# Patient Record
Sex: Male | Born: 1945 | Race: White | Hispanic: No | Smoking: Former smoker
Health system: Southern US, Community
[De-identification: ages and names within clinical notes are randomized; demographics above are authoritative.]

## PROBLEM LIST (undated history)

## (undated) DIAGNOSIS — J449 Chronic obstructive pulmonary disease, unspecified: Secondary | ICD-10-CM

## (undated) DIAGNOSIS — K449 Diaphragmatic hernia without obstruction or gangrene: Secondary | ICD-10-CM

## (undated) DIAGNOSIS — M109 Gout, unspecified: Secondary | ICD-10-CM

## (undated) DIAGNOSIS — I509 Heart failure, unspecified: Secondary | ICD-10-CM

## (undated) DIAGNOSIS — I5189 Other ill-defined heart diseases: Secondary | ICD-10-CM

## (undated) DIAGNOSIS — H9190 Unspecified hearing loss, unspecified ear: Secondary | ICD-10-CM

## (undated) DIAGNOSIS — R32 Unspecified urinary incontinence: Secondary | ICD-10-CM

## (undated) DIAGNOSIS — C8590 Non-Hodgkin lymphoma, unspecified, unspecified site: Secondary | ICD-10-CM

## (undated) DIAGNOSIS — I10 Essential (primary) hypertension: Secondary | ICD-10-CM

## (undated) HISTORY — DX: Unspecified hearing loss, unspecified ear: H91.90

## (undated) HISTORY — DX: Unspecified urinary incontinence: R32

## (undated) HISTORY — DX: Diaphragmatic hernia without obstruction or gangrene: K44.9

## (undated) HISTORY — PX: OTHER SURGICAL HISTORY: SHX169

## (undated) HISTORY — PX: TONSILLECTOMY: SUR1361

## (undated) HISTORY — DX: Heart failure, unspecified: I50.9

---

## 2013-09-18 ENCOUNTER — Emergency Department: Payer: Self-pay | Admitting: Emergency Medicine

## 2013-09-18 LAB — URINALYSIS, COMPLETE
Bacteria: NONE SEEN
Bilirubin,UR: NEGATIVE
GLUCOSE, UR: NEGATIVE mg/dL (ref 0–75)
Ketone: NEGATIVE
LEUKOCYTE ESTERASE: NEGATIVE
Nitrite: NEGATIVE
PH: 5 (ref 4.5–8.0)
Protein: NEGATIVE
RBC,UR: 4 /HPF (ref 0–5)
SQUAMOUS EPITHELIAL: NONE SEEN
Specific Gravity: 1.01 (ref 1.003–1.030)

## 2013-09-18 LAB — CBC
HCT: 39.3 % — ABNORMAL LOW (ref 40.0–52.0)
HGB: 13.8 g/dL (ref 13.0–18.0)
MCH: 31 pg (ref 26.0–34.0)
MCHC: 35.1 g/dL (ref 32.0–36.0)
MCV: 89 fL (ref 80–100)
Platelet: 135 10*3/uL — ABNORMAL LOW (ref 150–440)
RBC: 4.44 10*6/uL (ref 4.40–5.90)
RDW: 13.4 % (ref 11.5–14.5)
WBC: 6.6 10*3/uL (ref 3.8–10.6)

## 2013-09-18 LAB — RAPID INFLUENZA A&B ANTIGENS (ARMC ONLY)

## 2013-09-18 LAB — BASIC METABOLIC PANEL
ANION GAP: 6 — AB (ref 7–16)
BUN: 15 mg/dL (ref 7–18)
Calcium, Total: 9.1 mg/dL (ref 8.5–10.1)
Chloride: 98 mmol/L (ref 98–107)
Co2: 29 mmol/L (ref 21–32)
Creatinine: 1.25 mg/dL (ref 0.60–1.30)
EGFR (Non-African Amer.): 59 — ABNORMAL LOW
Glucose: 97 mg/dL (ref 65–99)
Osmolality: 267 (ref 275–301)
POTASSIUM: 3.1 mmol/L — AB (ref 3.5–5.1)
SODIUM: 133 mmol/L — AB (ref 136–145)

## 2013-09-18 LAB — TROPONIN I

## 2013-09-28 ENCOUNTER — Ambulatory Visit: Payer: Self-pay | Admitting: Internal Medicine

## 2014-08-19 DIAGNOSIS — I10 Essential (primary) hypertension: Secondary | ICD-10-CM | POA: Diagnosis not present

## 2014-08-19 DIAGNOSIS — M1990 Unspecified osteoarthritis, unspecified site: Secondary | ICD-10-CM | POA: Diagnosis not present

## 2014-08-19 DIAGNOSIS — E780 Pure hypercholesterolemia: Secondary | ICD-10-CM | POA: Diagnosis not present

## 2014-08-26 DIAGNOSIS — E780 Pure hypercholesterolemia: Secondary | ICD-10-CM | POA: Diagnosis not present

## 2014-08-26 DIAGNOSIS — I10 Essential (primary) hypertension: Secondary | ICD-10-CM | POA: Diagnosis not present

## 2014-08-26 DIAGNOSIS — M549 Dorsalgia, unspecified: Secondary | ICD-10-CM | POA: Diagnosis not present

## 2014-08-26 DIAGNOSIS — G4700 Insomnia, unspecified: Secondary | ICD-10-CM | POA: Diagnosis not present

## 2014-08-26 DIAGNOSIS — M1990 Unspecified osteoarthritis, unspecified site: Secondary | ICD-10-CM | POA: Diagnosis not present

## 2014-08-26 DIAGNOSIS — E785 Hyperlipidemia, unspecified: Secondary | ICD-10-CM | POA: Diagnosis not present

## 2014-09-19 DIAGNOSIS — M1990 Unspecified osteoarthritis, unspecified site: Secondary | ICD-10-CM | POA: Diagnosis not present

## 2014-09-19 DIAGNOSIS — M11261 Other chondrocalcinosis, right knee: Secondary | ICD-10-CM | POA: Diagnosis not present

## 2014-09-19 DIAGNOSIS — M25461 Effusion, right knee: Secondary | ICD-10-CM | POA: Diagnosis not present

## 2014-09-19 DIAGNOSIS — E780 Pure hypercholesterolemia: Secondary | ICD-10-CM | POA: Diagnosis not present

## 2014-09-19 DIAGNOSIS — I10 Essential (primary) hypertension: Secondary | ICD-10-CM | POA: Diagnosis not present

## 2014-12-23 ENCOUNTER — Other Ambulatory Visit: Payer: Self-pay

## 2014-12-23 DIAGNOSIS — C9140 Hairy cell leukemia not having achieved remission: Secondary | ICD-10-CM

## 2014-12-24 ENCOUNTER — Encounter (INDEPENDENT_AMBULATORY_CARE_PROVIDER_SITE_OTHER): Payer: Self-pay

## 2014-12-24 ENCOUNTER — Inpatient Hospital Stay: Payer: 59

## 2014-12-24 ENCOUNTER — Inpatient Hospital Stay: Payer: 59 | Admitting: Hematology and Oncology

## 2015-03-04 DIAGNOSIS — M159 Polyosteoarthritis, unspecified: Secondary | ICD-10-CM | POA: Diagnosis not present

## 2015-03-04 DIAGNOSIS — F5104 Psychophysiologic insomnia: Secondary | ICD-10-CM | POA: Diagnosis not present

## 2015-03-04 DIAGNOSIS — I10 Essential (primary) hypertension: Secondary | ICD-10-CM | POA: Diagnosis not present

## 2015-03-04 DIAGNOSIS — R21 Rash and other nonspecific skin eruption: Secondary | ICD-10-CM | POA: Diagnosis not present

## 2015-05-29 DIAGNOSIS — K029 Dental caries, unspecified: Secondary | ICD-10-CM | POA: Diagnosis not present

## 2015-09-09 DIAGNOSIS — Z23 Encounter for immunization: Secondary | ICD-10-CM | POA: Diagnosis not present

## 2015-09-09 DIAGNOSIS — F5104 Psychophysiologic insomnia: Secondary | ICD-10-CM | POA: Diagnosis not present

## 2015-09-09 DIAGNOSIS — I10 Essential (primary) hypertension: Secondary | ICD-10-CM | POA: Diagnosis not present

## 2015-09-09 DIAGNOSIS — Z1211 Encounter for screening for malignant neoplasm of colon: Secondary | ICD-10-CM | POA: Diagnosis not present

## 2015-09-09 DIAGNOSIS — M1990 Unspecified osteoarthritis, unspecified site: Secondary | ICD-10-CM | POA: Diagnosis not present

## 2015-09-09 DIAGNOSIS — E7800 Pure hypercholesterolemia, unspecified: Secondary | ICD-10-CM | POA: Diagnosis not present

## 2015-09-09 DIAGNOSIS — Z299 Encounter for prophylactic measures, unspecified: Secondary | ICD-10-CM | POA: Diagnosis not present

## 2015-12-18 DIAGNOSIS — H6123 Impacted cerumen, bilateral: Secondary | ICD-10-CM | POA: Diagnosis not present

## 2015-12-18 DIAGNOSIS — H903 Sensorineural hearing loss, bilateral: Secondary | ICD-10-CM | POA: Diagnosis not present

## 2016-03-24 DIAGNOSIS — F5104 Psychophysiologic insomnia: Secondary | ICD-10-CM | POA: Diagnosis not present

## 2016-03-24 DIAGNOSIS — M1990 Unspecified osteoarthritis, unspecified site: Secondary | ICD-10-CM | POA: Diagnosis not present

## 2016-03-24 DIAGNOSIS — Z299 Encounter for prophylactic measures, unspecified: Secondary | ICD-10-CM | POA: Diagnosis not present

## 2016-03-24 DIAGNOSIS — Z125 Encounter for screening for malignant neoplasm of prostate: Secondary | ICD-10-CM | POA: Diagnosis not present

## 2016-03-24 DIAGNOSIS — E7800 Pure hypercholesterolemia, unspecified: Secondary | ICD-10-CM | POA: Diagnosis not present

## 2016-03-24 DIAGNOSIS — I10 Essential (primary) hypertension: Secondary | ICD-10-CM | POA: Diagnosis not present

## 2016-03-24 DIAGNOSIS — Z23 Encounter for immunization: Secondary | ICD-10-CM | POA: Diagnosis not present

## 2016-03-31 DIAGNOSIS — M1990 Unspecified osteoarthritis, unspecified site: Secondary | ICD-10-CM | POA: Diagnosis not present

## 2016-03-31 DIAGNOSIS — I10 Essential (primary) hypertension: Secondary | ICD-10-CM | POA: Diagnosis not present

## 2016-03-31 DIAGNOSIS — E7800 Pure hypercholesterolemia, unspecified: Secondary | ICD-10-CM | POA: Diagnosis not present

## 2016-03-31 DIAGNOSIS — Z0000 Encounter for general adult medical examination without abnormal findings: Secondary | ICD-10-CM | POA: Diagnosis not present

## 2016-03-31 DIAGNOSIS — F5104 Psychophysiologic insomnia: Secondary | ICD-10-CM | POA: Diagnosis not present

## 2016-06-18 ENCOUNTER — Telehealth: Payer: Self-pay | Admitting: *Deleted

## 2016-06-18 NOTE — Telephone Encounter (Signed)
Opened in error

## 2016-09-24 DIAGNOSIS — I10 Essential (primary) hypertension: Secondary | ICD-10-CM | POA: Diagnosis not present

## 2016-09-24 DIAGNOSIS — F5104 Psychophysiologic insomnia: Secondary | ICD-10-CM | POA: Diagnosis not present

## 2016-09-24 DIAGNOSIS — Z0000 Encounter for general adult medical examination without abnormal findings: Secondary | ICD-10-CM | POA: Diagnosis not present

## 2016-09-24 DIAGNOSIS — E7800 Pure hypercholesterolemia, unspecified: Secondary | ICD-10-CM | POA: Diagnosis not present

## 2016-09-24 DIAGNOSIS — M1990 Unspecified osteoarthritis, unspecified site: Secondary | ICD-10-CM | POA: Diagnosis not present

## 2016-10-04 DIAGNOSIS — I10 Essential (primary) hypertension: Secondary | ICD-10-CM | POA: Diagnosis not present

## 2016-10-04 DIAGNOSIS — E7800 Pure hypercholesterolemia, unspecified: Secondary | ICD-10-CM | POA: Diagnosis not present

## 2016-10-04 DIAGNOSIS — Z0000 Encounter for general adult medical examination without abnormal findings: Secondary | ICD-10-CM | POA: Diagnosis not present

## 2016-10-04 DIAGNOSIS — F5104 Psychophysiologic insomnia: Secondary | ICD-10-CM | POA: Diagnosis not present

## 2016-10-04 DIAGNOSIS — M1990 Unspecified osteoarthritis, unspecified site: Secondary | ICD-10-CM | POA: Diagnosis not present

## 2016-10-12 DIAGNOSIS — M79674 Pain in right toe(s): Secondary | ICD-10-CM | POA: Diagnosis not present

## 2016-10-12 DIAGNOSIS — M2042 Other hammer toe(s) (acquired), left foot: Secondary | ICD-10-CM | POA: Diagnosis not present

## 2016-10-12 DIAGNOSIS — M898X9 Other specified disorders of bone, unspecified site: Secondary | ICD-10-CM | POA: Diagnosis not present

## 2016-10-12 DIAGNOSIS — M79675 Pain in left toe(s): Secondary | ICD-10-CM | POA: Diagnosis not present

## 2016-10-12 DIAGNOSIS — M2041 Other hammer toe(s) (acquired), right foot: Secondary | ICD-10-CM | POA: Diagnosis not present

## 2016-10-12 DIAGNOSIS — B351 Tinea unguium: Secondary | ICD-10-CM | POA: Diagnosis not present

## 2017-02-23 ENCOUNTER — Other Ambulatory Visit: Payer: Self-pay | Admitting: Internal Medicine

## 2017-02-23 ENCOUNTER — Ambulatory Visit
Admission: RE | Admit: 2017-02-23 | Discharge: 2017-02-23 | Disposition: A | Discharge status: Home / Self Care | Payer: Medicare HMO | Source: Ambulatory Visit | Attending: Internal Medicine | Admitting: Internal Medicine

## 2017-02-23 DIAGNOSIS — R42 Dizziness and giddiness: Secondary | ICD-10-CM

## 2017-02-23 DIAGNOSIS — R2681 Unsteadiness on feet: Secondary | ICD-10-CM

## 2017-02-23 DIAGNOSIS — I6782 Cerebral ischemia: Secondary | ICD-10-CM | POA: Diagnosis not present

## 2017-02-23 DIAGNOSIS — R531 Weakness: Secondary | ICD-10-CM

## 2017-02-23 DIAGNOSIS — I10 Essential (primary) hypertension: Secondary | ICD-10-CM | POA: Diagnosis not present

## 2017-02-23 DIAGNOSIS — R739 Hyperglycemia, unspecified: Secondary | ICD-10-CM | POA: Diagnosis not present

## 2017-02-23 DIAGNOSIS — R634 Abnormal weight loss: Secondary | ICD-10-CM | POA: Diagnosis not present

## 2017-02-23 DIAGNOSIS — E7800 Pure hypercholesterolemia, unspecified: Secondary | ICD-10-CM | POA: Diagnosis not present

## 2017-02-23 DIAGNOSIS — R5383 Other fatigue: Secondary | ICD-10-CM | POA: Diagnosis not present

## 2017-02-28 DIAGNOSIS — E876 Hypokalemia: Secondary | ICD-10-CM | POA: Diagnosis not present

## 2017-02-28 DIAGNOSIS — L0291 Cutaneous abscess, unspecified: Secondary | ICD-10-CM | POA: Diagnosis not present

## 2017-03-08 DIAGNOSIS — E876 Hypokalemia: Secondary | ICD-10-CM | POA: Diagnosis not present

## 2017-03-15 DIAGNOSIS — D649 Anemia, unspecified: Secondary | ICD-10-CM | POA: Diagnosis not present

## 2017-03-15 DIAGNOSIS — E538 Deficiency of other specified B group vitamins: Secondary | ICD-10-CM | POA: Diagnosis not present

## 2017-03-21 ENCOUNTER — Ambulatory Visit: Payer: Self-pay | Admitting: Urology

## 2017-04-04 NOTE — Progress Notes (Signed)
04/05/2017 3:47 PM   Waldron Labs Gladman Dec 30, 1945 [id number]  Referring provider: Tracie Harrier, MD [address] [facility], [place] [address]  Chief Complaint  Patient presents with  . New Patient (Initial Visit)    Hematuria referred by Dr. Ginette Pitman    HPI: Patient is a 71 -year-old Caucasian male who presents today as a referral from their PCP, Dr. Ginette Pitman, for microscopic hematuria.    Patient was found to have microscopic hematuria on 02/23/2017 with 4-10 RBC's/hpf.  Urine culture was negative.  Patient doesn't have a prior history of microscopic hematuria.    He does not have a prior history of recurrent urinary tract infections, nephrolithiasis, trauma to the genitourinary tract, BPH or malignancies of the genitourinary tract.   He does not have a family medical history of nephrolithiasis, malignancies of the genitourinary tract or hematuria.   Today, he is having symptoms of nocturia.  His UA today is unremarkable.   He is not experiencing any suprapubic pain, abdominal pain or flank pain. He denies any recent fevers, chills, nausea or vomiting.   He has not had any recent imaging studies.   He is smoker.   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 He was also exposed to agent orange in [place].  He has HTN.     PMH: Past Medical History:  Diagnosis Date  . 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 right knne surgery     needle went through knee    Home Medications:  Allergies as of 04/05/2017   No Known Allergies     Medication List       Accurate as of 04/05/17  3:47 PM. Always use your most recent med list.          allopurinol 100 MG tablet Commonly known as:  ZYLOPRIM Take by mouth.   amLODipine 10 MG tablet Commonly known as:  NORVASC Take by mouth.     etodolac 500 MG tablet Commonly known as:  LODINE Take by mouth.   hydrALAZINE 25 MG tablet Commonly known as:  APRESOLINE Take by mouth.   losartan 100 MG tablet Commonly known as:  COZAAR Take by mouth.   potassium chloride SA 20 MEQ tablet Commonly known as:  K-DUR,KLOR-CON Take by mouth.   pravastatin 80 MG tablet Commonly known as:  PRAVACHOL Take by mouth.   zolpidem 10 MG tablet Commonly known as:  AMBIEN Take by mouth.            Discharge Care Instructions        Start     Ordered   04/05/17 0000  Urinalysis, Complete     04/05/17 1511   04/05/17 0000  CULTURE, URINE COMPREHENSIVE     04/05/17 1511   04/05/17 0000  BUN+Creat     04/05/17 1511   04/05/17 0000  CT HEMATURIA WORKUP    Question Answer Comment  Reason for Exam (SYMPTOM  OR DIAGNOSIS REQUIRED) microscopic hematuria   Preferred imaging location? [facility]      04/05/17 1547      Allergies: No Known Allergies  Family History: Family History  Problem Relation Age of Onset  . Pancreatic cancer Mother   . Aneurysm Mother   . Lung cancer Father   . Prostate cancer Neg  Hx   . Kidney cancer Neg Hx   . Bladder Cancer Neg Hx     Social History:  reports that he has been smoking.  He has never used smokeless tobacco. He reports that he drinks alcohol. He reports that he does not use drugs.  ROS: UROLOGY Frequent Urination?: No Hard to postpone urination?: No Burning/pain with urination?: No Get up at night to urinate?: Yes Leakage of urine?: No Urine stream starts and stops?: No Trouble starting stream?: No Do you have to strain to urinate?: No Blood in urine?: No Urinary tract infection?: No Sexually transmitted disease?: No Injury to kidneys or bladder?: No Painful intercourse?: No Weak stream?: No Erection problems?: No Penile pain?: No  Gastrointestinal Nausea?: No Vomiting?: No Indigestion/heartburn?: No Diarrhea?: No Constipation?:  No  Constitutional Fever: No Night sweats?: No Weight loss?: No Fatigue?: No  Skin Skin rash/lesions?: No Itching?: No  Eyes Blurred vision?: No Double vision?: No  Ears/Nose/Throat Sore throat?: No Sinus problems?: No  Hematologic/Lymphatic Swollen glands?: No Easy bruising?: No  Cardiovascular Leg swelling?: No Chest pain?: No  Respiratory Cough?: No Shortness of breath?: No  Endocrine Excessive thirst?: No  Musculoskeletal Back pain?: No Joint pain?: Yes  Neurological Headaches?: No Dizziness?: No  Psychologic Depression?: No Anxiety?: No  Physical Exam: BP (!) 161/88   Pulse 60   Ht 6\' 1"  (1.854 m)   Wt 225 lb 3.2 oz (102.2 kg)   BMI 29.71 kg/m   Constitutional: Well nourished. Alert and oriented, No acute distress. HEENT: [place] AT, moist mucus membranes. Trachea midline, no masses. Cardiovascular: No clubbing, cyanosis, or edema. Respiratory: Normal respiratory effort, no increased work of breathing. GI: Abdomen is soft, non tender, non distended, no abdominal masses. Liver and spleen not palpable.  No hernias appreciated.  Stool sample for occult testing is not indicated.  Umbilical hernia.   GU: No CVA tenderness.  No bladder fullness or masses.  Patient with circumcised phallus.  Urethral meatus is patent.  No penile discharge. No penile lesions or rashes. Scrotum without lesions, cysts, rashes and/or edema.  Testicles are located scrotally bilaterally. No masses are appreciated in the testicles. Left and right epididymis are normal. Rectal: Patient with  normal sphincter tone. Anus and perineum without scarring or rashes. No rectal masses are appreciated. Prostate is approximately 60 grams, no nodules are appreciated. Seminal vesicles are normal. Skin: No rashes, bruises or suspicious lesions. Lymph: No cervical or inguinal adenopathy. Neurologic: Grossly intact, no focal deficits, moving all 4 extremities. Psychiatric: Normal mood and  affect.  Laboratory Data: PSA  2.48 ng/mL on 03/25/2017    Urinalysis Unremarkable.  See EPIC.    Assessment & Plan:    1. Microscopic hematuria  - I explained to the patient that there are a number of causes that can be associated with blood in the urine, such as stones, BPH, UTI's, damage to the urinary tract and/or cancer.  - At this time, I felt that the patient warranted further urologic evaluation.   The AUA guidelines state that a CT urogram is the preferred imaging study to evaluate hematuria.  - I explained to the patient that a contrast material will be injected into a vein and that in rare instances, an allergic reaction can result and may even life threatening   The patient denies any allergies to contrast, iodine and/or seafood and is not taking metformin.  - Following the imaging study,  I've recommended a cystoscopy. I described how this is performed, typically  in an office setting with a flexible cystoscope. We described the risks, benefits, and possible side effects, the most common of which is a minor amount of blood in the urine and/or burning which usually resolves in 24 to 48 hours. - patient would like to think about  - The patient had the opportunity to ask questions which were answered. Based upon this discussion, the patient is willing to proceed. Therefore, I've ordered: a CT Urogram and cystoscopy.  - The patient will return following all of the above for discussion of the results.   - UA  - Urine culture  - BUN + creatinine     Return for CT Urogram report and cystoscopy.  These notes generated with voice recognition software. I apologize for typographical errors.  Zara Council, [facility] [address] [place], [place] [address] [phone]

## 2017-04-05 ENCOUNTER — Encounter: Payer: Self-pay | Admitting: Urology

## 2017-04-05 ENCOUNTER — Ambulatory Visit (INDEPENDENT_AMBULATORY_CARE_PROVIDER_SITE_OTHER): Payer: Medicare HMO | Admitting: Urology

## 2017-04-05 VITALS — BP 161/88 | HR 60 | Ht 73.0 in | Wt 225.2 lb

## 2017-04-05 DIAGNOSIS — R3129 Other microscopic hematuria: Secondary | ICD-10-CM | POA: Diagnosis not present

## 2017-04-05 LAB — URINALYSIS, COMPLETE
Bilirubin, UA: NEGATIVE
Glucose, UA: NEGATIVE
Ketones, UA: NEGATIVE
Leukocytes, UA: NEGATIVE
Nitrite, UA: NEGATIVE
PH UA: 5.5 (ref 5.0–7.5)
Protein, UA: NEGATIVE
RBC, UA: NEGATIVE
Specific Gravity, UA: 1.015 (ref 1.005–1.030)
UUROB: 0.2 mg/dL (ref 0.2–1.0)

## 2017-04-05 LAB — MICROSCOPIC EXAMINATION
Bacteria, UA: NONE SEEN
EPITHELIAL CELLS (NON RENAL): NONE SEEN /HPF (ref 0–10)

## 2017-04-06 LAB — BUN+CREAT
BUN / CREAT RATIO: 13 (ref 10–24)
BUN: 16 mg/dL (ref 8–27)
CREATININE: 1.22 mg/dL (ref 0.76–1.27)
GFR, EST AFRICAN AMERICAN: 69 mL/min/{1.73_m2} (ref >59)
GFR, EST NON AFRICAN AMERICAN: 59 mL/min/{1.73_m2} — AB (ref >59)

## 2017-04-08 LAB — CULTURE, URINE COMPREHENSIVE

## 2017-04-09 DIAGNOSIS — C8590 Non-Hodgkin lymphoma, unspecified, unspecified site: Secondary | ICD-10-CM

## 2017-04-09 HISTORY — DX: Non-Hodgkin lymphoma, unspecified, unspecified site: C85.90

## 2017-04-20 ENCOUNTER — Ambulatory Visit
Admission: RE | Admit: 2017-04-20 | Discharge: 2017-04-20 | Disposition: A | Discharge status: Home / Self Care | Payer: Medicare HMO | Source: Ambulatory Visit | Attending: Urology | Admitting: Urology

## 2017-04-20 DIAGNOSIS — N1330 Unspecified hydronephrosis: Secondary | ICD-10-CM | POA: Diagnosis not present

## 2017-04-20 DIAGNOSIS — N289 Disorder of kidney and ureter, unspecified: Secondary | ICD-10-CM | POA: Diagnosis not present

## 2017-04-20 DIAGNOSIS — I700 Atherosclerosis of aorta: Secondary | ICD-10-CM | POA: Insufficient documentation

## 2017-04-20 DIAGNOSIS — R3129 Other microscopic hematuria: Secondary | ICD-10-CM

## 2017-04-20 DIAGNOSIS — J90 Pleural effusion, not elsewhere classified: Secondary | ICD-10-CM | POA: Diagnosis not present

## 2017-04-20 DIAGNOSIS — I2510 Atherosclerotic heart disease of native coronary artery without angina pectoris: Secondary | ICD-10-CM | POA: Insufficient documentation

## 2017-04-20 DIAGNOSIS — K8020 Calculus of gallbladder without cholecystitis without obstruction: Secondary | ICD-10-CM | POA: Diagnosis not present

## 2017-04-20 DIAGNOSIS — D3502 Benign neoplasm of left adrenal gland: Secondary | ICD-10-CM | POA: Insufficient documentation

## 2017-04-20 HISTORY — DX: Essential (primary) hypertension: I10

## 2017-04-20 MED ORDER — IOPAMIDOL (ISOVUE-300) INJECTION 61%
125.0000 mL | Freq: Once | INTRAVENOUS | Status: AC | PRN
  Administered 2017-04-20: 125 mL via INTRAVENOUS

## 2017-04-25 ENCOUNTER — Other Ambulatory Visit: Payer: Self-pay

## 2017-05-08 ENCOUNTER — Telehealth: Payer: Self-pay | Admitting: Urology

## 2017-05-08 NOTE — Telephone Encounter (Signed)
I will be trying to contact Mr. John Gallegos first thing Monday am (05/11/2017).  If I am unsuccessful, we need to send him a certified letter.  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  If he contacts the office, he needs to be schedule with one of the physicians A.S.A.P to discuss his CT findings and the next steps.

## 2017-05-09 ENCOUNTER — Telehealth: Payer: Self-pay | Admitting: Urology

## 2017-05-09 NOTE — Telephone Encounter (Signed)
Would you call Dr. Linton Ham office and see if they any other phone numbers for this patient?  The [phone] does not have a voice mail set up.

## 2017-05-09 NOTE — Telephone Encounter (Signed)
We were able to make contact with John Gallegos and I informed him that on his CT urogram there were findings concerning for a left kidney cancer and it was important for him to return for his appointments. He'll be scheduled in our office tomorrow at 2:30 in the afternoon for CT report and cystoscopy.

## 2017-05-09 NOTE — Telephone Encounter (Signed)
Patient was contacted  

## 2017-05-10 ENCOUNTER — Ambulatory Visit (INDEPENDENT_AMBULATORY_CARE_PROVIDER_SITE_OTHER): Payer: Medicare HMO | Admitting: Urology

## 2017-05-10 ENCOUNTER — Other Ambulatory Visit: Payer: Self-pay | Admitting: Radiology

## 2017-05-10 VITALS — BP 151/81 | HR 66 | Ht 73.0 in | Wt 222.8 lb

## 2017-05-10 DIAGNOSIS — R319 Hematuria, unspecified: Secondary | ICD-10-CM | POA: Diagnosis not present

## 2017-05-10 DIAGNOSIS — I10 Essential (primary) hypertension: Secondary | ICD-10-CM | POA: Insufficient documentation

## 2017-05-10 DIAGNOSIS — E785 Hyperlipidemia, unspecified: Secondary | ICD-10-CM | POA: Insufficient documentation

## 2017-05-10 DIAGNOSIS — R3129 Other microscopic hematuria: Secondary | ICD-10-CM

## 2017-05-10 DIAGNOSIS — G4700 Insomnia, unspecified: Secondary | ICD-10-CM | POA: Insufficient documentation

## 2017-05-10 DIAGNOSIS — D4122 Neoplasm of uncertain behavior of left ureter: Secondary | ICD-10-CM

## 2017-05-10 DIAGNOSIS — M1990 Unspecified osteoarthritis, unspecified site: Secondary | ICD-10-CM | POA: Insufficient documentation

## 2017-05-10 LAB — URINALYSIS, COMPLETE
BILIRUBIN UA: NEGATIVE
GLUCOSE, UA: NEGATIVE
Ketones, UA: NEGATIVE
LEUKOCYTES UA: NEGATIVE
Nitrite, UA: NEGATIVE
PROTEIN UA: NEGATIVE
RBC, UA: NEGATIVE
Specific Gravity, UA: 1.01 (ref 1.005–1.030)
Urobilinogen, Ur: 0.2 mg/dL (ref 0.2–1.0)
pH, UA: 7 (ref 5.0–7.5)

## 2017-05-10 MED ORDER — CIPROFLOXACIN HCL 500 MG PO TABS
500.0000 mg | ORAL_TABLET | Freq: Once | ORAL | Status: AC
  Administered 2017-05-10: 500 mg via ORAL

## 2017-05-10 MED ORDER — LIDOCAINE HCL 2 % EX GEL
1.0000 "application " | Freq: Once | CUTANEOUS | Status: AC
  Administered 2017-05-10: 1 via URETHRAL

## 2017-05-10 NOTE — Progress Notes (Signed)
   05/10/17  CC:  Chief Complaint  Patient presents with  . Cysto    HPI:  Follow-up for microscopic hematuria. He underwent a CT scan 04/20/2017 which revealed moderate left hydronephrosis and delayed excretion down to a large 4.4 cm mass in or around the left proximal ureter. There was also hazy soft tissue in the fat surrounding the celiac axis and superior mesenteric artery. Thickening of the right diaphragmatic crus. High gastrohepatic ligament lymphadenopathy measures up to 11 mm. Peripancreatic and root of small bowel mesentery adenopathy measures up to 2.6 cm (series 7, images 26 and 36). The patient does endorse weight loss.   He does not have cancer or Hairy cell leukemia ([place]) -- this is his son. His urine is clear today.   He is smoker.   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 He was also exposed to agent orange in [place].  He has HTN.  Blood pressure (!) 151/81, pulse 66, height 6\' 1"  (1.854 m), weight 101.1 kg (222 lb 12.8 oz). NED. A&Ox3.   No respiratory distress   Abd soft, NT, ND Normal phallus with bilateral descended testicles  Cystoscopy Procedure Note  Patient identification was confirmed, informed consent was obtained, and patient was prepped using Betadine solution.  Lidocaine jelly was administered per urethral meatus.    Preoperative abx where received prior to procedure.     Pre-Procedure: - Inspection reveals a normal caliber ureteral meatus.  Procedure: The flexible cystoscope was introduced without difficulty - No urethral strictures/lesions are present. 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 His urine is clear today. I drew patient a picture the anatomy and discussed we are not sure if this mass is in the ureter or around it. It is causing some hydronephrosis and delayed excretion. We'll plan to do a cystoscopy with left retrograde pyelogram, left ureteroscopy biopsy and stent. We discussed he may need a staged procedure and he may need retroperitoneal biopsy. I discussed one of my colleagues would be doing the procedure and risk of bleeding, infection and ureteral injury among others.   Dr. Ginette Pitman has referred the patient to oncology. If urologic eval is equivocal we can discuss with oncology and IR to determine what might be best to biopsy.

## 2017-05-12 ENCOUNTER — Encounter
Admission: RE | Admit: 2017-05-12 | Discharge: 2017-05-12 | Disposition: A | Discharge status: Home / Self Care | Payer: Medicare HMO | Source: Ambulatory Visit | Attending: Urology | Admitting: Urology

## 2017-05-12 DIAGNOSIS — I10 Essential (primary) hypertension: Secondary | ICD-10-CM | POA: Diagnosis not present

## 2017-05-12 DIAGNOSIS — M1990 Unspecified osteoarthritis, unspecified site: Secondary | ICD-10-CM | POA: Diagnosis not present

## 2017-05-12 DIAGNOSIS — N1330 Unspecified hydronephrosis: Secondary | ICD-10-CM | POA: Diagnosis not present

## 2017-05-12 DIAGNOSIS — Z79899 Other long term (current) drug therapy: Secondary | ICD-10-CM | POA: Diagnosis not present

## 2017-05-12 HISTORY — DX: Gout, unspecified: M10.9

## 2017-05-12 MED ORDER — CEFAZOLIN SODIUM-DEXTROSE 2-4 GM/100ML-% IV SOLN
2.0000 g | INTRAVENOUS | Status: AC
  Administered 2017-05-13: 2 g via INTRAVENOUS

## 2017-05-12 NOTE — Patient Instructions (Signed)
Your procedure is scheduled on: Friday 05/13/17 Report to [facility]. 2ND FLOOR MEDICAL MALL ENTRANCE. To find out your arrival time please call [phone] between 1PM - 3PM on Today.  Remember: Instructions that are not followed completely may result in serious medical risk, up to and including death, or upon the discretion of your surgeon and anesthesiologist your surgery may need to be rescheduled.    __X__ 1. Do not eat anything after midnight the night before your    procedure.  No gum chewing or hard candies.  You may drink clear   liquids up to 2 hours before you are scheduled to arrive at the   hospital for your procedure. Do not drink clear liquids within 2   hours of scheduled arrival to the hospital as this may lead to your   procedure being delayed or rescheduled.       Clear liquids include:   Water or Apple juice without pulp   Clear carbohydrate beverage such as Clearfast or Gatorade   Black coffee or Clear Tea (no milk, no creamer, do not add anything   to the coffee or tea)    Diabetics should only drink water   __X__ 2. No Alcohol for 24 hours before or after surgery.   ____ 3. Bring all medications with you on the day of surgery if instructed.    __X__ 4. Notify your doctor if there is any change in your medical condition     (cold, fever, infections).             __X___5. No smoking within 24 hours of your surgery.     Do not wear jewelry, make-up, hairpins, clips or nail polish.  Do not wear lotions, powders, or perfumes.   Do not shave 48 hours prior to surgery. Men may shave face and neck.  Do not bring valuables to the hospital.    [facility] is not responsible for any belongings or valuables.               Contacts, dentures or bridgework may not be worn into surgery.  Leave your suitcase in the car. After surgery it may be brought to your room.  For patients admitted to the hospital, discharge time is determined by your                treatment  team.   Patients discharged the day of surgery will not be allowed to drive home.   Please read over the following fact sheets that you were given:   MRSA Information   __x__ Take these medicines the morning of surgery with A SIP OF WATER:    1. None, take normal evening medications  2.   3.   4.  5.  6.  ____ Fleet Enema (as directed)   ____ Use CHG Soap/SAGE wipes as directed  ____ Use inhalers on the day of surgery  ____ Stop metformin 2 days prior to surgery    ____ Take 1/2 of usual insulin dose the night before surgery and none on the morning of surgery.   __x__ Stop Coumadin/Plavix/aspirin already stopped   __X__ Stop Anti-inflammatories such as Advil, Aleve, Ibuprofen, Motrin, Naproxen, Naprosyn, Goodies,powder, or aspirin products.  OK to take Tylenol.   __X__ Stop supplements, Vitamin E, Fish Oil until after surgery.    ____ Bring C-Pap to the hospital.

## 2017-05-13 ENCOUNTER — Encounter
Admission: RE | Disposition: A | Discharge status: Home / Self Care | Payer: Self-pay | Source: Ambulatory Visit | Attending: Urology

## 2017-05-13 ENCOUNTER — Ambulatory Visit: Payer: Medicare HMO | Admitting: Registered Nurse

## 2017-05-13 ENCOUNTER — Telehealth: Payer: Self-pay | Admitting: Oncology

## 2017-05-13 ENCOUNTER — Ambulatory Visit
Admission: RE | Admit: 2017-05-13 | Discharge: 2017-05-13 | Disposition: A | Discharge status: Home / Self Care | Payer: Medicare HMO | Source: Ambulatory Visit | Attending: Urology | Admitting: Urology

## 2017-05-13 ENCOUNTER — Encounter: Payer: Self-pay | Admitting: *Deleted

## 2017-05-13 DIAGNOSIS — M1990 Unspecified osteoarthritis, unspecified site: Secondary | ICD-10-CM | POA: Diagnosis not present

## 2017-05-13 DIAGNOSIS — N1330 Unspecified hydronephrosis: Secondary | ICD-10-CM | POA: Insufficient documentation

## 2017-05-13 DIAGNOSIS — Z79899 Other long term (current) drug therapy: Secondary | ICD-10-CM | POA: Insufficient documentation

## 2017-05-13 DIAGNOSIS — I10 Essential (primary) hypertension: Secondary | ICD-10-CM | POA: Diagnosis not present

## 2017-05-13 DIAGNOSIS — D4122 Neoplasm of uncertain behavior of left ureter: Secondary | ICD-10-CM

## 2017-05-13 DIAGNOSIS — D4959 Neoplasm of unspecified behavior of other genitourinary organ: Secondary | ICD-10-CM | POA: Diagnosis not present

## 2017-05-13 HISTORY — PX: URETEROSCOPY: SHX842

## 2017-05-13 HISTORY — PX: CYSTOSCOPY WITH STENT PLACEMENT: SHX5790

## 2017-05-13 HISTORY — PX: CYSTOSCOPY WITH BIOPSY: SHX5122

## 2017-05-13 SURGERY — CYSTOSCOPY, WITH BIOPSY
Anesthesia: General | Laterality: Left | Wound class: Clean Contaminated

## 2017-05-13 MED ORDER — ACETAMINOPHEN 10 MG/ML IV SOLN
INTRAVENOUS | Status: DC | PRN
  Administered 2017-05-13: 1000 mg via INTRAVENOUS

## 2017-05-13 MED ORDER — OXYCODONE HCL 5 MG PO TABS: 5.0000 mg | ORAL_TABLET | Freq: Once | ORAL | Status: DC | PRN

## 2017-05-13 MED ORDER — LIDOCAINE HCL (PF) 2 % IJ SOLN
INTRAMUSCULAR | Status: AC
  Filled 2017-05-13: qty 10

## 2017-05-13 MED ORDER — MIDAZOLAM HCL 2 MG/2ML IJ SOLN
INTRAMUSCULAR | Status: DC | PRN
  Administered 2017-05-13: 2 mg via INTRAVENOUS

## 2017-05-13 MED ORDER — EPHEDRINE SULFATE 50 MG/ML IJ SOLN
INTRAMUSCULAR | Status: AC
  Filled 2017-05-13: qty 1

## 2017-05-13 MED ORDER — ONDANSETRON HCL 4 MG/2ML IJ SOLN
INTRAMUSCULAR | Status: DC | PRN
  Administered 2017-05-13: 4 mg via INTRAVENOUS

## 2017-05-13 MED ORDER — PROPOFOL 10 MG/ML IV BOLUS
INTRAVENOUS | Status: AC
  Filled 2017-05-13: qty 20

## 2017-05-13 MED ORDER — PROPOFOL 10 MG/ML IV BOLUS
INTRAVENOUS | Status: DC | PRN
  Administered 2017-05-13: 180 mg via INTRAVENOUS

## 2017-05-13 MED ORDER — HYDROCODONE-ACETAMINOPHEN 5-325 MG PO TABS: 1.0000 | ORAL_TABLET | ORAL | 0 refills | Status: DC | PRN

## 2017-05-13 MED ORDER — MEPERIDINE HCL 50 MG/ML IJ SOLN: 6.2500 mg | INTRAMUSCULAR | Status: DC | PRN

## 2017-05-13 MED ORDER — CEPHALEXIN 500 MG PO CAPS: 500.0000 mg | ORAL_CAPSULE | Freq: Three times a day (TID) | ORAL | 0 refills | Status: DC

## 2017-05-13 MED ORDER — OXYCODONE HCL 5 MG/5ML PO SOLN: 5.0000 mg | Freq: Once | ORAL | Status: DC | PRN

## 2017-05-13 MED ORDER — GLYCOPYRROLATE 0.2 MG/ML IJ SOLN
INTRAMUSCULAR | Status: DC | PRN
  Administered 2017-05-13: 0.2 mg via INTRAVENOUS

## 2017-05-13 MED ORDER — CEFAZOLIN SODIUM-DEXTROSE 2-4 GM/100ML-% IV SOLN
INTRAVENOUS | Status: AC
  Filled 2017-05-13: qty 100

## 2017-05-13 MED ORDER — MIDAZOLAM HCL 2 MG/2ML IJ SOLN
INTRAMUSCULAR | Status: AC
  Filled 2017-05-13: qty 2

## 2017-05-13 MED ORDER — ONDANSETRON HCL 4 MG/2ML IJ SOLN
INTRAMUSCULAR | Status: AC
  Filled 2017-05-13: qty 2

## 2017-05-13 MED ORDER — FENTANYL CITRATE (PF) 100 MCG/2ML IJ SOLN: 25.0000 ug | INTRAMUSCULAR | Status: DC | PRN

## 2017-05-13 MED ORDER — FENTANYL CITRATE (PF) 100 MCG/2ML IJ SOLN
INTRAMUSCULAR | Status: DC | PRN
  Administered 2017-05-13 (×2): 25 ug via INTRAVENOUS
  Administered 2017-05-13: 50 ug via INTRAVENOUS

## 2017-05-13 MED ORDER — LIDOCAINE HCL (CARDIAC) 20 MG/ML IV SOLN
INTRAVENOUS | Status: DC | PRN
  Administered 2017-05-13: 100 mg via INTRAVENOUS

## 2017-05-13 MED ORDER — LACTATED RINGERS IV SOLN
INTRAVENOUS | Status: DC
  Administered 2017-05-13: 11:00:00 via INTRAVENOUS

## 2017-05-13 MED ORDER — ACETAMINOPHEN 10 MG/ML IV SOLN
INTRAVENOUS | Status: AC
  Filled 2017-05-13: qty 100

## 2017-05-13 MED ORDER — DEXAMETHASONE SODIUM PHOSPHATE 10 MG/ML IJ SOLN
INTRAMUSCULAR | Status: DC | PRN
  Administered 2017-05-13: 10 mg via INTRAVENOUS

## 2017-05-13 MED ORDER — GLYCOPYRROLATE 0.2 MG/ML IJ SOLN
INTRAMUSCULAR | Status: AC
  Filled 2017-05-13: qty 1

## 2017-05-13 MED ORDER — FAMOTIDINE 20 MG PO TABS
ORAL_TABLET | ORAL | Status: AC
  Filled 2017-05-13: qty 1

## 2017-05-13 MED ORDER — EPHEDRINE SULFATE 50 MG/ML IJ SOLN
INTRAMUSCULAR | Status: DC | PRN
  Administered 2017-05-13: 10 mg via INTRAVENOUS

## 2017-05-13 MED ORDER — FENTANYL CITRATE (PF) 100 MCG/2ML IJ SOLN
INTRAMUSCULAR | Status: AC
  Filled 2017-05-13: qty 2

## 2017-05-13 MED ORDER — DEXAMETHASONE SODIUM PHOSPHATE 10 MG/ML IJ SOLN
INTRAMUSCULAR | Status: AC
  Filled 2017-05-13: qty 1

## 2017-05-13 MED ORDER — FAMOTIDINE 20 MG PO TABS
20.0000 mg | ORAL_TABLET | Freq: Once | ORAL | Status: AC
  Administered 2017-05-13: 20 mg via ORAL

## 2017-05-13 MED ORDER — PROMETHAZINE HCL 25 MG/ML IJ SOLN: 6.2500 mg | INTRAMUSCULAR | Status: DC | PRN

## 2017-05-13 SURGICAL SUPPLY — 32 items
BACTOSHIELD CHG 4% 4OZ (MISCELLANEOUS) ×1
BASKET ZERO TIP 1.9FR (BASKET) IMPLANT
CATH URETL 5X70 OPEN END (CATHETERS) ×2 IMPLANT
CNTNR SPEC 2.5X3XGRAD LEK (MISCELLANEOUS) ×1
CONT SPEC 4OZ STER OR WHT (MISCELLANEOUS) ×1
CONTAINER SPEC 2.5X3XGRAD LEK (MISCELLANEOUS) ×1 IMPLANT
FORCEPS BIOP PIRANHA Y (CUTTING FORCEPS) ×2 IMPLANT
GLOVE BIO SURGEON STRL SZ7 (GLOVE) ×4 IMPLANT
GLOVE BIO SURGEON STRL SZ7.5 (GLOVE) ×2 IMPLANT
GOWN L4 LG 24 PK N/S (GOWN DISPOSABLE) ×2 IMPLANT
GOWN STRL REUS W/ TWL LRG LVL4 (GOWN DISPOSABLE) ×1 IMPLANT
GOWN STRL REUS W/ TWL XL LVL3 (GOWN DISPOSABLE) ×1 IMPLANT
GOWN STRL REUS W/TWL LRG LVL4 (GOWN DISPOSABLE) ×1
GOWN STRL REUS W/TWL XL LVL3 (GOWN DISPOSABLE) ×1
GOWN STRL REUS W/TWL XL LVL4 (GOWN DISPOSABLE) ×2 IMPLANT
GUIDEWIRE SUPER STIFF (WIRE) IMPLANT
INTRODUCER DILATOR DOUBLE (INTRODUCER) ×2 IMPLANT
KIT RM TURNOVER CYSTO AR (KITS) ×2 IMPLANT
PACK CYSTO AR (MISCELLANEOUS) ×2 IMPLANT
SCRUB CHG 4% DYNA-HEX 4OZ (MISCELLANEOUS) ×1 IMPLANT
SENSORWIRE 0.038 NOT ANGLED (WIRE) ×2
SET CYSTO W/LG BORE CLAMP LF (SET/KITS/TRAYS/PACK) ×2 IMPLANT
SHEATH URETERAL 13/15X36 1L (SHEATH) IMPLANT
SOL .9 NS 3000ML IRR  AL (IV SOLUTION) ×1
SOL .9 NS 3000ML IRR UROMATIC (IV SOLUTION) ×1 IMPLANT
STENT URET 6FRX24 CONTOUR (STENTS) IMPLANT
STENT URET 6FRX26 CONTOUR (STENTS) IMPLANT
STENT URET 6FRX28 CONTOUR (STENTS) ×2 IMPLANT
SURGILUBE 2OZ TUBE FLIPTOP (MISCELLANEOUS) ×2 IMPLANT
SYRINGE IRR TOOMEY STRL 70CC (SYRINGE) ×2 IMPLANT
WATER STERILE IRR 1000ML POUR (IV SOLUTION) ×2 IMPLANT
WIRE SENSOR 0.038 NOT ANGLED (WIRE) ×1 IMPLANT

## 2017-05-13 NOTE — Anesthesia Procedure Notes (Signed)
Procedure Name: LMA Insertion Date/Time: 05/13/2017 12:13 PM Performed by: Doreen Salvage Pre-anesthesia Checklist: Patient identified, Patient being monitored, Timeout performed, Emergency Drugs available and Suction available Patient Re-evaluated:Patient Re-evaluated prior to induction Oxygen Delivery Method: Circle system utilized Preoxygenation: Pre-oxygenation with 100% oxygen Induction Type: IV induction Ventilation: Mask ventilation without difficulty LMA: LMA inserted LMA Size: 4.5 Tube type: Oral Number of attempts: 1 Placement Confirmation: positive ETCO2 and breath sounds checked- equal and bilateral Tube secured with: Tape Dental Injury: Teeth and Oropharynx as per pre-operative assessment

## 2017-05-13 NOTE — Anesthesia Postprocedure Evaluation (Signed)
Anesthesia Post Note  Patient: John Gallegos  Procedure(s) Performed: CYSTOSCOPY WITH URETERAL BIOPSY (Left ) CYSTOSCOPY WITH STENT PLACEMENT (Left ) URETEROSCOPY (Left )  Patient location during evaluation: PACU Anesthesia Type: General Level of consciousness: awake and alert and oriented Pain management: pain level controlled Vital Signs Assessment: post-procedure vital signs reviewed and stable Respiratory status: spontaneous breathing, nonlabored ventilation and respiratory function stable Cardiovascular status: blood pressure returned to baseline and stable Postop Assessment: no signs of nausea or vomiting Anesthetic complications: no     Last Vitals:  Vitals:   05/13/17 1341 05/13/17 1403  BP: 130/78 134/77  Pulse: (!) 52 (!) 51  Resp: 16 16  Temp: (!) 36.2 C 36.6 C  SpO2: 96% 96%    Last Pain:  Vitals:   05/13/17 1403  TempSrc: Temporal  PainSc:                  Chany Woolworth

## 2017-05-13 NOTE — Interval H&P Note (Signed)
History and Physical Interval Note:  05/13/2017 11:50 AM  John Gallegos  has presented today for surgery, with the diagnosis of Left ureteral neoplasm  The various methods of treatment have been discussed with the patient and family. After consideration of risks, benefits and other options for treatment, the patient has consented to  Procedure(s): CYSTOSCOPY WITH URETERAL BIOPSY (Left) CYSTOSCOPY WITH STENT PLACEMENT (Left) URETEROSCOPY (Left) as a surgical intervention .  The patient's history has been reviewed, patient examined, no change in status, stable for surgery.  I have reviewed the patient's chart and labs.  Questions were answered to the patient's satisfaction.    RRR Lungs clear  Nickie Retort

## 2017-05-13 NOTE — H&P (View-Only) (Signed)
   05/10/17  CC:  Chief Complaint  Patient presents with  . Cysto    HPI:  Follow-up for microscopic hematuria. He underwent a CT scan 04/20/2017 which revealed moderate left hydronephrosis and delayed excretion down to a large 4.4 cm mass in or around the left proximal ureter. There was also hazy soft tissue in the fat surrounding the celiac axis and superior mesenteric artery. Thickening of the right diaphragmatic crus. High gastrohepatic ligament lymphadenopathy measures up to 11 mm. Peripancreatic and root of small bowel mesentery adenopathy measures up to 2.6 cm (series 7, images 26 and 36). The patient does endorse weight loss.   He does not have cancer or Hairy cell leukemia ([place]) -- this is his son. His urine is clear today.   He is smoker.   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 He was also exposed to agent orange in [place].  He has HTN.  Blood pressure (!) 151/81, pulse 66, height 6\' 1"  (1.854 m), weight 101.1 kg (222 lb 12.8 oz). NED. A&Ox3.   No respiratory distress   Abd soft, NT, ND Normal phallus with bilateral descended testicles  Cystoscopy Procedure Note  Patient identification was confirmed, informed consent was obtained, and patient was prepped using Betadine solution.  Lidocaine jelly was administered per urethral meatus.    Preoperative abx where received prior to procedure.     Pre-Procedure: - Inspection reveals a normal caliber ureteral meatus.  Procedure: The flexible cystoscope was introduced without difficulty - No urethral strictures/lesions are present. 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 His urine is clear today. I drew patient a picture the anatomy and discussed we are not sure if this mass is in the ureter or around it. It is causing some hydronephrosis and delayed excretion. We'll plan to do a cystoscopy with left retrograde pyelogram, left ureteroscopy biopsy and stent. We discussed he may need a staged procedure and he may need retroperitoneal biopsy. I discussed one of my colleagues would be doing the procedure and risk of bleeding, infection and ureteral injury among others.   Dr. Ginette Pitman has referred the patient to oncology. If urologic eval is equivocal we can discuss with oncology and IR to determine what might be best to biopsy.

## 2017-05-13 NOTE — Anesthesia Preprocedure Evaluation (Addendum)
Anesthesia Evaluation  Patient identified by MRN, date of birth, ID band Patient awake    Reviewed: Allergy & Precautions, NPO status , Patient's Chart, lab work & pertinent test results  History of Anesthesia Complications Negative for: history of anesthetic complications  Airway Mallampati: II  TM Distance: >3 FB Neck ROM: Full    Dental  (+) Poor Dentition, Chipped   Pulmonary neg sleep apnea, neg COPD, Current Smoker,    breath sounds clear to auscultation- rhonchi (-) wheezing      Cardiovascular Exercise Tolerance: Good hypertension, Pt. on medications (-) CAD, (-) Past MI and (-) Cardiac Stents  Rhythm:Regular Rate:Normal - Systolic murmurs and - Diastolic murmurs    Neuro/Psych negative neurological ROS  negative psych ROS   GI/Hepatic negative GI ROS, Neg liver ROS,   Endo/Other  negative endocrine ROSneg diabetes  Renal/GU Renal InsufficiencyRenal disease (ureteral mass)     Musculoskeletal  (+) Arthritis ,   Abdominal (+) - obese,   Peds  Hematology negative hematology ROS (+)   Anesthesia Other Findings Past Medical History: No date: Gout No date: Hypertension   Reproductive/Obstetrics                             Anesthesia Physical Anesthesia Plan  ASA: II  Anesthesia Plan: General   Post-op Pain Management:    Induction: Intravenous  PONV Risk Score and Plan: 0 and Ondansetron  Airway Management Planned: LMA  Additional Equipment:   Intra-op Plan:   Post-operative Plan: Extubation in OR  Informed Consent: I have reviewed the patients History and Physical, chart, labs and discussed the procedure including the risks, benefits and alternatives for the proposed anesthesia with the patient or authorized representative who has indicated his/her understanding and acceptance.   Dental advisory given  Plan Discussed with: CRNA and Anesthesiologist  Anesthesia  Plan Comments:        Anesthesia Quick Evaluation

## 2017-05-13 NOTE — Op Note (Signed)
Date of procedure: 05/13/17  Preoperative diagnosis:  1. Left hydronephrosis 2. Intrinsic versus extrinsic compression of left ureter   Postoperative diagnosis:  1. Left hydronephrosis 2. Extrinsic compression of left ureter   Procedure: 1. Cystoscopy 2. Left retrograde pyelogram with interpretation 3. Left ureteroscopy 4. Left ureteral stent placement 6 French by 28 cm  Surgeon: Baruch Gouty, MD  Anesthesia: General  Complications: None  Intraoperative findings: The patient had narrowing of the proximal ureter on left retrograde pyelogram with proximal hydroureteronephrosis. Ureteroscopy revealed no mass within the left ureter ruling out intrinsic compression as a source. 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  EBL: None  Specimens: None  Drains: 6 French by 28 cm left double-J ureteral stent  Disposition: Stable to the postanesthesia care unit  Indication for procedure: The patient is a 71 y.o. male with history of microscopic hematuria who underwent evaluation for this which then showed left hydroureteronephrosis. There was a mass surrounding the proximal left ureter and extending around local arteries and veins. It is unclear if it originate from the ureter originated from the retroperitoneum. 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 He presents today for further evaluation..  After reviewing the management options for treatment, the patient elected to proceed with the above surgical procedure(s). We have discussed the potential benefits and risks of the procedure, side effects of the proposed treatment, the likelihood of the patient achieving the goals of the procedure, and any potential problems that might occur during the procedure or recuperation. Informed consent has been obtained.  Description of procedure: The patient was met in the preoperative area. All risks, benefits, and indications of the procedure were  described in great detail. The patient consented to the procedure. Preoperative antibiotics were given. The patient was taken to the operative theater. General anesthesia was induced per the anesthesia service. The patient was then placed in the dorsal lithotomy position and prepped and draped in the usual sterile fashion. A preoperative timeout was called.   A 21 French 30 cystoscope was inserted into the patient's bladder per urethra atraumatically. A left retrograde pyelogram was performed with findings consistent with the CT scan. There was narrowing of the left proximal ureter with hydroureteronephrosis proximal to that narrowing. A sensor wire was advanced level of the left renal pelvis under fluoroscopy. A semirigid ureteroscope was introduced into the left ureter. The semirigid ureteroscope was easily navigated to the left renal pelvis. There were no masses or stricture within the left ureter. There is no sign of any malignancy or other abnormality within the left ureter particularly within the area that was narrowed. At this time as determined that the compression was extrinsic and not ureteral in origin. The ureteroscope was withdrawn. A 6 French by 28 cm left double-J ureteral stent was then placed with the aid of the cystoscope. The sensor wire was removed. A curl seen in the patient's left renal pelvis under fluoroscopy and in the urinary bladder under direct visualization. This point the patient's bladder was drained and was woke from anesthesia and transferred in stable condition to the postanesthesia care unit.  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 We'll arrange for him to see both medical oncology as well as for him to undergo CT-guided biopsy by interventional radiology. He will need a stent exchange in 3 months or stent removal if there is resolution of the extrinsic compression with eventual appropriate treatment of the unknown cause  Baruch Gouty, M.D.

## 2017-05-13 NOTE — OR Nursing (Signed)
Pt c/o "gas pain, like I gotta have a BM", called Dr. Pilar Jarvis, advised could order B&O if pt w/o hx of glaucoma.  Discussed with pt, "it's getting better, I don't want it."

## 2017-05-13 NOTE — Discharge Instructions (Signed)

## 2017-05-13 NOTE — Anesthesia Post-op Follow-up Note (Signed)
Anesthesia QCDR form completed.        

## 2017-05-13 NOTE — Transfer of Care (Signed)
Immediate Anesthesia Transfer of Care Note  Patient: Calil Amor Ardizzone  Procedure(s) Performed: Procedure(s): CYSTOSCOPY WITH URETERAL BIOPSY (Left) CYSTOSCOPY WITH STENT PLACEMENT (Left) URETEROSCOPY (Left)  Patient Location: PACU  Anesthesia Type:General  Level of Consciousness: sedated  Airway & Oxygen Therapy: Patient Spontanous Breathing and Patient connected to face mask oxygen  Post-op Assessment: Report given to RN and Post -op Vital signs reviewed and stable  Post vital signs: Reviewed and stable  Last Vitals:  Vitals:   05/13/17 1024 05/13/17 1245  BP: (!) 145/85 (!) 104/57  Pulse: 61 (!) 51  Resp: 16 13  Temp: (!) 36.3 C (!) 36.3 C  SpO2: 95% 09%    Complications: No apparent anesthesia complications

## 2017-05-13 NOTE — Telephone Encounter (Signed)
New Pt Ref for Kidney Mass. No VM setup to leave message for pt. Will cal back on Monday morning.

## 2017-05-17 ENCOUNTER — Other Ambulatory Visit: Payer: Self-pay | Admitting: Urology

## 2017-05-18 ENCOUNTER — Encounter: Payer: Self-pay | Admitting: Oncology

## 2017-05-18 ENCOUNTER — Telehealth: Payer: Self-pay

## 2017-05-18 ENCOUNTER — Inpatient Hospital Stay: Payer: Medicare HMO | Attending: Oncology | Admitting: Oncology

## 2017-05-18 ENCOUNTER — Inpatient Hospital Stay: Payer: Medicare HMO

## 2017-05-18 VITALS — BP 144/68 | HR 60 | Temp 97.5°F | Resp 18 | Ht 72.0 in | Wt 223.5 lb

## 2017-05-18 DIAGNOSIS — H9190 Unspecified hearing loss, unspecified ear: Secondary | ICD-10-CM | POA: Diagnosis not present

## 2017-05-18 DIAGNOSIS — R948 Abnormal results of function studies of other organs and systems: Secondary | ICD-10-CM | POA: Insufficient documentation

## 2017-05-18 DIAGNOSIS — M109 Gout, unspecified: Secondary | ICD-10-CM | POA: Diagnosis not present

## 2017-05-18 DIAGNOSIS — N2889 Other specified disorders of kidney and ureter: Secondary | ICD-10-CM | POA: Diagnosis not present

## 2017-05-18 DIAGNOSIS — R32 Unspecified urinary incontinence: Secondary | ICD-10-CM | POA: Insufficient documentation

## 2017-05-18 DIAGNOSIS — R634 Abnormal weight loss: Secondary | ICD-10-CM | POA: Diagnosis not present

## 2017-05-18 DIAGNOSIS — N1330 Unspecified hydronephrosis: Secondary | ICD-10-CM | POA: Diagnosis not present

## 2017-05-18 DIAGNOSIS — R599 Enlarged lymph nodes, unspecified: Secondary | ICD-10-CM | POA: Diagnosis not present

## 2017-05-18 DIAGNOSIS — I10 Essential (primary) hypertension: Secondary | ICD-10-CM | POA: Diagnosis not present

## 2017-05-18 DIAGNOSIS — F17210 Nicotine dependence, cigarettes, uncomplicated: Secondary | ICD-10-CM | POA: Diagnosis not present

## 2017-05-18 DIAGNOSIS — Z79899 Other long term (current) drug therapy: Secondary | ICD-10-CM | POA: Diagnosis not present

## 2017-05-18 DIAGNOSIS — R3129 Other microscopic hematuria: Secondary | ICD-10-CM

## 2017-05-18 LAB — COMPREHENSIVE METABOLIC PANEL
ALBUMIN: 4 g/dL (ref 3.5–5.0)
ALT: 11 U/L — AB (ref 17–63)
AST: 15 U/L (ref 15–41)
Alkaline Phosphatase: 92 U/L (ref 38–126)
Anion gap: 9 (ref 5–15)
BUN: 16 mg/dL (ref 6–20)
CHLORIDE: 103 mmol/L (ref 101–111)
CO2: 28 mmol/L (ref 22–32)
CREATININE: 0.99 mg/dL (ref 0.61–1.24)
Calcium: 9.4 mg/dL (ref 8.9–10.3)
GFR calc non Af Amer: >60 mL/min (ref >60)
GLUCOSE: 106 mg/dL — AB (ref 65–99)
Potassium: 3.3 mmol/L — ABNORMAL LOW (ref 3.5–5.1)
SODIUM: 140 mmol/L (ref 135–145)
Total Bilirubin: 0.7 mg/dL (ref 0.3–1.2)
Total Protein: 7.6 g/dL (ref 6.5–8.1)

## 2017-05-18 LAB — CBC WITH DIFFERENTIAL/PLATELET
BASOS ABS: 0.1 10*3/uL (ref 0–0.1)
BASOS PCT: 1 %
EOS ABS: 0.2 10*3/uL (ref 0–0.7)
EOS PCT: 3 %
HCT: 40.4 % (ref 40.0–52.0)
Hemoglobin: 13.9 g/dL (ref 13.0–18.0)
Lymphocytes Relative: 8 %
Lymphs Abs: 0.6 10*3/uL — ABNORMAL LOW (ref 1.0–3.6)
MCH: 30.2 pg (ref 26.0–34.0)
MCHC: 34.5 g/dL (ref 32.0–36.0)
MCV: 87.5 fL (ref 80.0–100.0)
Monocytes Absolute: 0.6 10*3/uL (ref 0.2–1.0)
Monocytes Relative: 9 %
NEUTROS PCT: 79 %
Neutro Abs: 5.8 10*3/uL (ref 1.4–6.5)
PLATELETS: 211 10*3/uL (ref 150–440)
RBC: 4.62 MIL/uL (ref 4.40–5.90)
RDW: 14.2 % (ref 11.5–14.5)
WBC: 7.2 10*3/uL (ref 3.8–10.6)

## 2017-05-18 LAB — URIC ACID: Uric Acid, Serum: 6.6 mg/dL (ref 4.4–7.6)

## 2017-05-18 LAB — LACTATE DEHYDROGENASE: LDH: 116 U/L (ref 98–192)

## 2017-05-18 NOTE — Progress Notes (Signed)
Patient here today as a new patient with kidney mass

## 2017-05-18 NOTE — Progress Notes (Signed)
Hematology/Oncology Consult note [facility] [phone] Fax:([phone]   Patient Care Team: Tracie Harrier, MD as PCP - General (Internal Medicine)  REFERRING PROVIDER: Tracie Harrier, MD CHIEF COMPLAINTS/PURPOSE OF CONSULTATION:  Kidney mass  HISTORY OF PRESENTING ILLNESS:  John Gallegos is a  71 y.o.  male with PMH listed below who was referred by Dr.Hande to me for evaluation of kidney mass.  Patient was found to have microscopic hematuria on 02/23/2017 with 4-10 RBC's/hpf.    Urine culture was negative.having symptoms of nocturia He is smoker. 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 He was in the [employer] and reports to be exposed to [place] in [place].  CT scan 04/20/2017 which revealed moderate left hydronephrosis and delayed excretion down to a large 4.4 cm mass in or around the left proximal ureter. There was also hazy soft tissue in the fat surrounding the celiac axis and superior mesenteric artery. Thickening of the right diaphragmatic crus. High gastrohepatic ligament lymphadenopathy measures up to 11 mm. Peripancreatic and root of small bowel mesentery adenopathy measures up to 2.6 cm.  He was seen by urologist Dr. Pilar Jarvis and he underwent cystoscopy, left retrograde pyelogram, left ureteroscopy and placement of left ureteral stent placement on 05/13/2017. Finding during the procedure that he has extrinsic compression of the ureter, possibly from lymphoma.  Patient admits to unintentional weight loss.   His son has hairy cell leukemia.   Review of Systems  Constitutional: Negative for fever, night sweats, change in appetite. (+) unintentional weight loss, HENT: Negative for ear pain, hearing loss, nasal bleeding Eyes: Negative for eye pain, double vision   Respiratory: Negative for wheezing, shortness of breath, cough Cardiovascular: Negative for chest pain, palpitation.   Gastrointestinal: Negative abdominal  pain, diarrhea, nausea vomiting Endocrine: Negative  Genitourinary: Negative for dysuria, hematuria, frequency Musculoskeletal: chronic back pain.  Skin: Negative for rash, iching, bruising Neurological: Negative for headache, dizziness, seizure Hematological: Negative for easy bruising/bleeding, lymph node enlargement Psychiatric/Behavioral: Negative for depression, anxiety, suicidality MEDICAL HISTORY:  Past Medical History:  Diagnosis Date  . Gout   . Hard of hearing   . Hypertension   . Urinary incontinence     SURGICAL HISTORY: Past Surgical History:  Procedure Laterality Date  . CYSTOSCOPY WITH BIOPSY Left 05/13/2017   Procedure: CYSTOSCOPY WITH URETERAL BIOPSY;  Surgeon: Nickie Retort, MD;  Location: [facility] ORS;  Service: Urology;  Laterality: Left;  . CYSTOSCOPY WITH STENT PLACEMENT Left 05/13/2017   Procedure: CYSTOSCOPY WITH STENT PLACEMENT;  Surgeon: Nickie Retort, MD;  Location: [facility] ORS;  Service: Urology;  Laterality: Left;  . 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 right knne surgery     needle went through knee  . TONSILLECTOMY    . URETEROSCOPY Left 05/13/2017   Procedure: URETEROSCOPY;  Surgeon: Nickie Retort, MD;  Location: [facility] ORS;  Service: Urology;  Laterality: Left;    SOCIAL HISTORY: Social History   Social History  . Marital status: Married    Spouse name: N/A  . Number of children: N/A  . Years of education: N/A   Occupational History  . Not on file.   Social History Main Topics  . Smoking status: Current Every Day Smoker    Packs/day: 0.50    Years: 50.00    Types: Cigarettes  . Smokeless tobacco: Never Used  . Alcohol use 1.8 oz/week    3 Shots of liquor per week  . Drug use: No  .  Sexual activity: Not on file   Other Topics Concern  . Not on file   Social History Narrative  . No narrative on file    FAMILY HISTORY: Family History  Problem Relation Age of Onset  . Pancreatic cancer Mother   . Aneurysm Mother   .  Lung cancer Father   . Lymphoma Sister   . Prostate cancer Neg Hx   . Kidney cancer Neg Hx   . Bladder Cancer Neg Hx     ALLERGIES:  has No Known Allergies.  MEDICATIONS:  Current Outpatient Prescriptions  Medication Sig Dispense Refill  . amLODipine (NORVASC) 10 MG tablet Take 10 mg by mouth every evening.     . etodolac (LODINE) 500 MG tablet Take by mouth daily as needed.     . hydrALAZINE (APRESOLINE) 25 MG tablet Take 25 mg by mouth every evening.     Marland Kitchen losartan (COZAAR) 100 MG tablet Take 100 mg by mouth every evening.     . pravastatin (PRAVACHOL) 80 MG tablet Take 80 mg by mouth every evening.     . zolpidem (AMBIEN) 10 MG tablet Take 10 mg by mouth at bedtime as needed.     Marland Kitchen allopurinol (ZYLOPRIM) 100 MG tablet Take 100 mg by mouth daily as needed.      No current facility-administered medications for this visit.       Marland Kitchen  PHYSICAL EXAMINATION: ECOG PERFORMANCE STATUS: 1 - Symptomatic but completely ambulatory Vitals:   05/18/17 1431  BP: (!) 144/68  Pulse: 60  Resp: 18  Temp: (!) 97.5 F (36.4 C)   Filed Weights   05/18/17 1431  Weight: 223 lb 8 oz (101.4 kg)     LABORATORY DATA:  I have reviewed the data as listed Lab Results  Component Value Date   WBC 7.2 05/18/2017   HGB 13.9 05/18/2017   HCT 40.4 05/18/2017   MCV 87.5 05/18/2017   PLT 211 05/18/2017    Recent Labs  04/05/17 1514 05/18/17 1522  NA  --  140  K  --  3.3*  CL  --  103  CO2  --  28  GLUCOSE  --  106*  BUN 16 16  CREATININE 1.22 0.99  CALCIUM  --  9.4  GFRNONAA 59* >60  GFRAA 69 >60  PROT  --  7.6  ALBUMIN  --  4.0  AST  --  15  ALT  --  11*  ALKPHOS  --  92  BILITOT  --  0.7    RADIOGRAPHIC STUDIES: I have personally reviewed the radiological images as listed and agreed with the findings in the report.   ASSESSMENT & PLAN:  1. Kidney mass    lenghty discussion with patient regarding her image results. Will obtain PET scan to evaluate diease extent and  facilitate biopsy location.  Baseline work up inlcuding cbc, cmp, LDH, flowcytometry, monoclonal gammopathy evaluation hepatitis panel, uric acid.  Possibility of bone marrow biopsy was also discussed with patient.  All questions were answered. The patient knows to call the clinic with any problems questions or concerns.  Return of visit: after PET scan.   Thank you for this kind referral and the opportunity to participate in the care of this patient. A copy of today's note is routed to referring provider    Earlie Server, MD, PhD Hematology Oncology [facility] at [facility] Pager- [phone] 05/18/2017

## 2017-05-18 NOTE — Telephone Encounter (Signed)
Orders faxed to IR, will call patient with date when faxed back

## 2017-05-18 NOTE — Telephone Encounter (Signed)
-----   Message from [place], RN sent at 05/18/2017  8:48 AM EDT ----- Regarding: biopsy   ----- Message ----- From: Benard Halsted Sent: 05/17/2017   8:31 AM To: Ranell Patrick, RN  He has already been referred the the cancer center by Dr. Ginette Pitman and he has an app on 05-18-17 for the mass. We don't need to send another one.  Sharyn Lull ----- Message ----- From: Ranell Patrick, RN Sent: 05/16/2017   4:34 PM To: Benard Halsted  I will schedule the rest if you can refer this pt to Dr Mike Gip at family's request. Contact is pt's wife, Jigar Zielke - [phone]  Thanks!!! Amy  ----- Message ----- From: Nickie Retort, MD Sent: 05/13/2017  12:51 PM To: Amy Linton Flemings, RN  The patient needs a referral to interventional radiology for biopsy of retroperitoneal mass. 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 He does not have ureteral cancer. He will also need a stent exchange in 3 months. Thanks

## 2017-05-19 LAB — PROTEIN ELECTROPHORESIS, SERUM
A/G Ratio: 1.2 (ref 0.7–1.7)
ALPHA-2-GLOBULIN: 0.8 g/dL (ref 0.4–1.0)
Albumin ELP: 3.6 g/dL (ref 2.9–4.4)
Alpha-1-Globulin: 0.2 g/dL (ref 0.0–0.4)
BETA GLOBULIN: 0.9 g/dL (ref 0.7–1.3)
GAMMA GLOBULIN: 1.2 g/dL (ref 0.4–1.8)
GLOBULIN, TOTAL: 3.1 g/dL (ref 2.2–3.9)
Total Protein ELP: 6.7 g/dL (ref 6.0–8.5)

## 2017-05-19 LAB — KAPPA/LAMBDA LIGHT CHAINS
KAPPA FREE LGHT CHN: 14.7 mg/L (ref 3.3–19.4)
Kappa, lambda light chain ratio: 1.07 (ref 0.26–1.65)
LAMDA FREE LIGHT CHAINS: 13.8 mg/L (ref 5.7–26.3)

## 2017-05-19 LAB — HEPATITIS PANEL, ACUTE
HCV AB: 0.3 {s_co_ratio} (ref 0.0–0.9)
HEP B C IGM: NEGATIVE
Hep A IgM: NEGATIVE
Hepatitis B Surface Ag: NEGATIVE

## 2017-05-23 LAB — COMP PANEL: LEUKEMIA/LYMPHOMA

## 2017-05-25 ENCOUNTER — Inpatient Hospital Stay: Payer: Medicare HMO | Admitting: Oncology

## 2017-05-26 ENCOUNTER — Encounter: Payer: Self-pay | Admitting: Oncology

## 2017-05-26 ENCOUNTER — Inpatient Hospital Stay (HOSPITAL_BASED_OUTPATIENT_CLINIC_OR_DEPARTMENT_OTHER): Payer: Medicare HMO | Admitting: Oncology

## 2017-05-26 ENCOUNTER — Ambulatory Visit
Admission: RE | Admit: 2017-05-26 | Discharge: 2017-05-26 | Disposition: A | Discharge status: Home / Self Care | Payer: Medicare HMO | Source: Ambulatory Visit | Attending: Oncology | Admitting: Oncology

## 2017-05-26 VITALS — BP 127/71 | HR 62 | Temp 97.2°F | Wt 219.2 lb

## 2017-05-26 DIAGNOSIS — R948 Abnormal results of function studies of other organs and systems: Secondary | ICD-10-CM | POA: Diagnosis not present

## 2017-05-26 DIAGNOSIS — C801 Malignant (primary) neoplasm, unspecified: Secondary | ICD-10-CM | POA: Diagnosis not present

## 2017-05-26 DIAGNOSIS — N1330 Unspecified hydronephrosis: Secondary | ICD-10-CM | POA: Diagnosis not present

## 2017-05-26 DIAGNOSIS — M109 Gout, unspecified: Secondary | ICD-10-CM | POA: Diagnosis not present

## 2017-05-26 DIAGNOSIS — C786 Secondary malignant neoplasm of retroperitoneum and peritoneum: Secondary | ICD-10-CM | POA: Diagnosis not present

## 2017-05-26 DIAGNOSIS — R1909 Other intra-abdominal and pelvic swelling, mass and lump: Secondary | ICD-10-CM | POA: Diagnosis not present

## 2017-05-26 DIAGNOSIS — R599 Enlarged lymph nodes, unspecified: Secondary | ICD-10-CM | POA: Diagnosis not present

## 2017-05-26 DIAGNOSIS — F17210 Nicotine dependence, cigarettes, uncomplicated: Secondary | ICD-10-CM | POA: Diagnosis not present

## 2017-05-26 DIAGNOSIS — N2889 Other specified disorders of kidney and ureter: Secondary | ICD-10-CM

## 2017-05-26 DIAGNOSIS — Z79899 Other long term (current) drug therapy: Secondary | ICD-10-CM | POA: Diagnosis not present

## 2017-05-26 DIAGNOSIS — C799 Secondary malignant neoplasm of unspecified site: Secondary | ICD-10-CM | POA: Diagnosis not present

## 2017-05-26 DIAGNOSIS — R3129 Other microscopic hematuria: Secondary | ICD-10-CM

## 2017-05-26 DIAGNOSIS — R634 Abnormal weight loss: Secondary | ICD-10-CM

## 2017-05-26 DIAGNOSIS — C7989 Secondary malignant neoplasm of other specified sites: Secondary | ICD-10-CM | POA: Insufficient documentation

## 2017-05-26 DIAGNOSIS — I10 Essential (primary) hypertension: Secondary | ICD-10-CM | POA: Diagnosis not present

## 2017-05-26 DIAGNOSIS — C7951 Secondary malignant neoplasm of bone: Secondary | ICD-10-CM | POA: Insufficient documentation

## 2017-05-26 LAB — GLUCOSE, CAPILLARY: Glucose-Capillary: 100 mg/dL — ABNORMAL HIGH (ref 65–99)

## 2017-05-26 MED ORDER — FLUDEOXYGLUCOSE F - 18 (FDG) INJECTION
12.0000 | Freq: Once | INTRAVENOUS | Status: AC | PRN
  Administered 2017-05-26: 12.19 via INTRAVENOUS

## 2017-05-26 NOTE — Progress Notes (Signed)
Hematology/Oncology follow-up note [facility] [phone] [phone]   Patient Care Team: Tracie Harrier, MD as PCP - General (Internal Medicine)  REFERRING PROVIDER: Tracie Harrier, MD CHIEF COMPLAINTS/PURPOSE OF CONSULTATION:  Kidney mass  HISTORY OF PRESENTING ILLNESS:  John Gallegos is a  71 y.o.  male with PMH listed below presented for follow-up on evaluation of kidney mass.  Patient was found to have microscopic hematuria on 02/23/2017 with 4-10 RBC's/hpf.    Urine culture was negative.having symptoms of nocturia He is smoker. 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 He was in the [employer] and reports to be exposed to [place] in [place].  CT scan 04/20/2017 which revealed moderate left hydronephrosis and delayed excretion down to a large 4.4 cm mass in or around the left proximal ureter. There was also hazy soft tissue in the fat surrounding the celiac axis and superior mesenteric artery. Thickening of the right diaphragmatic crus. High gastrohepatic ligament lymphadenopathy measures up to 11 mm. Peripancreatic and root of small bowel mesentery adenopathy measures up to 2.6 cm.  He was seen by urologist Dr. Pilar Jarvis and he underwent cystoscopy, left retrograde pyelogram, left ureteroscopy and placement of left ureteral stent placement on 05/13/2017. Finding during the procedure that he has extrinsic compression of the ureter, possibly from lymphoma.  Patient reports unintentional weight loss. He and his wife are very anxious. Denies any joint pain or back pain. PET scan was done today. Patient is here to discuss results and the next steps of management.    Review of Systems  Constitutional: Negative for fever, night sweats,unintentional weight loss, change in appetite.(+) unintentional weight loss, HENT: Negative for ear pain, hearing loss, nasal bleeding Eyes: Negative for eye pain, double vision   Respiratory:  Negative for wheezing, shortness of breath, cough Cardiovascular: Negative for chest pain, palpitation.   Gastrointestinal: Negative abdominal pain, diarrhea, nausea vomiting Endocrine: Negative  Genitourinary: Negative for dysuria, hematuria, frequency Skin: Negative for rash, iching, bruising Neurological: Negative for headache, dizziness, seizure Hematological: Negative for easy bruising/bleeding, lymph node enlargement Psychiatric/Behavioral: Negative for depression, anxiety, suicidality MEDICAL HISTORY:  Past Medical History:  Diagnosis Date  . Gout   . Hard of hearing   . Hypertension   . Urinary incontinence     SURGICAL HISTORY: Past Surgical History:  Procedure Laterality Date  . CYSTOSCOPY WITH BIOPSY Left 05/13/2017   Procedure: CYSTOSCOPY WITH URETERAL BIOPSY;  Surgeon: Nickie Retort, MD;  Location: [facility] OR[facility];  Service: Urology;  Laterality: Left;  . CYSTOSCOPY WITH STENT PLACEMENT Left 05/13/2017   Procedure: CYSTOSCOPY WITH STENT PLACEMENT;  Surgeon: Nickie Retort, MD;  Location: [facility] OR[facility];  Service: Urology;  Laterality: Left;  . pyleostenosis     29 weeks old  . right knne surgery     needle went through knee  . TONSILLECTOMY    . URETEROSCOPY Left 05/13/2017   Procedure: URETEROSCOPY;  Surgeon: Nickie Retort, MD;  Location: [facility] ORS;  Service: Urology;  Laterality: Left;    SOCIAL HISTORY: Social History   Social History  . Marital status: Married    Spouse name: N/A  . Number of children: N/A  . Years of education: N/A   Occupational History  . Not on file.   Social History Main Topics  . Smoking status: Current Every Day Smoker    Packs/day: 0.50    Years: 50.00    Types: Cigarettes  . Smokeless tobacco: Never Used  . Alcohol use 1.8  oz/week    3 Shots of liquor per week  . Drug use: No  . Sexual activity: Not on file   Other Topics Concern  . Not on file   Social History Narrative  . No narrative on file    FAMILY  HISTORY: Family History  Problem Relation Age of Onset  . Pancreatic cancer Mother   . Aneurysm Mother   . Lung cancer Father   . Lymphoma Sister   . Prostate cancer Neg Hx   . Kidney cancer Neg Hx   . Bladder Cancer Neg Hx     ALLERGIES:  has No Known Allergies.  MEDICATIONS:  Current Outpatient Prescriptions  Medication Sig Dispense Refill  . allopurinol (ZYLOPRIM) 100 MG tablet Take 100 mg by mouth daily as needed.     Marland Kitchen amLODipine (NORVASC) 10 MG tablet Take 10 mg by mouth every evening.     . etodolac (LODINE) 500 MG tablet Take by mouth daily as needed.     . hydrALAZINE (APRESOLINE) 25 MG tablet Take 25 mg by mouth every evening.     Marland Kitchen losartan (COZAAR) 100 MG tablet Take 100 mg by mouth every evening.     . pravastatin (PRAVACHOL) 80 MG tablet Take 80 mg by mouth every evening.     . zolpidem (AMBIEN) 10 MG tablet Take 10 mg by mouth at bedtime as needed.      No current facility-administered medications for this visit.       Marland Kitchen  PHYSICAL EXAMINATION: ECOG PERFORMANCE STATUS: 1 - Symptomatic but completely ambulatory Vitals:   05/26/17 1556  BP: 127/71  Pulse: 62  Temp: (!) 97.2 F (36.2 C)   Filed Weights   05/26/17 1556  Weight: 219 lb 3 oz (99.4 kg)     LABORATORY DATA:  I have reviewed the data as listed Lab Results  Component Value Date   WBC 7.2 05/18/2017   HGB 13.9 05/18/2017   HCT 40.4 05/18/2017   MCV 87.5 05/18/2017   PLT 211 05/18/2017    Recent Labs  04/05/17 1514 05/18/17 1522  NA  --  140  K  --  3.3*  CL  --  103  CO2  --  28  GLUCOSE  --  106*  BUN 16 16  CREATININE 1.22 0.99  CALCIUM  --  9.4  GFRNONAA 59* >60  GFRAA 69 >60  PROT  --  7.6  ALBUMIN  --  4.0  AST  --  15  ALT  --  11*  ALKPHOS  --  92  BILITOT  --  0.7    RADIOGRAPHIC STUDIES:O I have personally reviewed the radiological images as listed and agreed with the findings in the report. 05/26/2017 PET scan IMPRESSION: 1. 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 Recommend percutaneous biopsy of the RIGHT paraspinal musculature at L2. 2. 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  ASSESSMENT & PLAN:  1. Weight loss   2. Abnormal positron emission tomography (PET) scan    I had a lengthy discussion with patient regarding her PET scan results. Baseline work up inlcuding cbc, cmp, LDH, flowcytometry, monoclonal gammopathy evaluation hepatitis panel, uric acid. Not conclusive. PET scan results concerning for lymphoma. I discussed with radiologist regarding patient PET scan. Given that the paraspinal muscle panel no longer exist consistent with infiltrative process, we will pursue biopsy of the paraspinal muscle. Hepatitis  negative. Will also obtain 2-D echo. All questions were answered. Patient and his wife requested me to call her daughter-in-law Leana Roe and discuss patient's results with her. I did call Tracie and discussed with her about the image results and a biopsy plan.  The patient knows to call the clinic with any problems questions or concerns. Patient's case will be presented on tumor board as well. Return of visit: After biopsy results come back.  Thank you for this kind referral and the opportunity to participate in the care of this patient. A copy of today's note is routed to referring provider    Earlie Server, MD, PhD Hematology Oncology [facility] at [facility] Pager- [phone] 05/26/2017

## 2017-05-26 NOTE — Progress Notes (Signed)
Patient here today for follow up.   

## 2017-05-30 NOTE — Discharge Instructions (Signed)
Needle Biopsy, Care After °Refer to this sheet in the next few weeks. These instructions provide you with information about caring for yourself after your procedure. Your health care provider may also give you more specific instructions. Your treatment has been planned according to current medical practices, but problems sometimes occur. Call your health care provider if you have any problems or questions after your procedure. °What can I expect after the procedure? °After your procedure, it is common to have soreness, bruising, or mild pain at the biopsy site. This should go away in a few days. °Follow these instructions at home: °· Rest as directed by your health care provider. °· Take medicines only as directed by your health care provider. °· There are many different ways to close and cover the biopsy site, including stitches (sutures), skin glue, and adhesive strips. Follow your health care provider's instructions about: °? Biopsy site care. °? Bandage (dressing) changes and removal. °? Biopsy site closure removal. °· Check your biopsy site every day for signs of infection. Watch for: °? Redness, swelling, or pain. °? Fluid, blood, or pus. °Contact a health care provider if: °· You have a fever. °· You have redness, swelling, or pain at the biopsy site that lasts longer than a few days. °· You have fluid, blood, or pus coming from the biopsy site. °· You feel nauseous. °· You vomit. °Get help right away if: °· You have shortness of breath. °· You have trouble breathing. °· You have chest pain. °· You feel dizzy or you faint. °· You have bleeding that does not stop with pressure or a bandage. °· You cough up blood. °· You have pain in your abdomen. °This information is not intended to replace advice given to you by your health care provider. Make sure you discuss any questions you have with your health care provider. °Document Released: 12/10/2014 Document Revised: 01/01/2016 Document Reviewed:  07/22/2014 °Elsevier Interactive Patient Education © 2018 [employer] ° °

## 2017-05-31 ENCOUNTER — Other Ambulatory Visit: Payer: Self-pay | Admitting: Radiology

## 2017-06-01 ENCOUNTER — Ambulatory Visit
Admission: RE | Admit: 2017-06-01 | Discharge: 2017-06-01 | Disposition: A | Discharge status: Home / Self Care | Payer: Medicare HMO | Source: Ambulatory Visit | Attending: Oncology | Admitting: Oncology

## 2017-06-01 DIAGNOSIS — R634 Abnormal weight loss: Secondary | ICD-10-CM | POA: Diagnosis not present

## 2017-06-01 DIAGNOSIS — R222 Localized swelling, mass and lump, trunk: Secondary | ICD-10-CM | POA: Diagnosis not present

## 2017-06-01 DIAGNOSIS — C8519 Unspecified B-cell lymphoma, extranodal and solid organ sites: Secondary | ICD-10-CM | POA: Diagnosis not present

## 2017-06-01 DIAGNOSIS — C8333 Diffuse large B-cell lymphoma, intra-abdominal lymph nodes: Secondary | ICD-10-CM | POA: Insufficient documentation

## 2017-06-01 DIAGNOSIS — C7951 Secondary malignant neoplasm of bone: Secondary | ICD-10-CM | POA: Diagnosis not present

## 2017-06-01 DIAGNOSIS — C8339 Diffuse large B-cell lymphoma, extranodal and solid organ sites: Secondary | ICD-10-CM | POA: Diagnosis not present

## 2017-06-01 DIAGNOSIS — N2889 Other specified disorders of kidney and ureter: Secondary | ICD-10-CM | POA: Diagnosis not present

## 2017-06-01 LAB — CBC
HEMATOCRIT: 39.4 % — AB (ref 40.0–52.0)
HEMOGLOBIN: 13.5 g/dL (ref 13.0–18.0)
MCH: 29.8 pg (ref 26.0–34.0)
MCHC: 34.3 g/dL (ref 32.0–36.0)
MCV: 87 fL (ref 80.0–100.0)
Platelets: 171 10*3/uL (ref 150–440)
RBC: 4.52 MIL/uL (ref 4.40–5.90)
RDW: 14.3 % (ref 11.5–14.5)
WBC: 4.6 10*3/uL (ref 3.8–10.6)

## 2017-06-01 LAB — PROTIME-INR
INR: 0.96
Prothrombin Time: 12.7 seconds (ref 11.4–15.2)

## 2017-06-01 LAB — APTT: APTT: 29 s (ref 24–36)

## 2017-06-01 MED ORDER — FENTANYL CITRATE (PF) 100 MCG/2ML IJ SOLN
INTRAMUSCULAR | Status: AC
  Filled 2017-06-01: qty 2

## 2017-06-01 MED ORDER — MIDAZOLAM HCL 2 MG/2ML IJ SOLN
INTRAMUSCULAR | Status: AC
  Filled 2017-06-01: qty 4

## 2017-06-01 MED ORDER — SODIUM CHLORIDE 0.9 % IV SOLN
INTRAVENOUS | Status: DC
  Administered 2017-06-01: 09:00:00 via INTRAVENOUS

## 2017-06-01 MED ORDER — HYDROCODONE-ACETAMINOPHEN 5-325 MG PO TABS: 1.0000 | ORAL_TABLET | ORAL | Status: DC | PRN

## 2017-06-01 MED ORDER — FENTANYL CITRATE (PF) 100 MCG/2ML IJ SOLN
INTRAMUSCULAR | Status: AC | PRN
  Administered 2017-06-01: 50 ug via INTRAVENOUS

## 2017-06-01 MED ORDER — MIDAZOLAM HCL 2 MG/2ML IJ SOLN
INTRAMUSCULAR | Status: AC | PRN
  Administered 2017-06-01: 1 mg via INTRAVENOUS

## 2017-06-01 NOTE — Procedures (Signed)
  Procedure: CT guided R paraspinal mass core bx 18g x4 in saline to surg path Preprocedure diagnosis: Metastatic disease Postprocedure diagnosis: same EBL:   minimal Complications:  none immediate  See full dictation in [facility].  Dillard Cannon MD Main # [phone] Pager  [phone]

## 2017-06-03 ENCOUNTER — Other Ambulatory Visit: Payer: Self-pay | Admitting: *Deleted

## 2017-06-03 DIAGNOSIS — C8510 Unspecified B-cell lymphoma, unspecified site: Secondary | ICD-10-CM

## 2017-06-06 ENCOUNTER — Telehealth: Payer: Self-pay | Admitting: *Deleted

## 2017-06-06 NOTE — Telephone Encounter (Addendum)
Called patient to inform him that his bone marrow biopsy is scheduled for Monday 06-13-17.  Patient to arrive @ 7:30 am for procedure @ 8:30 am. A nurse will be calling a few days before to go over any special instructions. Patient repeated back to me.

## 2017-06-07 ENCOUNTER — Encounter: Payer: Self-pay | Admitting: Interventional Radiology

## 2017-06-07 ENCOUNTER — Ambulatory Visit
Admission: RE | Admit: 2017-06-07 | Discharge: 2017-06-07 | Disposition: A | Discharge status: Home / Self Care | Payer: Medicare HMO | Source: Ambulatory Visit | Attending: Oncology | Admitting: Oncology

## 2017-06-07 DIAGNOSIS — I5030 Unspecified diastolic (congestive) heart failure: Secondary | ICD-10-CM | POA: Diagnosis not present

## 2017-06-07 DIAGNOSIS — I420 Dilated cardiomyopathy: Secondary | ICD-10-CM | POA: Diagnosis not present

## 2017-06-07 DIAGNOSIS — R634 Abnormal weight loss: Secondary | ICD-10-CM | POA: Diagnosis not present

## 2017-06-07 NOTE — Progress Notes (Signed)
*  PRELIMINARY RESULTS* Echocardiogram 2D Echocardiogram has been performed.  John Gallegos 06/07/2017, 11:33 AM

## 2017-06-09 ENCOUNTER — Encounter: Payer: Self-pay | Admitting: Oncology

## 2017-06-09 ENCOUNTER — Inpatient Hospital Stay: Payer: Medicare HMO | Attending: Oncology | Admitting: Oncology

## 2017-06-09 ENCOUNTER — Inpatient Hospital Stay: Payer: Medicare HMO

## 2017-06-09 VITALS — BP 150/77 | HR 53 | Temp 96.6°F | Resp 18 | Wt 220.2 lb

## 2017-06-09 DIAGNOSIS — C8518 Unspecified B-cell lymphoma, lymph nodes of multiple sites: Secondary | ICD-10-CM

## 2017-06-09 DIAGNOSIS — C7951 Secondary malignant neoplasm of bone: Secondary | ICD-10-CM | POA: Diagnosis not present

## 2017-06-09 DIAGNOSIS — M109 Gout, unspecified: Secondary | ICD-10-CM | POA: Insufficient documentation

## 2017-06-09 DIAGNOSIS — I10 Essential (primary) hypertension: Secondary | ICD-10-CM | POA: Diagnosis not present

## 2017-06-09 DIAGNOSIS — Z7982 Long term (current) use of aspirin: Secondary | ICD-10-CM | POA: Insufficient documentation

## 2017-06-09 DIAGNOSIS — M7989 Other specified soft tissue disorders: Secondary | ICD-10-CM | POA: Insufficient documentation

## 2017-06-09 DIAGNOSIS — Z79899 Other long term (current) drug therapy: Secondary | ICD-10-CM | POA: Diagnosis not present

## 2017-06-09 DIAGNOSIS — Z5111 Encounter for antineoplastic chemotherapy: Secondary | ICD-10-CM | POA: Insufficient documentation

## 2017-06-09 DIAGNOSIS — Z7689 Persons encountering health services in other specified circumstances: Secondary | ICD-10-CM | POA: Diagnosis not present

## 2017-06-09 DIAGNOSIS — C8330 Diffuse large B-cell lymphoma, unspecified site: Secondary | ICD-10-CM | POA: Diagnosis not present

## 2017-06-09 DIAGNOSIS — D696 Thrombocytopenia, unspecified: Secondary | ICD-10-CM | POA: Diagnosis not present

## 2017-06-09 DIAGNOSIS — F17210 Nicotine dependence, cigarettes, uncomplicated: Secondary | ICD-10-CM | POA: Diagnosis not present

## 2017-06-09 LAB — BASIC METABOLIC PANEL
Anion gap: 4 — ABNORMAL LOW (ref 5–15)
BUN: 17 mg/dL (ref 6–20)
CALCIUM: 9.2 mg/dL (ref 8.9–10.3)
CO2: 30 mmol/L (ref 22–32)
CREATININE: 0.99 mg/dL (ref 0.61–1.24)
Chloride: 104 mmol/L (ref 101–111)
GFR calc Af Amer: >60 mL/min (ref >60)
Glucose, Bld: 115 mg/dL — ABNORMAL HIGH (ref 65–99)
POTASSIUM: 3.4 mmol/L — AB (ref 3.5–5.1)
SODIUM: 138 mmol/L (ref 135–145)

## 2017-06-09 NOTE — Progress Notes (Signed)
Patient here today for follow up.  Patient c/o of frequent urination, every time he has to stand he has to urinate, right groin pain, and edema of the right hand at the beginning of this week.

## 2017-06-09 NOTE — Progress Notes (Signed)
Hematology/Oncology follow-up note [facility] Telephone:([phone] [phone]   Patient Care Team: Tracie Harrier, MD as PCP - General (Internal Medicine)  REFERRING PROVIDER: Tracie Harrier, MD  REASON FOR VISIT Follow up for treatment of B cell lymphoma.   HISTORY OF PRESENTING ILLNESS:  John Gallegos is a  71 y.o.  male with PMH listed below presented for follow-up on evaluation of newly diagnosed B cell lymphoma.  The patient is accompanied by his wife and his son to the clinic today to discuss about the biopsy result. Patient underwent a CT-guided biopsy of paraspinal muscle mass.  Pertinent history of oncology workup includes: Patient was found to have microscopic hematuria on 02/23/2017 with 4-10 RBC's/hpf.    Urine culture was negative.having symptoms of nocturia He is smoker. 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 He was in the [employer] and reports to be exposed to [place] in [place]. CT scan 04/20/2017 which revealed moderate left hydronephrosis and delayed excretion down to a large 4.4 cm mass in or around the left proximal ureter. There was also hazy soft tissue in the fat surrounding the celiac axis and superior mesenteric artery. Thickening of the right diaphragmatic crus. High gastrohepatic ligament lymphadenopathy measures up to 11 mm. Peripancreatic and root of small bowel mesentery adenopathy measures up to 2.6 cm.  He was seen by urologist Dr. Pilar Jarvis and he underwent cystoscopy, left retrograde pyelogram, left ureteroscopy and placement of left ureteral stent placement on 05/13/2017. Finding during the procedure that he has extrinsic compression of the ureter, possibly from lymphoma.   PET scan 05/18/2017  IMPRESSION: 1. Multifocal metastatic disease within the LEFT retroperitoneum, central mesenteries, and RIGHT paraspinal musculature. 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 Recommend percutaneous biopsy of the  RIGHT paraspinal musculature at L2. 2. 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  Patient underwent biopsy of paraspinal musculature at L2. Pathology resultsreviewed large B cell lymphoma. It is CD20 positive, CD10 positive. Flow cytometry shows a small clonal population of large B cells positive for CD10 and absent cop and lambda light chains. The abnormal cells represent 2% of the total cells. The morphology, initial IHC panel, and flow cytometry supports classification as B-cell lymphoma, CD10 positive, with large cell features. Finish for [employer], BCL-2, BCL 6 gene rearrangement R pending. Favoring diffuse large B-cell lymphoma, germinal center B-cell type.   Today patient's reports feeling anxious. Otherwise he has a good appetite, denies any night sweats or fever or chills. He is fairly asymptomatic.  Review of Systems  Constitutional: Negative for fever, night sweats,change in appetite. unintentional weight loss,  HENT: Negative for ear pain, hearing loss, nasal bleeding Eyes: Negative for eye pain, double vision   Respiratory: Negative for wheezing, shortness of breath, cough Cardiovascular: Negative for chest pain, palpitation.   Gastrointestinal: Negative abdominal pain, diarrhea, nausea vomiting Endocrine: Negative  Genitourinary: Negative for dysuria, hematuria, frequency. Positive for urinary urgency when change position from sitting to standing.  Skin: Negative for rash, iching, bruising Neurological: Negative for headache, dizziness, seizure Hematological: Negative for easy bruising/bleeding, lymph node enlargement Psychiatric/Behavioral: Negative for depression, anxiety, suicidality  MEDICAL HISTORY:  Past Medical History:  Diagnosis Date  . Gout   . Hard of hearing   . Hypertension   . Urinary incontinence     SURGICAL HISTORY: Past Surgical History:  Procedure Laterality Date  . CYSTOSCOPY WITH BIOPSY Left 05/13/2017   Procedure:  CYSTOSCOPY WITH URETERAL BIOPSY;  Surgeon: Pilar Jarvis,  Horald Pollen, MD;  Location: [facility] ORS;  Service: Urology;  Laterality: Left;  . CYSTOSCOPY WITH STENT PLACEMENT Left 05/13/2017   Procedure: CYSTOSCOPY WITH STENT PLACEMENT;  Surgeon: Nickie Retort, MD;  Location: [facility] ORS;  Service: Urology;  Laterality: Left;  . 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 right knne surgery     needle went through knee  . TONSILLECTOMY    . URETEROSCOPY Left 05/13/2017   Procedure: URETEROSCOPY;  Surgeon: Nickie Retort, MD;  Location: [facility] ORS;  Service: Urology;  Laterality: Left;    SOCIAL HISTORY: Social History   Social History  . Marital status: Married    Spouse name: N/A  . Number of children: N/A  . Years of education: N/A   Occupational History  . Not on file.   Social History Main Topics  . Smoking status: Current Every Day Smoker    Packs/day: 0.50    Years: 50.00    Types: Cigarettes  . Smokeless tobacco: Never Used  . Alcohol use 1.8 oz/week    3 Shots of liquor per week  . Drug use: No  . Sexual activity: Not on file   Other Topics Concern  . Not on file   Social History Narrative  . No narrative on file    FAMILY HISTORY: Family History  Problem Relation Age of Onset  . Pancreatic cancer Mother   . Aneurysm Mother   . Lung cancer Father   . Lymphoma Sister   . Prostate cancer Neg Hx   . Kidney cancer Neg Hx   . Bladder Cancer Neg Hx     ALLERGIES:  has No Known Allergies.  MEDICATIONS:  Current Outpatient Prescriptions  Medication Sig Dispense Refill  . allopurinol (ZYLOPRIM) 100 MG tablet Take 100 mg by mouth daily as needed.     Marland Kitchen amLODipine (NORVASC) 10 MG tablet Take 10 mg by mouth every evening.     . etodolac (LODINE) 500 MG tablet Take by mouth daily as needed.     . hydrALAZINE (APRESOLINE) 25 MG tablet Take 25 mg by mouth every evening.     Marland Kitchen losartan (COZAAR) 100 MG tablet Take 100 mg by mouth every evening.     . pravastatin (PRAVACHOL) 80 MG  tablet Take 80 mg by mouth every evening.     . zolpidem (AMBIEN) 10 MG tablet Take 10 mg by mouth at bedtime as needed.      No current facility-administered medications for this visit.       Marland Kitchen  PHYSICAL EXAMINATION: ECOG PERFORMANCE STATUS: 1 - Symptomatic but completely ambulatory Vitals:   06/09/17 1002  BP: (!) 150/77  Pulse: (!) 53  Resp: 18  Temp: (!) 96.6 F (35.9 C)   Filed Weights   06/09/17 1002  Weight: 220 lb 3.8 oz (99.9 kg)     LABORATORY DATA:  I have reviewed the data as listed Lab Results  Component Value Date   WBC 4.6 06/01/2017   HGB 13.5 06/01/2017   HCT 39.4 (L) 06/01/2017   MCV 87.0 06/01/2017   PLT 171 06/01/2017    Recent Labs  04/05/17 1514 05/18/17 1522  NA  --  140  K  --  3.3*  CL  --  103  CO2  --  28  GLUCOSE  --  106*  BUN 16 16  CREATININE 1.22 0.99  CALCIUM  --  9.4  GFRNONAA 59* >60  GFRAA 69 >60  PROT  --  7.6  ALBUMIN  --  4.0  AST  --  15  ALT  --  11*  ALKPHOS  --  92  BILITOT  --  0.7    RADIOGRAPHIC STUDIES:O I have personally reviewed the radiological images as listed and agreed with the findings in the report. 05/26/2017 PET scan IMPRESSION: 1. Multifocal metastatic disease within the LEFT retroperitoneum,central mesenteries, and RIGHT paraspinal musculature. 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 Recommend percutaneous biopsy of theRIGHT paraspinal musculature at L2. 2. 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  ASSESSMENT & PLAN:  1. B-cell lymphoma of lymph nodes of multiple regions, unspecified B-cell lymphoma type ([place])    I had a lengthy discussion with patient, wife and son regarding her biopsy results, likely diffuse large B-cell lymphoma, although due to the biopsy was from soft tissue rather than a lymph node,  follicular cell lymphoma is in the differential. His LDH is normal, PET scan showed FDG activity mostly less than 10. I talked to [facility] and reviewed  patient's images. There is no easily assessable lymph nodes for additional biopsy. The only other possible biopsy site is the soft tissue around the ureter. The mesenteric adenopathy cannot be assessed with interventional radiology and has to be through surgical attempt. Also discussed patient's case with pathology Dr.Olney. Still waiting for cymc, BCL-2, BCL 6 rearrangement and Ki-67 staining. She confirms that patient has diffuse large B cell lymphoma and Follicular lymphoma is not in the differential.  We refer him to vascular surgeon for Mediport placement and a chemotherapy class. Plan R CHOP x 6. Hepatitis B antigen negative. 2-D echo.showed LVEF 60%   Patient and his wife requested me to call patient's son Akhil Piscopo at [phone] for future communication. I called him and communicated with him. He will relay information to patient.  The patient knows to call the clinic with any problems questions or concerns.  Return of visit: lab/MD visit after port placement. Day 1 of R CHOP. Thank you for this kind referral and the opportunity to participate in the care of this patient. A copy of today's note is routed to referring provider    Earlie Server, MD, PhD Hematology Oncology [facility] Pager- [phone] 06/09/2017

## 2017-06-10 ENCOUNTER — Encounter (INDEPENDENT_AMBULATORY_CARE_PROVIDER_SITE_OTHER): Payer: Self-pay

## 2017-06-10 ENCOUNTER — Other Ambulatory Visit (INDEPENDENT_AMBULATORY_CARE_PROVIDER_SITE_OTHER): Payer: Self-pay | Admitting: Vascular Surgery

## 2017-06-10 ENCOUNTER — Other Ambulatory Visit: Payer: Self-pay | Admitting: Radiology

## 2017-06-10 NOTE — Patient Instructions (Signed)
Rituximab injection What is this medicine? RITUXIMAB (ri TUX i mab) is a monoclonal antibody. It is used to treat certain types of cancer like non-Hodgkin lymphoma and chronic lymphocytic leukemia. It is also used to treat rheumatoid arthritis, granulomatosis with polyangiitis (or Wegener's granulomatosis), and microscopic polyangiitis. This medicine may be used for other purposes; ask your health care provider or pharmacist if you have questions. COMMON BRAND NAME(S): Rituxan What should I tell my health care provider before I take this medicine? They need to know if you have any of these conditions: -heart disease -infection (especially a virus infection such as hepatitis B, chickenpox, cold sores, or herpes) -immune system problems -irregular heartbeat -kidney disease -lung or breathing disease, like asthma -recently received or scheduled to receive a vaccine -an unusual or allergic reaction to rituximab, mouse proteins, other medicines, foods, dyes, or preservatives -pregnant or trying to get pregnant -breast-feeding How should I use this medicine? This medicine is for infusion into a vein. It is administered in a hospital or clinic by a specially trained health care professional. A special MedGuide will be given to you by the pharmacist with each prescription and refill. Be sure to read this information carefully each time. Talk to your pediatrician regarding the use of this medicine in children. This medicine is not approved for use in children. Overdosage: If you think you have taken too much of this medicine contact a poison control center or emergency room at once. NOTE: This medicine is only for you. Do not share this medicine with others. What if I miss a dose? It is important not to miss a dose. Call your doctor or health care professional if you are unable to keep an appointment. What may interact with this medicine? -cisplatin -other medicines for arthritis like disease  modifying antirheumatic drugs or tumor necrosis factor inhibitors -live virus vaccines This list may not describe all possible interactions. Give your health care provider a list of all the medicines, herbs, non-prescription drugs, or dietary supplements you use. Also tell them if you smoke, drink alcohol, or use illegal drugs. Some items may interact with your medicine. What should I watch for while using this medicine? Your condition will be monitored carefully while you are receiving this medicine. You may need blood work done while you are taking this medicine. This medicine can cause serious allergic reactions. To reduce your risk you may need to take medicine before treatment with this medicine. Take your medicine as directed. In some patients, this medicine may cause a serious brain infection that may cause death. If you have any problems seeing, thinking, speaking, walking, or standing, tell your doctor right away. If you cannot reach your doctor, urgently seek other source of medical care. Call your doctor or health care professional for advice if you get a fever, chills or sore throat, or other symptoms of a cold or flu. Do not treat yourself. This drug decreases your body's ability to fight infections. Try to avoid being around people who are sick. Do not become pregnant while taking this medicine or for 12 months after stopping it. Women should inform their doctor if they wish to become pregnant or think they might be pregnant. There is a potential for serious side effects to an unborn child. Talk to your health care professional or pharmacist for more information. What side effects may I notice from receiving this medicine? Side effects that you should report to your doctor or health care professional as soon as possible: -breathing   problems -chest pain -dizziness or feeling faint -fast, irregular heartbeat -low blood counts - this medicine may decrease the number of white blood cells,  red blood cells and platelets. You may be at increased risk for infections and bleeding. -mouth sores -redness, blistering, peeling or loosening of the skin, including inside the mouth (this can be added for any serious or exfoliative rash that could lead to hospitalization) -signs of infection - fever or chills, cough, sore throat, pain or difficulty passing urine -signs and symptoms of kidney injury like trouble passing urine or change in the amount of urine -signs and symptoms of liver injury like dark yellow or brown urine; general ill feeling or flu-like symptoms; light-colored stools; loss of appetite; nausea; right upper belly pain; unusually weak or tired; yellowing of the eyes or skin -stomach pain -vomiting Side effects that usually do not require medical attention (report to your doctor or health care professional if they continue or are bothersome): -headache -joint pain -muscle cramps or muscle pain This list may not describe all possible side effects. Call your doctor for medical advice about side effects. You may report side effects to FDA at 1-800-FDA-1088. Where should I keep my medicine? This drug is given in a hospital or clinic and will not be stored at home. NOTE: This sheet is a summary. It may not cover all possible information. If you have questions about this medicine, talk to your doctor, pharmacist, or health care provider.  2018 Elsevier/Gold Standard (2016-03-03 15:28:09) Doxorubicin injection What is this medicine? DOXORUBICIN (dox oh ROO bi sin) is a chemotherapy drug. It is used to treat many kinds of cancer like leukemia, lymphoma, neuroblastoma, sarcoma, and Wilms' tumor. It is also used to treat bladder cancer, breast cancer, lung cancer, ovarian cancer, stomach cancer, and thyroid cancer. This medicine may be used for other purposes; ask your health care provider or pharmacist if you have questions. COMMON BRAND NAME(S): Adriamycin, Adriamycin PFS, Adriamycin  RDF, Rubex What should I tell my health care provider before I take this medicine? They need to know if you have any of these conditions: -heart disease -history of low blood counts caused by a medicine -liver disease -recent or ongoing radiation therapy -an unusual or allergic reaction to doxorubicin, other chemotherapy agents, other medicines, foods, dyes, or preservatives -pregnant or trying to get pregnant -breast-feeding How should I use this medicine? This drug is given as an infusion into a vein. It is administered in a hospital or clinic by a specially trained health care professional. If you have pain, swelling, burning or any unusual feeling around the site of your injection, tell your health care professional right away. Talk to your pediatrician regarding the use of this medicine in children. Special care may be needed. Overdosage: If you think you have taken too much of this medicine contact a poison control center or emergency room at once. NOTE: This medicine is only for you. Do not share this medicine with others. What if I miss a dose? It is important not to miss your dose. Call your doctor or health care professional if you are unable to keep an appointment. What may interact with this medicine? This medicine may interact with the following medications: -6-mercaptopurine -paclitaxel -phenytoin -St. John's Wort -trastuzumab -verapamil This list may not describe all possible interactions. Give your health care provider a list of all the medicines, herbs, non-prescription drugs, or dietary supplements you use. Also tell them if you smoke, drink alcohol, or use illegal drugs.   Some items may interact with your medicine. What should I watch for while using this medicine? This drug may make you feel generally unwell. This is not uncommon, as chemotherapy can affect healthy cells as well as cancer cells. Report any side effects. Continue your course of treatment even though you  feel ill unless your doctor tells you to stop. There is a maximum amount of this medicine you should receive throughout your life. The amount depends on the medical condition being treated and your overall health. Your doctor will watch how much of this medicine you receive in your lifetime. Tell your doctor if you have taken this medicine before. You may need blood work done while you are taking this medicine. Your urine may turn red for a few days after your dose. This is not blood. If your urine is dark or brown, call your doctor. In some cases, you may be given additional medicines to help with side effects. Follow all directions for their use. Call your doctor or health care professional for advice if you get a fever, chills or sore throat, or other symptoms of a cold or flu. Do not treat yourself. This drug decreases your body's ability to fight infections. Try to avoid being around people who are sick. This medicine may increase your risk to bruise or bleed. Call your doctor or health care professional if you notice any unusual bleeding. Talk to your doctor about your risk of cancer. You may be more at risk for certain types of cancers if you take this medicine. Do not become pregnant while taking this medicine or for 6 months after stopping it. Women should inform their doctor if they wish to become pregnant or think they might be pregnant. Men should not father a child while taking this medicine and for 6 months after stopping it. There is a potential for serious side effects to an unborn child. Talk to your health care professional or pharmacist for more information. Do not breast-feed an infant while taking this medicine. This medicine has caused ovarian failure in some women and reduced sperm counts in some men This medicine may interfere with the ability to have a child. Talk with your doctor or health care professional if you are concerned about your fertility. What side effects may I notice  from receiving this medicine? Side effects that you should report to your doctor or health care professional as soon as possible: -allergic reactions like skin rash, itching or hives, swelling of the face, lips, or tongue -breathing problems -chest pain -fast or irregular heartbeat -low blood counts - this medicine may decrease the number of white blood cells, red blood cells and platelets. You may be at increased risk for infections and bleeding. -pain, redness, or irritation at site where injected -signs of infection - fever or chills, cough, sore throat, pain or difficulty passing urine -signs of decreased platelets or bleeding - bruising, pinpoint red spots on the skin, black, tarry stools, blood in the urine -swelling of the ankles, feet, hands -tiredness -weakness Side effects that usually do not require medical attention (report to your doctor or health care professional if they continue or are bothersome): -diarrhea -hair loss -mouth sores -nail discoloration or damage -nausea -red colored urine -vomiting This list may not describe all possible side effects. Call your doctor for medical advice about side effects. You may report side effects to FDA at 1-800-FDA-1088. Where should I keep my medicine? This drug is given in a hospital or clinic   and will not be stored at home. NOTE: This sheet is a summary. It may not cover all possible information. If you have questions about this medicine, talk to your doctor, pharmacist, or health care provider.  2018 Elsevier/Gold Standard (2015-09-22 11:28:51) Vincristine injection What is this medicine? VINCRISTINE (vin KRIS teen) is a chemotherapy drug. It slows the growth of cancer cells. This medicine is used to treat many types of cancer like Hodgkin's disease, leukemia, non-Hodgkin's lymphoma, neuroblastoma (brain cancer), rhabdomyosarcoma, and Wilms' tumor. This medicine may be used for other purposes; ask your health care provider or  pharmacist if you have questions. COMMON BRAND NAME(S): Oncovin, Vincasar PFS What should I tell my health care provider before I take this medicine? They need to know if you have any of these conditions: -blood disorders -gout -infection (especially chickenpox, cold sores, or herpes) -kidney disease -liver disease -lung disease -nervous system disease like Charcot-Marie-Tooth (CMT) -recent or ongoing radiation therapy -an unusual or allergic reaction to vincristine, other chemotherapy agents, other medicines, foods, dyes, or preservatives -pregnant or trying to get pregnant -breast-feeding How should I use this medicine? This drug is given as an infusion into a vein. It is administered in a hospital or clinic by a specially trained health care professional. If you have pain, swelling, burning, or any unusual feeling around the site of your injection, tell your health care professional right away. Talk to your pediatrician regarding the use of this medicine in children. While this drug may be prescribed for selected conditions, precautions do apply. Overdosage: If you think you have taken too much of this medicine contact a poison control center or emergency room at once. NOTE: This medicine is only for you. Do not share this medicine with others. What if I miss a dose? It is important not to miss your dose. Call your doctor or health care professional if you are unable to keep an appointment. What may interact with this medicine? Do not take this medicine with any of the following medications: -itraconazole -mibefradil -voriconazole This medicine may also interact with the following medications: -cyclosporine -erythromycin -fluconazole -ketoconazole -medicines for HIV like delavirdine, efavirenz, nevirapine -medicines for seizures like ethotoin, fosphenotoin, phenytoin -medicines to increase blood counts like filgrastim, pegfilgrastim, sargramostim -other chemotherapy drugs like  cisplatin, L-asparaginase, methotrexate, mitomycin, paclitaxel -pegaspargase -vaccines -zalcitabine, ddC Talk to your doctor or health care professional before taking any of these medicines: -acetaminophen -aspirin -ibuprofen -ketoprofen -naproxen This list may not describe all possible interactions. Give your health care provider a list of all the medicines, herbs, non-prescription drugs, or dietary supplements you use. Also tell them if you smoke, drink alcohol, or use illegal drugs. Some items may interact with your medicine. What should I watch for while using this medicine? Your condition will be monitored carefully while you are receiving this medicine. You will need important blood work done while you are taking this medicine. This drug may make you feel generally unwell. This is not uncommon, as chemotherapy can affect healthy cells as well as cancer cells. Report any side effects. Continue your course of treatment even though you feel ill unless your doctor tells you to stop. In some cases, you may be given additional medicines to help with side effects. Follow all directions for their use. Call your doctor or health care professional for advice if you get a fever, chills or sore throat, or other symptoms of a cold or flu. Do not treat yourself. Avoid taking products that contain aspirin, acetaminophen, ibuprofen,   naproxen, or ketoprofen unless instructed by your doctor. These medicines may hide a fever. Do not become pregnant while taking this medicine. Women should inform their doctor if they wish to become pregnant or think they might be pregnant. There is a potential for serious side effects to an unborn child. Talk to your health care professional or pharmacist for more information. Do not breast-feed an infant while taking this medicine. Men may have a lower sperm count while taking this medicine. Talk to your doctor if you plan to father a child. What side effects may I notice from  receiving this medicine? Side effects that you should report to your doctor or health care professional as soon as possible: -allergic reactions like skin rash, itching or hives, swelling of the face, lips, or tongue -breathing problems -confusion or changes in emotions or moods -constipation -cough -mouth sores -muscle weakness -nausea and vomiting -pain, swelling, redness or irritation at the injection site -pain, tingling, numbness in the hands or feet -problems with balance, talking, walking -seizures -stomach pain -trouble passing urine or change in the amount of urine Side effects that usually do not require medical attention (report to your doctor or health care professional if they continue or are bothersome): -diarrhea -hair loss -jaw pain -loss of appetite This list may not describe all possible side effects. Call your doctor for medical advice about side effects. You may report side effects to FDA at 1-800-FDA-1088. Where should I keep my medicine? This drug is given in a hospital or clinic and will not be stored at home. NOTE: This sheet is a summary. It may not cover all possible information. If you have questions about this medicine, talk to your doctor, pharmacist, or health care provider.  2018 Elsevier/Gold Standard (2008-04-22 17:17:13) Cyclophosphamide injection What is this medicine? CYCLOPHOSPHAMIDE (sye kloe FOSS fa mide) is a chemotherapy drug. It slows the growth of cancer cells. This medicine is used to treat many types of cancer like lymphoma, myeloma, leukemia, breast cancer, and ovarian cancer, to name a few. This medicine may be used for other purposes; ask your health care provider or pharmacist if you have questions. COMMON BRAND NAME(S): Cytoxan, Neosar What should I tell my health care provider before I take this medicine? They need to know if you have any of these conditions: -blood disorders -history of other chemotherapy -infection -kidney  disease -liver disease -recent or ongoing radiation therapy -tumors in the bone marrow -an unusual or allergic reaction to cyclophosphamide, other chemotherapy, other medicines, foods, dyes, or preservatives -pregnant or trying to get pregnant -breast-feeding How should I use this medicine? This drug is usually given as an injection into a vein or muscle or by infusion into a vein. It is administered in a hospital or clinic by a specially trained health care professional. Talk to your pediatrician regarding the use of this medicine in children. Special care may be needed. Overdosage: If you think you have taken too much of this medicine contact a poison control center or emergency room at once. NOTE: This medicine is only for you. Do not share this medicine with others. What if I miss a dose? It is important not to miss your dose. Call your doctor or health care professional if you are unable to keep an appointment. What may interact with this medicine? This medicine may interact with the following medications: -amiodarone -amphotericin B -azathioprine -certain antiviral medicines for HIV or AIDS such as protease inhibitors (e.g., indinavir, ritonavir) and zidovudine -certain blood   pressure medications such as benazepril, captopril, enalapril, fosinopril, lisinopril, moexipril, monopril, perindopril, quinapril, ramipril, trandolapril -certain cancer medications such as anthracyclines (e.g., daunorubicin, doxorubicin), busulfan, cytarabine, paclitaxel, pentostatin, tamoxifen, trastuzumab -certain diuretics such as chlorothiazide, chlorthalidone, hydrochlorothiazide, indapamide, metolazone -certain medicines that treat or prevent blood clots like warfarin -certain muscle relaxants such as succinylcholine -cyclosporine -etanercept -indomethacin -medicines to increase blood counts like filgrastim, pegfilgrastim, sargramostim -medicines used as general  anesthesia -metronidazole -natalizumab This list may not describe all possible interactions. Give your health care provider a list of all the medicines, herbs, non-prescription drugs, or dietary supplements you use. Also tell them if you smoke, drink alcohol, or use illegal drugs. Some items may interact with your medicine. What should I watch for while using this medicine? Visit your doctor for checks on your progress. This drug may make you feel generally unwell. This is not uncommon, as chemotherapy can affect healthy cells as well as cancer cells. Report any side effects. Continue your course of treatment even though you feel ill unless your doctor tells you to stop. Drink water or other fluids as directed. Urinate often, even at night. In some cases, you may be given additional medicines to help with side effects. Follow all directions for their use. Call your doctor or health care professional for advice if you get a fever, chills or sore throat, or other symptoms of a cold or flu. Do not treat yourself. This drug decreases your body's ability to fight infections. Try to avoid being around people who are sick. This medicine may increase your risk to bruise or bleed. Call your doctor or health care professional if you notice any unusual bleeding. Be careful brushing and flossing your teeth or using a toothpick because you may get an infection or bleed more easily. If you have any dental work done, tell your dentist you are receiving this medicine. You may get drowsy or dizzy. Do not drive, use machinery, or do anything that needs mental alertness until you know how this medicine affects you. Do not become pregnant while taking this medicine or for 1 year after stopping it. Women should inform their doctor if they wish to become pregnant or think they might be pregnant. Men should not father a child while taking this medicine and for 4 months after stopping it. There is a potential for serious side  effects to an unborn child. Talk to your health care professional or pharmacist for more information. Do not breast-feed an infant while taking this medicine. This medicine may interfere with the ability to have a child. This medicine has caused ovarian failure in some women. This medicine has caused reduced sperm counts in some men. You should talk with your doctor or health care professional if you are concerned about your fertility. If you are going to have surgery, tell your doctor or health care professional that you have taken this medicine. What side effects may I notice from receiving this medicine? Side effects that you should report to your doctor or health care professional as soon as possible: -allergic reactions like skin rash, itching or hives, swelling of the face, lips, or tongue -low blood counts - this medicine may decrease the number of white blood cells, red blood cells and platelets. You may be at increased risk for infections and bleeding. -signs of infection - fever or chills, cough, sore throat, pain or difficulty passing urine -signs of decreased platelets or bleeding - bruising, pinpoint red spots on the skin, black, tarry stools, blood   in the urine -signs of decreased red blood cells - unusually weak or tired, fainting spells, lightheadedness -breathing problems -dark urine -dizziness -palpitations -swelling of the ankles, feet, hands -trouble passing urine or change in the amount of urine -weight gain -yellowing of the eyes or skin Side effects that usually do not require medical attention (report to your doctor or health care professional if they continue or are bothersome): -changes in nail or skin color -hair loss -missed menstrual periods -mouth sores -nausea, vomiting This list may not describe all possible side effects. Call your doctor for medical advice about side effects. You may report side effects to FDA at 1-800-FDA-1088. Where should I keep my  medicine? This drug is given in a hospital or clinic and will not be stored at home. NOTE: This sheet is a summary. It may not cover all possible information. If you have questions about this medicine, talk to your doctor, pharmacist, or health care provider.  2018 [employer]vier/Gold Standard (2012-06-09 16:22:58)  

## 2017-06-13 ENCOUNTER — Ambulatory Visit
Admission: RE | Admit: 2017-06-13 | Discharge: 2017-06-13 | Disposition: A | Discharge status: Home / Self Care | Payer: Medicare HMO | Source: Ambulatory Visit | Attending: Hematology and Oncology | Admitting: Hematology and Oncology

## 2017-06-13 ENCOUNTER — Other Ambulatory Visit (HOSPITAL_COMMUNITY)
Admission: RE | Admit: 2017-06-13 | Discharge: 2017-06-13 | Disposition: A | Discharge status: Home / Self Care | Payer: Medicare HMO | Source: Ambulatory Visit | Attending: Oncology | Admitting: Oncology

## 2017-06-13 DIAGNOSIS — D7589 Other specified diseases of blood and blood-forming organs: Secondary | ICD-10-CM | POA: Diagnosis not present

## 2017-06-13 DIAGNOSIS — C8510 Unspecified B-cell lymphoma, unspecified site: Secondary | ICD-10-CM | POA: Diagnosis present

## 2017-06-13 DIAGNOSIS — D72810 Lymphocytopenia: Secondary | ICD-10-CM | POA: Diagnosis not present

## 2017-06-13 DIAGNOSIS — C8330 Diffuse large B-cell lymphoma, unspecified site: Secondary | ICD-10-CM | POA: Diagnosis not present

## 2017-06-13 DIAGNOSIS — C8339 Diffuse large B-cell lymphoma, extranodal and solid organ sites: Secondary | ICD-10-CM | POA: Diagnosis not present

## 2017-06-13 LAB — PROTIME-INR
INR: 0.96
Prothrombin Time: 12.7 seconds (ref 11.4–15.2)

## 2017-06-13 LAB — CBC WITH DIFFERENTIAL/PLATELET
BASOS ABS: 0.1 10*3/uL (ref 0–0.1)
BASOS PCT: 1 %
EOS ABS: 0.3 10*3/uL (ref 0–0.7)
Eosinophils Relative: 5 %
HCT: 39.6 % — ABNORMAL LOW (ref 40.0–52.0)
HEMOGLOBIN: 14 g/dL (ref 13.0–18.0)
Lymphocytes Relative: 9 %
Lymphs Abs: 0.5 10*3/uL — ABNORMAL LOW (ref 1.0–3.6)
MCH: 30.8 pg (ref 26.0–34.0)
MCHC: 35.4 g/dL (ref 32.0–36.0)
MCV: 87.1 fL (ref 80.0–100.0)
MONO ABS: 0.5 10*3/uL (ref 0.2–1.0)
MONOS PCT: 9 %
NEUTROS PCT: 76 %
Neutro Abs: 4.5 10*3/uL (ref 1.4–6.5)
Platelets: 191 10*3/uL (ref 150–440)
RBC: 4.55 MIL/uL (ref 4.40–5.90)
RDW: 14.2 % (ref 11.5–14.5)
WBC: 5.8 10*3/uL (ref 3.8–10.6)

## 2017-06-13 LAB — APTT: APTT: 29 s (ref 24–36)

## 2017-06-13 MED ORDER — CEFAZOLIN SODIUM-DEXTROSE 2-4 GM/100ML-% IV SOLN
2.0000 g | Freq: Once | INTRAVENOUS | Status: AC
  Administered 2017-06-14: 2 g via INTRAVENOUS

## 2017-06-13 MED ORDER — MIDAZOLAM HCL 5 MG/5ML IJ SOLN
INTRAMUSCULAR | Status: AC | PRN
  Administered 2017-06-13: 1 mg via INTRAVENOUS

## 2017-06-13 MED ORDER — FENTANYL CITRATE (PF) 100 MCG/2ML IJ SOLN
INTRAMUSCULAR | Status: AC
  Filled 2017-06-13: qty 2

## 2017-06-13 MED ORDER — HEPARIN SOD (PORK) LOCK FLUSH 100 UNIT/ML IV SOLN
INTRAVENOUS | Status: AC
  Filled 2017-06-13: qty 5

## 2017-06-13 MED ORDER — BUPIVACAINE HCL (PF) 0.25 % IJ SOLN
INTRAMUSCULAR | Status: AC
  Filled 2017-06-13: qty 30

## 2017-06-13 MED ORDER — SODIUM CHLORIDE 0.9 % IV SOLN
INTRAVENOUS | Status: DC
  Administered 2017-06-13: 09:00:00 via INTRAVENOUS

## 2017-06-13 MED ORDER — BUPIVACAINE HCL (PF) 0.25 % IJ SOLN
INTRAMUSCULAR | Status: AC | PRN
  Administered 2017-06-13: 7 mL

## 2017-06-13 MED ORDER — HYDROCODONE-ACETAMINOPHEN 5-325 MG PO TABS: 1.0000 | ORAL_TABLET | ORAL | Status: DC | PRN

## 2017-06-13 MED ORDER — MIDAZOLAM HCL 5 MG/5ML IJ SOLN
INTRAMUSCULAR | Status: AC
  Filled 2017-06-13: qty 5

## 2017-06-13 NOTE — Procedures (Signed)
CT Bone Marrow biopsy without difficulty  Complications:  None  Blood Loss: none  See dictation in canopy pacs 

## 2017-06-14 ENCOUNTER — Encounter
Admission: RE | Disposition: A | Discharge status: Home / Self Care | Payer: Self-pay | Source: Ambulatory Visit | Attending: Vascular Surgery

## 2017-06-14 ENCOUNTER — Ambulatory Visit
Admission: RE | Admit: 2017-06-14 | Discharge: 2017-06-14 | Disposition: A | Discharge status: Home / Self Care | Payer: Medicare HMO | Source: Ambulatory Visit | Attending: Vascular Surgery | Admitting: Vascular Surgery

## 2017-06-14 ENCOUNTER — Encounter: Payer: Self-pay | Admitting: *Deleted

## 2017-06-14 DIAGNOSIS — Z960 Presence of urogenital implants: Secondary | ICD-10-CM | POA: Diagnosis not present

## 2017-06-14 DIAGNOSIS — Z807 Family history of other malignant neoplasms of lymphoid, hematopoietic and related tissues: Secondary | ICD-10-CM | POA: Insufficient documentation

## 2017-06-14 DIAGNOSIS — F17210 Nicotine dependence, cigarettes, uncomplicated: Secondary | ICD-10-CM | POA: Insufficient documentation

## 2017-06-14 DIAGNOSIS — Z801 Family history of malignant neoplasm of trachea, bronchus and lung: Secondary | ICD-10-CM | POA: Diagnosis not present

## 2017-06-14 DIAGNOSIS — Z79899 Other long term (current) drug therapy: Secondary | ICD-10-CM | POA: Insufficient documentation

## 2017-06-14 DIAGNOSIS — C8518 Unspecified B-cell lymphoma, lymph nodes of multiple sites: Secondary | ICD-10-CM | POA: Diagnosis not present

## 2017-06-14 DIAGNOSIS — R32 Unspecified urinary incontinence: Secondary | ICD-10-CM | POA: Insufficient documentation

## 2017-06-14 DIAGNOSIS — I10 Essential (primary) hypertension: Secondary | ICD-10-CM | POA: Diagnosis not present

## 2017-06-14 DIAGNOSIS — H918X9 Other specified hearing loss, unspecified ear: Secondary | ICD-10-CM | POA: Diagnosis not present

## 2017-06-14 DIAGNOSIS — Z800 Family history of malignant neoplasm of digestive organs: Secondary | ICD-10-CM | POA: Insufficient documentation

## 2017-06-14 DIAGNOSIS — Z8249 Family history of ischemic heart disease and other diseases of the circulatory system: Secondary | ICD-10-CM | POA: Insufficient documentation

## 2017-06-14 DIAGNOSIS — E785 Hyperlipidemia, unspecified: Secondary | ICD-10-CM | POA: Insufficient documentation

## 2017-06-14 DIAGNOSIS — Z9889 Other specified postprocedural states: Secondary | ICD-10-CM | POA: Insufficient documentation

## 2017-06-14 DIAGNOSIS — G4700 Insomnia, unspecified: Secondary | ICD-10-CM | POA: Insufficient documentation

## 2017-06-14 HISTORY — PX: PORTA CATH INSERTION: CATH118285

## 2017-06-14 LAB — SURGICAL PATHOLOGY

## 2017-06-14 SURGERY — PORTA CATH INSERTION
Anesthesia: Moderate Sedation

## 2017-06-14 MED ORDER — MIDAZOLAM HCL 5 MG/5ML IJ SOLN
INTRAMUSCULAR | Status: AC
  Filled 2017-06-14: qty 5

## 2017-06-14 MED ORDER — MIDAZOLAM HCL 2 MG/2ML IJ SOLN
INTRAMUSCULAR | Status: DC | PRN
  Administered 2017-06-14: 1 mg via INTRAVENOUS
  Administered 2017-06-14: 2 mg via INTRAVENOUS
  Administered 2017-06-14 (×2): 1 mg via INTRAVENOUS

## 2017-06-14 MED ORDER — SODIUM CHLORIDE 0.9 % IV SOLN
INTRAVENOUS | Status: DC
  Administered 2017-06-14: 12:00:00 via INTRAVENOUS

## 2017-06-14 MED ORDER — SODIUM CHLORIDE 0.9 % IR SOLN
Freq: Once | Status: DC
  Filled 2017-06-14 (×2): qty 2

## 2017-06-14 MED ORDER — CEFAZOLIN SODIUM-DEXTROSE 2-4 GM/100ML-% IV SOLN
INTRAVENOUS | Status: AC
  Filled 2017-06-14: qty 100

## 2017-06-14 MED ORDER — FENTANYL CITRATE (PF) 100 MCG/2ML IJ SOLN
INTRAMUSCULAR | Status: DC | PRN
Start: 2017-06-14 — End: 2017-06-14
  Administered 2017-06-14 (×2): 25 ug via INTRAVENOUS
  Administered 2017-06-14: 50 ug via INTRAVENOUS

## 2017-06-14 MED ORDER — HYDROMORPHONE HCL 1 MG/ML IJ SOLN: 1.0000 mg | Freq: Once | INTRAMUSCULAR | Status: DC | PRN

## 2017-06-14 MED ORDER — ONDANSETRON HCL 4 MG/2ML IJ SOLN: 4.0000 mg | Freq: Four times a day (QID) | INTRAMUSCULAR | Status: DC | PRN

## 2017-06-14 MED ORDER — LIDOCAINE-EPINEPHRINE (PF) 1 %-1:200000 IJ SOLN
INTRAMUSCULAR | Status: AC
  Filled 2017-06-14: qty 30

## 2017-06-14 MED ORDER — FENTANYL CITRATE (PF) 100 MCG/2ML IJ SOLN
INTRAMUSCULAR | Status: AC
  Filled 2017-06-14: qty 2

## 2017-06-14 SURGICAL SUPPLY — 11 items
DERMABOND ADVANCED (GAUZE/BANDAGES/DRESSINGS) ×1
DERMABOND ADVANCED .7 DNX12 (GAUZE/BANDAGES/DRESSINGS) ×1 IMPLANT
HANDLE YANKAUER SUCT BULB TIP (MISCELLANEOUS) ×2 IMPLANT
KIT PORT POWER 8FR ISP CVUE (Miscellaneous) ×2 IMPLANT
PACK ANGIOGRAPHY (CUSTOM PROCEDURE TRAY) ×2 IMPLANT
PAD GROUND ADULT SPLIT (MISCELLANEOUS) ×2 IMPLANT
PENCIL ELECTRO HAND CTR (MISCELLANEOUS) ×2 IMPLANT
SUT MNCRL AB 4-0 PS2 18 (SUTURE) ×2 IMPLANT
SUT PROLENE 0 CT 1 30 (SUTURE) ×2 IMPLANT
SUTURE VIC 3-0 (SUTURE) ×2 IMPLANT
TUBE CONNECTING VINYL 14FR 30C (MISCELLANEOUS) ×2 IMPLANT

## 2017-06-14 NOTE — H&P (Signed)
[facility] History & Physical Update  The patient was interviewed and re-examined.  The patient's previous History and Physical has been reviewed and is unchanged.  There is no change in the plan of care. We plan to proceed with the scheduled procedure.  Leotis Pain, MD  06/14/2017, 12:10 PM

## 2017-06-14 NOTE — Op Note (Signed)
      [place]       Operative Note  Date: 06/14/2017  Preoperative diagnosis:  1. Lymphoma  Postoperative diagnosis:  Same as above  Procedures: #1. Ultrasound guidance for vascular access to the right internal jugular vein. #2. Fluoroscopic guidance for placement of catheter. #3. Placement of CT compatible Port-A-Cath, right internal jugular vein.  Surgeon: Leotis Pain, MD.   Anesthesia: Local with moderate conscious sedation for approximately 25  minutes using 5 mg of Versed and 100 mcg of Fentanyl  Fluoroscopy time: less than 1 minute  Contrast used: 0  Estimated blood loss: 10 cc  Indication for the procedure:  The patient is a 71 y.o.male with lymphoma.  The patient needs a Port-A-Cath for durable venous access, chemotherapy, lab draws, and CT scans. We are asked to place this. Risks and benefits were discussed and informed consent was obtained.  Description of procedure: The patient was brought to the vascular and interventional radiology suite.  Moderate conscious sedation was administered throughout the procedure during a face to face encounter with the patient with my supervision of the RN administering medicines and monitoring the patient's vital signs, pulse oximetry, telemetry and mental status throughout from the start of the procedure until the patient was taken to the recovery room. The right neck chest and shoulder were sterilely prepped and draped, and a sterile surgical field was created. Ultrasound was used to help visualize a patent right internal jugular vein. This was then accessed under direct ultrasound guidance without difficulty with the Seldinger needle and a permanent image was recorded. A J-wire was placed. After skin nick and dilatation, the peel-away sheath was then placed over the wire. I then anesthetized an area under the clavicle approximately 1-2 fingerbreadths. A transverse incision was created and an inferior pocket was created  with electrocautery and blunt dissection. The port was then brought onto the field, placed into the pocket and secured to the chest wall with 2 Prolene sutures. The catheter was connected to the port and tunneled from the subclavicular incision to the access site. Fluoroscopic guidance was then used to cut the catheter to an appropriate length. The catheter was then placed through the peel-away sheath and the peel-away sheath was removed. The catheter tip was parked in excellent location under fluorocoscopic guidance in the cavoatrial junction. The pocket was then irrigated with antibiotic impregnated saline and the wound was closed with a running 3-0 Vicryl and a 4-0 Monocryl. The access incision was closed with a single 4-0 Monocryl. The Huber needle was used to withdraw blood and flush the port with heparinized saline. Dermabond was then placed as a dressing. The patient tolerated the procedure well and was taken to the recovery room in stable condition.   Leotis Pain 06/14/2017 2:32 PM   This note was created with Dragon Medical transcription system. Any errors in dictation are purely unintentional.

## 2017-06-15 ENCOUNTER — Encounter: Payer: Self-pay | Admitting: Oncology

## 2017-06-16 ENCOUNTER — Inpatient Hospital Stay: Payer: Medicare HMO

## 2017-06-17 ENCOUNTER — Telehealth: Payer: Self-pay | Admitting: *Deleted

## 2017-06-17 ENCOUNTER — Other Ambulatory Visit: Payer: Self-pay | Admitting: *Deleted

## 2017-06-17 DIAGNOSIS — C8518 Unspecified B-cell lymphoma, lymph nodes of multiple sites: Secondary | ICD-10-CM | POA: Insufficient documentation

## 2017-06-17 MED ORDER — PROCHLORPERAZINE MALEATE 10 MG PO TABS: 10.0000 mg | ORAL_TABLET | Freq: Four times a day (QID) | ORAL | 1 refills | Status: DC | PRN

## 2017-06-17 MED ORDER — LIDOCAINE-PRILOCAINE 2.5-2.5 % EX CREA: 1.0000 "application " | TOPICAL_CREAM | CUTANEOUS | 1 refills | Status: DC | PRN

## 2017-06-17 MED ORDER — ONDANSETRON HCL 8 MG PO TABS: 8.0000 mg | ORAL_TABLET | Freq: Three times a day (TID) | ORAL | 1 refills | Status: DC | PRN

## 2017-06-17 NOTE — Telephone Encounter (Signed)
Ive already spoke with her and gave her all the appt dates that she needs.

## 2017-06-17 NOTE — Telephone Encounter (Signed)
Wife called stating someone was to call her to let them know if he is going to get Chemotherapy Tuesday. He is on the schedule for it. Please advise.

## 2017-06-20 ENCOUNTER — Other Ambulatory Visit: Payer: Self-pay | Admitting: *Deleted

## 2017-06-20 ENCOUNTER — Other Ambulatory Visit: Payer: Self-pay | Admitting: Oncology

## 2017-06-20 DIAGNOSIS — C8518 Unspecified B-cell lymphoma, lymph nodes of multiple sites: Secondary | ICD-10-CM

## 2017-06-21 ENCOUNTER — Encounter: Payer: Self-pay | Admitting: Oncology

## 2017-06-21 ENCOUNTER — Inpatient Hospital Stay: Payer: Medicare HMO

## 2017-06-21 ENCOUNTER — Other Ambulatory Visit: Payer: Self-pay

## 2017-06-21 ENCOUNTER — Inpatient Hospital Stay (HOSPITAL_BASED_OUTPATIENT_CLINIC_OR_DEPARTMENT_OTHER): Payer: Medicare HMO | Admitting: Oncology

## 2017-06-21 VITALS — BP 127/70 | HR 56 | Temp 96.6°F | Wt 221.0 lb

## 2017-06-21 DIAGNOSIS — Z5111 Encounter for antineoplastic chemotherapy: Secondary | ICD-10-CM | POA: Diagnosis not present

## 2017-06-21 DIAGNOSIS — Z79899 Other long term (current) drug therapy: Secondary | ICD-10-CM | POA: Diagnosis not present

## 2017-06-21 DIAGNOSIS — C7951 Secondary malignant neoplasm of bone: Secondary | ICD-10-CM | POA: Diagnosis not present

## 2017-06-21 DIAGNOSIS — C8330 Diffuse large B-cell lymphoma, unspecified site: Secondary | ICD-10-CM

## 2017-06-21 DIAGNOSIS — Z7951 Long term (current) use of inhaled steroids: Secondary | ICD-10-CM | POA: Diagnosis not present

## 2017-06-21 DIAGNOSIS — F17210 Nicotine dependence, cigarettes, uncomplicated: Secondary | ICD-10-CM

## 2017-06-21 DIAGNOSIS — I10 Essential (primary) hypertension: Secondary | ICD-10-CM | POA: Diagnosis not present

## 2017-06-21 DIAGNOSIS — M7989 Other specified soft tissue disorders: Secondary | ICD-10-CM | POA: Diagnosis not present

## 2017-06-21 DIAGNOSIS — C8339 Diffuse large B-cell lymphoma, extranodal and solid organ sites: Secondary | ICD-10-CM | POA: Insufficient documentation

## 2017-06-21 DIAGNOSIS — C8518 Unspecified B-cell lymphoma, lymph nodes of multiple sites: Secondary | ICD-10-CM

## 2017-06-21 DIAGNOSIS — Z7689 Persons encountering health services in other specified circumstances: Secondary | ICD-10-CM | POA: Diagnosis not present

## 2017-06-21 DIAGNOSIS — D696 Thrombocytopenia, unspecified: Secondary | ICD-10-CM | POA: Diagnosis not present

## 2017-06-21 DIAGNOSIS — M109 Gout, unspecified: Secondary | ICD-10-CM | POA: Diagnosis not present

## 2017-06-21 LAB — COMPREHENSIVE METABOLIC PANEL
ALT: 12 U/L — AB (ref 17–63)
ANION GAP: 6 (ref 5–15)
AST: 17 U/L (ref 15–41)
Albumin: 3.9 g/dL (ref 3.5–5.0)
Alkaline Phosphatase: 95 U/L (ref 38–126)
BUN: 17 mg/dL (ref 6–20)
CHLORIDE: 104 mmol/L (ref 101–111)
CO2: 29 mmol/L (ref 22–32)
CREATININE: 0.96 mg/dL (ref 0.61–1.24)
Calcium: 9.2 mg/dL (ref 8.9–10.3)
Glucose, Bld: 126 mg/dL — ABNORMAL HIGH (ref 65–99)
POTASSIUM: 3.3 mmol/L — AB (ref 3.5–5.1)
SODIUM: 139 mmol/L (ref 135–145)
Total Bilirubin: 0.9 mg/dL (ref 0.3–1.2)
Total Protein: 7.3 g/dL (ref 6.5–8.1)

## 2017-06-21 LAB — CBC WITH DIFFERENTIAL/PLATELET
Basophils Absolute: 0.1 10*3/uL (ref 0–0.1)
Basophils Relative: 1 %
Eosinophils Absolute: 0.2 10*3/uL (ref 0–0.7)
Eosinophils Relative: 4 %
HCT: 38.1 % — ABNORMAL LOW (ref 40.0–52.0)
Hemoglobin: 13.4 g/dL (ref 13.0–18.0)
Lymphocytes Relative: 6 %
Lymphs Abs: 0.4 10*3/uL — ABNORMAL LOW (ref 1.0–3.6)
MCH: 30.5 pg (ref 26.0–34.0)
MCHC: 35.2 g/dL (ref 32.0–36.0)
MCV: 86.6 fL (ref 80.0–100.0)
Monocytes Absolute: 0.6 10*3/uL (ref 0.2–1.0)
Monocytes Relative: 9 %
Neutro Abs: 5.5 10*3/uL (ref 1.4–6.5)
Neutrophils Relative %: 80 %
Platelets: 173 10*3/uL (ref 150–440)
RBC: 4.4 MIL/uL (ref 4.40–5.90)
RDW: 13.4 % (ref 11.5–14.5)
WBC: 6.9 10*3/uL (ref 3.8–10.6)

## 2017-06-21 MED ORDER — VINCRISTINE SULFATE CHEMO INJECTION 1 MG/ML
2.0000 mg | Freq: Once | INTRAVENOUS | Status: AC
  Administered 2017-06-21: 2 mg via INTRAVENOUS
  Filled 2017-06-21: qty 2

## 2017-06-21 MED ORDER — PALONOSETRON HCL INJECTION 0.25 MG/5ML
0.2500 mg | Freq: Once | INTRAVENOUS | Status: AC
  Administered 2017-06-21: 0.25 mg via INTRAVENOUS
  Filled 2017-06-21: qty 5

## 2017-06-21 MED ORDER — DOXORUBICIN HCL CHEMO IV INJECTION 2 MG/ML
50.0000 mg/m2 | Freq: Once | INTRAVENOUS | Status: AC
  Administered 2017-06-21: 114 mg via INTRAVENOUS
  Filled 2017-06-21: qty 57

## 2017-06-21 MED ORDER — ACETAMINOPHEN 325 MG PO TABS
650.0000 mg | ORAL_TABLET | Freq: Once | ORAL | Status: AC
  Administered 2017-06-21: 650 mg via ORAL
  Filled 2017-06-21: qty 2

## 2017-06-21 MED ORDER — PREDNISONE 20 MG PO TABS: 80.0000 mg | ORAL_TABLET | Freq: Every day | ORAL | 0 refills | Status: DC

## 2017-06-21 MED ORDER — DIPHENHYDRAMINE HCL 25 MG PO CAPS
50.0000 mg | ORAL_CAPSULE | Freq: Once | ORAL | Status: AC
  Administered 2017-06-21: 50 mg via ORAL
  Filled 2017-06-21: qty 2

## 2017-06-21 MED ORDER — SODIUM CHLORIDE 0.9 % IV SOLN
375.0000 mg/m2 | Freq: Once | INTRAVENOUS | Status: AC
  Administered 2017-06-21: 800 mg via INTRAVENOUS
  Filled 2017-06-21: qty 50

## 2017-06-21 MED ORDER — SODIUM CHLORIDE 0.9 % IV SOLN
Freq: Once | INTRAVENOUS | Status: AC
  Administered 2017-06-21: 10:00:00 via INTRAVENOUS
  Filled 2017-06-21: qty 1000

## 2017-06-21 MED ORDER — SODIUM CHLORIDE 0.9 % IV SOLN
20.0000 mg | Freq: Once | INTRAVENOUS | Status: AC
  Administered 2017-06-21: 20 mg via INTRAVENOUS
  Filled 2017-06-21: qty 2

## 2017-06-21 MED ORDER — SODIUM CHLORIDE 0.9 % IV SOLN
750.0000 mg/m2 | Freq: Once | INTRAVENOUS | Status: AC
  Administered 2017-06-21: 1700 mg via INTRAVENOUS
  Filled 2017-06-21: qty 85

## 2017-06-21 MED ORDER — HEPARIN SOD (PORK) LOCK FLUSH 100 UNIT/ML IV SOLN
500.0000 [IU] | Freq: Once | INTRAVENOUS | Status: AC | PRN
  Administered 2017-06-21: 500 [IU]
  Filled 2017-06-21: qty 5

## 2017-06-21 MED ORDER — CHLORHEXIDINE GLUCONATE 0.12 % MT SOLN: 15.0000 mL | Freq: Two times a day (BID) | OROMUCOSAL | 0 refills | Status: DC

## 2017-06-21 NOTE — Progress Notes (Signed)
Hematology/Oncology follow-up note [facility] Telephone:([phone] Fax:([phone]   Patient Care Team: Tracie Harrier, MD as PCP - General (Internal Medicine)  REFERRING PROVIDER: Tracie Harrier, MD  REASON FOR VISIT Follow up for treatment of diffuse large B cell lymphoma.   HISTORY OF PRESENTING ILLNESS:  John Gallegos is a  71 y.o.  male with PMH listed below presented for follow-up for treatment of diffuse large B cell lymphoma.  Pertinent history of oncology workup includes: Patient was found to have microscopic hematuria on 02/23/2017 with 4-10 RBC's/hpf.    Urine culture was negative.having symptoms of nocturia He is smoker. 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 He was in the [employer] and reports to be exposed to [place] in [place]. CT scan 04/20/2017 which revealed moderate left hydronephrosis and delayed excretion down to a large 4.4 cm mass in or around the left proximal ureter. There was also hazy soft tissue in the fat surrounding the celiac axis and superior mesenteric artery. Thickening of the right diaphragmatic crus. High gastrohepatic ligament lymphadenopathy measures up to 11 mm. Peripancreatic and root of small bowel mesentery adenopathy measures up to 2.6 cm.  He was seen by urologist Dr. Pilar Jarvis and he underwent cystoscopy, left retrograde pyelogram, left ureteroscopy and placement of left ureteral stent placement on 05/13/2017. Finding during the procedure that he has extrinsic compression of the ureter, possibly from lymphoma.   PET scan 05/18/2017  IMPRESSION: 1. Multifocal metastatic disease within the LEFT retroperitoneum, central mesenteries, and RIGHT paraspinal musculature. 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 Recommend percutaneous biopsy of the RIGHT paraspinal musculature at L2. 2. 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  # Pathology:  Biopsy of paraspinal musculature  at L2. Pathology results reviewed large B cell lymphoma. It is CD20 positive, CD10 positive. Flow cytometry shows a small clonal population of large B cells positive for CD10 and absent cop and lambda light chains. The abnormal cells represent 2% of the total cells.The morphology, initial IHC panel, and flow cytometry supports classification as B-cell lymphoma, CD10 positive, with large cell features.diffuse large B-cell lymphoma, germinal center B-cell type.  Discussed with pathologist Dr.Onley, Ki67 50%, MUM1 negative, MYC negative, Cycline D1 negative, CD30 negative, FISH is positive for BCL2 gene rearrangement. Negative for MYC and BCL6. 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   # Bone marrow Biopsy: negative for lymphoma involvement.  [name] for evaluation prior to chemotherapy.  He denies any B symptoms, feeling well at baseline.   Review of Systems  Constitutional: Negative for fever, night sweats,change in appetite. unintentional weight loss,  HENT: Negative for ear pain, hearing loss, nasal bleeding Eyes: Negative for eye pain, double vision   Respiratory: Negative for wheezing, shortness of breath, cough Cardiovascular: Negative for chest pain, palpitation.   Gastrointestinal: Negative abdominal pain, diarrhea, nausea vomiting Endocrine: Negative  Genitourinary: Negative for dysuria, hematuria, frequency Skin: Negative for rash, iching, bruising Neurological: Negative for headache, dizziness, seizure Hematological: Negative for easy bruising/bleeding, lymph node enlargement Psychiatric/Behavioral: Negative for depression, anxiety, suicidality  MEDICAL HISTORY:  Past Medical History:  Diagnosis Date  . Gout   . Hard of hearing   . Hypertension   . Urinary incontinence     SURGICAL HISTORY: Past Surgical History:  Procedure Laterality Date  . 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 right knne surgery     needle went  through knee  . TONSILLECTOMY      SOCIAL HISTORY: Social History   Socioeconomic History  . Marital status: Married    Spouse name: Not on file  . Number of children: Not on file  . Years of education: Not on file  . Highest education level: Not on file  Social Needs  . Financial resource strain: Not on file  . Food insecurity - worry: Not on file  . Food insecurity - inability: Not on file  . Transportation needs - medical: Not on file  . Transportation needs - non-medical: Not on file  Occupational History  . Not on file  Tobacco Use  . Smoking status: Current Every Day Smoker    Packs/day: 0.50    Years: 50.00    Pack years: 25.00    Types: Cigarettes  . Smokeless tobacco: Never Used  Substance and Sexual Activity  . Alcohol use: Yes    Alcohol/week: 1.8 oz    Types: 3 Shots of liquor per week  . Drug use: No  . Sexual activity: Not on file  Other Topics Concern  . Not on file  Social History Narrative  . Not on file    FAMILY HISTORY: Family History  Problem Relation Age of Onset  . Pancreatic cancer Mother   . Aneurysm Mother   . Lung cancer Father   . Lymphoma Sister   . Prostate cancer Neg Hx   . Kidney cancer Neg Hx   . Bladder Cancer Neg Hx     ALLERGIES:  has No Known Allergies.  MEDICATIONS:  Current Outpatient Medications  Medication Sig Dispense Refill  . amLODipine (NORVASC) 10 MG tablet Take 10 mg by mouth every evening.     Marland Kitchen aspirin EC 81 MG tablet Take 81 mg by mouth daily.    . hydrALAZINE (APRESOLINE) 25 MG tablet Take 25 mg by mouth every evening.     . lidocaine-prilocaine (EMLA) cream Apply 1 application as needed topically. 30 g 1  . losartan-hydrochlorothiazide (HYZAAR) 100-25 MG tablet Take 1 tablet by mouth daily.    . ondansetron (ZOFRAN) 8 MG tablet Take 1 tablet (8 mg total) every 8 (eight) hours as needed by mouth for nausea or vomiting. 30 tablet 1  . pravastatin (PRAVACHOL) 80 MG tablet Take 80 mg by mouth 2 (two) times  a week.     . prochlorperazine (COMPAZINE) 10 MG tablet Take 1 tablet (10 mg total) every 6 (six) hours as needed by mouth for nausea or vomiting. 30 tablet 1  . zolpidem (AMBIEN) 10 MG tablet Take 10 mg by mouth at bedtime.     . chlorhexidine (PERIDEX) 0.12 % solution Use as directed 15 mLs 2 (two) times daily in the mouth or throat. 1893 mL 0   No current facility-administered medications for this visit.    Facility-Administered Medications Ordered in Other Visits  Medication Dose Route Frequency Provider Last Rate Last Dose  . cyclophosphamide (CYTOXAN) 1,700 mg in sodium chloride 0.9 % 250 mL chemo infusion  750 mg/m2 (Treatment Plan Recorded) Intravenous Once Earlie Server, MD 670 mL/hr at 06/21/17 1132 1,700 mg at 06/21/17 1132  . heparin lock flush 100 unit/mL  500 Units Intracatheter Once PRN Earlie Server, MD      . riTUXimab (RITUXAN) 800 mg in sodium chloride 0.9 % 250 mL (2.4242 mg/mL) chemo infusion  375 mg/m2 (Treatment Plan Recorded) Intravenous Once Earlie Server, MD          .  PHYSICAL  EXAMINATION: ECOG PERFORMANCE STATUS: 0 - Asymptomatic Vitals:   06/21/17 0842  BP: 127/70  Pulse: (!) 56  Temp: (!) 96.6 F (35.9 C)   Filed Weights   06/21/17 0842  Weight: 221 lb (100.2 kg)     LABORATORY DATA:  I have reviewed the data as listed Lab Results  Component Value Date   WBC 6.9 06/21/2017   HGB 13.4 06/21/2017   HCT 38.1 (L) 06/21/2017   MCV 86.6 06/21/2017   PLT 173 06/21/2017   Recent Labs    05/18/17 1522 06/09/17 1038 06/21/17 0806  NA 140 138 139  K 3.3* 3.4* 3.3*  CL 103 104 104  CO2 _0 GLUCOSE 106* 115* 126*  BUN _1 CREATININE 0.99 0.99 0.96  CALCIUM 9.4 9.2 9.2  GFRNONAA >60 >60 >60  GFRAA >60 >60 >60  PROT 7.6  --  7.3  ALBUMIN 4.0  --  3.9  AST 15  --  17  ALT 11*  --  12*  ALKPHOS 92  --  95  BILITOT 0.7  --  0.9    RADIOGRAPHIC STUDIES:O I have personally reviewed the radiological images as listed and agreed with the findings  in the report. 05/26/2017 PET scan IMPRESSION: 1. Multifocal metastatic disease within the LEFT retroperitoneum,central mesenteries, and RIGHT paraspinal musculature. 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 Recommend percutaneous biopsy of theRIGHT paraspinal musculature at L2. 2. 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  Hepatitis B antigen negative. 2-D echo.showed LVEF 60%  ASSESSMENT & PLAN:  1. Diffuse large B-cell lymphoma of extranodal site excluding spleen and other solid organs ([place])   2. Encounter for antineoplastic chemotherapy    Stage IV given multiple extranodal involvement.  I explained to the patient the risks and benefits of chemotherapy including all but not limited to hair loss, mouth sore, nausea, vomiting, low blood counts, bleeding, and risk of life threatening infection and even death, secondary malignancy etc.  Patient voices understanding and willing to proceed.   -Plan R CHOP x 6. PET scan after 2 cycle of RCHOP. He has been to chemotherapy class.  -He will get Dexamethasone 26m on Day 1,  Need to take Prednisone 80 mg daily Day 2-5.  -Antiemetics-Zofran and Compazine; EMLA cream sent to pharmacy -Return on Day 3 of chemotherapy for prophylactic Neulasta.   -Starting Cycle 2 plan Onpro if approved by insurance.  -Peridex mouth wash swish and spit Rx sent to pharmacy.   The patient knows to call the clinic with any problems questions or concerns.  Return of visit: lab/MD 1 week to evaluate toxicity.   ZEarlie Server MD, PhD Hematology Oncology [facility]at AArkansas Methodist Medical CenterPager- [phone]/13/2018

## 2017-06-21 NOTE — Progress Notes (Signed)
START ON PATHWAY REGIMEN - Lymphoma and CLL     A cycle is every 21 days:     Rituximab      Cyclophosphamide      Doxorubicin      Vincristine      Prednisone   **Always confirm dose/schedule in your pharmacy ordering system**    Patient Characteristics: Diffuse Large B-Cell Lymphoma, First Line, Stage III and IV Disease Type: Not Applicable Disease Type: Diffuse Large B-Cell Lymphoma Disease Type: Not Applicable Line of therapy: First Line [place] Stage: IV Intent of Therapy: Curative Intent, Discussed with Patient

## 2017-06-21 NOTE — Progress Notes (Signed)
Patient here today for follow up and first chemotherapy RCHOP.  Patient states no new concerns today

## 2017-06-23 ENCOUNTER — Inpatient Hospital Stay: Payer: Medicare HMO

## 2017-06-23 DIAGNOSIS — C8339 Diffuse large B-cell lymphoma, extranodal and solid organ sites: Secondary | ICD-10-CM

## 2017-06-23 DIAGNOSIS — C7951 Secondary malignant neoplasm of bone: Secondary | ICD-10-CM | POA: Diagnosis not present

## 2017-06-23 DIAGNOSIS — Z7689 Persons encountering health services in other specified circumstances: Secondary | ICD-10-CM | POA: Diagnosis not present

## 2017-06-23 DIAGNOSIS — C8330 Diffuse large B-cell lymphoma, unspecified site: Secondary | ICD-10-CM | POA: Diagnosis not present

## 2017-06-23 DIAGNOSIS — F17210 Nicotine dependence, cigarettes, uncomplicated: Secondary | ICD-10-CM | POA: Diagnosis not present

## 2017-06-23 DIAGNOSIS — M7989 Other specified soft tissue disorders: Secondary | ICD-10-CM | POA: Diagnosis not present

## 2017-06-23 DIAGNOSIS — I10 Essential (primary) hypertension: Secondary | ICD-10-CM | POA: Diagnosis not present

## 2017-06-23 DIAGNOSIS — M109 Gout, unspecified: Secondary | ICD-10-CM | POA: Diagnosis not present

## 2017-06-23 DIAGNOSIS — D696 Thrombocytopenia, unspecified: Secondary | ICD-10-CM | POA: Diagnosis not present

## 2017-06-23 DIAGNOSIS — Z5111 Encounter for antineoplastic chemotherapy: Secondary | ICD-10-CM | POA: Diagnosis not present

## 2017-06-23 MED ORDER — PEGFILGRASTIM INJECTION 6 MG/0.6ML ~~LOC~~
6.0000 mg | PREFILLED_SYRINGE | Freq: Once | SUBCUTANEOUS | Status: AC
  Administered 2017-06-23: 6 mg via SUBCUTANEOUS

## 2017-06-28 ENCOUNTER — Other Ambulatory Visit: Payer: Self-pay

## 2017-06-28 ENCOUNTER — Inpatient Hospital Stay: Payer: Medicare HMO

## 2017-06-28 ENCOUNTER — Inpatient Hospital Stay (HOSPITAL_BASED_OUTPATIENT_CLINIC_OR_DEPARTMENT_OTHER): Payer: Medicare HMO | Admitting: Oncology

## 2017-06-28 ENCOUNTER — Ambulatory Visit: Payer: Self-pay

## 2017-06-28 ENCOUNTER — Ambulatory Visit
Admission: RE | Admit: 2017-06-28 | Discharge: 2017-06-28 | Disposition: A | Discharge status: Home / Self Care | Payer: Medicare HMO | Source: Ambulatory Visit | Attending: Oncology | Admitting: Oncology

## 2017-06-28 ENCOUNTER — Encounter: Payer: Self-pay | Admitting: Oncology

## 2017-06-28 ENCOUNTER — Encounter (HOSPITAL_COMMUNITY): Payer: Self-pay

## 2017-06-28 VITALS — BP 137/70 | HR 46 | Temp 96.9°F | Wt 225.4 lb

## 2017-06-28 DIAGNOSIS — I10 Essential (primary) hypertension: Secondary | ICD-10-CM | POA: Diagnosis not present

## 2017-06-28 DIAGNOSIS — Z5111 Encounter for antineoplastic chemotherapy: Secondary | ICD-10-CM | POA: Diagnosis not present

## 2017-06-28 DIAGNOSIS — C8339 Diffuse large B-cell lymphoma, extranodal and solid organ sites: Secondary | ICD-10-CM

## 2017-06-28 DIAGNOSIS — Z79899 Other long term (current) drug therapy: Secondary | ICD-10-CM

## 2017-06-28 DIAGNOSIS — D696 Thrombocytopenia, unspecified: Secondary | ICD-10-CM | POA: Diagnosis not present

## 2017-06-28 DIAGNOSIS — C7951 Secondary malignant neoplasm of bone: Secondary | ICD-10-CM

## 2017-06-28 DIAGNOSIS — M7989 Other specified soft tissue disorders: Secondary | ICD-10-CM

## 2017-06-28 DIAGNOSIS — M109 Gout, unspecified: Secondary | ICD-10-CM | POA: Diagnosis not present

## 2017-06-28 DIAGNOSIS — Z7689 Persons encountering health services in other specified circumstances: Secondary | ICD-10-CM | POA: Diagnosis not present

## 2017-06-28 DIAGNOSIS — C8330 Diffuse large B-cell lymphoma, unspecified site: Secondary | ICD-10-CM | POA: Diagnosis not present

## 2017-06-28 DIAGNOSIS — F17210 Nicotine dependence, cigarettes, uncomplicated: Secondary | ICD-10-CM | POA: Diagnosis not present

## 2017-06-28 LAB — COMPREHENSIVE METABOLIC PANEL
ALK PHOS: 98 U/L (ref 38–126)
ALT: 17 U/L (ref 17–63)
ANION GAP: 7 (ref 5–15)
AST: 15 U/L (ref 15–41)
Albumin: 3.7 g/dL (ref 3.5–5.0)
BILIRUBIN TOTAL: 0.8 mg/dL (ref 0.3–1.2)
BUN: 23 mg/dL — ABNORMAL HIGH (ref 6–20)
CALCIUM: 9.1 mg/dL (ref 8.9–10.3)
CO2: 32 mmol/L (ref 22–32)
CREATININE: 1 mg/dL (ref 0.61–1.24)
Chloride: 100 mmol/L — ABNORMAL LOW (ref 101–111)
Glucose, Bld: 102 mg/dL — ABNORMAL HIGH (ref 65–99)
Potassium: 4.2 mmol/L (ref 3.5–5.1)
Sodium: 139 mmol/L (ref 135–145)
TOTAL PROTEIN: 6.6 g/dL (ref 6.5–8.1)

## 2017-06-28 LAB — CHROMOSOME ANALYSIS, BONE MARROW

## 2017-06-28 LAB — CBC WITH DIFFERENTIAL/PLATELET
BASOS ABS: 0 10*3/uL (ref 0–0.1)
BASOS PCT: 1 %
EOS ABS: 0.1 10*3/uL (ref 0–0.7)
Eosinophils Relative: 6 %
HEMATOCRIT: 36.5 % — AB (ref 40.0–52.0)
HEMOGLOBIN: 12.7 g/dL — AB (ref 13.0–18.0)
Lymphocytes Relative: 12 %
Lymphs Abs: 0.3 10*3/uL — ABNORMAL LOW (ref 1.0–3.6)
MCH: 30.3 pg (ref 26.0–34.0)
MCHC: 34.8 g/dL (ref 32.0–36.0)
MCV: 87.3 fL (ref 80.0–100.0)
MONO ABS: 0.1 10*3/uL — AB (ref 0.2–1.0)
Monocytes Relative: 4 %
NEUTROS ABS: 1.6 10*3/uL (ref 1.4–6.5)
NEUTROS PCT: 77 %
Platelets: 97 10*3/uL — ABNORMAL LOW (ref 150–440)
RBC: 4.18 MIL/uL — ABNORMAL LOW (ref 4.40–5.90)
RDW: 13.6 % (ref 11.5–14.5)
WBC: 2.1 10*3/uL — ABNORMAL LOW (ref 3.8–10.6)

## 2017-06-28 NOTE — Progress Notes (Signed)
Patient here today for follow up.  Patient c/o constipation, leg swelling and a mild sore throat

## 2017-06-28 NOTE — Progress Notes (Addendum)
Hematology/Oncology follow-up note [facility] [phone] [phone]   Patient Care Team: Tracie Harrier, MD as PCP - General (Internal Medicine)  REFERRING PROVIDER: Tracie Harrier, MD  REASON FOR VISIT Follow up for treatment of diffuse large B cell lymphoma.   HISTORY OF PRESENTING ILLNESS:  John Gallegos is a  71 y.o.  male with PMH listed below presented for follow-up for treatment of diffuse large B cell lymphoma.  Pertinent history of oncology workup includes: Patient was found to have microscopic hematuria on 02/23/2017 with 4-10 RBC's/hpf.    Urine culture was negative.having symptoms of nocturia He is smoker. 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 He was in the [employer] and reports to be exposed to [place] in [place]. CT scan 04/20/2017 which revealed moderate left hydronephrosis and delayed excretion down to a large 4.4 cm mass in or around the left proximal ureter. There was also hazy soft tissue in the fat surrounding the celiac axis and superior mesenteric artery. Thickening of the right diaphragmatic crus. High gastrohepatic ligament lymphadenopathy measures up to 11 mm. Peripancreatic and root of small bowel mesentery adenopathy measures up to 2.6 cm.  He was seen by urologist Dr. Pilar Jarvis and he underwent cystoscopy, left retrograde pyelogram, left ureteroscopy and placement of left ureteral stent placement on 05/13/2017. Finding during the procedure that he has extrinsic compression of the ureter, possibly from lymphoma.   PET scan 05/18/2017  IMPRESSION: 1. Multifocal metastatic disease within the LEFT retroperitoneum, central mesenteries, and RIGHT paraspinal musculature. 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 Recommend percutaneous biopsy of the RIGHT paraspinal musculature at L2. 2. 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  # Pathology:  Biopsy of paraspinal musculature  at L2. Pathology results reviewed large B cell lymphoma. It is CD20 positive, CD10 positive. Flow cytometry shows a small clonal population of large B cells positive for CD10 and absent cop and lambda light chains. The abnormal cells represent 2% of the total cells.The morphology, initial IHC panel, and flow cytometry supports classification as B-cell lymphoma, CD10 positive, with large cell features.diffuse large B-cell lymphoma, germinal center B-cell type.  Discussed with pathologist Dr.Onley, Ki67 50%, MUM1 negative, MYC negative, Cycline D1 negative, CD30 negative, FISH is positive for BCL2 gene rearrangement. Negative for MYC and BCL6. 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   # Bone marrow Biopsy: negative for lymphoma involvement.  # Treatment:  06/21/2017 Cycle 1 RCHOP [name]. He gets Dexamethasone 29m on Day 1,  Need to take Prednisone 80 mg daily Day 2-5.  INTERVAL HISTORY [name] for evaluation of toxicity of chemotherapy. Patient reports feeling well, denies profound fatigue, fever or chills. He mentioned that last night his right lower extremity was very swollen, and this morning get slightly better. Left lower extremity also started to have some swelling. Denies any shortness of breath, cough, chest pain, abdominal pain.  Review of Systems  Review of Systems  Constitutional: Negative for chills, fever and weight loss.  HENT: Negative for hearing loss.   Eyes: Negative for blurred vision.  Respiratory: Negative for cough, hemoptysis and wheezing.   Cardiovascular: Positive for leg swelling. Negative for chest pain.  Gastrointestinal: Negative for heartburn.  Genitourinary: Negative for dysuria.  Musculoskeletal: Negative for myalgias.  Skin: Negative for rash.  Neurological: Negative for dizziness.  Endo/Heme/Allergies: Does not bruise/bleed easily.  Psychiatric/Behavioral: Negative for depression.   MEDICAL  HISTORY:  Past Medical History:  Diagnosis Date  . Gout   . Hard of hearing   . Hypertension   . Urinary incontinence     SURGICAL HISTORY: Past Surgical History:  Procedure Laterality Date  . CYSTOSCOPY WITH STENT PLACEMENT Left 05/13/2017   Performed by Nickie Retort, MD at [facility] ORS  . CYSTOSCOPY WITH URETERAL BIOPSY Left 05/13/2017   Performed by Nickie Retort, MD at [facility] ORS  . PORTA CATH INSERTION N/A 06/14/2017   Performed by Algernon Huxley, MD at [facility]  . pyleostenosis     49 weeks old  . right knne surgery     needle went through knee  . TONSILLECTOMY    . URETEROSCOPY Left 05/13/2017   Performed by Nickie Retort, MD at [facility] ORS    SOCIAL HISTORY: Social History   Socioeconomic History  . Marital status: Married    Spouse name: Not on file  . Number of children: Not on file  . Years of education: Not on file  . Highest education level: Not on file  Social Needs  . Financial resource strain: Not on file  . Food insecurity - worry: Not on file  . Food insecurity - inability: Not on file  . Transportation needs - medical: Not on file  . Transportation needs - non-medical: Not on file  Occupational History  . Not on file  Tobacco Use  . Smoking status: Current Every Day Smoker    Packs/day: 0.50    Years: 50.00    Pack years: 25.00    Types: Cigarettes  . Smokeless tobacco: Never Used  Substance and Sexual Activity  . Alcohol use: Yes    Alcohol/week: 1.8 oz    Types: 3 Shots of liquor per week  . Drug use: No  . Sexual activity: Not on file  Other Topics Concern  . Not on file  Social History Narrative  . Not on file    FAMILY HISTORY: Family History  Problem Relation Age of Onset  . Pancreatic cancer Mother   . Aneurysm Mother   . Lung cancer Father   . Lymphoma Sister   . Prostate cancer Neg Hx   . Kidney cancer Neg Hx   . Bladder Cancer Neg Hx     ALLERGIES:  has No Known Allergies.  MEDICATIONS:  Current  Outpatient Medications  Medication Sig Dispense Refill  . amLODipine (NORVASC) 10 MG tablet Take 10 mg by mouth every evening.     Marland Kitchen aspirin EC 81 MG tablet Take 81 mg by mouth daily.    . chlorhexidine (PERIDEX) 0.12 % solution Use as directed 15 mLs 2 (two) times daily in the mouth or throat. 1893 mL 0  . hydrALAZINE (APRESOLINE) 25 MG tablet Take 25 mg by mouth every evening.     . lidocaine-prilocaine (EMLA) cream Apply 1 application as needed topically. 30 g 1  . losartan-hydrochlorothiazide (HYZAAR) 100-25 MG tablet Take 1 tablet by mouth daily.    . ondansetron (ZOFRAN) 8 MG tablet Take 1 tablet (8 mg total) every 8 (eight) hours as needed by mouth for nausea or vomiting. 30 tablet 1  . pravastatin (PRAVACHOL) 80 MG tablet Take 80 mg by mouth 2 (two) times a week.     . predniSONE (DELTASONE) 20 MG tablet Take 4 tablets (80 mg total) daily with breakfast by mouth. Day 2-5 of each cycle 96 tablet 0  . prochlorperazine (COMPAZINE) 10 MG tablet Take 1 tablet (10  mg total) every 6 (six) hours as needed by mouth for nausea or vomiting. 30 tablet 1  . zolpidem (AMBIEN) 10 MG tablet Take 10 mg by mouth at bedtime.      No current facility-administered medications for this visit.       Marland Kitchen  PHYSICAL EXAMINATION: ECOG PERFORMANCE STATUS: 0 - Asymptomatic Vitals:   06/28/17 0932  BP: 137/70  Pulse: (!) 46  Temp: (!) 96.9 F (36.1 C)   Filed Weights   06/28/17 0932  Weight: 225 lb 6 oz (102.2 kg)   Physical Exam  Constitutional: He is oriented to person, place, and time and well-developed, well-nourished, and in no distress. No distress.  HENT:  Head: Normocephalic and atraumatic.  Eyes: Conjunctivae and EOM are normal. Pupils are equal, round, and reactive to light.  Neck: Normal range of motion. Neck supple.  Cardiovascular: Normal rate, regular rhythm and normal heart sounds.  Pulmonary/Chest: Effort normal and breath sounds normal.  Abdominal: Soft. Bowel sounds are normal. He  exhibits no distension.  Musculoskeletal: Normal range of motion. He exhibits edema.  Lymphadenopathy:    He has no cervical adenopathy.  Neurological: He is alert and oriented to person, place, and time.  Skin: Skin is warm and dry.  Psychiatric: Affect normal.    LABORATORY DATA:  I have reviewed the data as listed Lab Results  Component Value Date   WBC 6.9 06/21/2017   HGB 13.4 06/21/2017   HCT 38.1 (L) 06/21/2017   MCV 86.6 06/21/2017   PLT 173 06/21/2017   Recent Labs    05/18/17 1522 06/09/17 1038 06/21/17 0806  NA 140 138 139  K 3.3* 3.4* 3.3*  CL 103 104 104  CO2 '28 30 29  ' GLUCOSE 106* 115* 126*  BUN '16 17 17  ' CREATININE 0.99 0.99 0.96  CALCIUM 9.4 9.2 9.2  GFRNONAA >60 >60 >60  GFRAA >60 >60 >60  PROT 7.6  --  7.3  ALBUMIN 4.0  --  3.9  AST 15  --  17  ALT 11*  --  12*  ALKPHOS 92  --  95  BILITOT 0.7  --  0.9    RADIOGRAPHIC STUDIES:O I have personally reviewed the radiological images as listed and agreed with the findings in the report. 05/26/2017 PET scan IMPRESSION: 1. Multifocal metastatic disease within the LEFT retroperitoneum,central mesenteries, and RIGHT paraspinal musculature. 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 Recommend percutaneous biopsy of theRIGHT paraspinal musculature at L2. 2. 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  Hepatitis B antigen negative. 2-D echo.showed LVEF 60%  ASSESSMENT & PLAN:  This is a 70 year old male with stage IV diffuse large B-cell lymphoma, on first-line chemotherapy with R CHOP with curative intent, present for follow-up.  1. Diffuse large B-cell lymphoma of extranodal site excluding spleen and other solid organs ([place])   2. Swelling of extremity   3. Thrombocytopenia ([place])    - Tolerated cycle one R CHOP well.  - Grade 1 thrombocytopenia likely secondary to chemotherapy. No active bleeding continue to monitor.  -Plan R CHOP x 6. PET scan after 2 cycle of RCHOP. He  has been to chemotherapy class.  --Starting Cycle 2 plan Onpro if approved by insurance.  -Continue Peridex mouth wash swish and spit  - Leg swelling possibly secondary to being on steroids. Lung is clear, No signs of volume overload.  Rule out DVT. The patient knows to call the clinic with any problems questions or concerns.  Return of visit: lab/MD/RCHOP/Onpro in 2 week   Earlie Server, MD, PhD Hematology Oncology [facility] [place] at [facility] Pager- [phone] 06/28/2017

## 2017-07-07 DIAGNOSIS — I10 Essential (primary) hypertension: Secondary | ICD-10-CM | POA: Diagnosis not present

## 2017-07-07 DIAGNOSIS — M1990 Unspecified osteoarthritis, unspecified site: Secondary | ICD-10-CM | POA: Diagnosis not present

## 2017-07-07 DIAGNOSIS — E7800 Pure hypercholesterolemia, unspecified: Secondary | ICD-10-CM | POA: Diagnosis not present

## 2017-07-07 DIAGNOSIS — F5104 Psychophysiologic insomnia: Secondary | ICD-10-CM | POA: Diagnosis not present

## 2017-07-11 ENCOUNTER — Other Ambulatory Visit: Payer: Self-pay | Admitting: *Deleted

## 2017-07-11 DIAGNOSIS — C8339 Diffuse large B-cell lymphoma, extranodal and solid organ sites: Secondary | ICD-10-CM

## 2017-07-11 NOTE — Progress Notes (Signed)
Hematology/Oncology follow-up note [facility] [phone] [phone]   Patient Care Team: Tracie Harrier, MD as PCP - General (Internal Medicine)  REFERRING PROVIDER: Tracie Harrier, MD  REASON FOR VISIT Follow up for treatment of diffuse large B cell lymphoma.   HISTORY OF PRESENTING ILLNESS:  John Gallegos is a  71 y.o.  male with PMH listed below presented for follow-up for treatment of diffuse large B cell lymphoma.  Pertinent history of oncology workup includes: Patient was found to have microscopic hematuria on 02/23/2017 with 4-10 RBC's/hpf.    Urine culture was negative.having symptoms of nocturia He is smoker. 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 He was in the [employer] and reports to be exposed to [place] in [place]. CT scan 04/20/2017 which revealed moderate left hydronephrosis and delayed excretion down to a large 4.4 cm mass in or around the left proximal ureter. There was also hazy soft tissue in the fat surrounding the celiac axis and superior mesenteric artery. Thickening of the right diaphragmatic crus. High gastrohepatic ligament lymphadenopathy measures up to 11 mm. Peripancreatic and root of small bowel mesentery adenopathy measures up to 2.6 cm.  He was seen by urologist Dr. Pilar Jarvis and he underwent cystoscopy, left retrograde pyelogram, left ureteroscopy and placement of left ureteral stent placement on 05/13/2017. Finding during the procedure that he has extrinsic compression of the ureter, possibly from lymphoma.   PET scan 05/18/2017  IMPRESSION: 1. Multifocal metastatic disease within the LEFT retroperitoneum, central mesenteries, and RIGHT paraspinal musculature. 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 Recommend percutaneous biopsy of the RIGHT paraspinal musculature at L2. 2. 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  # Pathology:  Biopsy of paraspinal musculature  at L2. Pathology results reviewed large B cell lymphoma. It is CD20 positive, CD10 positive. Flow cytometry shows a small clonal population of large B cells positive for CD10 and absent cop and lambda light chains. The abnormal cells represent 2% of the total cells.The morphology, initial IHC panel, and flow cytometry supports classification as B-cell lymphoma, CD10 positive, with large cell features.diffuse large B-cell lymphoma, germinal center B-cell type.  Discussed with pathologist Dr.Onley, Ki67 50%, MUM1 negative, MYC negative, Cycline D1 negative, CD30 negative, FISH is positive for BCL2 gene rearrangement. Negative for MYC and BCL6. 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   # Bone marrow Biopsy: negative for lymphoma involvement.  # Treatment:  06/21/2017 Cycle 1 RCHOP [name]. Dexamethasone 56m on Day 1,  Prednisone 80 mg daily Day 2-5. 07/12/2017 Cycle 2 RCHOP /onpro planned.Dexamethasone 266mon Day 1,  Need to take Prednisone 80 mg daily Day 2-5  INTERVAL HISTORY [name] for evaluation of cycle 2 RCHOP chemotherapy.  Patient reports feeling well today. He had an episode of chest pressure 2 weeks ago which resolved spontaneously. Also had lower extremity swelling which also resolved. He denies any prior chest pain or pressure episode. Denies any history of cardiovascular disease except cholesterol high at one point. He takes Aspirin 8137maily.  Today he reports having no chest pain, sob, lower extremity swelling.  He has chronic left side back pain intermittently usually triggered by change of position. Filling of one tooth came out 2 weeks ago. Denies any tooth pain. Denies fever or chills.   Review of Systems  Review of Systems  Constitutional: Negative for chills and fever.  HENT: Negative for hearing loss and tinnitus.  Eyes: Negative for blurred vision and redness.  Respiratory: Negative for cough, hemoptysis,  sputum production and wheezing.   Cardiovascular: Negative for chest pain, palpitations, orthopnea, leg swelling and PND.  Gastrointestinal: Negative for heartburn and nausea.  Genitourinary: Negative for dysuria and urgency.  Musculoskeletal: Negative for myalgias and neck pain.  Skin: Negative for itching and rash.  Neurological: Negative for dizziness and headaches.  Endo/Heme/Allergies: Negative for environmental allergies. Does not bruise/bleed easily.  Psychiatric/Behavioral: Negative for depression and suicidal ideas.   MEDICAL HISTORY:  Past Medical History:  Diagnosis Date  . Gout   . Hard of hearing   . Hypertension   . Urinary incontinence     SURGICAL HISTORY: Past Surgical History:  Procedure Laterality Date  . CYSTOSCOPY WITH BIOPSY Left 05/13/2017   Procedure: CYSTOSCOPY WITH URETERAL BIOPSY;  Surgeon: Nickie Retort, MD;  Location: [facility];  Service: Urology;  Laterality: Left;  . CYSTOSCOPY WITH STENT PLACEMENT Left 05/13/2017   Procedure: CYSTOSCOPY WITH STENT PLACEMENT;  Surgeon: Nickie Retort, MD;  Location: [facility];  Service: Urology;  Laterality: Left;  . PORTA CATH INSERTION N/A 06/14/2017   Procedure: PORTA CATH INSERTION;  Surgeon: Algernon Huxley, MD;  Location: [place] [facility];  Service: Cardiovascular;  Laterality: N/A;  . 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 right knne surgery     needle went through knee  . TONSILLECTOMY    . URETEROSCOPY Left 05/13/2017   Procedure: URETEROSCOPY;  Surgeon: Nickie Retort, MD;  Location: [facility];  Service: Urology;  Laterality: Left;    SOCIAL HISTORY: Social History   Socioeconomic History  . Marital status: Married    Spouse name: Not on file  . Number of children: Not on file  . Years of education: Not on file  . Highest education level: Not on file  Social Needs  . Financial resource strain: Not on file  . Food insecurity - worry: Not on file  . Food insecurity - inability: Not on file    . Transportation needs - medical: Not on file  . Transportation needs - non-medical: Not on file  Occupational History  . Not on file  Tobacco Use  . Smoking status: Current Every Day Smoker    Packs/day: 0.50    Years: 50.00    Pack years: 25.00    Types: Cigarettes  . Smokeless tobacco: Never Used  Substance and Sexual Activity  . Alcohol use: Yes    Alcohol/week: 1.8 oz    Types: 3 Shots of liquor per week  . Drug use: No  . Sexual activity: Not on file  Other Topics Concern  . Not on file  Social History Narrative  . Not on file    FAMILY HISTORY: Family History  Problem Relation Age of Onset  . Pancreatic cancer Mother   . Aneurysm Mother   . Lung cancer Father   . Lymphoma Sister   . Prostate cancer Neg Hx   . Kidney cancer Neg Hx   . Bladder Cancer Neg Hx     ALLERGIES:  has No Known Allergies.  MEDICATIONS:  Current Outpatient Medications  Medication Sig Dispense Refill  . amLODipine (NORVASC) 10 MG tablet Take 10 mg by mouth every evening.     Marland Kitchen aspirin EC 81 MG tablet Take 81 mg by mouth daily.    . chlorhexidine (PERIDEX) 0.12 % solution Use as directed 15 mLs 2 (two) times daily in the mouth or throat.  1893 mL 0  . hydrALAZINE (APRESOLINE) 25 MG tablet Take 25 mg by mouth every evening.     . lidocaine-prilocaine (EMLA) cream Apply 1 application as needed topically. 30 g 1  . losartan-hydrochlorothiazide (HYZAAR) 100-25 MG tablet Take 1 tablet by mouth daily.    . ondansetron (ZOFRAN) 8 MG tablet Take 1 tablet (8 mg total) every 8 (eight) hours as needed by mouth for nausea or vomiting. 30 tablet 1  . pravastatin (PRAVACHOL) 80 MG tablet Take 80 mg by mouth 2 (two) times a week.     . predniSONE (DELTASONE) 20 MG tablet Take 4 tablets (80 mg total) daily with breakfast by mouth. Day 2-5 of each cycle 96 tablet 0  . prochlorperazine (COMPAZINE) 10 MG tablet Take 1 tablet (10 mg total) every 6 (six) hours as needed by mouth for nausea or vomiting. 30  tablet 1  . zolpidem (AMBIEN) 10 MG tablet Take 10 mg by mouth at bedtime.      No current facility-administered medications for this visit.       Marland Kitchen  PHYSICAL EXAMINATION: ECOG PERFORMANCE STATUS: 0 - Asymptomatic Vitals:   07/12/17 0916  BP: 134/74  Pulse: (!) 53  Temp: (!) 96.4 F (35.8 C)   Filed Weights   07/12/17 0916  Weight: 219 lb 9 oz (99.6 kg)    Physical Exam  Constitutional: He is oriented to person, place, and time and well-developed, well-nourished, and in no distress. No distress.  HENT:  Head: Normocephalic and atraumatic.  Eyes: Conjunctivae and EOM are normal. Pupils are equal, round, and reactive to light. Left eye exhibits no discharge.  Neck: Normal range of motion. Neck supple.  Cardiovascular: Normal rate and regular rhythm.  No murmur heard. Pulmonary/Chest: Effort normal and breath sounds normal. No respiratory distress.  Abdominal: Soft. Bowel sounds are normal. He exhibits no distension.  Musculoskeletal: Normal range of motion. He exhibits no tenderness.  Lymphadenopathy:    He has no cervical adenopathy.  Neurological: He is alert and oriented to person, place, and time. Gait normal.  Skin: Skin is warm and dry. No erythema.  Psychiatric: Affect normal.    LABORATORY DATA:  I have reviewed the data as listed Lab Results  Component Value Date   WBC 7.0 07/12/2017   HGB 12.1 (L) 07/12/2017   HCT 34.0 (L) 07/12/2017   MCV 86.2 07/12/2017   PLT 301 07/12/2017   Recent Labs    06/21/17 0806 06/28/17 0840 07/12/17 0900  NA 139 139 138  K 3.3* 4.2 3.6  CL 104 100* 106  CO2 29 32 26  GLUCOSE 126* 102* 111*  BUN 17 23* 18  CREATININE 0.96 1.00 0.96  CALCIUM 9.2 9.1 9.1  GFRNONAA >60 >60 >60  GFRAA >60 >60 >60  PROT 7.3 6.6 7.2  ALBUMIN 3.9 3.7 3.7  AST 17 15 13*  ALT 12* 17 12*  ALKPHOS 95 98 96  BILITOT 0.9 0.8 0.6    RADIOGRAPHIC STUDIES:O I have personally reviewed the radiological images as listed and agreed with the  findings in the report. 05/26/2017 PET scan IMPRESSION: 1. Multifocal metastatic disease within the LEFT retroperitoneum,central mesenteries, and RIGHT paraspinal musculature. 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 Recommend percutaneous biopsy of theRIGHT paraspinal musculature at L2. 2. 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  Hepatitis B antigen negative. 2-D echo.showed LVEF 60%  ASSESSMENT & PLAN:  This is a 71 year old male with stage IV diffuse large B-cell  lymphoma, on first-line chemotherapy with R CHOP with curative intent, present for follow-up.  1. Diffuse large B-cell lymphoma of extranodal site excluding spleen and other solid organs ([place])   2. Encounter for antineoplastic chemotherapy   3. Chest tightness or pressure   - Discussed with patient that chemotherapy may cause a risk of cardiovascular events, including MI or CHF. Baseline Echo was checked.  Currently he does not have symptoms. Offered patient to hold chemotherapy and get cardiology evaluation. Patient understands the risk and wants to take the risk and proceed with today's treatment. I will refer him to see cardiology for further evaluation. Patient voices understanding. I advise patient if he has another episode of chest pressure, he should seek medical advice immediately.   - proceed with cycle 2 RCHOP/Onpro. Today  - Interim PET Scan after 2 cycles of treatment.  --Continue peridex swish and swallow. Advise him to see dentist and have filled done. Cbc before dental procedure. - The patient knows to call the clinic with any problems questions or concerns.  Return of visit: lab/MD/RCHOP/Onpro in 3 weeks   Earlie Server, MD, PhD Hematology Oncology [facility] Pager- [phone]

## 2017-07-12 ENCOUNTER — Inpatient Hospital Stay: Payer: Medicare HMO

## 2017-07-12 ENCOUNTER — Inpatient Hospital Stay: Payer: Medicare HMO | Attending: Oncology | Admitting: Oncology

## 2017-07-12 ENCOUNTER — Other Ambulatory Visit: Payer: Self-pay

## 2017-07-12 ENCOUNTER — Encounter: Payer: Self-pay | Admitting: Oncology

## 2017-07-12 VITALS — BP 134/74 | HR 53 | Temp 96.4°F | Wt 219.6 lb

## 2017-07-12 DIAGNOSIS — M109 Gout, unspecified: Secondary | ICD-10-CM | POA: Diagnosis not present

## 2017-07-12 DIAGNOSIS — I10 Essential (primary) hypertension: Secondary | ICD-10-CM | POA: Diagnosis not present

## 2017-07-12 DIAGNOSIS — C8339 Diffuse large B-cell lymphoma, extranodal and solid organ sites: Secondary | ICD-10-CM

## 2017-07-12 DIAGNOSIS — F17210 Nicotine dependence, cigarettes, uncomplicated: Secondary | ICD-10-CM | POA: Insufficient documentation

## 2017-07-12 DIAGNOSIS — R0789 Other chest pain: Secondary | ICD-10-CM

## 2017-07-12 DIAGNOSIS — Z5111 Encounter for antineoplastic chemotherapy: Secondary | ICD-10-CM | POA: Diagnosis not present

## 2017-07-12 DIAGNOSIS — Z7689 Persons encountering health services in other specified circumstances: Secondary | ICD-10-CM | POA: Insufficient documentation

## 2017-07-12 DIAGNOSIS — Z79899 Other long term (current) drug therapy: Secondary | ICD-10-CM | POA: Diagnosis not present

## 2017-07-12 DIAGNOSIS — E7800 Pure hypercholesterolemia, unspecified: Secondary | ICD-10-CM | POA: Insufficient documentation

## 2017-07-12 DIAGNOSIS — C8330 Diffuse large B-cell lymphoma, unspecified site: Secondary | ICD-10-CM | POA: Insufficient documentation

## 2017-07-12 DIAGNOSIS — Z7982 Long term (current) use of aspirin: Secondary | ICD-10-CM | POA: Diagnosis not present

## 2017-07-12 DIAGNOSIS — Z7951 Long term (current) use of inhaled steroids: Secondary | ICD-10-CM | POA: Diagnosis not present

## 2017-07-12 DIAGNOSIS — C7951 Secondary malignant neoplasm of bone: Secondary | ICD-10-CM | POA: Insufficient documentation

## 2017-07-12 LAB — COMPREHENSIVE METABOLIC PANEL
ALBUMIN: 3.7 g/dL (ref 3.5–5.0)
ALT: 12 U/L — ABNORMAL LOW (ref 17–63)
ANION GAP: 6 (ref 5–15)
AST: 13 U/L — ABNORMAL LOW (ref 15–41)
Alkaline Phosphatase: 96 U/L (ref 38–126)
BILIRUBIN TOTAL: 0.6 mg/dL (ref 0.3–1.2)
BUN: 18 mg/dL (ref 6–20)
CHLORIDE: 106 mmol/L (ref 101–111)
CO2: 26 mmol/L (ref 22–32)
Calcium: 9.1 mg/dL (ref 8.9–10.3)
Creatinine, Ser: 0.96 mg/dL (ref 0.61–1.24)
GFR calc Af Amer: >60 mL/min (ref >60)
GFR calc non Af Amer: >60 mL/min (ref >60)
GLUCOSE: 111 mg/dL — AB (ref 65–99)
POTASSIUM: 3.6 mmol/L (ref 3.5–5.1)
SODIUM: 138 mmol/L (ref 135–145)
TOTAL PROTEIN: 7.2 g/dL (ref 6.5–8.1)

## 2017-07-12 LAB — CBC WITH DIFFERENTIAL/PLATELET
BASOS ABS: 0.1 10*3/uL (ref 0–0.1)
Basophils Relative: 1 %
Eosinophils Absolute: 0 10*3/uL (ref 0–0.7)
Eosinophils Relative: 0 %
HEMATOCRIT: 34 % — AB (ref 40.0–52.0)
Hemoglobin: 12.1 g/dL — ABNORMAL LOW (ref 13.0–18.0)
LYMPHS PCT: 6 %
Lymphs Abs: 0.4 10*3/uL — ABNORMAL LOW (ref 1.0–3.6)
MCH: 30.6 pg (ref 26.0–34.0)
MCHC: 35.5 g/dL (ref 32.0–36.0)
MCV: 86.2 fL (ref 80.0–100.0)
Monocytes Absolute: 0.8 10*3/uL (ref 0.2–1.0)
Monocytes Relative: 12 %
NEUTROS ABS: 5.7 10*3/uL (ref 1.4–6.5)
Neutrophils Relative %: 81 %
Platelets: 301 10*3/uL (ref 150–440)
RBC: 3.94 MIL/uL — AB (ref 4.40–5.90)
RDW: 13.5 % (ref 11.5–14.5)
WBC: 7 10*3/uL (ref 3.8–10.6)

## 2017-07-12 LAB — URIC ACID: Uric Acid, Serum: 5.5 mg/dL (ref 4.4–7.6)

## 2017-07-12 LAB — LACTATE DEHYDROGENASE: LDH: 131 U/L (ref 98–192)

## 2017-07-12 MED ORDER — HEPARIN SOD (PORK) LOCK FLUSH 100 UNIT/ML IV SOLN
500.0000 [IU] | Freq: Once | INTRAVENOUS | Status: AC
  Administered 2017-07-12: 500 [IU] via INTRAVENOUS
  Filled 2017-07-12: qty 5

## 2017-07-12 MED ORDER — DEXAMETHASONE SODIUM PHOSPHATE 100 MG/10ML IJ SOLN
20.0000 mg | Freq: Once | INTRAMUSCULAR | Status: AC
  Administered 2017-07-12: 20 mg via INTRAVENOUS
  Filled 2017-07-12: qty 2

## 2017-07-12 MED ORDER — DOXORUBICIN HCL CHEMO IV INJECTION 2 MG/ML
50.0000 mg/m2 | Freq: Once | INTRAVENOUS | Status: AC
  Administered 2017-07-12: 114 mg via INTRAVENOUS
  Filled 2017-07-12: qty 57

## 2017-07-12 MED ORDER — SODIUM CHLORIDE 0.9% FLUSH
10.0000 mL | Freq: Once | INTRAVENOUS | Status: AC
  Administered 2017-07-12: 10 mL via INTRAVENOUS
  Filled 2017-07-12: qty 10

## 2017-07-12 MED ORDER — SODIUM CHLORIDE 0.9 % IV SOLN
Freq: Once | INTRAVENOUS | Status: AC
  Administered 2017-07-12: 10:00:00 via INTRAVENOUS
  Filled 2017-07-12: qty 1000

## 2017-07-12 MED ORDER — VINCRISTINE SULFATE CHEMO INJECTION 1 MG/ML
2.0000 mg | Freq: Once | INTRAVENOUS | Status: AC
  Administered 2017-07-12: 2 mg via INTRAVENOUS
  Filled 2017-07-12: qty 2

## 2017-07-12 MED ORDER — SODIUM CHLORIDE 0.9% FLUSH
10.0000 mL | INTRAVENOUS | Status: DC | PRN
  Filled 2017-07-12: qty 10

## 2017-07-12 MED ORDER — PEGFILGRASTIM 6 MG/0.6ML ~~LOC~~ PSKT
6.0000 mg | PREFILLED_SYRINGE | Freq: Once | SUBCUTANEOUS | Status: AC
  Administered 2017-07-12: 6 mg via SUBCUTANEOUS
  Filled 2017-07-12: qty 0.6

## 2017-07-12 MED ORDER — SODIUM CHLORIDE 0.9 % IV SOLN
375.0000 mg/m2 | Freq: Once | INTRAVENOUS | Status: AC
  Administered 2017-07-12: 800 mg via INTRAVENOUS
  Filled 2017-07-12: qty 30

## 2017-07-12 MED ORDER — DIPHENHYDRAMINE HCL 25 MG PO CAPS
50.0000 mg | ORAL_CAPSULE | Freq: Once | ORAL | Status: AC
  Administered 2017-07-12: 50 mg via ORAL
  Filled 2017-07-12: qty 2

## 2017-07-12 MED ORDER — ACETAMINOPHEN 325 MG PO TABS
650.0000 mg | ORAL_TABLET | Freq: Once | ORAL | Status: AC
  Administered 2017-07-12: 650 mg via ORAL
  Filled 2017-07-12: qty 2

## 2017-07-12 MED ORDER — HEPARIN SOD (PORK) LOCK FLUSH 100 UNIT/ML IV SOLN: 500.0000 [IU] | Freq: Once | INTRAVENOUS | Status: DC | PRN

## 2017-07-12 MED ORDER — PALONOSETRON HCL INJECTION 0.25 MG/5ML
0.2500 mg | Freq: Once | INTRAVENOUS | Status: AC
  Administered 2017-07-12: 0.25 mg via INTRAVENOUS
  Filled 2017-07-12: qty 5

## 2017-07-12 MED ORDER — SODIUM CHLORIDE 0.9 % IV SOLN: 375.0000 mg/m2 | Freq: Once | INTRAVENOUS | Status: DC

## 2017-07-12 MED ORDER — CYCLOPHOSPHAMIDE CHEMO INJECTION 1 GM
750.0000 mg/m2 | Freq: Once | INTRAMUSCULAR | Status: AC
  Administered 2017-07-12: 1700 mg via INTRAVENOUS
  Filled 2017-07-12: qty 50

## 2017-07-12 NOTE — Progress Notes (Signed)
Patient here today for follow up.   

## 2017-07-18 NOTE — Telephone Encounter (Signed)
Error

## 2017-07-25 ENCOUNTER — Ambulatory Visit (INDEPENDENT_AMBULATORY_CARE_PROVIDER_SITE_OTHER): Payer: Medicare HMO | Admitting: Urology

## 2017-07-25 ENCOUNTER — Other Ambulatory Visit: Payer: Self-pay | Admitting: Radiology

## 2017-07-25 ENCOUNTER — Encounter: Payer: Self-pay | Admitting: Urology

## 2017-07-25 VITALS — BP 165/82 | HR 58 | Ht 73.0 in | Wt 220.0 lb

## 2017-07-25 DIAGNOSIS — N1330 Unspecified hydronephrosis: Secondary | ICD-10-CM

## 2017-07-25 NOTE — Progress Notes (Signed)
07/25/2017 2:58 PM   John Gallegos [birth year] [id number]  Referring provider: Tracie Harrier, MD [address] [place], [place] [address]  Chief Complaint  Patient presents with  . Follow-up    discuss surgery    HPI: 71 year old male presents for follow-up.  He was seen in September 2018 for microhematuria and found to have moderate left hydronephrosis secondary to a large 4 cm mass causing extrinsic compression on the left proximal ureter.  This was found to be diffuse large B-cell lymphoma. He had a left ureteral stent placed on 05/13/2017.  He is currently being treated and oncology.  He states he is scheduled for a PET scan later this month.  He presents today to discuss stent management.  He achieved 2 denies flank or abdominal pain but does note urinary frequency and urgency which has been bothersome.   PMH: Past Medical History:  Diagnosis Date  . Gout   . Hard of hearing   . Hypertension   . Urinary incontinence     Surgical History: Past Surgical History:  Procedure Laterality Date  . CYSTOSCOPY WITH BIOPSY Left 05/13/2017   Procedure: CYSTOSCOPY WITH URETERAL BIOPSY;  Surgeon: Nickie Retort, MD;  Location: [facility];  Service: Urology;  Laterality: Left;  . CYSTOSCOPY WITH STENT PLACEMENT Left 05/13/2017   Procedure: CYSTOSCOPY WITH STENT PLACEMENT;  Surgeon: Nickie Retort, MD;  Location: [facility];  Service: Urology;  Laterality: Left;  . PORTA CATH INSERTION N/A 06/14/2017   Procedure: PORTA CATH INSERTION;  Surgeon: Algernon Huxley, MD;  Location: [place] [facility];  Service: Cardiovascular;  Laterality: N/A;  . pyleostenosis     52 weeks old  . right knne surgery     needle went through knee  . TONSILLECTOMY    . URETEROSCOPY Left 05/13/2017   Procedure: URETEROSCOPY;  Surgeon: Nickie Retort, MD;  Location: [facility];  Service: Urology;  Laterality: Left;    Home Medications:  Allergies as of 07/25/2017     No Known Allergies     Medication List        Accurate as of 07/25/17  2:58 PM. Always use your most recent med list.          amLODipine 10 MG tablet Commonly known as:  NORVASC Take 10 mg by mouth every evening.   aspirin EC 81 MG tablet Take 81 mg by mouth daily.   losartan-hydrochlorothiazide 100-25 MG tablet Commonly known as:  HYZAAR Take 1 tablet by mouth daily.   pravastatin 80 MG tablet Commonly known as:  PRAVACHOL Take 80 mg by mouth 2 (two) times a week.   predniSONE 20 MG tablet Commonly known as:  DELTASONE Take 4 tablets (80 mg total) daily with breakfast by mouth. Day 2-5 of each cycle   zolpidem 10 MG tablet Commonly known as:  AMBIEN Take 10 mg by mouth at bedtime.       Allergies: No Known Allergies  Family History: Family History  Problem Relation Age of Onset  . Pancreatic cancer Mother   . Aneurysm Mother   . Lung cancer Father   . Lymphoma Sister   . Prostate cancer Neg Hx   . Kidney cancer Neg Hx   . Bladder Cancer Neg Hx     Social History:  reports that he has been smoking cigarettes.  He has a 25.00 pack-year smoking history. he has never used smokeless tobacco. He reports that he drinks about 1.8 oz of  alcohol per week. He reports that he does not use drugs.  ROS: UROLOGY Frequent Urination?: No Hard to postpone urination?: No Burning/pain with urination?: No Get up at night to urinate?: No Leakage of urine?: No Urine stream starts and stops?: No Trouble starting stream?: No Do you have to strain to urinate?: No Blood in urine?: No Urinary tract infection?: No Sexually transmitted disease?: No Injury to kidneys or bladder?: No Painful intercourse?: No Weak stream?: No Erection problems?: No Penile pain?: No  Gastrointestinal Nausea?: No Vomiting?: No Indigestion/heartburn?: No Diarrhea?: No Constipation?: No  Constitutional Fever: No Night sweats?: No Weight loss?: No Fatigue?: No  Skin Skin  rash/lesions?: No Itching?: No  Eyes Blurred vision?: No Double vision?: No  Ears/Nose/Throat Sore throat?: No Sinus problems?: No  Hematologic/Lymphatic Swollen glands?: No Easy bruising?: No  Cardiovascular Leg swelling?: No Chest pain?: No  Respiratory Cough?: No Shortness of breath?: No  Endocrine Excessive thirst?: No  Musculoskeletal Back pain?: No Joint pain?: No  Neurological Headaches?: No Dizziness?: No  Psychologic Depression?: No Anxiety?: No  Physical Exam: BP (!) 165/82   Pulse (!) 58   Ht 6\' 1"  (1.854 m)   Wt 220 lb (99.8 kg)   BMI 29.03 kg/m   Constitutional:  Alert and oriented, No acute distress. HEENT: John Gallegos AT, moist mucus membranes.  Trachea midline, no masses. Cardiovascular: No clubbing, cyanosis, or edema. CV:RRR Respiratory: Normal respiratory effort, no increased work of breathing.  Lungs clear GI: Abdomen is soft, nontender, nondistended, no abdominal masses GU: No CVA tenderness. Skin: No rashes, bruises or suspicious lesions. Lymph: No cervical or inguinal adenopathy. Neurologic: Grossly intact, no focal deficits, moving all 4 extremities. Psychiatric: Normal mood and affect.  Laboratory Data: Lab Results  Component Value Date   WBC 7.0 07/12/2017   HGB 12.1 (L) 07/12/2017   HCT 34.0 (L) 07/12/2017   MCV 86.2 07/12/2017   PLT 301 07/12/2017    Lab Results  Component Value Date   CREATININE 0.96 07/12/2017    Assessment & Plan:  Left hydronephrosis secondary to extrinsic compression status post left ureteral stent placement for of the left proximal ureter due to lymphoma.  His stent will have been indwelling for 3 months in early January.  Have recommended scheduling cystoscopy with removal of left ureteral stent and left retrograde pyelogram.  If he has persistent left hydronephrosis his stent will be exchanged.  The procedure was discussed including potential risks of bleeding, infection and ureteral injury.  He  indicated all questions were answered and desires to proceed.  He was given samples of Myrbetriq 50 mg daily for stent irritation.   John Gallegos, [place] [address], [place], [place] [address] [phone]

## 2017-07-25 NOTE — H&P (View-Only) (Signed)
07/25/2017 2:58 PM   [place] November 10, 1945 [id number]  Referring provider: Tracie Harrier, MD [address] [facility] [place], [place] [address]  Chief Complaint  Patient presents with  . Follow-up    discuss surgery    HPI: 71 year old male presents for follow-up.  He was seen in September 2018 for microhematuria and found to have moderate left hydronephrosis secondary to a large 4 cm mass causing extrinsic compression on the left proximal ureter.  This was found to be diffuse large B-cell lymphoma. He had a left ureteral stent placed on 05/13/2017.  He is currently being treated and oncology.  He states he is scheduled for a PET scan later this month.  He presents today to discuss stent management.  He achieved 2 denies flank or abdominal pain but does note urinary frequency and urgency which has been bothersome.   PMH: Past Medical History:  Diagnosis Date  . Gout   . Hard of hearing   . Hypertension   . Urinary incontinence     Surgical History: Past Surgical History:  Procedure Laterality Date  . CYSTOSCOPY WITH BIOPSY Left 05/13/2017   Procedure: CYSTOSCOPY WITH URETERAL BIOPSY;  Surgeon: Nickie Retort, MD;  Location: [facility];  Service: Urology;  Laterality: Left;  . CYSTOSCOPY WITH STENT PLACEMENT Left 05/13/2017   Procedure: CYSTOSCOPY WITH STENT PLACEMENT;  Surgeon: Nickie Retort, MD;  Location: [facility];  Service: Urology;  Laterality: Left;  . PORTA CATH INSERTION N/A 06/14/2017   Procedure: PORTA CATH INSERTION;  Surgeon: Algernon Huxley, MD;  Location: [place] [facility];  Service: Cardiovascular;  Laterality: N/A;  . pyleostenosis     6 weeks old  . right knne surgery     needle went through knee  . TONSILLECTOMY    . URETEROSCOPY Left 05/13/2017   Procedure: URETEROSCOPY;  Surgeon: Nickie Retort, MD;  Location: [facility];  Service: Urology;  Laterality: Left;    Home Medications:  Allergies as of 07/25/2017     No Known Allergies     Medication List        Accurate as of 07/25/17  2:58 PM. Always use your most recent med list.          amLODipine 10 MG tablet Commonly known as:  NORVASC Take 10 mg by mouth every evening.   aspirin EC 81 MG tablet Take 81 mg by mouth daily.   losartan-hydrochlorothiazide 100-25 MG tablet Commonly known as:  HYZAAR Take 1 tablet by mouth daily.   pravastatin 80 MG tablet Commonly known as:  PRAVACHOL Take 80 mg by mouth 2 (two) times a week.   predniSONE 20 MG tablet Commonly known as:  DELTASONE Take 4 tablets (80 mg total) daily with breakfast by mouth. Day 2-5 of each cycle   zolpidem 10 MG tablet Commonly known as:  AMBIEN Take 10 mg by mouth at bedtime.       Allergies: No Known Allergies  Family History: Family History  Problem Relation Age of Onset  . Pancreatic cancer Mother   . Aneurysm Mother   . Lung cancer Father   . Lymphoma Sister   . Prostate cancer Neg Hx   . Kidney cancer Neg Hx   . Bladder Cancer Neg Hx     Social History:  reports that he has been smoking cigarettes.  He has a 25.00 pack-year smoking history. he has never used smokeless tobacco. He reports that he drinks about 1.8 oz of  alcohol per week. He reports that he does not use drugs.  ROS: UROLOGY Frequent Urination?: No Hard to postpone urination?: No Burning/pain with urination?: No Get up at night to urinate?: No Leakage of urine?: No Urine stream starts and stops?: No Trouble starting stream?: No Do you have to strain to urinate?: No Blood in urine?: No Urinary tract infection?: No Sexually transmitted disease?: No Injury to kidneys or bladder?: No Painful intercourse?: No Weak stream?: No Erection problems?: No Penile pain?: No  Gastrointestinal Nausea?: No Vomiting?: No Indigestion/heartburn?: No Diarrhea?: No Constipation?: No  Constitutional Fever: No Night sweats?: No Weight loss?: No Fatigue?: No  Skin Skin  rash/lesions?: No Itching?: No  Eyes Blurred vision?: No Double vision?: No  Ears/Nose/Throat Sore throat?: No Sinus problems?: No  Hematologic/Lymphatic Swollen glands?: No Easy bruising?: No  Cardiovascular Leg swelling?: No Chest pain?: No  Respiratory Cough?: No Shortness of breath?: No  Endocrine Excessive thirst?: No  Musculoskeletal Back pain?: No Joint pain?: No  Neurological Headaches?: No Dizziness?: No  Psychologic Depression?: No Anxiety?: No  Physical Exam: BP (!) 165/82   Pulse (!) 58   Ht 6\' 1"  (1.854 m)   Wt 220 lb (99.8 kg)   BMI 29.03 kg/m   Constitutional:  Alert and oriented, No acute distress. HEENT: [place] AT, moist mucus membranes.  Trachea midline, no masses. Cardiovascular: No clubbing, cyanosis, or edema. CV:RRR Respiratory: Normal respiratory effort, no increased work of breathing.  Lungs clear GI: Abdomen is soft, nontender, nondistended, no abdominal masses GU: No CVA tenderness. Skin: No rashes, bruises or suspicious lesions. Lymph: No cervical or inguinal adenopathy. Neurologic: Grossly intact, no focal deficits, moving all 4 extremities. Psychiatric: Normal mood and affect.  Laboratory Data: Lab Results  Component Value Date   WBC 7.0 07/12/2017   HGB 12.1 (L) 07/12/2017   HCT 34.0 (L) 07/12/2017   MCV 86.2 07/12/2017   PLT 301 07/12/2017    Lab Results  Component Value Date   CREATININE 0.96 07/12/2017    Assessment & Plan:  Left hydronephrosis secondary to extrinsic compression status post left ureteral stent placement for of the left proximal ureter due to lymphoma.  His stent will have been indwelling for 3 months in early January.  Have recommended scheduling cystoscopy with removal of left ureteral stent and left retrograde pyelogram.  If he has persistent left hydronephrosis his stent will be exchanged.  The procedure was discussed including potential risks of bleeding, infection and ureteral injury.  He  indicated all questions were answered and desires to proceed.  He was given samples of Myrbetriq 50 mg daily for stent irritation.   Abbie Sons, [place] [address] [place], [place] [address] [phone]

## 2017-07-26 ENCOUNTER — Other Ambulatory Visit: Payer: Self-pay | Admitting: Radiology

## 2017-07-27 ENCOUNTER — Telehealth: Payer: Self-pay

## 2017-07-28 ENCOUNTER — Encounter
Admission: RE | Admit: 2017-07-28 | Discharge: 2017-07-28 | Disposition: A | Discharge status: Home / Self Care | Payer: Medicare HMO | Source: Ambulatory Visit | Attending: Oncology | Admitting: Oncology

## 2017-07-28 DIAGNOSIS — C8339 Diffuse large B-cell lymphoma, extranodal and solid organ sites: Secondary | ICD-10-CM

## 2017-07-28 DIAGNOSIS — Z5111 Encounter for antineoplastic chemotherapy: Secondary | ICD-10-CM

## 2017-07-28 DIAGNOSIS — C8330 Diffuse large B-cell lymphoma, unspecified site: Secondary | ICD-10-CM | POA: Diagnosis not present

## 2017-07-28 LAB — GLUCOSE, CAPILLARY: Glucose-Capillary: 93 mg/dL (ref 65–99)

## 2017-07-28 MED ORDER — FLUDEOXYGLUCOSE F - 18 (FDG) INJECTION
12.0000 | Freq: Once | INTRAVENOUS | Status: AC | PRN
  Administered 2017-07-28: 12.38 via INTRAVENOUS

## 2017-07-28 NOTE — Telephone Encounter (Signed)
Patient has upcoming appt with Dr. Rockey Situ   Signed ROI for :  [facility]  PCP office Dr. Caryl Asp   Faxed Request for Records

## 2017-08-02 NOTE — Progress Notes (Signed)
Hematology/Oncology follow-up note [facility] Telephone:([phone] [phone]   Patient Care Team: Tracie Harrier, MD as PCP - General (Internal Medicine)  REFERRING PROVIDER: Tracie Harrier, MD  REASON FOR VISIT Follow up for treatment of diffuse large B cell lymphoma.   HISTORY OF PRESENTING ILLNESS:  John Gallegos is a  71 y.o.  male with PMH listed below presented for follow-up for treatment of diffuse large B cell lymphoma.  Pertinent history of oncology workup includes: Patient was found to have microscopic hematuria on 02/23/2017 with 4-10 RBC's/hpf.    Urine culture was negative.having symptoms of nocturia He is smoker. 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 He was in the [employer] and reports to be exposed to [place] in [place]. CT scan 04/20/2017 which revealed moderate left hydronephrosis and delayed excretion down to a large 4.4 cm mass in or around the left proximal ureter. There was also hazy soft tissue in the fat surrounding the celiac axis and superior mesenteric artery. Thickening of the right diaphragmatic crus. High gastrohepatic ligament lymphadenopathy measures up to 11 mm. Peripancreatic and root of small bowel mesentery adenopathy measures up to 2.6 cm.  He was seen by urologist Dr. Pilar Jarvis and he underwent cystoscopy, left retrograde pyelogram, left ureteroscopy and placement of left ureteral stent placement on 05/13/2017. Finding during the procedure that he has extrinsic compression of the ureter, possibly from lymphoma.   PET scan 05/18/2017  IMPRESSION: 1. Multifocal metastatic disease within the LEFT retroperitoneum, central mesenteries, and RIGHT paraspinal musculature. 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 Recommend percutaneous biopsy of the RIGHT paraspinal musculature at L2. 2. 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  # Pathology:  Biopsy of paraspinal musculature  at L2. Pathology results reviewed large B cell lymphoma. It is CD20 positive, CD10 positive. Flow cytometry shows a small clonal population of large B cells positive for CD10 and absent cop and lambda light chains. The abnormal cells represent 2% of the total cells.The morphology, initial IHC panel, and flow cytometry supports classification as B-cell lymphoma, CD10 positive, with large cell features.diffuse large B-cell lymphoma, germinal center B-cell type.  Discussed with pathologist Dr.Onley, Ki67 50%, MUM1 negative, MYC negative, Cycline D1 negative, CD30 negative, FISH is positive for BCL2 gene rearrangement. Negative for MYC and BCL6. 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   # Bone marrow Biopsy: negative for lymphoma involvement.  # Treatment:  06/21/2017 Cycle 1 RCHOP [name]. Dexamethasone 93m on Day 1,  Prednisone 80 mg daily Day 2-5. 07/12/2017 Cycle 2 RCHOP /onpro planned.Dexamethasone 225mon Day 1,  Need to take Prednisone 80 mg daily Day 2-5  INTERVAL HISTORY [name] for evaluation of cycle 3 RCHOP chemotherapy. During the interval he has had a PET scan done. Patient reports feeling well today.  Patient reports feeling well. Denies any fever or chills, nausea vomiting. He has chronic back pain triggered by change of position. Denies any chest Pressure.  Review of Systems  Review of Systems  Constitutional: Negative for chills, fever and malaise/fatigue.  HENT: Negative for hearing loss, nosebleeds and tinnitus.   Eyes: Negative for blurred vision, photophobia and redness.  Respiratory: Negative for cough, hemoptysis and wheezing.   Cardiovascular: Negative for chest pain, palpitations, leg swelling and PND.  Gastrointestinal: Negative for constipation, heartburn and nausea.  Genitourinary: Negative for dysuria and urgency.  Musculoskeletal: Negative for myalgias and neck pain.  Chronic back pain  Skin:  Negative for itching and rash.  Neurological: Negative for dizziness, tingling, sensory change and headaches.  Endo/Heme/Allergies: Negative for environmental allergies. Does not bruise/bleed easily.  Psychiatric/Behavioral: Negative for depression and suicidal ideas. The patient does not have insomnia.    MEDICAL HISTORY:  Past Medical History:  Diagnosis Date  . Gout   . Hard of hearing   . Hypertension   . Urinary incontinence     SURGICAL HISTORY: Past Surgical History:  Procedure Laterality Date  . CYSTOSCOPY WITH BIOPSY Left 05/13/2017   Procedure: CYSTOSCOPY WITH URETERAL BIOPSY;  Surgeon: Nickie Retort, MD;  Location: [facility];  Service: Urology;  Laterality: Left;  . CYSTOSCOPY WITH STENT PLACEMENT Left 05/13/2017   Procedure: CYSTOSCOPY WITH STENT PLACEMENT;  Surgeon: Nickie Retort, MD;  Location: [facility];  Service: Urology;  Laterality: Left;  . PORTA CATH INSERTION N/A 06/14/2017   Procedure: PORTA CATH INSERTION;  Surgeon: Algernon Huxley, MD;  Location: [facility];  Service: Cardiovascular;  Laterality: N/A;  . 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 right knne surgery     needle went through knee  . TONSILLECTOMY    . URETEROSCOPY Left 05/13/2017   Procedure: URETEROSCOPY;  Surgeon: Nickie Retort, MD;  Location: [facility];  Service: Urology;  Laterality: Left;    SOCIAL HISTORY: Social History   Socioeconomic History  . Marital status: Married    Spouse name: Not on file  . Number of children: Not on file  . Years of education: Not on file  . Highest education level: Not on file  Social Needs  . Financial resource strain: Not on file  . Food insecurity - worry: Not on file  . Food insecurity - inability: Not on file  . Transportation needs - medical: Not on file  . Transportation needs - non-medical: Not on file  Occupational History  . Not on file  Tobacco Use  . Smoking status: Current Every Day Smoker    Packs/day: 0.50    Years:  50.00    Pack years: 25.00    Types: Cigarettes  . Smokeless tobacco: Never Used  Substance and Sexual Activity  . Alcohol use: Yes    Alcohol/week: 1.8 oz    Types: 3 Shots of liquor per week  . Drug use: No  . Sexual activity: Not on file  Other Topics Concern  . Not on file  Social History Narrative  . Not on file    FAMILY HISTORY: Family History  Problem Relation Age of Onset  . Pancreatic cancer Mother   . Aneurysm Mother   . Lung cancer Father   . Lymphoma Sister   . Prostate cancer Neg Hx   . Kidney cancer Neg Hx   . Bladder Cancer Neg Hx     ALLERGIES:  has No Known Allergies.  MEDICATIONS:  Current Outpatient Medications  Medication Sig Dispense Refill  . amLODipine (NORVASC) 10 MG tablet Take 10 mg by mouth every evening.     Marland Kitchen aspirin EC 81 MG tablet Take 81 mg by mouth daily.    Marland Kitchen losartan-hydrochlorothiazide (HYZAAR) 100-25 MG tablet Take 1 tablet by mouth daily.    . pravastatin (PRAVACHOL) 80 MG tablet Take 80 mg by mouth 2 (two) times a week.     . predniSONE (DELTASONE) 20 MG tablet Take 4 tablets (80 mg total) daily with breakfast by mouth. Day 2-5 of each cycle 96 tablet 0  .  zolpidem (AMBIEN) 10 MG tablet Take 10 mg by mouth at bedtime.      No current facility-administered medications for this visit.       Marland Kitchen  PHYSICAL EXAMINATION: ECOG PERFORMANCE STATUS: 0 - Asymptomatic Vitals:   08/03/17 0903  BP: (!) 163/86  Pulse: 61  Resp: 18  Temp: (!) 96.2 F (35.7 C)   Filed Weights   08/03/17 0903  Weight: 225 lb 14.4 oz (102.5 kg)    Physical Exam  Constitutional: He is oriented to person, place, and time and well-developed, well-nourished, and in no distress. No distress.  HENT:  Head: Normocephalic and atraumatic.  Mouth/Throat: No oropharyngeal exudate.  Eyes: Conjunctivae and EOM are normal. Pupils are equal, round, and reactive to light. Left eye exhibits no discharge. No scleral icterus.  Neck: Normal range of motion. Neck  supple. No JVD present.  Cardiovascular: Normal rate and regular rhythm. Exam reveals no friction rub.  No murmur heard. Pulmonary/Chest: Effort normal and breath sounds normal. No respiratory distress. He has no wheezes.  Abdominal: Soft. Bowel sounds are normal. He exhibits no distension. There is no tenderness.  Musculoskeletal: Normal range of motion. He exhibits no edema or tenderness.  Lymphadenopathy:    He has no cervical adenopathy.  Neurological: He is alert and oriented to person, place, and time. No cranial nerve deficit. Gait normal.  Skin: Skin is warm and dry. No erythema.  Psychiatric: Affect and judgment normal.    LABORATORY DATA:  I have reviewed the data as listed Lab Results  Component Value Date   WBC 7.0 07/12/2017   HGB 12.1 (L) 07/12/2017   HCT 34.0 (L) 07/12/2017   MCV 86.2 07/12/2017   PLT 301 07/12/2017   Recent Labs    06/21/17 0806 06/28/17 0840 07/12/17 0900  NA 139 139 138  K 3.3* 4.2 3.6  CL 104 100* 106  CO2 29 32 26  GLUCOSE 126* 102* 111*  BUN 17 23* 18  CREATININE 0.96 1.00 0.96  CALCIUM 9.2 9.1 9.1  GFRNONAA >60 >60 >60  GFRAA >60 >60 >60  PROT 7.3 6.6 7.2  ALBUMIN 3.9 3.7 3.7  AST 17 15 13*  ALT 12* 17 12*  ALKPHOS 95 98 96  BILITOT 0.9 0.8 0.6    RADIOGRAPHIC STUDIES:O I have personally reviewed the radiological images as listed and agreed with the findings in the report. 05/26/2017 PET scan IMPRESSION: 1. Multifocal metastatic disease within the LEFT retroperitoneum,central mesenteries, and RIGHT paraspinal musculature. 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 Recommend percutaneous biopsy of theRIGHT paraspinal musculature at L2. 2. 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  Hepatitis B antigen negative. 2-D echo.showed LVEF 60%   PET scan 07/28/2017 IMPRESSION: 1. Marked improvement, with row solution of the prior bony lesions and significant reduction in activity of the central  mesenteric and right gastric adenopathy. Central mesenteric nodal is currently Deauville 3, previously Deauville 4 and Deauville 5. 2. Improvement in the right paraspinal muscular activity, currently Deauville 3, previously Deauville 4.  I also discussed with radiologist that there has been further improvement in the left periureteral density, likely reflecting treatment response. With resolution or near resolution of the associated metabolic activity for example on  fused image 199/603 and the ureter coursing through this region.  It is difficult to predict what will happen if the stent is removed, but my expectation is that the reduced amount of periureteral density and essentially resolved periureteral accentuated metabolic activity will have  significantly decreased extrinsic mass effect on the ureter. If it is decided to remove the stent, short-term subsequent surveillance ultrasound would likely be warranted in order to exclude recurrent hydronephrosis.   ASSESSMENT & PLAN:  This is a 71 year old male with stage IV diffuse large B-cell lymphoma, on first-line chemotherapy with R CHOP with curative intent, present for follow-up.  1. Diffuse large B-cell lymphoma of extranodal site excluding spleen and other solid organs ([place])   2. Encounter for antineoplastic chemotherapy   -PET scan results were discussed in detail with patient and his wife. Patient has very good treatment response. I further discussed patient's PET scan specifically regarding  left periureteral density, and radiology feels there is definitely treatment response however is difficult to predict what will happen if the stent is removed. They recommend a repeat ultrasound after stem being retrieved to prevent recurrent hydronephrosis. Patient will have neurology procedure on 08/19/2016. Upper for patient to repeat CBC prior to his urology procedure. -Proceed with cycle 3 R- CHOP/onpro and finish a total of 6 cycles.   #  Prognostic Model for the risk of CNS lymphoma involvement per NCCN guideline,  Patient is intermediate risk with score of 2, one from being >60, and one from more than extranodal involvement>1 sites (bone, paraspinal muscle involvement). CNS prophylaxis is usually recommended if score 4-6. Therefore will not offer CNS prophylaxis for him.    - The patient knows to call the clinic with any problems questions or concerns.  Return of visit: lab/MD/RCHOP/Onpro in 3 weeks   Earlie Server, MD, PhD Hematology [facility]cology [facility] at [facility] Pager- [phone]

## 2017-08-03 ENCOUNTER — Inpatient Hospital Stay: Payer: Medicare HMO

## 2017-08-03 ENCOUNTER — Encounter: Payer: Self-pay | Admitting: Oncology

## 2017-08-03 ENCOUNTER — Inpatient Hospital Stay: Payer: Medicare HMO | Admitting: Oncology

## 2017-08-03 ENCOUNTER — Other Ambulatory Visit: Payer: Self-pay

## 2017-08-03 VITALS — BP 163/86 | HR 61 | Temp 96.2°F | Resp 18 | Wt 225.9 lb

## 2017-08-03 DIAGNOSIS — I10 Essential (primary) hypertension: Secondary | ICD-10-CM | POA: Diagnosis not present

## 2017-08-03 DIAGNOSIS — C7951 Secondary malignant neoplasm of bone: Secondary | ICD-10-CM

## 2017-08-03 DIAGNOSIS — C8339 Diffuse large B-cell lymphoma, extranodal and solid organ sites: Secondary | ICD-10-CM

## 2017-08-03 DIAGNOSIS — Z7689 Persons encountering health services in other specified circumstances: Secondary | ICD-10-CM | POA: Diagnosis not present

## 2017-08-03 DIAGNOSIS — Z79899 Other long term (current) drug therapy: Secondary | ICD-10-CM

## 2017-08-03 DIAGNOSIS — Z5111 Encounter for antineoplastic chemotherapy: Secondary | ICD-10-CM

## 2017-08-03 DIAGNOSIS — F17210 Nicotine dependence, cigarettes, uncomplicated: Secondary | ICD-10-CM

## 2017-08-03 DIAGNOSIS — E7800 Pure hypercholesterolemia, unspecified: Secondary | ICD-10-CM | POA: Diagnosis not present

## 2017-08-03 DIAGNOSIS — C8330 Diffuse large B-cell lymphoma, unspecified site: Secondary | ICD-10-CM | POA: Diagnosis not present

## 2017-08-03 DIAGNOSIS — M109 Gout, unspecified: Secondary | ICD-10-CM | POA: Diagnosis not present

## 2017-08-03 LAB — CBC WITH DIFFERENTIAL/PLATELET
BASOS ABS: 0.1 10*3/uL (ref 0–0.1)
BASOS PCT: 2 %
Eosinophils Absolute: 0 10*3/uL (ref 0–0.7)
Eosinophils Relative: 0 %
HEMATOCRIT: 29.4 % — AB (ref 40.0–52.0)
HEMOGLOBIN: 10.3 g/dL — AB (ref 13.0–18.0)
Lymphocytes Relative: 5 %
Lymphs Abs: 0.3 10*3/uL — ABNORMAL LOW (ref 1.0–3.6)
MCH: 30.7 pg (ref 26.0–34.0)
MCHC: 35 g/dL (ref 32.0–36.0)
MCV: 87.6 fL (ref 80.0–100.0)
Monocytes Absolute: 0.8 10*3/uL (ref 0.2–1.0)
Monocytes Relative: 11 %
NEUTROS ABS: 5.7 10*3/uL (ref 1.4–6.5)
NEUTROS PCT: 82 %
Platelets: 324 10*3/uL (ref 150–440)
RBC: 3.36 MIL/uL — ABNORMAL LOW (ref 4.40–5.90)
RDW: 14.9 % — AB (ref 11.5–14.5)
WBC: 7 10*3/uL (ref 3.8–10.6)

## 2017-08-03 LAB — COMPREHENSIVE METABOLIC PANEL
ALBUMIN: 3.4 g/dL — AB (ref 3.5–5.0)
ALT: 10 U/L — ABNORMAL LOW (ref 17–63)
AST: 14 U/L — AB (ref 15–41)
Alkaline Phosphatase: 88 U/L (ref 38–126)
Anion gap: 7 (ref 5–15)
BILIRUBIN TOTAL: 0.5 mg/dL (ref 0.3–1.2)
BUN: 19 mg/dL (ref 6–20)
CO2: 25 mmol/L (ref 22–32)
Calcium: 8.8 mg/dL — ABNORMAL LOW (ref 8.9–10.3)
Chloride: 107 mmol/L (ref 101–111)
Creatinine, Ser: 0.99 mg/dL (ref 0.61–1.24)
GFR calc Af Amer: >60 mL/min (ref >60)
GFR calc non Af Amer: >60 mL/min (ref >60)
GLUCOSE: 116 mg/dL — AB (ref 65–99)
POTASSIUM: 3.6 mmol/L (ref 3.5–5.1)
Sodium: 139 mmol/L (ref 135–145)
TOTAL PROTEIN: 6.8 g/dL (ref 6.5–8.1)

## 2017-08-03 MED ORDER — SODIUM CHLORIDE 0.9 % IV SOLN
750.0000 mg/m2 | Freq: Once | INTRAVENOUS | Status: AC
  Administered 2017-08-03: 1700 mg via INTRAVENOUS
  Filled 2017-08-03: qty 85

## 2017-08-03 MED ORDER — SODIUM CHLORIDE 0.9 % IV SOLN
Freq: Once | INTRAVENOUS | Status: AC
  Administered 2017-08-03: 10:00:00 via INTRAVENOUS
  Filled 2017-08-03: qty 1000

## 2017-08-03 MED ORDER — SODIUM CHLORIDE 0.9 % IV SOLN: 375.0000 mg/m2 | Freq: Once | INTRAVENOUS | Status: DC

## 2017-08-03 MED ORDER — PEGFILGRASTIM 6 MG/0.6ML ~~LOC~~ PSKT
6.0000 mg | PREFILLED_SYRINGE | Freq: Once | SUBCUTANEOUS | Status: AC
  Administered 2017-08-03: 6 mg via SUBCUTANEOUS
  Filled 2017-08-03: qty 0.6

## 2017-08-03 MED ORDER — SODIUM CHLORIDE 0.9% FLUSH
10.0000 mL | INTRAVENOUS | Status: DC | PRN
  Filled 2017-08-03: qty 10

## 2017-08-03 MED ORDER — SODIUM CHLORIDE 0.9 % IV SOLN
800.0000 mg | Freq: Once | INTRAVENOUS | Status: AC
  Administered 2017-08-03: 800 mg via INTRAVENOUS
  Filled 2017-08-03: qty 50

## 2017-08-03 MED ORDER — DOXORUBICIN HCL CHEMO IV INJECTION 2 MG/ML
50.0000 mg/m2 | Freq: Once | INTRAVENOUS | Status: AC
  Administered 2017-08-03: 114 mg via INTRAVENOUS
  Filled 2017-08-03: qty 57

## 2017-08-03 MED ORDER — PALONOSETRON HCL INJECTION 0.25 MG/5ML
0.2500 mg | Freq: Once | INTRAVENOUS | Status: AC
  Administered 2017-08-03: 0.25 mg via INTRAVENOUS
  Filled 2017-08-03: qty 5

## 2017-08-03 MED ORDER — DIPHENHYDRAMINE HCL 25 MG PO CAPS
50.0000 mg | ORAL_CAPSULE | Freq: Once | ORAL | Status: AC
  Administered 2017-08-03: 50 mg via ORAL
  Filled 2017-08-03: qty 2

## 2017-08-03 MED ORDER — ACETAMINOPHEN 325 MG PO TABS
650.0000 mg | ORAL_TABLET | Freq: Once | ORAL | Status: AC
  Administered 2017-08-03: 650 mg via ORAL
  Filled 2017-08-03: qty 2

## 2017-08-03 MED ORDER — DEXAMETHASONE SODIUM PHOSPHATE 100 MG/10ML IJ SOLN
20.0000 mg | Freq: Once | INTRAMUSCULAR | Status: AC
  Administered 2017-08-03: 20 mg via INTRAVENOUS
  Filled 2017-08-03: qty 2

## 2017-08-03 MED ORDER — HEPARIN SOD (PORK) LOCK FLUSH 100 UNIT/ML IV SOLN
500.0000 [IU] | Freq: Once | INTRAVENOUS | Status: AC | PRN
  Administered 2017-08-03: 500 [IU]
  Filled 2017-08-03: qty 5

## 2017-08-03 MED ORDER — VINCRISTINE SULFATE CHEMO INJECTION 1 MG/ML
2.0000 mg | Freq: Once | INTRAVENOUS | Status: AC
  Administered 2017-08-03: 2 mg via INTRAVENOUS
  Filled 2017-08-03: qty 2

## 2017-08-03 NOTE — Progress Notes (Signed)
Here for follow up. Stated doing well. Per pt saw urologist recently and started new med for frequency-cant recall name.

## 2017-08-12 ENCOUNTER — Other Ambulatory Visit: Payer: Self-pay

## 2017-08-12 ENCOUNTER — Encounter
Admission: RE | Admit: 2017-08-12 | Discharge: 2017-08-12 | Disposition: A | Discharge status: Home / Self Care | Payer: Medicare HMO | Source: Ambulatory Visit | Attending: Urology | Admitting: Urology

## 2017-08-12 DIAGNOSIS — Z01812 Encounter for preprocedural laboratory examination: Secondary | ICD-10-CM | POA: Insufficient documentation

## 2017-08-12 NOTE — Patient Instructions (Signed)
Your procedure is scheduled on: Friday 08/19/17 Report to [facility]. 2ND FLOOR MEDICAL MALL ENTRANCE. To find out your arrival time please call [phone] between 1PM - 3PM on Thursday 08/18/17.  Remember: Instructions that are not followed completely may result in serious medical risk, up to and including death, or upon the discretion of your surgeon and anesthesiologist your surgery may need to be rescheduled.    __X__ 1. Do not eat anything after midnight the night before your    procedure.  No gum chewing or hard candies.  You may drink clear   liquids up to 2 hours before you are scheduled to arrive at the   hospital for your procedure. Do not drink clear liquids within 2   hours of scheduled arrival to the hospital as this may lead to your   procedure being delayed or rescheduled.       Clear liquids include:   Water or Apple juice without pulp   Clear carbohydrate beverage such as Clearfast or Gatorade   Black coffee or Clear Tea (no milk, no creamer, do not add anything   to the coffee or tea)    Diabetics should only drink water   __X__ 2. No Alcohol for 24 hours before or after surgery.   ____ 3. Bring all medications with you on the day of surgery if instructed.    __X__ 4. Notify your doctor if there is any change in your medical condition     (cold, fever, infections).             __X___5. No smoking within 24 hours of your surgery.     Do not wear jewelry, make-up, hairpins, clips or nail polish.  Do not wear lotions, powders, or perfumes.   Do not shave 48 hours prior to surgery. Men may shave face and neck.  Do not bring valuables to the hospital.    [facility] is not responsible for any belongings or valuables.               Contacts, dentures or bridgework may not be worn into surgery.  Leave your suitcase in the car. After surgery it may be brought to your room.  For patients admitted to the hospital, discharge time is determined by your                 treatment team.   Patients discharged the day of surgery will not be allowed to drive home.   Please read over the following fact sheets that you were given:   MRSA Information   __X__ Take these medicines the morning of surgery with A SIP OF WATER:    1. HYDRALAZINE  2.   3.   4.  5.  6.  ____ Fleet Enema (as directed)   ____ Use CHG Soap/SAGE wipes as directed  ____ Use inhalers on the day of surgery  ____ Stop metformin 2 days prior to surgery    ____ Take 1/2 of usual insulin dose the night before surgery and none on the morning of surgery.   __X__ Stop Coumadin/Plavix/aspirin on 08/12/17  __X__ Stop Anti-inflammatories such as Advil, Aleve, Ibuprofen, Motrin, Naproxen, Naprosyn, Goodies,powder, or aspirin products.  OK to take Tylenol.   __X__ Stop supplements, Vitamin E, Fish Oil until after surgery.    ____ Bring C-Pap to the hospital.

## 2017-08-13 LAB — URINE CULTURE: Culture: NO GROWTH

## 2017-08-17 ENCOUNTER — Telehealth: Payer: Self-pay | Admitting: Cardiovascular Disease

## 2017-08-17 ENCOUNTER — Inpatient Hospital Stay: Payer: Medicare HMO | Attending: Oncology

## 2017-08-17 ENCOUNTER — Other Ambulatory Visit: Payer: Self-pay | Admitting: Oncology

## 2017-08-17 DIAGNOSIS — R5383 Other fatigue: Secondary | ICD-10-CM | POA: Diagnosis not present

## 2017-08-17 DIAGNOSIS — F17210 Nicotine dependence, cigarettes, uncomplicated: Secondary | ICD-10-CM | POA: Diagnosis not present

## 2017-08-17 DIAGNOSIS — C8339 Diffuse large B-cell lymphoma, extranodal and solid organ sites: Secondary | ICD-10-CM | POA: Insufficient documentation

## 2017-08-17 DIAGNOSIS — D6481 Anemia due to antineoplastic chemotherapy: Secondary | ICD-10-CM | POA: Insufficient documentation

## 2017-08-17 DIAGNOSIS — Z5112 Encounter for antineoplastic immunotherapy: Secondary | ICD-10-CM | POA: Diagnosis not present

## 2017-08-17 DIAGNOSIS — Z5111 Encounter for antineoplastic chemotherapy: Secondary | ICD-10-CM | POA: Diagnosis not present

## 2017-08-17 LAB — COMPREHENSIVE METABOLIC PANEL
ALBUMIN: 3.6 g/dL (ref 3.5–5.0)
ALK PHOS: 98 U/L (ref 38–126)
ALT: 12 U/L — ABNORMAL LOW (ref 17–63)
AST: 15 U/L (ref 15–41)
Anion gap: 8 (ref 5–15)
BILIRUBIN TOTAL: 0.5 mg/dL (ref 0.3–1.2)
BUN: 14 mg/dL (ref 6–20)
CALCIUM: 9 mg/dL (ref 8.9–10.3)
CO2: 26 mmol/L (ref 22–32)
CREATININE: 0.94 mg/dL (ref 0.61–1.24)
Chloride: 105 mmol/L (ref 101–111)
GFR calc Af Amer: >60 mL/min (ref >60)
GFR calc non Af Amer: >60 mL/min (ref >60)
GLUCOSE: 105 mg/dL — AB (ref 65–99)
POTASSIUM: 4.1 mmol/L (ref 3.5–5.1)
Sodium: 139 mmol/L (ref 135–145)
TOTAL PROTEIN: 6.7 g/dL (ref 6.5–8.1)

## 2017-08-17 LAB — CBC WITH DIFFERENTIAL/PLATELET
BASOS PCT: 1 %
Basophils Absolute: 0.1 10*3/uL (ref 0–0.1)
Eosinophils Absolute: 0.1 10*3/uL (ref 0–0.7)
Eosinophils Relative: 1 %
HEMATOCRIT: 29.6 % — AB (ref 40.0–52.0)
Hemoglobin: 10.2 g/dL — ABNORMAL LOW (ref 13.0–18.0)
LYMPHS ABS: 0.4 10*3/uL — AB (ref 1.0–3.6)
Lymphocytes Relative: 6 %
MCH: 30.6 pg (ref 26.0–34.0)
MCHC: 34.6 g/dL (ref 32.0–36.0)
MCV: 88.5 fL (ref 80.0–100.0)
MONOS PCT: 10 %
Monocytes Absolute: 0.7 10*3/uL (ref 0.2–1.0)
Neutro Abs: 5.9 10*3/uL (ref 1.4–6.5)
Neutrophils Relative %: 82 %
Platelets: 121 10*3/uL — ABNORMAL LOW (ref 150–440)
RBC: 3.35 MIL/uL — AB (ref 4.40–5.90)
RDW: 15.9 % — ABNORMAL HIGH (ref 11.5–14.5)
WBC: 7.2 10*3/uL (ref 3.8–10.6)

## 2017-08-17 MED ORDER — SUCCINYLCHOLINE CHLORIDE 20 MG/ML IJ SOLN
INTRAMUSCULAR | Status: AC
  Filled 2017-08-17: qty 1

## 2017-08-17 MED ORDER — PHENYLEPHRINE HCL 10 MG/ML IJ SOLN
INTRAMUSCULAR | Status: AC
  Filled 2017-08-17: qty 1

## 2017-08-17 MED ORDER — PROPOFOL 10 MG/ML IV BOLUS
INTRAVENOUS | Status: AC
  Filled 2017-08-17: qty 20

## 2017-08-17 MED ORDER — EPHEDRINE SULFATE 50 MG/ML IJ SOLN
INTRAMUSCULAR | Status: AC
  Filled 2017-08-17: qty 1

## 2017-08-17 NOTE — Telephone Encounter (Signed)
Arbie Cookey from Medical Records at [facility] is calling in regards to request of medical records States the microchip from 2010 has been destroyed so there are no medical records to be sent

## 2017-08-18 MED ORDER — CEFAZOLIN SODIUM-DEXTROSE 2-4 GM/100ML-% IV SOLN: 2.0000 g | INTRAVENOUS | Status: DC

## 2017-08-18 NOTE — Telephone Encounter (Signed)
Can we let the patient know Does he have any records from back then?

## 2017-08-18 NOTE — Telephone Encounter (Signed)
Notified patient that microchip from Records at [facility] had been destroyed. He was understanding. He is going to try and reach out to Dr Donnie Mesa from [facility] [place] to see if he can locate records as well. Patient denies having any records himself.

## 2017-08-19 ENCOUNTER — Ambulatory Visit: Payer: Medicare HMO | Admitting: Anesthesiology

## 2017-08-19 ENCOUNTER — Encounter
Admission: RE | Disposition: A | Discharge status: Home / Self Care | Payer: Self-pay | Source: Ambulatory Visit | Attending: Urology

## 2017-08-19 ENCOUNTER — Encounter: Payer: Self-pay | Admitting: *Deleted

## 2017-08-19 ENCOUNTER — Other Ambulatory Visit: Payer: Self-pay | Admitting: Urology

## 2017-08-19 ENCOUNTER — Other Ambulatory Visit: Payer: Self-pay

## 2017-08-19 ENCOUNTER — Ambulatory Visit
Admission: RE | Admit: 2017-08-19 | Discharge: 2017-08-19 | Disposition: A | Discharge status: Home / Self Care | Payer: Medicare HMO | Source: Ambulatory Visit | Attending: Urology | Admitting: Urology

## 2017-08-19 ENCOUNTER — Telehealth: Payer: Self-pay | Admitting: Urology

## 2017-08-19 DIAGNOSIS — Z79899 Other long term (current) drug therapy: Secondary | ICD-10-CM | POA: Insufficient documentation

## 2017-08-19 DIAGNOSIS — N1330 Unspecified hydronephrosis: Secondary | ICD-10-CM | POA: Diagnosis not present

## 2017-08-19 DIAGNOSIS — J449 Chronic obstructive pulmonary disease, unspecified: Secondary | ICD-10-CM | POA: Insufficient documentation

## 2017-08-19 DIAGNOSIS — F17210 Nicotine dependence, cigarettes, uncomplicated: Secondary | ICD-10-CM | POA: Insufficient documentation

## 2017-08-19 DIAGNOSIS — Z466 Encounter for fitting and adjustment of urinary device: Secondary | ICD-10-CM | POA: Diagnosis not present

## 2017-08-19 DIAGNOSIS — Z87891 Personal history of nicotine dependence: Secondary | ICD-10-CM | POA: Insufficient documentation

## 2017-08-19 DIAGNOSIS — I10 Essential (primary) hypertension: Secondary | ICD-10-CM | POA: Insufficient documentation

## 2017-08-19 DIAGNOSIS — Z7982 Long term (current) use of aspirin: Secondary | ICD-10-CM | POA: Diagnosis not present

## 2017-08-19 DIAGNOSIS — C8330 Diffuse large B-cell lymphoma, unspecified site: Secondary | ICD-10-CM | POA: Insufficient documentation

## 2017-08-19 HISTORY — PX: CYSTOSCOPY W/ RETROGRADES: SHX1426

## 2017-08-19 HISTORY — PX: CYSTOSCOPY W/ URETERAL STENT REMOVAL: SHX1430

## 2017-08-19 SURGERY — REMOVAL, STENT, URETER, CYSTOSCOPIC
Anesthesia: General | Site: Ureter | Laterality: Left | Wound class: Clean Contaminated

## 2017-08-19 MED ORDER — LACTATED RINGERS IV SOLN
INTRAVENOUS | Status: DC | PRN
  Administered 2017-08-19: 09:00:00 via INTRAVENOUS

## 2017-08-19 MED ORDER — IOTHALAMATE MEGLUMINE 43 % IV SOLN
INTRAVENOUS | Status: DC | PRN
  Administered 2017-08-19: 15 mL via URETHRAL

## 2017-08-19 MED ORDER — FENTANYL CITRATE (PF) 100 MCG/2ML IJ SOLN
INTRAMUSCULAR | Status: DC | PRN
  Administered 2017-08-19: 25 ug via INTRAVENOUS

## 2017-08-19 MED ORDER — FENTANYL CITRATE (PF) 100 MCG/2ML IJ SOLN
INTRAMUSCULAR | Status: AC
  Filled 2017-08-19: qty 2

## 2017-08-19 MED ORDER — FENTANYL CITRATE (PF) 100 MCG/2ML IJ SOLN: 25.0000 ug | INTRAMUSCULAR | Status: DC | PRN

## 2017-08-19 MED ORDER — PROPOFOL 10 MG/ML IV BOLUS
INTRAVENOUS | Status: AC
  Filled 2017-08-19: qty 20

## 2017-08-19 MED ORDER — FAMOTIDINE 20 MG PO TABS
ORAL_TABLET | ORAL | Status: AC
  Filled 2017-08-19: qty 1

## 2017-08-19 MED ORDER — ONDANSETRON HCL 4 MG/2ML IJ SOLN
INTRAMUSCULAR | Status: DC | PRN
  Administered 2017-08-19: 4 mg via INTRAVENOUS

## 2017-08-19 MED ORDER — CEFAZOLIN SODIUM-DEXTROSE 2-4 GM/100ML-% IV SOLN
INTRAVENOUS | Status: AC
  Filled 2017-08-19: qty 100

## 2017-08-19 MED ORDER — FAMOTIDINE 20 MG PO TABS
20.0000 mg | ORAL_TABLET | Freq: Once | ORAL | Status: AC
  Administered 2017-08-19: 20 mg via ORAL

## 2017-08-19 MED ORDER — ONDANSETRON HCL 4 MG/2ML IJ SOLN: 4.0000 mg | Freq: Once | INTRAMUSCULAR | Status: DC | PRN

## 2017-08-19 MED ORDER — LIDOCAINE HCL (CARDIAC) 20 MG/ML IV SOLN
INTRAVENOUS | Status: DC | PRN
  Administered 2017-08-19: 100 mg via INTRAVENOUS

## 2017-08-19 MED ORDER — PROPOFOL 10 MG/ML IV BOLUS
INTRAVENOUS | Status: AC
Start: 2017-08-19 — End: 2017-08-19
  Filled 2017-08-19: qty 20

## 2017-08-19 MED ORDER — CEFAZOLIN SODIUM-DEXTROSE 2-3 GM-%(50ML) IV SOLR
INTRAVENOUS | Status: DC | PRN
  Administered 2017-08-19: 2 g via INTRAVENOUS

## 2017-08-19 MED ORDER — PROPOFOL 10 MG/ML IV BOLUS
INTRAVENOUS | Status: DC | PRN
  Administered 2017-08-19: 200 mg via INTRAVENOUS

## 2017-08-19 MED ORDER — LACTATED RINGERS IV SOLN
INTRAVENOUS | Status: DC
  Administered 2017-08-19: 09:00:00 via INTRAVENOUS

## 2017-08-19 SURGICAL SUPPLY — 20 items
BAG DRAIN CYSTO-URO LG1000N (MISCELLANEOUS) ×3 IMPLANT
BRUSH SCRUB EZ  4% CHG (MISCELLANEOUS) ×1
BRUSH SCRUB EZ 4% CHG (MISCELLANEOUS) ×2 IMPLANT
CATH URETL 5X70 OPEN END (CATHETERS) ×3 IMPLANT
CONRAY 43 FOR UROLOGY 50M (MISCELLANEOUS) ×3 IMPLANT
GLOVE BIOGEL PI IND STRL 8 (GLOVE) ×2 IMPLANT
GLOVE BIOGEL PI INDICATOR 8 (GLOVE) ×1
GOWN STANDARD XL  REUSABL (MISCELLANEOUS) ×3 IMPLANT
KIT RM TURNOVER CYSTO AR (KITS) ×3 IMPLANT
PACK CYSTO AR (MISCELLANEOUS) ×3 IMPLANT
SENSORWIRE 0.038 NOT ANGLED (WIRE) ×3
SET CYSTO W/LG BORE CLAMP LF (SET/KITS/TRAYS/PACK) ×3 IMPLANT
SOL .9 NS 3000ML IRR  AL (IV SOLUTION) ×1
SOL .9 NS 3000ML IRR UROMATIC (IV SOLUTION) ×2 IMPLANT
STENT URET 6FRX24 CONTOUR (STENTS) IMPLANT
STENT URET 6FRX26 CONTOUR (STENTS) IMPLANT
SURGILUBE 2OZ TUBE FLIPTOP (MISCELLANEOUS) ×3 IMPLANT
SYRINGE IRR TOOMEY STRL 70CC (SYRINGE) ×3 IMPLANT
WATER STERILE IRR 1000ML POUR (IV SOLUTION) ×3 IMPLANT
WIRE SENSOR 0.038 NOT ANGLED (WIRE) ×2 IMPLANT

## 2017-08-19 NOTE — Anesthesia Post-op Follow-up Note (Signed)
Anesthesia QCDR form completed.        

## 2017-08-19 NOTE — Telephone Encounter (Signed)
Done ° ° °Michelle °

## 2017-08-19 NOTE — Interval H&P Note (Signed)
History and Physical Interval Note:  08/19/2017 8:53 AM  [name]  has presented today for surgery, with the diagnosis of Left hydronephrosis  The various methods of treatment have been discussed with the patient and family. After consideration of risks, benefits and other options for treatment, the patient has consented to  Procedure(s): CYSTOSCOPY WITH STENT REMOVAL (Left) CYSTOSCOPY WITH STENT REPLACEMENT (Left) as a surgical intervention .  The patient's history has been reviewed, patient examined, no change in status, stable for surgery.  I have reviewed the patient's chart and labs.  Questions were answered to the patient's satisfaction.     [place]

## 2017-08-19 NOTE — Transfer of Care (Signed)
Immediate Anesthesia Transfer of Care Note  Patient: John Gallegos  Procedure(s) Performed: CYSTOSCOPY WITH STENT REMOVAL (Left Ureter) CYSTOSCOPY WITH RETROGRADE PYELOGRAM (Left Ureter)  Patient Location: PACU  Anesthesia Type:General  Level of Consciousness: awake, alert  and oriented  Airway & Oxygen Therapy: Patient Spontanous Breathing  Post-op Assessment: Report given to RN and Post -op Vital signs reviewed and stable  Post vital signs: stable  Last Vitals:  Vitals:   08/19/17 0826  BP: (!) 158/86  Pulse: (!) 59  Resp: 18  Temp: 36.4 C  SpO2: 99%    Last Pain:  Vitals:   08/19/17 0826  TempSrc: Oral  PainSc: 0-No pain         Complications: No apparent anesthesia complications

## 2017-08-19 NOTE — Anesthesia Preprocedure Evaluation (Signed)
Anesthesia Evaluation  Patient identified by MRN, date of birth, ID band Patient awake    Reviewed: Allergy & Precautions, NPO status , Patient's Chart, lab work & pertinent test results  History of Anesthesia Complications Negative for: history of anesthetic complications  Airway Mallampati: II  TM Distance: >3 FB Neck ROM: Full    Dental  (+) Poor Dentition, Chipped   Pulmonary neg sleep apnea, neg COPD, Current Smoker, former smoker,    breath sounds clear to auscultation- rhonchi (-) wheezing      Cardiovascular Exercise Tolerance: Good hypertension, Pt. on medications (-) CAD, (-) Past MI and (-) Cardiac Stents  Rhythm:Regular Rate:Normal - Systolic murmurs and - Diastolic murmurs    Neuro/Psych negative neurological ROS  negative psych ROS   GI/Hepatic negative GI ROS, Neg liver ROS,   Endo/Other  negative endocrine ROSneg diabetes  Renal/GU Renal InsufficiencyRenal disease (ureteral mass)     Musculoskeletal  (+) Arthritis ,   Abdominal (+) - obese,   Peds  Hematology negative hematology ROS (+)   Anesthesia Other Findings Past Medical History: No date: Gout No date: Hypertension   Reproductive/Obstetrics                             Anesthesia Physical  Anesthesia Plan  ASA: II  Anesthesia Plan: General   Post-op Pain Management:    Induction: Intravenous  PONV Risk Score and Plan: 2 and Ondansetron and Dexamethasone  Airway Management Planned: LMA  Additional Equipment:   Intra-op Plan:   Post-operative Plan: Extubation in OR  Informed Consent: I have reviewed the patients History and Physical, chart, labs and discussed the procedure including the risks, benefits and alternatives for the proposed anesthesia with the patient or authorized representative who has indicated his/her understanding and acceptance.   Dental advisory given  Plan Discussed with: CRNA  and Anesthesiologist  Anesthesia Plan Comments:         Anesthesia Quick Evaluation

## 2017-08-19 NOTE — Anesthesia Procedure Notes (Signed)
Procedure Name: LMA Insertion Date/Time: 08/19/2017 9:20 AM Performed by: Willette Alma, CRNA Pre-anesthesia Checklist: Patient identified, Emergency Drugs available, Suction available, Patient being monitored and Timeout performed Patient Re-evaluated:Patient Re-evaluated prior to induction Oxygen Delivery Method: Circle system utilized Preoxygenation: Pre-oxygenation with 100% oxygen Induction Type: IV induction Ventilation: Mask ventilation without difficulty LMA: LMA inserted LMA Size: 5.0 Number of attempts: 1 Placement Confirmation: positive ETCO2,  CO2 detector and breath sounds checked- equal and bilateral Tube secured with: Tape Dental Injury: Teeth and Oropharynx as per pre-operative assessment

## 2017-08-19 NOTE — Anesthesia Postprocedure Evaluation (Signed)
Anesthesia Post Note  Patient: Jden Want Zou  Procedure(s) Performed: CYSTOSCOPY WITH STENT REMOVAL (Left Ureter) CYSTOSCOPY WITH RETROGRADE PYELOGRAM (Left Ureter)  Patient location during evaluation: PACU Anesthesia Type: General Level of consciousness: awake and alert Pain management: pain level controlled Vital Signs Assessment: post-procedure vital signs reviewed and stable Respiratory status: spontaneous breathing, nonlabored ventilation, respiratory function stable and patient connected to nasal cannula oxygen Cardiovascular status: blood pressure returned to baseline and stable Postop Assessment: no apparent nausea or vomiting Anesthetic complications: no     Last Vitals:  Vitals:   08/19/17 1024 08/19/17 1048  BP: (!) 152/80 (!) 146/79  Pulse: (!) 57 (!) 53  Resp: 16   Temp: (!) 36.2 C   SpO2: 98% 100%    Last Pain:  Vitals:   08/19/17 1024  TempSrc: Temporal  PainSc:                  Martha Clan

## 2017-08-19 NOTE — Op Note (Signed)
Date of procedure: 08/19/17  Preoperative diagnosis:  1. Left hydronephrosis secondary to diffuse B-cell lymphoma  Postoperative diagnosis:  1. Left hydronephrosis-resolved  Procedure: 1. Cystoscopy with removal of left ureteral stent 2. Left retrograde pyelogram  Surgeon: John Giovanni, MD  Anesthesia: General  Complications: None  Intraoperative findings:  Left retrograde pyelogram remarkable for normal-appearing ureter and normal appearing renal pelvis and delicate collecting system.  Prompt contrast drainage noted on real-time fluoroscopy.  EBL: None  Specimens: None  Drains: None  Indication: DELOY ARCHEY is a 72 y.o. patient with moderate left hydronephrosis secondary to a large 4 cm mass causing extrinsic compression on the left proximal ureter.  This was found to be diffuse large B-cell lymphoma. He had a left ureteral stent placed on 05/13/2017.  He has received treatment and recent PET scan showed marked improvement/resolution in the left periureteral mass.  After reviewing the management options for treatment, he elected to proceed with the above surgical procedure(s). We have discussed the potential benefits and risks of the procedure, side effects of the proposed treatment, the likelihood of the patient achieving the goals of the procedure, and any potential problems that might occur during the procedure or recuperation. Informed consent has been obtained.  Description of procedure:  The patient was taken to the operating room and general anesthesia was induced.  The patient was placed in the dorsal lithotomy position, prepped and draped in the usual sterile fashion, and preoperative antibiotics were administered. A preoperative time-out was performed.   A 22 French cystoscope was lubricated and passed under direct vision.  The urethra was normal caliber without stricture.  The prostate demonstrated moderate lateral lobe enlargement and bladder neck elevation.   Panendoscopy was performed and no bladder mucosal lesions were identified.  The left ureteral stent was easily identified, grasped with endoscopic forceps and brought to the urethral meatus.  A 0.038 Sensor wire was placed through the stent and advanced proximally.  A 6 French open-ended ureteral catheter was then placed over the wire and the wire was removed.  Left retrograde pyelogram was performed with findings as described above.  Based on these findings it was elected not to replace his ureteral stent.  The bladder was emptied and the cystoscope was removed.  After anesthetic reversal he was transported to the PACU in stable condition.  Plan: Follow-up visit with renal ultrasound in 4 weeks.  John Giovanni, M.D.

## 2017-08-19 NOTE — Telephone Encounter (Signed)
-----   Message from Abbie Sons, MD sent at 08/19/2017  9:53 AM EST ----- Please schedule 1 month follow-up with renal ultrasound.

## 2017-08-23 NOTE — Progress Notes (Signed)
Hematology/Oncology follow-up note [facility] Telephone:([phone] [phone]   Patient Care Team: Tracie Harrier, MD as PCP - General (Internal Medicine)  REFERRING PROVIDER: Tracie Harrier, MD  REASON FOR VISIT Follow up for treatment of diffuse large B cell lymphoma.   HISTORY OF PRESENTING ILLNESS:  John Gallegos is a  72 y.o.  male with PMH listed below presented for follow-up for treatment of diffuse large B cell lymphoma.  Pertinent history of oncology workup includes: Patient was found to have microscopic hematuria on 02/23/2017 with 4-10 RBC's/hpf.    Urine culture was negative.having symptoms of nocturia He is smoker. 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 He was in the [employer] and reports to be exposed to [place] in [place]. CT scan 04/20/2017 which revealed moderate left hydronephrosis and delayed excretion down to a large 4.4 cm mass in or around the left proximal ureter. There was also hazy soft tissue in the fat surrounding the celiac axis and superior mesenteric artery. Thickening of the right diaphragmatic crus. High gastrohepatic ligament lymphadenopathy measures up to 11 mm. Peripancreatic and root of small bowel mesentery adenopathy measures up to 2.6 cm.  He was seen by urologist Dr. Pilar Jarvis and he underwent cystoscopy, left retrograde pyelogram, left ureteroscopy and placement of left ureteral stent placement on 05/13/2017. Finding during the procedure that he has extrinsic compression of the ureter, possibly from lymphoma.   PET scan 05/18/2017  IMPRESSION: 1. Multifocal metastatic disease within the LEFT retroperitoneum, central mesenteries, and RIGHT paraspinal musculature. 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 Recommend percutaneous biopsy of the RIGHT paraspinal musculature at L2. 2. 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  # Pathology:  Biopsy of paraspinal musculature  at L2. Pathology results reviewed large B cell lymphoma. It is CD20 positive, CD10 positive. Flow cytometry shows a small clonal population of large B cells positive for CD10 and absent cop and lambda light chains. The abnormal cells represent 2% of the total cells.The morphology, initial IHC panel, and flow cytometry supports classification as B-cell lymphoma, CD10 positive, with large cell features.diffuse large B-cell lymphoma, germinal center B-cell type.  Discussed with pathologist Dr.Onley, Ki67 50%, MUM1 negative, MYC negative, Cycline D1 negative, CD30 negative, FISH is positive for BCL2 gene rearrangement. Negative for MYC and BCL6. 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   # Bone marrow Biopsy: negative for lymphoma involvement.  # Treatment:  06/21/2017 Cycle 1 RCHOP [name]. Dexamethasone 74m on Day 1,  Prednisone 80 mg daily Day 2-5. 07/12/2017 Cycle 2 RCHOP /[name].Dexamethasone 270mon Day 1, Prednisone 80 mg daily Day 2-5 08/03/2017 Cycle 3 RCHOP/onpro. Prednisone 80 mg daily Day 2-5  INTERVAL HISTORY [name] for evaluation of cycle 4 RCHOP chemotherapy. Patient reports feeling well. During interval as he has had urinary stent removed. He has an upcoming ultrasound kidney to assessed post stent removal.  Patient reports feeling well. Feels fatigue intermittently. He has good appetite.   Review of Systems  Review of Systems  Constitutional: Positive for malaise/fatigue. Negative for fever and weight loss.  HENT: Negative for hearing loss, nosebleeds and tinnitus.   Eyes: Negative for blurred vision, photophobia and redness.  Respiratory: Negative for cough, hemoptysis and wheezing.   Cardiovascular: Negative for chest pain, palpitations, orthopnea, leg swelling and PND.  Gastrointestinal: Negative for constipation, heartburn, nausea and vomiting.  Genitourinary: Negative for dysuria and urgency.  Musculoskeletal:  Negative  for joint pain, myalgias and neck pain.       Chronic back pain  Skin: Negative for rash.  Neurological: Negative for dizziness, tingling, sensory change, focal weakness and headaches.  Endo/Heme/Allergies: Negative for environmental allergies. Does not bruise/bleed easily.  Psychiatric/Behavioral: Negative for depression and suicidal ideas. The patient is not nervous/anxious and does not have insomnia.    MEDICAL HISTORY:  Past Medical History:  Diagnosis Date  . Cancer ([place])    non hodgkin Bcell lymphoma  . Gout   . Hard of hearing   . Hypertension   . Urinary incontinence     SURGICAL HISTORY: Past Surgical History:  Procedure Laterality Date  . CYSTOSCOPY W/ RETROGRADES Left 08/19/2017   Procedure: CYSTOSCOPY WITH RETROGRADE PYELOGRAM;  Surgeon: Abbie Sons, MD;  Location: [facility];  Service: Urology;  Laterality: Left;  . CYSTOSCOPY W/ URETERAL STENT REMOVAL Left 08/19/2017   Procedure: CYSTOSCOPY WITH STENT REMOVAL;  Surgeon: Abbie Sons, MD;  Location: [facility];  Service: Urology;  Laterality: Left;  . CYSTOSCOPY WITH BIOPSY Left 05/13/2017   Procedure: CYSTOSCOPY WITH URETERAL BIOPSY;  Surgeon: Nickie Retort, MD;  Location: [facility];  Service: Urology;  Laterality: Left;  . CYSTOSCOPY WITH STENT PLACEMENT Left 05/13/2017   Procedure: CYSTOSCOPY WITH STENT PLACEMENT;  Surgeon: Nickie Retort, MD;  Location: [facility];  Service: Urology;  Laterality: Left;  . PORTA CATH INSERTION N/A 06/14/2017   Procedure: PORTA CATH INSERTION;  Surgeon: Algernon Huxley, MD;  Location: [place] [facility];  Service: Cardiovascular;  Laterality: N/A;  . 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 right knne surgery     needle went through knee  . TONSILLECTOMY    . URETEROSCOPY Left 05/13/2017   Procedure: URETEROSCOPY;  Surgeon: Nickie Retort, MD;  Location: [facility];  Service: Urology;  Laterality: Left;    SOCIAL HISTORY: Social History   Socioeconomic History  .  Marital status: Married    Spouse name: Not on file  . Number of children: Not on file  . Years of education: Not on file  . Highest education level: Not on file  Social Needs  . Financial resource strain: Not on file  . Food insecurity - worry: Not on file  . Food insecurity - inability: Not on file  . Transportation needs - medical: Not on file  . Transportation needs - non-medical: Not on file  Occupational History  . Not on file  Tobacco Use  . Smoking status: Former Smoker    Packs/day: 0.50    Years: 50.00    Pack years: 25.00    Types: Cigarettes  . Smokeless tobacco: Never Used  Substance and Sexual Activity  . Alcohol use: Yes    Alcohol/week: 1.8 oz    Types: 3 Shots of liquor per week  . Drug use: No  . Sexual activity: Not on file  Other Topics Concern  . Not on file  Social History Narrative  . Not on file    FAMILY HISTORY: Family History  Problem Relation Age of Onset  . Pancreatic cancer Mother   . Aneurysm Mother   . Lung cancer Father   . Lymphoma Sister   . Prostate cancer Neg Hx   . Kidney cancer Neg Hx   . Bladder Cancer Neg Hx     ALLERGIES:  has No Known Allergies.  MEDICATIONS:  Current Outpatient Medications  Medication Sig Dispense Refill  . acetaminophen (TYLENOL) 500 MG  tablet Take 500 mg by mouth every 6 (six) hours as needed for mild pain or moderate pain.    Marland Kitchen amLODipine (NORVASC) 10 MG tablet Take 10 mg by mouth every evening.     Marland Kitchen aspirin EC 81 MG tablet Take 81 mg by mouth daily.    . hydrALAZINE (APRESOLINE) 25 MG tablet Take 25 mg by mouth every evening.     Marland Kitchen losartan (COZAAR) 100 MG tablet Take 100 mg by mouth every evening.    . zolpidem (AMBIEN) 10 MG tablet Take 10 mg by mouth at bedtime as needed for sleep.      No current facility-administered medications for this visit.       Marland Kitchen  PHYSICAL EXAMINATION: ECOG PERFORMANCE STATUS: 0 - Asymptomatic Vitals:   08/24/17 0922  BP: (!) 159/92  Pulse: (!) 56  Resp: 18   Temp: (!) 97.1 F (36.2 C)   Filed Weights   08/24/17 0922  Weight: 221 lb (100.2 kg)    Physical Exam  Constitutional: He is oriented to person, place, and time and well-developed, well-nourished, and in no distress. No distress.  HENT:  Head: Normocephalic and atraumatic.  Mouth/Throat: Oropharynx is clear and moist.  Eyes: Conjunctivae and EOM are normal. Pupils are equal, round, and reactive to light. Right eye exhibits no discharge. No scleral icterus.  Neck: Normal range of motion. Neck supple. No JVD present.  Cardiovascular: Normal rate, regular rhythm and normal heart sounds. Exam reveals no gallop.  No murmur heard. Pulmonary/Chest: Effort normal and breath sounds normal. No respiratory distress. He has no rales. He exhibits no tenderness.  Abdominal: Soft. Bowel sounds are normal. There is no tenderness. There is no rebound.  Musculoskeletal: Normal range of motion. He exhibits no deformity.  Neurological: He is alert and oriented to person, place, and time. No cranial nerve deficit. Gait normal. Coordination normal.  Skin: Skin is warm and dry. No rash noted. No erythema.  Psychiatric: Affect and judgment normal.    LABORATORY DATA:  I have reviewed the data as listed Lab Results  Component Value Date   WBC 7.2 08/17/2017   HGB 10.2 (L) 08/17/2017   HCT 29.6 (L) 08/17/2017   MCV 88.5 08/17/2017   PLT 121 (L) 08/17/2017   Recent Labs    07/12/17 0900 08/03/17 0842 08/17/17 1050  NA 138 139 139  K 3.6 3.6 4.1  CL 106 107 105  CO2 '26 25 26  ' GLUCOSE 111* 116* 105*  BUN '18 19 14  ' CREATININE 0.96 0.99 0.94  CALCIUM 9.1 8.8* 9.0  GFRNONAA >60 >60 >60  GFRAA >60 >60 >60  PROT 7.2 6.8 6.7  ALBUMIN 3.7 3.4* 3.6  AST 13* 14* 15  ALT 12* 10* 12*  ALKPHOS 96 88 98  BILITOT 0.6 0.5 0.5    RADIOGRAPHIC STUDIES:O I have personally reviewed the radiological images as listed and agreed with the findings in the report. 05/26/2017 PET scan IMPRESSION: 1.  Multifocal metastatic disease within the LEFT retroperitoneum,central mesenteries, and RIGHT paraspinal musculature. 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 Recommend percutaneous biopsy of theRIGHT paraspinal musculature at L2. 2. 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  Hepatitis B antigen negative. 2-D echo.showed LVEF 60%   PET scan 07/28/2017 IMPRESSION: 1. Marked improvement, with row solution of the prior bony lesions and significant reduction in activity of the central mesenteric and right gastric adenopathy. Central mesenteric nodal is currently Deauville 3, previously Deauville 4 and Deauville 5. 2.  Improvement in the right paraspinal muscular activity, currently Deauville 3, previously Deauville 4.  I also discussed with radiologist that there has been further improvement in the left periureteral density, likely reflecting treatment response. With resolution or near resolution of the associated metabolic activity for example on  fused image 199/603 and the ureter coursing through this region.  It is difficult to predict what will happen if the stent is removed, but my expectation is that the reduced amount of periureteral density and essentially resolved periureteral accentuated metabolic activity will have significantly decreased extrinsic mass effect on the ureter. If it is decided to remove the stent, short-term subsequent surveillance ultrasound would likely be warranted in order to exclude recurrent hydronephrosis.   ASSESSMENT & PLAN:  This is a 72 y.o. male with stage IV diffuse large B-cell lymphoma, on first-line chemotherapy with R CHOP with curative intent, present for follow-up.  1. Diffuse large B-cell lymphoma of extranodal site excluding spleen and other solid organs ([place])   2. Encounter for antineoplastic chemotherapy   3. Other fatigue   4. Anemia due to antineoplastic chemotherapy   -PET scan addendum were discussed in detail  with patient and his wife. Patient has very good treatment response. He is status post  Pat stent removal and have a follow-up ultrasound of kidney. # Okay to proceed cycle 4R CHOP/onpro, planned total 6 cycles. We'll repeat PET scan a few weeks after 6 cycles of R CHOP. # Prognostic Model for the risk of CNS lymphoma involvement per NCCN guideline,  Patient is intermediate risk with score of 2, one from being >60, and one from more than extranodal involvement>1 sites (bone, paraspinal muscle involvement). CNS prophylaxis is usually recommended if score 4-6. Therefore will not offer CNS prophylaxis for him.   # Anemia likely secondary chemotherapy. Continue monitor. No need for transfusion today. # Fatigue secondary to chemotherapy. Continue monitor. - The patient knows to call the clinic with any problems questions or concerns.  Return of visit: lab/MD/RCHOP/Onpro in 3 weeks   Earlie Server, MD, PhD Hematology Oncology [facility] Pager- [phone] 08/24/17

## 2017-08-24 ENCOUNTER — Inpatient Hospital Stay: Payer: Medicare HMO

## 2017-08-24 ENCOUNTER — Encounter: Payer: Self-pay | Admitting: Oncology

## 2017-08-24 ENCOUNTER — Inpatient Hospital Stay (HOSPITAL_BASED_OUTPATIENT_CLINIC_OR_DEPARTMENT_OTHER): Payer: Medicare HMO | Admitting: Oncology

## 2017-08-24 VITALS — BP 159/92 | HR 56 | Temp 97.1°F | Resp 18 | Wt 221.0 lb

## 2017-08-24 DIAGNOSIS — C8339 Diffuse large B-cell lymphoma, extranodal and solid organ sites: Secondary | ICD-10-CM | POA: Diagnosis not present

## 2017-08-24 DIAGNOSIS — Z5112 Encounter for antineoplastic immunotherapy: Secondary | ICD-10-CM | POA: Diagnosis not present

## 2017-08-24 DIAGNOSIS — D6481 Anemia due to antineoplastic chemotherapy: Secondary | ICD-10-CM

## 2017-08-24 DIAGNOSIS — F17210 Nicotine dependence, cigarettes, uncomplicated: Secondary | ICD-10-CM | POA: Diagnosis not present

## 2017-08-24 DIAGNOSIS — R5383 Other fatigue: Secondary | ICD-10-CM | POA: Diagnosis not present

## 2017-08-24 DIAGNOSIS — T451X5A Adverse effect of antineoplastic and immunosuppressive drugs, initial encounter: Secondary | ICD-10-CM

## 2017-08-24 DIAGNOSIS — Z5111 Encounter for antineoplastic chemotherapy: Secondary | ICD-10-CM | POA: Diagnosis not present

## 2017-08-24 LAB — CBC WITH DIFFERENTIAL/PLATELET
BASOS ABS: 0.1 10*3/uL (ref 0–0.1)
Basophils Relative: 1 %
EOS PCT: 0 %
Eosinophils Absolute: 0 10*3/uL (ref 0–0.7)
HCT: 30.2 % — ABNORMAL LOW (ref 40.0–52.0)
Hemoglobin: 10.4 g/dL — ABNORMAL LOW (ref 13.0–18.0)
LYMPHS PCT: 6 %
Lymphs Abs: 0.3 10*3/uL — ABNORMAL LOW (ref 1.0–3.6)
MCH: 30.6 pg (ref 26.0–34.0)
MCHC: 34.5 g/dL (ref 32.0–36.0)
MCV: 88.9 fL (ref 80.0–100.0)
Monocytes Absolute: 0.7 10*3/uL (ref 0.2–1.0)
Monocytes Relative: 13 %
NEUTROS ABS: 4.6 10*3/uL (ref 1.4–6.5)
Neutrophils Relative %: 80 %
PLATELETS: 240 10*3/uL (ref 150–440)
RBC: 3.39 MIL/uL — AB (ref 4.40–5.90)
RDW: 16.9 % — ABNORMAL HIGH (ref 11.5–14.5)
WBC: 5.8 10*3/uL (ref 3.8–10.6)

## 2017-08-24 LAB — COMPREHENSIVE METABOLIC PANEL
ALT: 12 U/L — ABNORMAL LOW (ref 17–63)
ANION GAP: 6 (ref 5–15)
AST: 16 U/L (ref 15–41)
Albumin: 3.6 g/dL (ref 3.5–5.0)
Alkaline Phosphatase: 81 U/L (ref 38–126)
BILIRUBIN TOTAL: 0.6 mg/dL (ref 0.3–1.2)
BUN: 15 mg/dL (ref 6–20)
CO2: 26 mmol/L (ref 22–32)
Calcium: 9.1 mg/dL (ref 8.9–10.3)
Chloride: 107 mmol/L (ref 101–111)
Creatinine, Ser: 0.9 mg/dL (ref 0.61–1.24)
GFR calc Af Amer: >60 mL/min (ref >60)
Glucose, Bld: 98 mg/dL (ref 65–99)
POTASSIUM: 3.7 mmol/L (ref 3.5–5.1)
Sodium: 139 mmol/L (ref 135–145)
TOTAL PROTEIN: 7 g/dL (ref 6.5–8.1)

## 2017-08-24 MED ORDER — PALONOSETRON HCL INJECTION 0.25 MG/5ML
0.2500 mg | Freq: Once | INTRAVENOUS | Status: AC
  Administered 2017-08-24: 0.25 mg via INTRAVENOUS
  Filled 2017-08-24: qty 5

## 2017-08-24 MED ORDER — SODIUM CHLORIDE 0.9 % IV SOLN: Freq: Once | INTRAVENOUS | Status: DC

## 2017-08-24 MED ORDER — ACETAMINOPHEN 325 MG PO TABS
650.0000 mg | ORAL_TABLET | Freq: Once | ORAL | Status: AC
  Administered 2017-08-24: 650 mg via ORAL
  Filled 2017-08-24: qty 2

## 2017-08-24 MED ORDER — SODIUM CHLORIDE 0.9 % IV SOLN: 375.0000 mg/m2 | Freq: Once | INTRAVENOUS | Status: DC

## 2017-08-24 MED ORDER — PEGFILGRASTIM 6 MG/0.6ML ~~LOC~~ PSKT
6.0000 mg | PREFILLED_SYRINGE | Freq: Once | SUBCUTANEOUS | Status: AC
  Administered 2017-08-24: 6 mg via SUBCUTANEOUS
  Filled 2017-08-24: qty 0.6

## 2017-08-24 MED ORDER — SODIUM CHLORIDE 0.9 % IV SOLN
800.0000 mg | Freq: Once | INTRAVENOUS | Status: AC
  Administered 2017-08-24: 800 mg via INTRAVENOUS
  Filled 2017-08-24: qty 50

## 2017-08-24 MED ORDER — SODIUM CHLORIDE 0.9% FLUSH
10.0000 mL | Freq: Once | INTRAVENOUS | Status: AC
  Administered 2017-08-24: 10 mL via INTRAVENOUS
  Filled 2017-08-24: qty 10

## 2017-08-24 MED ORDER — SODIUM CHLORIDE 0.9 % IV SOLN
20.0000 mg | Freq: Once | INTRAVENOUS | Status: AC
  Administered 2017-08-24: 20 mg via INTRAVENOUS
  Filled 2017-08-24: qty 2

## 2017-08-24 MED ORDER — ONDANSETRON HCL 4 MG/2ML IJ SOLN
4.0000 mg | Freq: Once | INTRAMUSCULAR | Status: AC
  Administered 2017-08-24: 4 mg via INTRAVENOUS

## 2017-08-24 MED ORDER — HEPARIN SOD (PORK) LOCK FLUSH 100 UNIT/ML IV SOLN
500.0000 [IU] | Freq: Once | INTRAVENOUS | Status: AC
  Administered 2017-08-24: 500 [IU] via INTRAVENOUS
  Filled 2017-08-24: qty 5

## 2017-08-24 MED ORDER — VINCRISTINE SULFATE CHEMO INJECTION 1 MG/ML
2.0000 mg | Freq: Once | INTRAVENOUS | Status: AC
  Administered 2017-08-24: 2 mg via INTRAVENOUS
  Filled 2017-08-24: qty 2

## 2017-08-24 MED ORDER — DOXORUBICIN HCL CHEMO IV INJECTION 2 MG/ML
50.0000 mg/m2 | Freq: Once | INTRAVENOUS | Status: AC
  Administered 2017-08-24: 114 mg via INTRAVENOUS
  Filled 2017-08-24: qty 57

## 2017-08-24 MED ORDER — SODIUM CHLORIDE 0.9 % IV SOLN
Freq: Once | INTRAVENOUS | Status: AC
  Administered 2017-08-24: 10:00:00 via INTRAVENOUS
  Filled 2017-08-24: qty 1000

## 2017-08-24 MED ORDER — DIPHENHYDRAMINE HCL 25 MG PO CAPS
50.0000 mg | ORAL_CAPSULE | Freq: Once | ORAL | Status: AC
  Administered 2017-08-24: 50 mg via ORAL
  Filled 2017-08-24: qty 2

## 2017-08-24 MED ORDER — ONDANSETRON HCL 4 MG/2ML IJ SOLN
INTRAMUSCULAR | Status: AC
  Filled 2017-08-24: qty 2

## 2017-08-24 MED ORDER — SODIUM CHLORIDE 0.9 % IV SOLN
750.0000 mg/m2 | Freq: Once | INTRAVENOUS | Status: AC
  Administered 2017-08-24: 1700 mg via INTRAVENOUS
  Filled 2017-08-24: qty 85

## 2017-08-27 DIAGNOSIS — R079 Chest pain, unspecified: Secondary | ICD-10-CM | POA: Insufficient documentation

## 2017-08-27 DIAGNOSIS — F17200 Nicotine dependence, unspecified, uncomplicated: Secondary | ICD-10-CM | POA: Insufficient documentation

## 2017-08-27 NOTE — Progress Notes (Signed)
Cardiology Office Note  Date:  08/29/2017   ID:  John Gallegos, DOB [birth year], MRN [id number]  PCP:  Tracie Harrier, MD   Chief Complaint  Patient presents with  . other    Chest tightness. Meds reviewed verabally with pt.    HPI:  72 year old male with past medical history of Smoker, quit in 05/2017 HTN diffuse large B cell lymphoma (malaise summer 2018, blood in urine) CT scan 04/20/2017 which revealed moderate left hydronephrosis and delayed excretion down to a large 4.4 cmmass in or around the left proximal ureter. Presents by referral from Dr. Tasia Catchings  for consultation of chest pain  He reports fatigue, general malaise after receiving fourth round of chemotherapy recently. Starting to feel somewhat better today .  Denies any significant chest pain on exertion , only some fatigue   No significant shortness of breath   reports that he stop smoking 3 months ago   Does not check blood pressure at home Blood pressures last year office visits well-controlled Blood pressure is year with higher numbers, in office visits Reports he is asymptomatic  06/2017: seen by Dr. Tasia Catchings, had chest pain,  Echo 06/07/2017 Normal EF 60%  CT scan chest reviewed showing significant three-vessel coronary artery disease  EKG personally reviewed by myself on todays visit Shows sinus rate of cardiac rate 56 bpm no significant ST or T-wave changes  previous imaging studies reviewed with him in detail  PET scan 05/18/2017  IMPRESSION: 1. Multifocal metastatic disease within the LEFT retroperitoneum, central mesenteries, and RIGHT paraspinal musculature.Two foci of skeletal metastasis  RCHOP chemotherapy, completed 4 rounds rounds, 2 more to go   episode of chest pressure 06/2017 which resolved spontaneously.  Also had lower extremity swelling which also resolved. He denies any prior chest pain or pressure episode. Denies any history of cardiovascular disease except cholesterol high at one  point. He takes Aspirin 81mg  daily.   PMH:   has a past medical history of Cancer ([place]), Gout, Hard of hearing, Hypertension, and Urinary incontinence.  PSH:    Past Surgical History:  Procedure Laterality Date  . CYSTOSCOPY W/ RETROGRADES Left 08/19/2017   Procedure: CYSTOSCOPY WITH RETROGRADE PYELOGRAM;  Surgeon: Abbie Sons, MD;  Location: [facility];  Service: Urology;  Laterality: Left;  . CYSTOSCOPY W/ URETERAL STENT REMOVAL Left 08/19/2017   Procedure: CYSTOSCOPY WITH STENT REMOVAL;  Surgeon: Abbie Sons, MD;  Location: [facility];  Service: Urology;  Laterality: Left;  . CYSTOSCOPY WITH BIOPSY Left 05/13/2017   Procedure: CYSTOSCOPY WITH URETERAL BIOPSY;  Surgeon: Nickie Retort, MD;  Location: [facility];  Service: Urology;  Laterality: Left;  . CYSTOSCOPY WITH STENT PLACEMENT Left 05/13/2017   Procedure: CYSTOSCOPY WITH STENT PLACEMENT;  Surgeon: Nickie Retort, MD;  Location: [facility];  Service: Urology;  Laterality: Left;  . PORTA CATH INSERTION N/A 06/14/2017   Procedure: PORTA CATH INSERTION;  Surgeon: Algernon Huxley, MD;  Location: [place] [facility];  Service: Cardiovascular;  Laterality: N/A;  . 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  . right knne surgery     needle went through knee  . TONSILLECTOMY    . URETEROSCOPY Left 05/13/2017   Procedure: URETEROSCOPY;  Surgeon: Nickie Retort, MD;  Location: [facility];  Service: Urology;  Laterality: Left;    Current Outpatient Medications  Medication Sig Dispense Refill  . acetaminophen (TYLENOL) 500 MG tablet Take 500 mg by mouth every 6 (six) hours as needed for mild pain or  moderate pain.    Marland Kitchen amLODipine (NORVASC) 10 MG tablet Take 10 mg by mouth every evening.     Marland Kitchen aspirin EC 81 MG tablet Take 81 mg by mouth daily.    . hydrALAZINE (APRESOLINE) 25 MG tablet Take 25 mg by mouth every evening.     Marland Kitchen losartan (COZAAR) 100 MG tablet Take 100 mg by mouth every evening.    . zolpidem (AMBIEN) 10 MG tablet Take 10 mg by  mouth at bedtime as needed for sleep.     Marland Kitchen ezetimibe (ZETIA) 10 MG tablet Take 1 tablet (10 mg total) by mouth daily. 90 tablet 3  . pravastatin (PRAVACHOL) 80 MG tablet Take 1 tablet (80 mg total) by mouth every evening. 90 tablet 3   No current facility-administered medications for this visit.      Allergies:   Patient has no known allergies.   Social History:  The patient  reports that he has quit smoking. His smoking use included cigarettes. He has a 25.00 pack-year smoking history. he has never used smokeless tobacco. He reports that he drinks about 1.8 oz of alcohol per week. He reports that he does not use drugs.   Family History:   family history includes Aneurysm in his mother; Heart attack in his mother; Lung cancer in his father; Lymphoma in his sister; Pancreatic cancer in his mother.    Review of Systems: Review of Systems  Constitutional: Positive for malaise/fatigue.  Respiratory: Negative.   Cardiovascular: Positive for chest pain.  Gastrointestinal: Negative.   Musculoskeletal: Negative.   Neurological: Positive for weakness.  Psychiatric/Behavioral: Negative.   All other systems reviewed and are negative.    PHYSICAL EXAM: VS:  BP (!) 170/90 (BP Location: Right Arm, Patient Position: Sitting, Cuff Size: Normal)   Pulse (!) 56   Ht 6\' 1"  (1.854 m)   Wt 230 lb 4 oz (104.4 kg)   BMI 30.38 kg/m  , BMI Body mass index is 30.38 kg/m. GEN: Well nourished, well developed, in no acute distress  HEENT: normal  Neck: no JVD, carotid bruits, or masses Cardiac: RRR; no murmurs, rubs, or gallops,no edema  Respiratory:  Moderately decreased breath sounds throughout,  normal work of breathing GI: soft, nontender, nondistended, + BS MS: no deformity or atrophy  Skin: warm and dry, no rash Neuro:  Strength and sensation are intact Psych: euthymic mood, full affect   Recent Labs: 08/24/2017: ALT 12; BUN 15; Creatinine, Ser 0.90; Hemoglobin 10.4; Platelets 240;  Potassium 3.7; Sodium 139    Lipid Panel No results found for: CHOL, HDL, LDLCALC, TRIG    Wt Readings from Last 3 Encounters:  08/29/17 230 lb 4 oz (104.4 kg)  08/24/17 221 lb (100.2 kg)  08/12/17 225 lb (102.1 kg)       ASSESSMENT AND PLAN:  Diffuse large B-cell lymphoma of extranodal site excluding spleen and other solid organs (HCC) Completed fourth round of RCHOP Reports he is hanging in there  Mixed hyperlipidemia Recommended he start Zetia with his pravastatin Goal LDL less than 70 given significant three-vessel coronary calcification on CT scan  Essential hypertension Blood pressure elevated even on recheck Recommended he buy blood pressure cuff and check blood pressure at home Call our office if numbers run high  Chest pain with moderate risk for cardiac etiology Will plan stress test as below after chemotherapy at his request  Smoker Stop smoking 3 months ago  Coronary artery disease of native artery of native heart with stable angina  pectoris ([place]) Notes from oncology indicating some previous chest pain symptoms Long history of smoking Significant coronary disease on CT scan Images reviewed with him in detail Recommended outpatient stress test after chemotherapy is completed unable to treadmill, would have to be pharmacological Myoview   Total encounter time more than 45 minutes  Greater than 50% was spent in counseling and coordination of care with the patient   Disposition:   F/U  12 mo  No orders of the defined types were placed in this encounter.    Signed, Esmond Plants, M.D., Ph.D. 08/29/2017  [place], [place]

## 2017-08-29 ENCOUNTER — Encounter: Payer: Self-pay | Admitting: Cardiovascular Disease

## 2017-08-29 ENCOUNTER — Ambulatory Visit: Payer: Medicare HMO | Admitting: Cardiovascular Disease

## 2017-08-29 VITALS — BP 170/90 | HR 56 | Ht 73.0 in | Wt 230.2 lb

## 2017-08-29 DIAGNOSIS — I10 Essential (primary) hypertension: Secondary | ICD-10-CM

## 2017-08-29 DIAGNOSIS — C83398 Diffuse large b-cell lymphoma of other extranodal and solid organ sites: Secondary | ICD-10-CM

## 2017-08-29 DIAGNOSIS — C8339 Diffuse large B-cell lymphoma, extranodal and solid organ sites: Secondary | ICD-10-CM | POA: Diagnosis not present

## 2017-08-29 DIAGNOSIS — F17200 Nicotine dependence, unspecified, uncomplicated: Secondary | ICD-10-CM | POA: Diagnosis not present

## 2017-08-29 DIAGNOSIS — I25118 Atherosclerotic heart disease of native coronary artery with other forms of angina pectoris: Secondary | ICD-10-CM | POA: Diagnosis not present

## 2017-08-29 DIAGNOSIS — E782 Mixed hyperlipidemia: Secondary | ICD-10-CM | POA: Diagnosis not present

## 2017-08-29 DIAGNOSIS — R079 Chest pain, unspecified: Secondary | ICD-10-CM

## 2017-08-29 DIAGNOSIS — I2510 Atherosclerotic heart disease of native coronary artery without angina pectoris: Secondary | ICD-10-CM | POA: Insufficient documentation

## 2017-08-29 MED ORDER — EZETIMIBE 10 MG PO TABS: 10.0000 mg | ORAL_TABLET | Freq: Every day | ORAL | 3 refills | Status: DC

## 2017-08-29 NOTE — Patient Instructions (Addendum)
Medication Instructions:   Please start zetia one a day for cholesterol Continue on pravastatin  Labwork:  No new labs needed  Testing/Procedures:  No further testing at this time  Call when you would like a stress test ([place])  [place]  Your caregiver has ordered a Stress Test with nuclear imaging. The purpose of this test is to evaluate the blood supply to your heart muscle. This procedure is referred to as a "Non-Invasive Stress Test." This is because other than having an IV started in your vein, nothing is inserted or "invades" your body. Cardiac stress tests are done to find areas of poor blood flow to the heart by determining the extent of coronary artery disease (CAD). Some patients exercise on a treadmill, which naturally increases the blood flow to your heart, while others who are  unable to walk on a treadmill due to physical limitations have a pharmacologic/chemical stress agent called Lexiscan . This medicine will mimic walking on a treadmill by temporarily increasing your coronary blood flow.   Please note: these test may take anywhere between 2-4 hours to complete  PLEASE REPORT TO [place] AT THE FIRST DESK WILL DIRECT YOU WHERE TO GO  Date of Procedure:_____________________________________  Arrival Time for Procedure:______________________________    PLEASE NOTIFY THE OFFICE AT LEAST 24 HOURS IN ADVANCE IF YOU ARE UNABLE TO KEEP YOUR APPOINTMENT.  [phone] AND  PLEASE NOTIFY NUCLEAR MEDICINE AT [facility] AT LEAST 24 HOURS IN ADVANCE IF YOU ARE UNABLE TO KEEP YOUR APPOINTMENT. [phone]  How to prepare for your Myoview test:  1. Do not eat or drink after midnight 2. No caffeine for 24 hours prior to test 3. No smoking 24 hours prior to test. 4. Your medication may be taken with water.  If your doctor stopped a medication because of this test, do not take that medication. 5. Ladies, please do not wear dresses.  Skirts  or pants are appropriate. Please wear a short sleeve shirt. 6. No perfume, cologne or lotion. 7. Wear comfortable walking shoes. No heels!    Follow-Up: It was a pleasure seeing you in the office today. Please call us if you have new issues that need to be addressed before your next appt.  [phone]  Your physician wants you to follow-up in:  once a year  If you need a refill on your cardiac medications before your next appointment, please call your pharmacy.

## 2017-08-30 NOTE — Addendum Note (Signed)
Addended by: Britt Bottom on: 08/30/2017 02:01 PM   Modules accepted: Orders

## 2017-09-13 NOTE — Progress Notes (Signed)
Hematology/Oncology follow-up note [facility] Telephone:([phone] [phone]   Patient Care Team: Tracie Harrier, MD as PCP - General (Internal Medicine)  REFERRING PROVIDER: Tracie Harrier, MD  REASON FOR VISIT Follow up for treatment of diffuse large B cell lymphoma.   HISTORY OF PRESENTING ILLNESS:  John Gallegos is a  72 y.o.  male with PMH listed below presented for follow-up for treatment of diffuse large B cell lymphoma.  Pertinent history of oncology workup includes: Patient was found to have microscopic hematuria on 02/23/2017 with 4-10 RBC's/hpf.    Urine culture was negative.having symptoms of nocturia He is smoker. 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 He was in the [employer] and reports to be exposed to [place] in [place]. CT scan 04/20/2017 which revealed moderate left hydronephrosis and delayed excretion down to a large 4.4 cm mass in or around the left proximal ureter. There was also hazy soft tissue in the fat surrounding the celiac axis and superior mesenteric artery. Thickening of the right diaphragmatic crus. High gastrohepatic ligament lymphadenopathy measures up to 11 mm. Peripancreatic and root of small bowel mesentery adenopathy measures up to 2.6 cm.  He was seen by urologist Dr. Pilar Jarvis and he underwent cystoscopy, left retrograde pyelogram, left ureteroscopy and placement of left ureteral stent placement on 05/13/2017. Finding during the procedure that he has extrinsic compression of the ureter, possibly from lymphoma.  # Pathology:  Biopsy of paraspinal musculature at L2. Pathology results reviewed large B cell lymphoma. It is CD20 positive, CD10 positive. Flow cytometry shows a small clonal population of large B cells positive for CD10 and absent cop and lambda light chains. The abnormal cells represent 2% of the total cells.The morphology, initial IHC panel, and flow cytometry supports classification as  B-cell lymphoma, CD10 positive, with large cell features.diffuse large B-cell lymphoma, germinal center B-cell type. Discussed with pathologist Dr.Onley, Ki67 50%, MUM1 negative, MYC negative, Cycline D1 negative, CD30 negative, FISH is positive for BCL2 gene rearrangement. Negative for MYC and BCL6. 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   # Bone marrow Biopsy: negative for lymphoma involvement.  # PET scan 05/18/2017  IMPRESSION: 1. Multifocal metastatic disease within the LEFT retroperitoneum, central mesenteries, and RIGHT paraspinal musculature. 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 Recommend percutaneous biopsy of the RIGHT paraspinal musculature at L2. 2. 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 # Interim PET scan 07/28/2017 after 2 cycles of RCHOP 1. Marked improvement, with row solution of the prior bony lesions and significant reduction in activity of the central mesenteric and right gastric adenopathy. Central mesenteric nodal is currently Deauville 3, previously Deauville 4 and Deauville 5. 2. Improvement in the right paraspinal muscular activity, currently Deauville 3, previously Deauville 4. I also discussed with radiologist that there has been further improvement in the left periureteral density, likely reflecting treatment response.   # Prognostic Model for the risk of CNS lymphoma involvement per NCCN guideline,  Patient is intermediate risk with score of 2, one from being >60, and one from more than extranodal involvement>1 sites (bone, paraspinal muscle involvement). CNS prophylaxis is usually recommended if score 4-6. Therefore will not offer CNS prophylaxis for him.    # Treatment:  06/21/2017 Cycle 1 RCHOP [name]. Dexamethasone 35m on Day 1,  Prednisone 80 mg daily Day 2-5. 07/12/2017 Cycle 2 RCHOP /[name].Dexamethasone 264mon Day 1, Prednisone 80 mg daily Day 2-5 08/03/2017  Cycle 3  RCHOP/onpro.Dexamethasone '20mg'$  on Day 1, Prednisone 80 mg daily Day 2-5  08/24/2017 Cycle 4 RCHOP/onpro.Dexamethasone '20mg'$  on Day 1, Prednisone 80 mg daily Day 2-5  INTERVAL HISTORY [name] for evaluation of cycle 5 RCHOP chemotherapy. Patient reports feeling well.He has an upcoming ultrasound kidney to assessed post stent removal. He feels fatigue, at baseline. Intermittent lower extremity swelling. SOB only with extreme exertion.  Good appetite.   Review of Systems  Review of Systems  Constitutional: Positive for malaise/fatigue. Negative for fever and weight loss.  HENT: Negative for ear pain, hearing loss, nosebleeds and tinnitus.   Eyes: Negative for blurred vision, photophobia, pain and redness.  Respiratory: Negative for cough, hemoptysis, sputum production and wheezing.   Cardiovascular: Negative for chest pain, palpitations, orthopnea, leg swelling and PND.  Gastrointestinal: Negative for abdominal pain, constipation, heartburn, nausea and vomiting.  Genitourinary: Negative for dysuria, frequency and urgency.  Musculoskeletal: Negative for back pain, joint pain, myalgias and neck pain.       Chronic back pain Intermittent leg swelling.   Skin: Negative for itching and rash.  Neurological: Negative for dizziness, tingling, sensory change, focal weakness and headaches.  Endo/Heme/Allergies: Negative for environmental allergies. Does not bruise/bleed easily.  Psychiatric/Behavioral: Negative for depression, substance abuse and suicidal ideas. The patient is not nervous/anxious and does not have insomnia.    MEDICAL HISTORY:  Past Medical History:  Diagnosis Date  . Cancer ([place])    non hodgkin Bcell lymphoma  . Gout   . Hard of hearing   . Hypertension   . Urinary incontinence     SURGICAL HISTORY: Past Surgical History:  Procedure Laterality Date  . CYSTOSCOPY W/ RETROGRADES Left 08/19/2017   Procedure: CYSTOSCOPY WITH RETROGRADE PYELOGRAM;  Surgeon:  Abbie Sons, MD;  Location: [facility];  Service: Urology;  Laterality: Left;  . CYSTOSCOPY W/ URETERAL STENT REMOVAL Left 08/19/2017   Procedure: CYSTOSCOPY WITH STENT REMOVAL;  Surgeon: Abbie Sons, MD;  Location: [facility];  Service: Urology;  Laterality: Left;  . CYSTOSCOPY WITH BIOPSY Left 05/13/2017   Procedure: CYSTOSCOPY WITH URETERAL BIOPSY;  Surgeon: Nickie Retort, MD;  Location: [facility];  Service: Urology;  Laterality: Left;  . CYSTOSCOPY WITH STENT PLACEMENT Left 05/13/2017   Procedure: CYSTOSCOPY WITH STENT PLACEMENT;  Surgeon: Nickie Retort, MD;  Location: [facility];  Service: Urology;  Laterality: Left;  . PORTA CATH INSERTION N/A 06/14/2017   Procedure: PORTA CATH INSERTION;  Surgeon: Algernon Huxley, MD;  Location: [place] [facility];  Service: Cardiovascular;  Laterality: N/A;  . 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 right knne surgery     needle went through knee  . TONSILLECTOMY    . URETEROSCOPY Left 05/13/2017   Procedure: URETEROSCOPY;  Surgeon: Nickie Retort, MD;  Location: [facility];  Service: Urology;  Laterality: Left;    SOCIAL HISTORY: Social History   Socioeconomic History  . Marital status: Married    Spouse name: Not on file  . Number of children: Not on file  . Years of education: Not on file  . Highest education level: Not on file  Social Needs  . Financial resource strain: Not on file  . Food insecurity - worry: Not on file  . Food insecurity - inability: Not on file  . Transportation needs - medical: Not on file  . Transportation needs - non-medical: Not on file  Occupational History  . Not on file  Tobacco Use  .  Smoking status: Former Smoker    Packs/day: 0.50    Years: 50.00    Pack years: 25.00    Types: Cigarettes  . Smokeless tobacco: Never Used  Substance and Sexual Activity  . Alcohol use: Yes    Alcohol/week: 1.8 oz    Types: 3 Shots of liquor per week  . Drug use: No  . Sexual activity: Not on file  Other  Topics Concern  . Not on file  Social History Narrative  . Not on file    FAMILY HISTORY: Family History  Problem Relation Age of Onset  . Pancreatic cancer Mother   . Aneurysm Mother   . Heart attack Mother   . Lung cancer Father   . Lymphoma Sister   . Prostate cancer Neg Hx   . Kidney cancer Neg Hx   . Bladder Cancer Neg Hx     ALLERGIES:  has No Known Allergies.  MEDICATIONS:  Current Outpatient Medications  Medication Sig Dispense Refill  . acetaminophen (TYLENOL) 500 MG tablet Take 500 mg by mouth every 6 (six) hours as needed for mild pain or moderate pain.    Marland Kitchen amLODipine (NORVASC) 10 MG tablet Take 10 mg by mouth every evening.     Marland Kitchen aspirin EC 81 MG tablet Take 81 mg by mouth daily.    Marland Kitchen ezetimibe (ZETIA) 10 MG tablet Take 1 tablet (10 mg total) by mouth daily. 90 tablet 3  . hydrALAZINE (APRESOLINE) 25 MG tablet Take 25 mg by mouth every evening.     Marland Kitchen losartan (COZAAR) 100 MG tablet Take 100 mg by mouth every evening.    . pravastatin (PRAVACHOL) 80 MG tablet Take 1 tablet (80 mg total) by mouth every evening. 90 tablet 3  . zolpidem (AMBIEN) 10 MG tablet Take 10 mg by mouth at bedtime as needed for sleep.      No current facility-administered medications for this visit.    Facility-Administered Medications Ordered in Other Visits  Medication Dose Route Frequency Provider Last Rate Last Dose  . cyclophosphamide (CYTOXAN) 1,700 mg in sodium chloride 0.9 % 250 mL chemo infusion  750 mg/m2 (Treatment Plan Recorded) Intravenous Once Earlie Server, MD      . heparin lock flush 100 unit/mL  500 Units Intracatheter Once PRN Earlie Server, MD      . pegfilgrastim (NEULASTA ONPRO KIT) injection 6 mg  6 mg Subcutaneous Once Earlie Server, MD      . riTUXimab (RITUXAN) 800 mg in sodium chloride 0.9 % 170 mL chemo infusion  800 mg Intravenous Once Earlie Server, MD      . sodium chloride flush (NS) 0.9 % injection 10 mL  10 mL Intracatheter PRN Earlie Server, MD      . vinCRIStine (ONCOVIN) 2 mg in  sodium chloride 0.9 % 50 mL chemo infusion  2 mg Intravenous Once Earlie Server, MD   2 mg at 09/14/17 1021      .  PHYSICAL EXAMINATION: ECOG PERFORMANCE STATUS: 1 - Symptomatic but completely ambulatory Vitals:   09/14/17 0838  BP: (!) 156/89  Pulse: 60  Resp: 20  Temp: (!) 97.2 F (36.2 C)   Filed Weights   09/14/17 0838  Weight: 224 lb (101.6 kg)    Physical Exam  Constitutional: He is oriented to person, place, and time and well-developed, well-nourished, and in no distress. No distress.  HENT:  Head: Normocephalic and atraumatic.  Mouth/Throat: Oropharynx is clear and moist. No oropharyngeal exudate.  Eyes: Conjunctivae and  EOM are normal. Pupils are equal, round, and reactive to light. Right eye exhibits no discharge. No scleral icterus.  Neck: Normal range of motion. Neck supple. No JVD present.  Cardiovascular: Normal rate, regular rhythm and normal heart sounds. Exam reveals no gallop.  No murmur heard. Pulmonary/Chest: Effort normal and breath sounds normal. No respiratory distress. He has no rales. He exhibits no tenderness.  Abdominal: Soft. Bowel sounds are normal. There is no tenderness. There is no rebound.  Musculoskeletal: Normal range of motion. He exhibits no edema or deformity.  No lower extremity swelling.   Lymphadenopathy:    He has no cervical adenopathy.  Neurological: He is alert and oriented to person, place, and time. No cranial nerve deficit. Gait normal. Coordination normal.  Skin: Skin is warm and dry. No rash noted. No erythema.  Psychiatric: Affect and judgment normal.    LABORATORY DATA:  I have reviewed the data as listed Lab Results  Component Value Date   WBC 5.8 08/24/2017   HGB 10.4 (L) 08/24/2017   HCT 30.2 (L) 08/24/2017   MCV 88.9 08/24/2017   PLT 240 08/24/2017   Recent Labs    08/03/17 0842 08/17/17 1050 08/24/17 0900  NA 139 139 139  K 3.6 4.1 3.7  CL 107 105 107  CO2 '25 26 26  '$ GLUCOSE 116* 105* 98  BUN '19 14 15    '$ CREATININE 0.99 0.94 0.90  CALCIUM 8.8* 9.0 9.1  GFRNONAA >60 >60 >60  GFRAA >60 >60 >60  PROT 6.8 6.7 7.0  ALBUMIN 3.4* 3.6 3.6  AST 14* 15 16  ALT 10* 12* 12*  ALKPHOS 88 98 81  BILITOT 0.5 0.5 0.6   Hepatitis B antigen negative. 2-D echo.showed LVEF 60%   ASSESSMENT & PLAN:  This is a 72 y.o. male with stage IV diffuse large B-cell lymphoma, on first-line chemotherapy with R CHOP with curative intent, present for follow-up.  1. Diffuse large B-cell lymphoma of extranodal site excluding spleen and other solid organs ([place])   2. Encounter for antineoplastic chemotherapy   3. Anemia due to antineoplastic chemotherapy   4. Swelling of lower leg   # Okay to proceed cycle 5 R CHOP/onpro, planned total 6 cycles. We'll repeat PET scan a few weeks after 6 cycles of R CHOP. # Anemia likely secondary chemotherapy. Continue monitor. No need for transfusion today. # Fatigue secondary to chemotherapy. Continue monitor. # Intermittent lowe extremity swelling, no edema today in clinic on physical examination.  Repeat 2D Echo.  - The patient knows to call the clinic with any problems questions or concerns.  Return of visit: lab/MD/RCHOP/Onpro in 3 weeks   Earlie Server, MD, PhD Hematology Oncology [facility] at [facility] Pager- [phone] 09/14/17

## 2017-09-14 ENCOUNTER — Inpatient Hospital Stay: Payer: Medicare HMO

## 2017-09-14 ENCOUNTER — Inpatient Hospital Stay: Payer: Medicare HMO | Admitting: Oncology

## 2017-09-14 ENCOUNTER — Inpatient Hospital Stay: Payer: Medicare HMO | Attending: Oncology

## 2017-09-14 ENCOUNTER — Encounter: Payer: Self-pay | Admitting: Oncology

## 2017-09-14 VITALS — BP 156/89 | HR 60 | Temp 97.2°F | Resp 20 | Wt 224.0 lb

## 2017-09-14 DIAGNOSIS — Z5112 Encounter for antineoplastic immunotherapy: Secondary | ICD-10-CM | POA: Insufficient documentation

## 2017-09-14 DIAGNOSIS — T451X5A Adverse effect of antineoplastic and immunosuppressive drugs, initial encounter: Secondary | ICD-10-CM

## 2017-09-14 DIAGNOSIS — M7989 Other specified soft tissue disorders: Secondary | ICD-10-CM

## 2017-09-14 DIAGNOSIS — Z5189 Encounter for other specified aftercare: Secondary | ICD-10-CM | POA: Insufficient documentation

## 2017-09-14 DIAGNOSIS — Z5111 Encounter for antineoplastic chemotherapy: Secondary | ICD-10-CM

## 2017-09-14 DIAGNOSIS — R5383 Other fatigue: Secondary | ICD-10-CM | POA: Diagnosis not present

## 2017-09-14 DIAGNOSIS — D6481 Anemia due to antineoplastic chemotherapy: Secondary | ICD-10-CM | POA: Insufficient documentation

## 2017-09-14 DIAGNOSIS — C8339 Diffuse large B-cell lymphoma, extranodal and solid organ sites: Secondary | ICD-10-CM

## 2017-09-14 LAB — CBC WITH DIFFERENTIAL/PLATELET
BASOS ABS: 0.1 10*3/uL (ref 0–0.1)
BASOS PCT: 1 %
EOS PCT: 0 %
Eosinophils Absolute: 0 10*3/uL (ref 0–0.7)
HEMATOCRIT: 28.1 % — AB (ref 40.0–52.0)
Hemoglobin: 9.9 g/dL — ABNORMAL LOW (ref 13.0–18.0)
LYMPHS PCT: 6 %
Lymphs Abs: 0.4 10*3/uL — ABNORMAL LOW (ref 1.0–3.6)
MCH: 31.8 pg (ref 26.0–34.0)
MCHC: 35.3 g/dL (ref 32.0–36.0)
MCV: 90.1 fL (ref 80.0–100.0)
MONO ABS: 0.8 10*3/uL (ref 0.2–1.0)
MONOS PCT: 13 %
Neutro Abs: 5 10*3/uL (ref 1.4–6.5)
Neutrophils Relative %: 80 %
PLATELETS: 242 10*3/uL (ref 150–440)
RBC: 3.12 MIL/uL — ABNORMAL LOW (ref 4.40–5.90)
RDW: 17.6 % — AB (ref 11.5–14.5)
WBC: 6.2 10*3/uL (ref 3.8–10.6)

## 2017-09-14 LAB — COMPREHENSIVE METABOLIC PANEL
ALBUMIN: 3.7 g/dL (ref 3.5–5.0)
ALT: 18 U/L (ref 17–63)
AST: 23 U/L (ref 15–41)
Alkaline Phosphatase: 84 U/L (ref 38–126)
Anion gap: 9 (ref 5–15)
BILIRUBIN TOTAL: 0.5 mg/dL (ref 0.3–1.2)
BUN: 18 mg/dL (ref 6–20)
CALCIUM: 9 mg/dL (ref 8.9–10.3)
CO2: 25 mmol/L (ref 22–32)
Chloride: 108 mmol/L (ref 101–111)
Creatinine, Ser: 0.9 mg/dL (ref 0.61–1.24)
GFR calc Af Amer: >60 mL/min (ref >60)
GFR calc non Af Amer: >60 mL/min (ref >60)
GLUCOSE: 124 mg/dL — AB (ref 65–99)
Potassium: 3.7 mmol/L (ref 3.5–5.1)
SODIUM: 142 mmol/L (ref 135–145)
TOTAL PROTEIN: 6.8 g/dL (ref 6.5–8.1)

## 2017-09-14 MED ORDER — DOXORUBICIN HCL CHEMO IV INJECTION 2 MG/ML
50.0000 mg/m2 | Freq: Once | INTRAVENOUS | Status: AC
  Administered 2017-09-14: 114 mg via INTRAVENOUS
  Filled 2017-09-14: qty 57

## 2017-09-14 MED ORDER — SODIUM CHLORIDE 0.9 % IV SOLN
Freq: Once | INTRAVENOUS | Status: AC
  Administered 2017-09-14: 09:00:00 via INTRAVENOUS
  Filled 2017-09-14: qty 1000

## 2017-09-14 MED ORDER — SODIUM CHLORIDE 0.9% FLUSH
10.0000 mL | INTRAVENOUS | Status: DC | PRN
  Filled 2017-09-14: qty 10

## 2017-09-14 MED ORDER — SODIUM CHLORIDE 0.9 % IV SOLN: 375.0000 mg/m2 | Freq: Once | INTRAVENOUS | Status: DC

## 2017-09-14 MED ORDER — DIPHENHYDRAMINE HCL 25 MG PO CAPS
50.0000 mg | ORAL_CAPSULE | Freq: Once | ORAL | Status: AC
  Administered 2017-09-14: 50 mg via ORAL
  Filled 2017-09-14: qty 2

## 2017-09-14 MED ORDER — HEPARIN SOD (PORK) LOCK FLUSH 100 UNIT/ML IV SOLN
500.0000 [IU] | Freq: Once | INTRAVENOUS | Status: AC | PRN
  Administered 2017-09-14: 500 [IU]
  Filled 2017-09-14: qty 5

## 2017-09-14 MED ORDER — PALONOSETRON HCL INJECTION 0.25 MG/5ML
0.2500 mg | Freq: Once | INTRAVENOUS | Status: AC
  Administered 2017-09-14: 0.25 mg via INTRAVENOUS
  Filled 2017-09-14: qty 5

## 2017-09-14 MED ORDER — SODIUM CHLORIDE 0.9 % IV SOLN
800.0000 mg | Freq: Once | INTRAVENOUS | Status: AC
  Administered 2017-09-14: 800 mg via INTRAVENOUS
  Filled 2017-09-14: qty 50

## 2017-09-14 MED ORDER — PEGFILGRASTIM 6 MG/0.6ML ~~LOC~~ PSKT
6.0000 mg | PREFILLED_SYRINGE | Freq: Once | SUBCUTANEOUS | Status: AC
  Administered 2017-09-14: 6 mg via SUBCUTANEOUS
  Filled 2017-09-14: qty 0.6

## 2017-09-14 MED ORDER — ACETAMINOPHEN 325 MG PO TABS
650.0000 mg | ORAL_TABLET | Freq: Once | ORAL | Status: AC
  Administered 2017-09-14: 650 mg via ORAL
  Filled 2017-09-14: qty 2

## 2017-09-14 MED ORDER — SODIUM CHLORIDE 0.9 % IV SOLN
750.0000 mg/m2 | Freq: Once | INTRAVENOUS | Status: AC
  Administered 2017-09-14: 1700 mg via INTRAVENOUS
  Filled 2017-09-14: qty 85

## 2017-09-14 MED ORDER — SODIUM CHLORIDE 0.9 % IV SOLN
20.0000 mg | Freq: Once | INTRAVENOUS | Status: AC
  Administered 2017-09-14: 20 mg via INTRAVENOUS
  Filled 2017-09-14: qty 2

## 2017-09-14 MED ORDER — VINCRISTINE SULFATE CHEMO INJECTION 1 MG/ML
2.0000 mg | Freq: Once | INTRAVENOUS | Status: AC
  Administered 2017-09-14: 2 mg via INTRAVENOUS
  Filled 2017-09-14: qty 2

## 2017-09-16 ENCOUNTER — Ambulatory Visit
Admission: RE | Admit: 2017-09-16 | Discharge: 2017-09-16 | Disposition: A | Discharge status: Home / Self Care | Payer: Medicare HMO | Source: Ambulatory Visit | Attending: Urology | Admitting: Urology

## 2017-09-16 DIAGNOSIS — Z09 Encounter for follow-up examination after completed treatment for conditions other than malignant neoplasm: Secondary | ICD-10-CM | POA: Diagnosis not present

## 2017-09-16 DIAGNOSIS — N1330 Unspecified hydronephrosis: Secondary | ICD-10-CM

## 2017-09-16 DIAGNOSIS — N400 Enlarged prostate without lower urinary tract symptoms: Secondary | ICD-10-CM | POA: Diagnosis not present

## 2017-09-19 ENCOUNTER — Telehealth: Payer: Self-pay

## 2017-09-19 ENCOUNTER — Ambulatory Visit: Payer: Medicare HMO | Admitting: Urology

## 2017-09-19 ENCOUNTER — Encounter: Payer: Self-pay | Admitting: Urology

## 2017-09-19 VITALS — BP 163/85 | HR 61 | Ht 73.0 in | Wt 231.8 lb

## 2017-09-19 DIAGNOSIS — Z87448 Personal history of other diseases of urinary system: Secondary | ICD-10-CM | POA: Diagnosis not present

## 2017-09-19 NOTE — Telephone Encounter (Signed)
Letter sent.

## 2017-09-19 NOTE — Telephone Encounter (Signed)
-----   Message from Abbie Sons, MD sent at 09/16/2017  4:00 PM EST ----- Renal ultrasound showed no recurrent hydronephrosis.

## 2017-09-19 NOTE — Progress Notes (Signed)
09/19/2017 1:15 PM   [facility] [place] [birth year] [id number]  Referring provider: Tracie Harrier, MD [address] [facility] [place], [place] [address]  Chief complaint: Follow-up  HPI: 72 year old male with a history of left hydronephrosis secondary to extrinsic compression of diffuse large B-cell lymphoma.  He was initially treated with a ureteral stent placed on 05/13/2017.  Stent removal and left retrograde pyelogram was performed on 08/19/2017 which showed resolution of his hydronephrosis.  He had no postoperative problems and denies flank/abdominal pain.  He presents today for review of a one-month follow-up renal ultrasound.   PMH: Past Medical History:  Diagnosis Date  . Cancer ([place])    non hodgkin Bcell lymphoma  . Gout   . Hard of hearing   . Hypertension   . Urinary incontinence     Surgical History: Past Surgical History:  Procedure Laterality Date  . CYSTOSCOPY W/ RETROGRADES Left 08/19/2017   Procedure: CYSTOSCOPY WITH RETROGRADE PYELOGRAM;  Surgeon: Abbie Sons, MD;  Location: [facility];  Service: Urology;  Laterality: Left;  . CYSTOSCOPY W/ URETERAL STENT REMOVAL Left 08/19/2017   Procedure: CYSTOSCOPY WITH STENT REMOVAL;  Surgeon: Abbie Sons, MD;  Location: [facility];  Service: Urology;  Laterality: Left;  . CYSTOSCOPY WITH BIOPSY Left 05/13/2017   Procedure: CYSTOSCOPY WITH URETERAL BIOPSY;  Surgeon: Nickie Retort, MD;  Location: [facility];  Service: Urology;  Laterality: Left;  . CYSTOSCOPY WITH STENT PLACEMENT Left 05/13/2017   Procedure: CYSTOSCOPY WITH STENT PLACEMENT;  Surgeon: Nickie Retort, MD;  Location: [facility];  Service: Urology;  Laterality: Left;  . PORTA CATH INSERTION N/A 06/14/2017   Procedure: PORTA CATH INSERTION;  Surgeon: Algernon Huxley, MD;  Location: [place] [facility];  Service: Cardiovascular;  Laterality: N/A;  . pyleostenosis     60 weeks old  . right knne surgery     needle went through knee    . TONSILLECTOMY    . URETEROSCOPY Left 05/13/2017   Procedure: URETEROSCOPY;  Surgeon: Nickie Retort, MD;  Location: [facility];  Service: Urology;  Laterality: Left;    Home Medications:  Allergies as of 09/19/2017   No Known Allergies     Medication List        Accurate as of 09/19/17  1:15 PM. Always use your most recent med list.          acetaminophen 500 MG tablet Commonly known as:  TYLENOL Take 500 mg by mouth every 6 (six) hours as needed for mild pain or moderate pain.   amLODipine 10 MG tablet Commonly known as:  NORVASC Take 10 mg by mouth every evening.   aspirin EC 81 MG tablet Take 81 mg by mouth daily.   ezetimibe 10 MG tablet Commonly known as:  ZETIA Take 1 tablet (10 mg total) by mouth daily.   hydrALAZINE 25 MG tablet Commonly known as:  APRESOLINE Take 25 mg by mouth every evening.   losartan 100 MG tablet Commonly known as:  COZAAR Take 100 mg by mouth every evening.   pravastatin 80 MG tablet Commonly known as:  PRAVACHOL Take 1 tablet (80 mg total) by mouth every evening.   zolpidem 10 MG tablet Commonly known as:  AMBIEN Take 10 mg by mouth at bedtime as needed for sleep.       Allergies: No Known Allergies  Family History: Family History  Problem Relation Age of Onset  . Pancreatic cancer Mother   . Aneurysm Mother   .  Heart attack Mother   . Lung cancer Father   . Lymphoma Sister   . Prostate cancer Neg Hx   . Kidney cancer Neg Hx   . Bladder Cancer Neg Hx     Social History:  reports that he has quit smoking. His smoking use included cigarettes. He has a 25.00 pack-year smoking history. he has never used smokeless tobacco. He reports that he drinks about 1.8 oz of alcohol per week. He reports that he does not use drugs.  ROS: UROLOGY Frequent Urination?: No Hard to postpone urination?: No Burning/pain with urination?: No Get up at night to urinate?: No Leakage of urine?: No Urine stream starts and stops?:  No Trouble starting stream?: No Do you have to strain to urinate?: No Blood in urine?: No Urinary tract infection?: No Sexually transmitted disease?: No Injury to kidneys or bladder?: No Painful intercourse?: No Weak stream?: No Erection problems?: No Penile pain?: No  Gastrointestinal Nausea?: No Vomiting?: No Indigestion/heartburn?: No Diarrhea?: No Constipation?: No  Constitutional Fever: No Night sweats?: No Weight loss?: No Fatigue?: No  Skin Skin rash/lesions?: No Itching?: No  Eyes Blurred vision?: No Double vision?: No  Ears/Nose/Throat Sore throat?: No Sinus problems?: No  Hematologic/Lymphatic Swollen glands?: No Easy bruising?: No  Cardiovascular Leg swelling?: No Chest pain?: No  Respiratory Cough?: No Shortness of breath?: No  Endocrine Excessive thirst?: No  Musculoskeletal Back pain?: No Joint pain?: No  Neurological Headaches?: No Dizziness?: No  Psychologic Depression?: No Anxiety?: No  Physical Exam: BP (!) 163/85 (BP Location: Left Arm, Patient Position: Sitting, Cuff Size: Large)   Pulse 61   Ht 6\' 1"  (1.854 m)   Wt 231 lb 12.8 oz (105.1 kg)   BMI 30.58 kg/m   Constitutional:  Alert and oriented, No acute distress. Neurologic: Grossly intact, no focal deficits, moving all 4 extremities. Psychiatric: Normal mood and affect.  Laboratory Data: Lab Results  Component Value Date   WBC 6.2 09/14/2017   HGB 9.9 (L) 09/14/2017   HCT 28.1 (L) 09/14/2017   MCV 90.1 09/14/2017   PLT 242 09/14/2017    Lab Results  Component Value Date   CREATININE 0.90 09/14/2017     Pertinent Imaging:  Renal ultrasound performed on 09/16/2017 shows no hydronephrosis.  Results for orders placed during the hospital encounter of 09/16/17  Ultrasound renal complete   Narrative CLINICAL DATA:  Left ureteral stent, recently removed. History of left ureteral mass. Lymphoma.  EXAM: RENAL / URINARY TRACT ULTRASOUND  COMPLETE  COMPARISON:  07/28/2017  FINDINGS: Right Kidney:  Length: 11.6 cm. Echogenicity within normal limits. No mass or hydronephrosis visualized. Midportion abnormality question by the technologist looks most like a dromedary hump on previous CT studies.  Left Kidney:  Length: 12.2 cm. Echogenicity within normal limits. No mass or hydronephrosis visualized. Midportion echogenic area question by the technologist likely represents hilar fat.  Bladder:  Appears normal for degree of bladder distention. Prostate is mildly enlarged.  IMPRESSION: No hydronephrosis presently. Kidneys normal size without definable focal lesion.   Electronically Signed   By: Nelson Chimes M.D.   On: 09/16/2017 15:19      Assessment & Plan:   Left hydronephrosis secondary to diffuse B-cell lymphoma which has resolved with treatment.  Recent renal ultrasound shows no recurrent hydronephrosis.  Follow-up as needed.   Return if symptoms worsen or fail to improve.  Abbie Sons, [place] [address] [place], [place] [address] [phone]

## 2017-09-20 ENCOUNTER — Ambulatory Visit
Admission: RE | Admit: 2017-09-20 | Discharge: 2017-09-20 | Disposition: A | Discharge status: Home / Self Care | Payer: Medicare HMO | Source: Ambulatory Visit | Attending: Oncology | Admitting: Oncology

## 2017-09-20 DIAGNOSIS — Z9221 Personal history of antineoplastic chemotherapy: Secondary | ICD-10-CM | POA: Diagnosis not present

## 2017-09-20 DIAGNOSIS — I10 Essential (primary) hypertension: Secondary | ICD-10-CM | POA: Insufficient documentation

## 2017-09-20 DIAGNOSIS — T451X5A Adverse effect of antineoplastic and immunosuppressive drugs, initial encounter: Secondary | ICD-10-CM

## 2017-09-20 DIAGNOSIS — Z5111 Encounter for antineoplastic chemotherapy: Secondary | ICD-10-CM

## 2017-09-20 DIAGNOSIS — C8339 Diffuse large B-cell lymphoma, extranodal and solid organ sites: Secondary | ICD-10-CM | POA: Diagnosis not present

## 2017-09-20 DIAGNOSIS — D6481 Anemia due to antineoplastic chemotherapy: Secondary | ICD-10-CM | POA: Diagnosis not present

## 2017-09-20 NOTE — Progress Notes (Signed)
*  PRELIMINARY RESULTS* Echocardiogram 2D Echocardiogram has been performed.  John Gallegos 09/20/2017, 12:00 PM

## 2017-10-04 NOTE — Progress Notes (Signed)
Hematology/Oncology follow-up note [facility] [phone] [phone]   Patient Care Team: Tracie Harrier, MD as PCP - General (Internal Medicine)  REFERRING PROVIDER: Tracie Harrier, MD  REASON FOR VISIT Follow up for treatment of diffuse large B cell lymphoma.   HISTORY OF PRESENTING ILLNESS:  John Gallegos is a  72 y.o.  male with PMH listed below presented for follow-up for treatment of diffuse large B cell lymphoma.  Pertinent history of oncology workup includes: Patient was found to have microscopic hematuria on 02/23/2017 with 4-10 RBC's/hpf.    Urine culture was negative.having symptoms of nocturia He is smoker. 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 He was in the [employer] and reports to be exposed to [place] in [place]. CT scan 04/20/2017 which revealed moderate left hydronephrosis and delayed excretion down to a large 4.4 cm mass in or around the left proximal ureter. There was also hazy soft tissue in the fat surrounding the celiac axis and superior mesenteric artery. Thickening of the right diaphragmatic crus. High gastrohepatic ligament lymphadenopathy measures up to 11 mm. Peripancreatic and root of small bowel mesentery adenopathy measures up to 2.6 cm.  He was seen by urologist Dr. Pilar Jarvis and he underwent cystoscopy, left retrograde pyelogram, left ureteroscopy and placement of left ureteral stent placement on 05/13/2017. Finding during the procedure that he has extrinsic compression of the ureter, possibly from lymphoma.  # Pathology:  Biopsy of paraspinal musculature at L2. Pathology results reviewed large B cell lymphoma. It is CD20 positive, CD10 positive. Flow cytometry shows a small clonal population of large B cells positive for CD10 and absent cop and lambda light chains. The abnormal cells represent 2% of the total cells.The morphology, initial IHC panel, and flow cytometry supports classification as  B-cell lymphoma, CD10 positive, with large cell features.diffuse large B-cell lymphoma, germinal center B-cell type. Discussed with pathologist Dr.Onley, Ki67 50%, MUM1 negative, MYC negative, Cycline D1 negative, CD30 negative, FISH is positive for BCL2 gene rearrangement. Negative for MYC and BCL6. 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   # Bone marrow Biopsy: negative for lymphoma involvement.  # PET scan 05/18/2017  IMPRESSION: 1. Multifocal metastatic disease within the LEFT retroperitoneum, central mesenteries, and RIGHT paraspinal musculature. 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 Recommend percutaneous biopsy of the RIGHT paraspinal musculature at L2. 2. 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 # Interim PET scan 07/28/2017 after 2 cycles of RCHOP 1. Marked improvement, with row solution of the prior bony lesions and significant reduction in activity of the central mesenteric and right gastric adenopathy. Central mesenteric nodal is currently Deauville 3, previously Deauville 4 and Deauville 5. 2. Improvement in the right paraspinal muscular activity, currently Deauville 3, previously Deauville 4. I also discussed with radiologist that there has been further improvement in the left periureteral density, likely reflecting treatment response.   # Prognostic Model for the risk of CNS lymphoma involvement per NCCN guideline,  Patient is intermediate risk with score of 2, one from being >60, and one from more than extranodal involvement>1 sites (bone, paraspinal muscle involvement). CNS prophylaxis is usually recommended if score 4-6. Therefore will not offer CNS prophylaxis for him.    # Treatment:  06/21/2017 Cycle 1 RCHOP [name]. Dexamethasone 35m on Day 1,  Prednisone 80 mg daily Day 2-5. 07/12/2017 Cycle 2 RCHOP /[name].Dexamethasone 264mon Day 1, Prednisone 80 mg daily Day 2-5 08/03/2017  Cycle 3  RCHOP/onpro.Dexamethasone 53m on Day 1, Prednisone 80 mg daily Day 2-5  08/24/2017 Cycle 4 RCHOP/onpro.Dexamethasone 255mon Day 1, Prednisone 80 mg daily Day 2-5  INTERVAL HISTORY [name] for evaluation of cycle 5 RCHOP chemotherapy. Patient reports feeling well.Good appetite.  He reports having balancing problems occasionally, and needs to hold on to something to keep balanced.  Denies any dizziness, lightheadedness, spinning sensation. He has very mild numbness and tingling on his toes.  Swelling has resolved.  Review of Systems  Review of Systems  Constitutional: Positive for malaise/fatigue. Negative for fever and weight loss.  HENT: Negative for ear pain, hearing loss, nosebleeds and tinnitus.   Eyes: Negative for blurred vision, photophobia, pain and redness.  Respiratory: Negative for cough, hemoptysis, sputum production and wheezing.   Cardiovascular: Negative for chest pain, palpitations, orthopnea, leg swelling and PND.  Gastrointestinal: Negative for abdominal pain, blood in stool, constipation, diarrhea, heartburn, nausea and vomiting.  Genitourinary: Negative for dysuria, frequency and urgency.  Musculoskeletal: Negative for back pain, joint pain, myalgias and neck pain.       Chronic back pain Intermittent leg swelling.   Skin: Negative for itching and rash.  Neurological: Positive for tingling. Negative for dizziness, sensory change, focal weakness and headaches.  Endo/Heme/Allergies: Negative for environmental allergies. Does not bruise/bleed easily.  Psychiatric/Behavioral: Negative for depression, substance abuse and suicidal ideas. The patient is not nervous/anxious and does not have insomnia.    MEDICAL HISTORY:  Past Medical History:  Diagnosis Date  . Cancer ([facility]   non hodgkin Bcell lymphoma  . Gout   . Hard of hearing   . Hypertension   . Urinary incontinence     SURGICAL HISTORY: Past Surgical History:  Procedure Laterality Date  .  CYSTOSCOPY W/ RETROGRADES Left 08/19/2017   Procedure: CYSTOSCOPY WITH RETROGRADE PYELOGRAM;  Surgeon: StAbbie SonsMD;  Location: [facility];  Service: Urology;  Laterality: Left;  . CYSTOSCOPY W/ URETERAL STENT REMOVAL Left 08/19/2017   Procedure: CYSTOSCOPY WITH STENT REMOVAL;  Surgeon: StAbbie SonsMD;  Location: [facility];  Service: Urology;  Laterality: Left;  . CYSTOSCOPY WITH BIOPSY Left 05/13/2017   Procedure: CYSTOSCOPY WITH URETERAL BIOPSY;  Surgeon: BuNickie RetortMD;  Location: [facility];  Service: Urology;  Laterality: Left;  . CYSTOSCOPY WITH STENT PLACEMENT Left 05/13/2017   Procedure: CYSTOSCOPY WITH STENT PLACEMENT;  Surgeon: BuNickie RetortMD;  Location: [facility];  Service: Urology;  Laterality: Left;  . PORTA CATH INSERTION N/A 06/14/2017   Procedure: PORTA CATH INSERTION;  Surgeon: DeAlgernon HuxleyMD;  [facility];  Service: Cardiovascular;  Laterality: N/A;  . 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 right knne surgery     needle went through knee  . TONSILLECTOMY    . URETEROSCOPY Left 05/13/2017   Procedure: URETEROSCOPY;  Surgeon: BuNickie RetortMD;  Location: [facility];  Service: Urology;  Laterality: Left;    SOCIAL HISTORY: Social History   Socioeconomic History  . Marital status: Married    Spouse name: Not on file  . Number of children: Not on file  . Years of education: Not on file  . Highest education level: Not on file  Social Needs  . Financial resource strain: Not on file  . Food insecurity - worry: Not on file  . Food insecurity - inability: Not on file  . Transportation needs - medical: Not on file  . Transportation needs -  non-medical: Not on file  Occupational History  . Not on file  Tobacco Use  . Smoking status: Former Smoker    Packs/day: 0.50    Years: 50.00    Pack years: 25.00    Types: Cigarettes  . Smokeless tobacco: Never Used  Substance and Sexual Activity  . Alcohol use: Yes    Alcohol/week: 1.8  oz    Types: 3 Shots of liquor per week  . Drug use: No  . Sexual activity: Not on file  Other Topics Concern  . Not on file  Social History Narrative  . Not on file    FAMILY HISTORY: Family History  Problem Relation Age of Onset  . Pancreatic cancer Mother   . Aneurysm Mother   . Heart attack Mother   . Lung cancer Father   . Lymphoma Sister   . Prostate cancer Neg Hx   . Kidney cancer Neg Hx   . Bladder Cancer Neg Hx     ALLERGIES:  has No Known Allergies.  MEDICATIONS:  Current Outpatient Medications  Medication Sig Dispense Refill  . acetaminophen (TYLENOL) 500 MG tablet Take 500 mg by mouth every 6 (six) hours as needed for mild pain or moderate pain.    Marland Kitchen amLODipine (NORVASC) 10 MG tablet Take 10 mg by mouth every evening.     Marland Kitchen aspirin EC 81 MG tablet Take 81 mg by mouth daily.    Marland Kitchen ezetimibe (ZETIA) 10 MG tablet Take 1 tablet (10 mg total) by mouth daily. 90 tablet 3  . hydrALAZINE (APRESOLINE) 25 MG tablet Take 25 mg by mouth every evening.     Marland Kitchen losartan (COZAAR) 100 MG tablet Take 100 mg by mouth every evening.    . pravastatin (PRAVACHOL) 80 MG tablet Take 1 tablet (80 mg total) by mouth every evening. 90 tablet 3  . zolpidem (AMBIEN) 10 MG tablet Take 10 mg by mouth at bedtime as needed for sleep.      No current facility-administered medications for this visit.       Marland Kitchen  PHYSICAL EXAMINATION: ECOG PERFORMANCE STATUS: 1 - Symptomatic but completely ambulatory Vitals:   10/05/17 0837  BP: (!) 152/78  Pulse: 70  Temp: (!) 97.5 F (36.4 C)   Filed Weights   10/05/17 0837  Weight: 226 lb (102.5 kg)    Physical Exam  Constitutional: He is oriented to person, place, and time and well-developed, well-nourished, and in no distress. No distress.  HENT:  Head: Normocephalic and atraumatic.  Mouth/Throat: Oropharynx is clear and moist. No oropharyngeal exudate.  Eyes: Conjunctivae and EOM are normal. Pupils are equal, round, and reactive to light. Right  eye exhibits no discharge. No scleral icterus.  Neck: Normal range of motion. Neck supple. No JVD present.  Cardiovascular: Normal rate, regular rhythm and normal heart sounds. Exam reveals no gallop.  No murmur heard. Pulmonary/Chest: Effort normal and breath sounds normal. No respiratory distress. He has no rales. He exhibits no tenderness.  Abdominal: Soft. Bowel sounds are normal. There is no tenderness. There is no rebound.  Musculoskeletal: Normal range of motion. He exhibits no edema or deformity.  No lower extremity swelling.   Lymphadenopathy:    He has no cervical adenopathy.  Neurological: He is alert and oriented to person, place, and time. No cranial nerve deficit. Gait normal. Coordination normal.  Skin: Skin is warm and dry. No rash noted. No erythema.  Psychiatric: Affect normal.    LABORATORY DATA:  I have reviewed the  data as listed Lab Results  Component Value Date   WBC 6.2 09/14/2017   HGB 9.9 (L) 09/14/2017   HCT 28.1 (L) 09/14/2017   MCV 90.1 09/14/2017   PLT 242 09/14/2017   Recent Labs    08/17/17 1050 08/24/17 0900 09/14/17 0812  NA 139 139 142  K 4.1 3.7 3.7  CL 105 107 108  CO2 _0 GLUCOSE 105* 98 124*  BUN _1 CREATININE 0.94 0.90 0.90  CALCIUM 9.0 9.1 9.0  GFRNONAA >60 >60 >60  GFRAA >60 >60 >60  PROT 6.7 7.0 6.8  ALBUMIN 3.6 3.6 3.7  AST _2 ALT 12* 12* 18  ALKPHOS 98 81 84  BILITOT 0.5 0.6 0.5   Hepatitis B antigen negative. 2-D echo.showed LVEF 60%   ASSESSMENT & PLAN:  This is a 72 y.o. male with stage IV diffuse large B-cell lymphoma, on first-line chemotherapy with R CHOP with curative intent, present for follow-up.  1. Diffuse large B-cell lymphoma of extranodal site excluding spleen and other solid organs ([place])   2. Encounter for antineoplastic chemotherapy   3. Anemia due to antineoplastic chemotherapy   4. Balance problem   # Okay to proceed cycle 6 R CHOP/onpro, planned total 6 cycles. plan repeat PET  scan 5 weeks  after 6 cycles of R CHOP. # Anemia likely secondary chemotherapy. Continue monitor. No need for transfusion today. # Fatigue secondary to chemotherapy. Continue monitor. # Intermittent lowe extremity swelling, no edema today in clinic on physical examination. ECHO LVEF 50-55%.  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 Neurological examination non remarkable. Patient understands the risk of neuropathy get worse. He prefers to finish his last cycle of treatment.     - The patient knows to call the clinic with any problems questions or concerns.  Return of visit: lab/MD in 2 weeks   Earlie Server, MD, PhD Hematology [facility] Pager- [phone] 10/05/17

## 2017-10-05 ENCOUNTER — Encounter: Payer: Self-pay | Admitting: Oncology

## 2017-10-05 ENCOUNTER — Inpatient Hospital Stay (HOSPITAL_BASED_OUTPATIENT_CLINIC_OR_DEPARTMENT_OTHER): Payer: Medicare HMO | Admitting: Oncology

## 2017-10-05 ENCOUNTER — Inpatient Hospital Stay: Payer: Medicare HMO

## 2017-10-05 ENCOUNTER — Other Ambulatory Visit: Payer: Self-pay

## 2017-10-05 VITALS — BP 152/78 | HR 70 | Temp 97.5°F | Wt 226.0 lb

## 2017-10-05 DIAGNOSIS — R5383 Other fatigue: Secondary | ICD-10-CM | POA: Diagnosis not present

## 2017-10-05 DIAGNOSIS — C8339 Diffuse large B-cell lymphoma, extranodal and solid organ sites: Secondary | ICD-10-CM

## 2017-10-05 DIAGNOSIS — Z5111 Encounter for antineoplastic chemotherapy: Secondary | ICD-10-CM | POA: Diagnosis not present

## 2017-10-05 DIAGNOSIS — Z5189 Encounter for other specified aftercare: Secondary | ICD-10-CM | POA: Diagnosis not present

## 2017-10-05 DIAGNOSIS — T451X5A Adverse effect of antineoplastic and immunosuppressive drugs, initial encounter: Secondary | ICD-10-CM

## 2017-10-05 DIAGNOSIS — R2689 Other abnormalities of gait and mobility: Secondary | ICD-10-CM

## 2017-10-05 DIAGNOSIS — D6481 Anemia due to antineoplastic chemotherapy: Secondary | ICD-10-CM | POA: Diagnosis not present

## 2017-10-05 DIAGNOSIS — Z5112 Encounter for antineoplastic immunotherapy: Secondary | ICD-10-CM | POA: Diagnosis not present

## 2017-10-05 LAB — COMPREHENSIVE METABOLIC PANEL
ALT: 11 U/L — ABNORMAL LOW (ref 17–63)
AST: 17 U/L (ref 15–41)
Albumin: 3.5 g/dL (ref 3.5–5.0)
Alkaline Phosphatase: 94 U/L (ref 38–126)
Anion gap: 7 (ref 5–15)
BILIRUBIN TOTAL: 0.5 mg/dL (ref 0.3–1.2)
BUN: 18 mg/dL (ref 6–20)
CO2: 24 mmol/L (ref 22–32)
Calcium: 9.1 mg/dL (ref 8.9–10.3)
Chloride: 106 mmol/L (ref 101–111)
Creatinine, Ser: 0.93 mg/dL (ref 0.61–1.24)
Glucose, Bld: 125 mg/dL — ABNORMAL HIGH (ref 65–99)
POTASSIUM: 3.6 mmol/L (ref 3.5–5.1)
Sodium: 137 mmol/L (ref 135–145)
TOTAL PROTEIN: 6.9 g/dL (ref 6.5–8.1)

## 2017-10-05 LAB — CBC WITH DIFFERENTIAL/PLATELET
BASOS ABS: 0.1 10*3/uL (ref 0–0.1)
Basophils Relative: 1 %
EOS ABS: 0 10*3/uL (ref 0–0.7)
EOS PCT: 0 %
HCT: 26.8 % — ABNORMAL LOW (ref 40.0–52.0)
Hemoglobin: 9.2 g/dL — ABNORMAL LOW (ref 13.0–18.0)
Lymphocytes Relative: 6 %
Lymphs Abs: 0.3 10*3/uL — ABNORMAL LOW (ref 1.0–3.6)
MCH: 31.2 pg (ref 26.0–34.0)
MCHC: 34.5 g/dL (ref 32.0–36.0)
MCV: 90.4 fL (ref 80.0–100.0)
Monocytes Absolute: 0.8 10*3/uL (ref 0.2–1.0)
Monocytes Relative: 15 %
NEUTROS PCT: 78 %
Neutro Abs: 4.3 10*3/uL (ref 1.4–6.5)
PLATELETS: 281 10*3/uL (ref 150–440)
RBC: 2.96 MIL/uL — AB (ref 4.40–5.90)
RDW: 16.3 % — ABNORMAL HIGH (ref 11.5–14.5)
WBC: 5.5 10*3/uL (ref 3.8–10.6)

## 2017-10-05 MED ORDER — SODIUM CHLORIDE 0.9 % IV SOLN: 375.0000 mg/m2 | Freq: Once | INTRAVENOUS | Status: DC

## 2017-10-05 MED ORDER — SODIUM CHLORIDE 0.9 % IV SOLN
Freq: Once | INTRAVENOUS | Status: AC
  Administered 2017-10-05: 09:00:00 via INTRAVENOUS
  Filled 2017-10-05: qty 1000

## 2017-10-05 MED ORDER — PALONOSETRON HCL INJECTION 0.25 MG/5ML
0.2500 mg | Freq: Once | INTRAVENOUS | Status: AC
  Administered 2017-10-05: 0.25 mg via INTRAVENOUS
  Filled 2017-10-05: qty 5

## 2017-10-05 MED ORDER — ACETAMINOPHEN 325 MG PO TABS
650.0000 mg | ORAL_TABLET | Freq: Once | ORAL | Status: AC
  Administered 2017-10-05: 650 mg via ORAL
  Filled 2017-10-05: qty 2

## 2017-10-05 MED ORDER — DIPHENHYDRAMINE HCL 25 MG PO CAPS
50.0000 mg | ORAL_CAPSULE | Freq: Once | ORAL | Status: AC
  Administered 2017-10-05: 50 mg via ORAL
  Filled 2017-10-05: qty 2

## 2017-10-05 MED ORDER — RITUXIMAB CHEMO INJECTION 500 MG/50ML
800.0000 mg | Freq: Once | INTRAVENOUS | Status: AC
  Administered 2017-10-05: 800 mg via INTRAVENOUS
  Filled 2017-10-05: qty 50

## 2017-10-05 MED ORDER — PEGFILGRASTIM 6 MG/0.6ML ~~LOC~~ PSKT
6.0000 mg | PREFILLED_SYRINGE | Freq: Once | SUBCUTANEOUS | Status: AC
  Administered 2017-10-05: 6 mg via SUBCUTANEOUS
  Filled 2017-10-05: qty 0.6

## 2017-10-05 MED ORDER — SODIUM CHLORIDE 0.9 % IV SOLN
750.0000 mg/m2 | Freq: Once | INTRAVENOUS | Status: AC
  Administered 2017-10-05: 1700 mg via INTRAVENOUS
  Filled 2017-10-05: qty 85

## 2017-10-05 MED ORDER — HEPARIN SOD (PORK) LOCK FLUSH 100 UNIT/ML IV SOLN
500.0000 [IU] | Freq: Once | INTRAVENOUS | Status: AC | PRN
  Administered 2017-10-05: 500 [IU]

## 2017-10-05 MED ORDER — DEXAMETHASONE SODIUM PHOSPHATE 100 MG/10ML IJ SOLN
20.0000 mg | Freq: Once | INTRAMUSCULAR | Status: AC
  Administered 2017-10-05: 20 mg via INTRAVENOUS
  Filled 2017-10-05: qty 2

## 2017-10-05 MED ORDER — DOXORUBICIN HCL CHEMO IV INJECTION 2 MG/ML
50.0000 mg/m2 | Freq: Once | INTRAVENOUS | Status: AC
  Administered 2017-10-05: 114 mg via INTRAVENOUS
  Filled 2017-10-05: qty 57

## 2017-10-05 MED ORDER — VINCRISTINE SULFATE CHEMO INJECTION 1 MG/ML
2.0000 mg | Freq: Once | INTRAVENOUS | Status: AC
  Administered 2017-10-05: 2 mg via INTRAVENOUS
  Filled 2017-10-05: qty 2

## 2017-10-05 NOTE — Progress Notes (Signed)
Patient here today for follow up.  Patient c/o balance being "off" in the past 2 months.

## 2017-10-18 DIAGNOSIS — C8339 Diffuse large B-cell lymphoma, extranodal and solid organ sites: Secondary | ICD-10-CM | POA: Diagnosis not present

## 2017-10-18 DIAGNOSIS — I10 Essential (primary) hypertension: Secondary | ICD-10-CM | POA: Diagnosis not present

## 2017-10-18 DIAGNOSIS — J069 Acute upper respiratory infection, unspecified: Secondary | ICD-10-CM | POA: Diagnosis not present

## 2017-10-18 DIAGNOSIS — R600 Localized edema: Secondary | ICD-10-CM | POA: Diagnosis not present

## 2017-10-18 NOTE — Progress Notes (Signed)
Hematology/Oncology follow-up note [facility] Telephone:([phone] [phone]   Patient Care Team: Tracie Harrier, MD as PCP - General (Internal Medicine)  REFERRING PROVIDER: Tracie Harrier, MD  REASON FOR VISIT Follow up for treatment of diffuse large B cell lymphoma.   HISTORY OF PRESENTING ILLNESS:  John Gallegos is a  71 y.o.  male with PMH listed below presented for follow-up for treatment of diffuse large B cell lymphoma.  Pertinent history of oncology workup includes: Patient was found to have microscopic hematuria on 02/23/2017 with 4-10 RBC's/hpf.    Urine culture was negative.having symptoms of nocturia He is smoker. 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 He was in the [employer] and reports to be exposed to [place] in [place]. CT scan 04/20/2017 which revealed moderate left hydronephrosis and delayed excretion down to a large 4.4 cm mass in or around the left proximal ureter. There was also hazy soft tissue in the fat surrounding the celiac axis and superior mesenteric artery. Thickening of the right diaphragmatic crus. High gastrohepatic ligament lymphadenopathy measures up to 11 mm. Peripancreatic and root of small bowel mesentery adenopathy measures up to 2.6 cm.  He was seen by urologist Dr. Pilar Jarvis and he underwent cystoscopy, left retrograde pyelogram, left ureteroscopy and placement of left ureteral stent placement on 05/13/2017. Finding during the procedure that he has extrinsic compression of the ureter, possibly from lymphoma.  # Pathology:  Biopsy of paraspinal musculature at L2. Pathology results reviewed large B cell lymphoma. It is CD20 positive, CD10 positive. Flow cytometry shows a small clonal population of large B cells positive for CD10 and absent cop and lambda light chains. The abnormal cells represent 2% of the total cells.The morphology, initial IHC panel, and flow cytometry supports classification as  B-cell lymphoma, CD10 positive, with large cell features.diffuse large B-cell lymphoma, germinal center B-cell type. Discussed with pathologist Dr.Onley, Ki67 50%, MUM1 negative, MYC negative, Cycline D1 negative, CD30 negative, FISH is positive for BCL2 gene rearrangement. Negative for MYC and BCL6. 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   # Bone marrow Biopsy: negative for lymphoma involvement.  # PET scan 05/18/2017  IMPRESSION: 1. Multifocal metastatic disease within the LEFT retroperitoneum, central mesenteries, and RIGHT paraspinal musculature. 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 Recommend percutaneous biopsy of the RIGHT paraspinal musculature at L2. 2. 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 # Interim PET scan 07/28/2017 after 2 cycles of RCHOP 1. Marked improvement, with row solution of the prior bony lesions and significant reduction in activity of the central mesenteric and right gastric adenopathy. Central mesenteric nodal is currently Deauville 3, previously Deauville 4 and Deauville 5. 2. Improvement in the right paraspinal muscular activity, currently Deauville 3, previously Deauville 4. I also discussed with radiologist that there has been further improvement in the left periureteral density, likely reflecting treatment response.   # Prognostic Model for the risk of CNS lymphoma involvement per NCCN guideline,  Patient is intermediate risk with score of 2, one from being >60, and one from more than extranodal involvement>1 sites (bone, paraspinal muscle involvement). CNS prophylaxis is usually recommended if score 4-6.  CNS prophylaxis was not offered.    # Treatment:  06/21/2017 Cycle 1 RCHOP [name]. Dexamethasone 40m on Day 1,  Prednisone 80 mg daily Day 2-5. 07/12/2017 Cycle 2 RCHOP /[name].Dexamethasone 214mon Day 1, Prednisone 80 mg daily Day 2-5 08/03/2017 Cycle 3  RCHOP/onpro.Dexamethasone  '20mg'$  on Day 1, Prednisone 80 mg daily Day 2-5  08/24/2017 Cycle 4 RCHOP/onpro.Dexamethasone '20mg'$  on Day 1, Prednisone 80 mg daily Day 2-5  09/14/2017 Cycle 5 RCHOP/onpro.Dexamethasone '20mg'$  on Day 1, Prednisone 80 mg daily Day 2-5  10/05/2017 Cycle 6 RCHOP/onpro.Dexamethasone '20mg'$  on Day 1, Prednisone 80 mg daily Day 2-5  INTERVAL HISTORY [name] for evaluation of after 6 cycles of RCHOP.   Patient reports feeling well.Good appetite.  He reports fatigue, and having "balancing problems". He reports having no problems walking, however, after walking certain distance, he has feels balance  occasionally, and needs to hold on to something as he is getting tired. Denies dizziness, lightheaded, sensation of room spinning. He has very mild numbness and tingling on his toes.  He has seen Dr.Hande recently for nasal congestion and he has sinus infection. He is currently on a course of Ceftin '500mg'$  BID. No fever, chills, night sweats.   Review of Systems  Review of Systems  Constitutional: Positive for malaise/fatigue. Negative for fever and weight loss.  HENT: Negative for congestion, ear pain, hearing loss, nosebleeds and tinnitus.   Eyes: Negative for blurred vision, photophobia, pain, discharge and redness.  Respiratory: Negative for cough, hemoptysis, sputum production and wheezing.   Cardiovascular: Negative for chest pain, palpitations, orthopnea, leg swelling and PND.  Gastrointestinal: Negative for abdominal pain, blood in stool, constipation, diarrhea, heartburn, nausea and vomiting.  Genitourinary: Negative for dysuria, frequency and urgency.  Musculoskeletal: Negative for back pain, joint pain, myalgias and neck pain.       Chronic back pain Intermittent leg swelling.   Skin: Negative for itching and rash.  Neurological: Positive for tingling. Negative for dizziness, sensory change, focal weakness, seizures and headaches.  Endo/Heme/Allergies: Negative for environmental allergies.  Does not bruise/bleed easily.  Psychiatric/Behavioral: Negative for depression, substance abuse and suicidal ideas. The patient is not nervous/anxious and does not have insomnia.    MEDICAL HISTORY:  Past Medical History:  Diagnosis Date  . Cancer ([place])    non hodgkin Bcell lymphoma  . Gout   . Hard of hearing   . Hypertension   . Urinary incontinence     SURGICAL HISTORY: Past Surgical History:  Procedure Laterality Date  . CYSTOSCOPY W/ RETROGRADES Left 08/19/2017   Procedure: CYSTOSCOPY WITH RETROGRADE PYELOGRAM;  Surgeon: Abbie Sons, MD;  Location: [facility];  Service: Urology;  Laterality: Left;  . CYSTOSCOPY W/ URETERAL STENT REMOVAL Left 08/19/2017   Procedure: CYSTOSCOPY WITH STENT REMOVAL;  Surgeon: Abbie Sons, MD;  Location: [facility];  Service: Urology;  Laterality: Left;  . CYSTOSCOPY WITH BIOPSY Left 05/13/2017   Procedure: CYSTOSCOPY WITH URETERAL BIOPSY;  Surgeon: Nickie Retort, MD;  Location: [facility];  Service: Urology;  Laterality: Left;  . CYSTOSCOPY WITH STENT PLACEMENT Left 05/13/2017   Procedure: CYSTOSCOPY WITH STENT PLACEMENT;  Surgeon: Nickie Retort, MD;  Location: [facility];  Service: Urology;  Laterality: Left;  . PORTA CATH INSERTION N/A 06/14/2017   Procedure: PORTA CATH INSERTION;  Surgeon: Algernon Huxley, MD;  Location: [facility];  Service: Cardiovascular;  Laterality: N/A;  . 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 right knne surgery     needle went through knee  . TONSILLECTOMY    . URETEROSCOPY Left 05/13/2017   Procedure: URETEROSCOPY;  Surgeon: Nickie Retort, MD;  Location: [facility];  Service: Urology;  Laterality: Left;    SOCIAL HISTORY: Social History   Socioeconomic  History  . Marital status: Married    Spouse name: Not on file  . Number of children: Not on file  . Years of education: Not on file  . Highest education level: Not on file  Social Needs  . Financial resource strain: Not on file  . Food  insecurity - worry: Not on file  . Food insecurity - inability: Not on file  . Transportation needs - medical: Not on file  . Transportation needs - non-medical: Not on file  Occupational History  . Not on file  Tobacco Use  . Smoking status: Former Smoker    Packs/day: 0.50    Years: 50.00    Pack years: 25.00    Types: Cigarettes  . Smokeless tobacco: Never Used  Substance and Sexual Activity  . Alcohol use: Yes    Alcohol/week: 1.8 oz    Types: 3 Shots of liquor per week  . Drug use: No  . Sexual activity: Not on file  Other Topics Concern  . Not on file  Social History Narrative  . Not on file    FAMILY HISTORY: Family History  Problem Relation Age of Onset  . Pancreatic cancer Mother   . Aneurysm Mother   . Heart attack Mother   . Lung cancer Father   . Lymphoma Sister   . Prostate cancer Neg Hx   . Kidney cancer Neg Hx   . Bladder Cancer Neg Hx     ALLERGIES:  has No Known Allergies.  MEDICATIONS:  Current Outpatient Medications  Medication Sig Dispense Refill  . acetaminophen (TYLENOL) 500 MG tablet Take 500 mg by mouth every 6 (six) hours as needed for mild pain or moderate pain.    Marland Kitchen amLODipine (NORVASC) 10 MG tablet Take 10 mg by mouth every evening.     Marland Kitchen aspirin EC 81 MG tablet Take 81 mg by mouth daily.    Marland Kitchen ezetimibe (ZETIA) 10 MG tablet Take 1 tablet (10 mg total) by mouth daily. 90 tablet 3  . hydrALAZINE (APRESOLINE) 25 MG tablet Take 25 mg by mouth every evening.     Marland Kitchen losartan (COZAAR) 100 MG tablet Take 100 mg by mouth every evening.    . pravastatin (PRAVACHOL) 80 MG tablet Take 1 tablet (80 mg total) by mouth every evening. 90 tablet 3  . zolpidem (AMBIEN) 10 MG tablet Take 10 mg by mouth at bedtime as needed for sleep.      No current facility-administered medications for this visit.       Marland Kitchen  PHYSICAL EXAMINATION: ECOG PERFORMANCE STATUS: 1 - Symptomatic but completely ambulatory Vitals:   10/19/17 0940  BP: (!) 149/81  Pulse: 60    Resp: 18  Temp: 97.9 F (36.6 C)   Filed Weights   10/19/17 0936  Weight: 225 lb 4.8 oz (102.2 kg)    Physical Exam  Constitutional: He is oriented to person, place, and time and well-developed, well-nourished, and in no distress. No distress.  HENT:  Head: Normocephalic and atraumatic.  Mouth/Throat: Oropharynx is clear and moist. No oropharyngeal exudate.  Eyes: Conjunctivae and EOM are normal. Pupils are equal, round, and reactive to light. Right eye exhibits no discharge. No scleral icterus.  Neck: Normal range of motion. Neck supple. No JVD present.  Cardiovascular: Normal rate, regular rhythm and normal heart sounds. Exam reveals no gallop.  No murmur heard. Pulmonary/Chest: Effort normal and breath sounds normal. No respiratory distress. He has no rales. He exhibits no tenderness.  Abdominal: Soft.  Bowel sounds are normal. There is no tenderness. There is no rebound.  Musculoskeletal: Normal range of motion. He exhibits no deformity.  trace lower extremity swelling.   Lymphadenopathy:    He has no cervical adenopathy.  Neurological: He is alert and oriented to person, place, and time. No cranial nerve deficit. Gait normal. Coordination normal.  Skin: Skin is warm and dry. No rash noted. No erythema.  Psychiatric: Affect and judgment normal.    LABORATORY DATA:  I have reviewed the data as listed Lab Results  Component Value Date   WBC 6.6 10/19/2017   HGB 8.9 (L) 10/19/2017   HCT 25.6 (L) 10/19/2017   MCV 90.2 10/19/2017   PLT 117 (L) 10/19/2017   Recent Labs    08/24/17 0900 09/14/17 0812 10/05/17 0810  NA 139 142 137  K 3.7 3.7 3.6  CL 107 108 106  CO2 '26 25 24  '$ GLUCOSE 98 124* 125*  BUN '15 18 18  '$ CREATININE 0.90 0.90 0.93  CALCIUM 9.1 9.0 9.1  GFRNONAA >60 >60 >60  GFRAA >60 >60 >60  PROT 7.0 6.8 6.9  ALBUMIN 3.6 3.7 3.5  AST '16 23 17  '$ ALT 12* 18 11*  ALKPHOS 81 84 94  BILITOT 0.6 0.5 0.5   Hepatitis B antigen negative. 2-D echo.showed LVEF  60%   ASSESSMENT & PLAN:  This is a 72 y.o. male with stage IV diffuse large B-cell lymphoma, s/p first-line chemotherapy with R CHOP with curative intent, present for follow-up.  1. Diffuse large B-cell lymphoma of extranodal site excluding spleen and other solid organs ([place])   2. Fatigue associated with anemia   3. Unsteady gait   4. Anemia, unspecified type   # s/p 6 cycles of RCHOP # Anemia likely secondary chemotherapy, maybe combination of recent sinus infection. Repeat CBC in 1 week. Will also check folate, B12, iron panel, LDH. Continue monitor. No need for transfusion today.  # Fatigue secondary to chemotherapy/Anemia. Continue monitor. # ?Balance problem: patient has no neurological deficit, focal weakness, his gait is normal, his "balancing problem" comes after he walks a certain distance and due to fatigue. ? neuropathy secondary to vincristine. Reviewed his previous Brain images, he had non remarkable Brain MRI in July 2018 due to dizziness/balance problem,  There was Mild chronic appearing small vessel change of the pons and cerebral hemispheric white matter. In 2015, he had CT head done due to same reason. I wonder if this has been a chronic issure.  I will obtain another MRI brain to rule out any intracranial etiology. If non conclusive, will refer to neurology.   PET scan in 3 weeks.  - The patient knows to call the clinic with any problems questions or concerns.  Return of visit: lab/MD in 3 weeks   Earlie Server, MD, PhD Hematology Oncology [facility] Pager- [phone] 10/19/17

## 2017-10-19 ENCOUNTER — Encounter: Payer: Self-pay | Admitting: Oncology

## 2017-10-19 ENCOUNTER — Inpatient Hospital Stay: Payer: Medicare HMO

## 2017-10-19 ENCOUNTER — Inpatient Hospital Stay: Payer: Medicare HMO | Attending: Oncology | Admitting: Oncology

## 2017-10-19 VITALS — BP 149/81 | HR 60 | Temp 97.9°F | Resp 18 | Ht 73.0 in | Wt 225.3 lb

## 2017-10-19 DIAGNOSIS — C8339 Diffuse large B-cell lymphoma, extranodal and solid organ sites: Secondary | ICD-10-CM | POA: Insufficient documentation

## 2017-10-19 DIAGNOSIS — R2681 Unsteadiness on feet: Secondary | ICD-10-CM

## 2017-10-19 DIAGNOSIS — G3281 Cerebellar ataxia in diseases classified elsewhere: Secondary | ICD-10-CM

## 2017-10-19 DIAGNOSIS — D649 Anemia, unspecified: Secondary | ICD-10-CM | POA: Diagnosis not present

## 2017-10-19 DIAGNOSIS — R5383 Other fatigue: Secondary | ICD-10-CM | POA: Diagnosis not present

## 2017-10-19 LAB — CBC WITH DIFFERENTIAL/PLATELET
BASOS ABS: 0.1 10*3/uL (ref 0–0.1)
Basophils Relative: 1 %
EOS ABS: 0 10*3/uL (ref 0–0.7)
EOS PCT: 0 %
HCT: 25.6 % — ABNORMAL LOW (ref 40.0–52.0)
HEMOGLOBIN: 8.9 g/dL — AB (ref 13.0–18.0)
LYMPHS PCT: 4 %
Lymphs Abs: 0.3 10*3/uL — ABNORMAL LOW (ref 1.0–3.6)
MCH: 31.4 pg (ref 26.0–34.0)
MCHC: 34.7 g/dL (ref 32.0–36.0)
MCV: 90.2 fL (ref 80.0–100.0)
Monocytes Absolute: 0.8 10*3/uL (ref 0.2–1.0)
Monocytes Relative: 12 %
NEUTROS PCT: 83 %
Neutro Abs: 5.4 10*3/uL (ref 1.4–6.5)
PLATELETS: 117 10*3/uL — AB (ref 150–440)
RBC: 2.84 MIL/uL — AB (ref 4.40–5.90)
RDW: 15.6 % — ABNORMAL HIGH (ref 11.5–14.5)
WBC: 6.6 10*3/uL (ref 3.8–10.6)

## 2017-10-19 LAB — COMPREHENSIVE METABOLIC PANEL
ALK PHOS: 97 U/L (ref 38–126)
ALT: 10 U/L — AB (ref 17–63)
AST: 13 U/L — AB (ref 15–41)
Albumin: 3.3 g/dL — ABNORMAL LOW (ref 3.5–5.0)
Anion gap: 9 (ref 5–15)
BUN: 12 mg/dL (ref 6–20)
CHLORIDE: 108 mmol/L (ref 101–111)
CO2: 24 mmol/L (ref 22–32)
CREATININE: 0.83 mg/dL (ref 0.61–1.24)
Calcium: 8.8 mg/dL — ABNORMAL LOW (ref 8.9–10.3)
GFR calc Af Amer: >60 mL/min (ref >60)
GFR calc non Af Amer: >60 mL/min (ref >60)
GLUCOSE: 111 mg/dL — AB (ref 65–99)
Potassium: 3.9 mmol/L (ref 3.5–5.1)
SODIUM: 141 mmol/L (ref 135–145)
Total Bilirubin: 0.4 mg/dL (ref 0.3–1.2)
Total Protein: 6.4 g/dL — ABNORMAL LOW (ref 6.5–8.1)

## 2017-10-19 NOTE — Addendum Note (Signed)
Addended by: Vernetta Honey on: 10/19/2017 03:15 PM   Modules accepted: Orders

## 2017-10-19 NOTE — Progress Notes (Signed)
Pt has cold sx and saw pcp and was put on ceftin. No c/o

## 2017-10-27 ENCOUNTER — Inpatient Hospital Stay: Payer: Medicare HMO

## 2017-10-27 ENCOUNTER — Ambulatory Visit
Admission: RE | Admit: 2017-10-27 | Discharge: 2017-10-27 | Disposition: A | Discharge status: Home / Self Care | Payer: Medicare HMO | Source: Ambulatory Visit | Attending: Oncology | Admitting: Oncology

## 2017-10-27 DIAGNOSIS — I6782 Cerebral ischemia: Secondary | ICD-10-CM | POA: Diagnosis not present

## 2017-10-27 DIAGNOSIS — D649 Anemia, unspecified: Secondary | ICD-10-CM | POA: Diagnosis not present

## 2017-10-27 DIAGNOSIS — J320 Chronic maxillary sinusitis: Secondary | ICD-10-CM | POA: Diagnosis not present

## 2017-10-27 DIAGNOSIS — R2681 Unsteadiness on feet: Secondary | ICD-10-CM | POA: Diagnosis not present

## 2017-10-27 DIAGNOSIS — C8339 Diffuse large B-cell lymphoma, extranodal and solid organ sites: Secondary | ICD-10-CM | POA: Diagnosis not present

## 2017-10-27 DIAGNOSIS — R270 Ataxia, unspecified: Secondary | ICD-10-CM | POA: Diagnosis not present

## 2017-10-27 DIAGNOSIS — R5383 Other fatigue: Secondary | ICD-10-CM | POA: Diagnosis not present

## 2017-10-27 DIAGNOSIS — M2548 Effusion, other site: Secondary | ICD-10-CM | POA: Diagnosis not present

## 2017-10-27 LAB — CBC WITH DIFFERENTIAL/PLATELET
BASOS PCT: 1 %
Basophils Absolute: 0.1 10*3/uL (ref 0–0.1)
Eosinophils Absolute: 0 10*3/uL (ref 0–0.7)
Eosinophils Relative: 0 %
HEMATOCRIT: 28.1 % — AB (ref 40.0–52.0)
HEMOGLOBIN: 9.7 g/dL — AB (ref 13.0–18.0)
Lymphocytes Relative: 5 %
Lymphs Abs: 0.3 10*3/uL — ABNORMAL LOW (ref 1.0–3.6)
MCH: 31 pg (ref 26.0–34.0)
MCHC: 34.5 g/dL (ref 32.0–36.0)
MCV: 89.8 fL (ref 80.0–100.0)
MONOS PCT: 13 %
Monocytes Absolute: 0.7 10*3/uL (ref 0.2–1.0)
NEUTROS ABS: 4.1 10*3/uL (ref 1.4–6.5)
NEUTROS PCT: 81 %
Platelets: 261 10*3/uL (ref 150–440)
RBC: 3.13 MIL/uL — AB (ref 4.40–5.90)
RDW: 15.9 % — ABNORMAL HIGH (ref 11.5–14.5)
WBC: 5.1 10*3/uL (ref 3.8–10.6)

## 2017-10-27 LAB — IRON AND TIBC
IRON: 47 ug/dL (ref 45–182)
Saturation Ratios: 18 % (ref 17.9–39.5)
TIBC: 256 ug/dL (ref 250–450)
UIBC: 209 ug/dL

## 2017-10-27 LAB — VITAMIN B12: Vitamin B-12: 704 pg/mL (ref 180–914)

## 2017-10-27 LAB — FERRITIN: FERRITIN: 229 ng/mL (ref 24–336)

## 2017-10-27 LAB — FOLATE: FOLATE: 9 ng/mL (ref >5.9)

## 2017-10-27 MED ORDER — GADOBENATE DIMEGLUMINE 529 MG/ML IV SOLN
20.0000 mL | Freq: Once | INTRAVENOUS | Status: AC | PRN
  Administered 2017-10-27: 20 mL via INTRAVENOUS

## 2017-11-03 DIAGNOSIS — E7800 Pure hypercholesterolemia, unspecified: Secondary | ICD-10-CM | POA: Diagnosis not present

## 2017-11-03 DIAGNOSIS — D649 Anemia, unspecified: Secondary | ICD-10-CM | POA: Diagnosis not present

## 2017-11-03 DIAGNOSIS — E8809 Other disorders of plasma-protein metabolism, not elsewhere classified: Secondary | ICD-10-CM | POA: Diagnosis not present

## 2017-11-03 DIAGNOSIS — Z0000 Encounter for general adult medical examination without abnormal findings: Secondary | ICD-10-CM | POA: Diagnosis not present

## 2017-11-03 DIAGNOSIS — I10 Essential (primary) hypertension: Secondary | ICD-10-CM | POA: Diagnosis not present

## 2017-11-03 DIAGNOSIS — C8339 Diffuse large B-cell lymphoma, extranodal and solid organ sites: Secondary | ICD-10-CM | POA: Diagnosis not present

## 2017-11-03 DIAGNOSIS — R739 Hyperglycemia, unspecified: Secondary | ICD-10-CM | POA: Diagnosis not present

## 2017-11-03 DIAGNOSIS — Z87891 Personal history of nicotine dependence: Secondary | ICD-10-CM | POA: Diagnosis not present

## 2017-11-07 ENCOUNTER — Ambulatory Visit
Admission: RE | Admit: 2017-11-07 | Discharge: 2017-11-07 | Disposition: A | Discharge status: Home / Self Care | Payer: Medicare HMO | Source: Ambulatory Visit | Attending: Oncology | Admitting: Oncology

## 2017-11-07 DIAGNOSIS — C8339 Diffuse large B-cell lymphoma, extranodal and solid organ sites: Secondary | ICD-10-CM | POA: Insufficient documentation

## 2017-11-07 DIAGNOSIS — Z79899 Other long term (current) drug therapy: Secondary | ICD-10-CM | POA: Insufficient documentation

## 2017-11-07 DIAGNOSIS — Z7982 Long term (current) use of aspirin: Secondary | ICD-10-CM | POA: Insufficient documentation

## 2017-11-07 DIAGNOSIS — C8510 Unspecified B-cell lymphoma, unspecified site: Secondary | ICD-10-CM | POA: Diagnosis not present

## 2017-11-07 DIAGNOSIS — D649 Anemia, unspecified: Secondary | ICD-10-CM | POA: Insufficient documentation

## 2017-11-07 DIAGNOSIS — R5383 Other fatigue: Secondary | ICD-10-CM | POA: Insufficient documentation

## 2017-11-07 LAB — GLUCOSE, CAPILLARY: Glucose-Capillary: 101 mg/dL — ABNORMAL HIGH (ref 65–99)

## 2017-11-07 MED ORDER — FLUDEOXYGLUCOSE F - 18 (FDG) INJECTION
11.7100 | Freq: Once | INTRAVENOUS | Status: AC | PRN
  Administered 2017-11-07: 11.71 via INTRAVENOUS

## 2017-11-09 ENCOUNTER — Encounter: Payer: Self-pay | Admitting: Oncology

## 2017-11-09 ENCOUNTER — Inpatient Hospital Stay: Payer: Medicare HMO | Attending: Oncology | Admitting: Oncology

## 2017-11-09 ENCOUNTER — Inpatient Hospital Stay: Payer: Medicare HMO

## 2017-11-09 VITALS — BP 169/90 | HR 60 | Temp 97.2°F | Resp 18 | Wt 225.0 lb

## 2017-11-09 DIAGNOSIS — C8339 Diffuse large B-cell lymphoma, extranodal and solid organ sites: Secondary | ICD-10-CM | POA: Insufficient documentation

## 2017-11-09 DIAGNOSIS — T451X5A Adverse effect of antineoplastic and immunosuppressive drugs, initial encounter: Secondary | ICD-10-CM

## 2017-11-09 DIAGNOSIS — R079 Chest pain, unspecified: Secondary | ICD-10-CM

## 2017-11-09 DIAGNOSIS — Z95828 Presence of other vascular implants and grafts: Secondary | ICD-10-CM

## 2017-11-09 DIAGNOSIS — G629 Polyneuropathy, unspecified: Secondary | ICD-10-CM | POA: Diagnosis not present

## 2017-11-09 DIAGNOSIS — Z9221 Personal history of antineoplastic chemotherapy: Secondary | ICD-10-CM | POA: Diagnosis not present

## 2017-11-09 DIAGNOSIS — R2689 Other abnormalities of gait and mobility: Secondary | ICD-10-CM

## 2017-11-09 DIAGNOSIS — R5383 Other fatigue: Secondary | ICD-10-CM | POA: Diagnosis not present

## 2017-11-09 DIAGNOSIS — D6481 Anemia due to antineoplastic chemotherapy: Secondary | ICD-10-CM | POA: Diagnosis not present

## 2017-11-09 LAB — COMPREHENSIVE METABOLIC PANEL
ALBUMIN: 3.7 g/dL (ref 3.5–5.0)
ALT: 15 U/L — ABNORMAL LOW (ref 17–63)
ANION GAP: 8 (ref 5–15)
AST: 19 U/L (ref 15–41)
Alkaline Phosphatase: 88 U/L (ref 38–126)
BILIRUBIN TOTAL: 0.6 mg/dL (ref 0.3–1.2)
BUN: 20 mg/dL (ref 6–20)
CHLORIDE: 108 mmol/L (ref 101–111)
CO2: 24 mmol/L (ref 22–32)
Calcium: 9 mg/dL (ref 8.9–10.3)
Creatinine, Ser: 0.83 mg/dL (ref 0.61–1.24)
GFR calc Af Amer: >60 mL/min (ref >60)
GFR calc non Af Amer: >60 mL/min (ref >60)
GLUCOSE: 91 mg/dL (ref 65–99)
POTASSIUM: 3.7 mmol/L (ref 3.5–5.1)
SODIUM: 140 mmol/L (ref 135–145)
TOTAL PROTEIN: 6.7 g/dL (ref 6.5–8.1)

## 2017-11-09 LAB — CBC WITH DIFFERENTIAL/PLATELET
BASOS ABS: 0.1 10*3/uL (ref 0–0.1)
Basophils Relative: 1 %
EOS PCT: 3 %
Eosinophils Absolute: 0.1 10*3/uL (ref 0–0.7)
HEMATOCRIT: 30 % — AB (ref 40.0–52.0)
Hemoglobin: 10.4 g/dL — ABNORMAL LOW (ref 13.0–18.0)
LYMPHS ABS: 0.4 10*3/uL — AB (ref 1.0–3.6)
LYMPHS PCT: 9 %
MCH: 30.9 pg (ref 26.0–34.0)
MCHC: 34.5 g/dL (ref 32.0–36.0)
MCV: 89.6 fL (ref 80.0–100.0)
MONO ABS: 0.6 10*3/uL (ref 0.2–1.0)
MONOS PCT: 14 %
NEUTROS ABS: 3.1 10*3/uL (ref 1.4–6.5)
Neutrophils Relative %: 73 %
PLATELETS: 169 10*3/uL (ref 150–440)
RBC: 3.35 MIL/uL — ABNORMAL LOW (ref 4.40–5.90)
RDW: 15.2 % — AB (ref 11.5–14.5)
WBC: 4.3 10*3/uL (ref 3.8–10.6)

## 2017-11-09 MED ORDER — SODIUM CHLORIDE 0.9% FLUSH
10.0000 mL | INTRAVENOUS | Status: AC | PRN
  Administered 2017-11-09: 10 mL
  Filled 2017-11-09: qty 10

## 2017-11-09 MED ORDER — HEPARIN SOD (PORK) LOCK FLUSH 100 UNIT/ML IV SOLN
500.0000 [IU] | INTRAVENOUS | Status: AC | PRN
  Administered 2017-11-09: 500 [IU]

## 2017-11-09 NOTE — Progress Notes (Signed)
Hematology/Oncology follow-up note [facility] Telephone:([phone] [phone]   Patient Care Team: Tracie Harrier, MD as PCP - General (Internal Medicine)  REFERRING PROVIDER: Tracie Harrier, MD  REASON FOR VISIT Follow up for treatment of diffuse large B cell lymphoma.   HISTORY OF PRESENTING ILLNESS:  John Gallegos is a  72 y.o.  male with PMH listed below presented for follow-up for treatment of diffuse large B cell lymphoma.  Pertinent history of oncology workup includes: Patient was found to have microscopic hematuria on 02/23/2017 with 4-10 RBC's/hpf.    Urine culture was negative.having symptoms of nocturia He is smoker. 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 He was in the [employer] and reports to be exposed to [place] in [place]. CT scan 04/20/2017 which revealed moderate left hydronephrosis and delayed excretion down to a large 4.4 cm mass in or around the left proximal ureter. There was also hazy soft tissue in the fat surrounding the celiac axis and superior mesenteric artery. Thickening of the right diaphragmatic crus. High gastrohepatic ligament lymphadenopathy measures up to 11 mm. Peripancreatic and root of small bowel mesentery adenopathy measures up to 2.6 cm.  He was seen by urologist Dr. Pilar Jarvis and he underwent cystoscopy, left retrograde pyelogram, left ureteroscopy and placement of left ureteral stent placement on 05/13/2017. Finding during the procedure that he has extrinsic compression of the ureter, possibly from lymphoma.  # Pathology:  Biopsy of paraspinal musculature at L2. Pathology results reviewed large B cell lymphoma. It is CD20 positive, CD10 positive. Flow cytometry shows a small clonal population of large B cells positive for CD10 and absent cop and lambda light chains. The abnormal cells represent 2% of the total cells.The morphology, initial IHC panel, and flow cytometry supports classification as  B-cell lymphoma, CD10 positive, with large cell features.diffuse large B-cell lymphoma, germinal center B-cell type. Discussed with pathologist Dr.Onley, Ki67 50%, MUM1 negative, MYC negative, Cycline D1 negative, CD30 negative, FISH is positive for BCL2 gene rearrangement. Negative for MYC and BCL6. 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   # Bone marrow Biopsy: negative for lymphoma involvement.  # PET scan 05/18/2017  IMPRESSION: 1. Multifocal metastatic disease within the LEFT retroperitoneum, central mesenteries, and RIGHT paraspinal musculature. 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 Recommend percutaneous biopsy of the RIGHT paraspinal musculature at L2. 2. 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 # Interim PET scan 07/28/2017 after 2 cycles of RCHOP 1. Marked improvement, with row solution of the prior bony lesions and significant reduction in activity of the central mesenteric and right gastric adenopathy. Central mesenteric nodal is currently Deauville 3, previously Deauville 4 and Deauville 5. 2. Improvement in the right paraspinal muscular activity, currently Deauville 3, previously Deauville 4. I also discussed with radiologist that there has been further improvement in the left periureteral density, likely reflecting treatment response.   # Prognostic Model for the risk of CNS lymphoma involvement per NCCN guideline,  Patient is intermediate risk with score of 2, one from being >60, and one from more than extranodal involvement>1 sites (bone, paraspinal muscle involvement). CNS prophylaxis is usually recommended if score 4-6.  CNS prophylaxis was not offered.    # Treatment:  06/21/2017 Cycle 1 RCHOP [name]. Dexamethasone 40m on Day 1,  Prednisone 80 mg daily Day 2-5. 07/12/2017 Cycle 2 RCHOP /[name].Dexamethasone 214mon Day 1, Prednisone 80 mg daily Day 2-5 08/03/2017 Cycle 3  RCHOP/onpro.Dexamethasone  29m on Day 1, Prednisone 80 mg daily Day 2-5  08/24/2017 Cycle 4 RCHOP/onpro.Dexamethasone 257mon Day 1, Prednisone 80 mg daily Day 2-5  09/14/2017 Cycle 5 RCHOP/onpro.Dexamethasone 2055mn Day 1, Prednisone 80 mg daily Day 2-5  10/05/2017 Cycle 6 RCHOP/onpro.Dexamethasone 53m94m Day 1, Prednisone 80 mg daily Day 2-5  INTERVAL HISTORY 72 [name] for evaluation of after 6 cycles of RCHOP.   Patient reports feeling well.Good appetite.  Still have some "balancing problems".  He has very mild numbness and tingling on his toes.  Denies any night sweats, fever, weight loss.  Fatigue has improved a lot. During interval he has had PET scan, which showed stable exam.  No new or progressive interval findings.  There is a similar appearance of ill-defined soft tissue in the central mesenteric root of the small bowel.  SUV is 2.9, without discrete lymphadenopathy. Review of Systems  Review of Systems  Constitutional: Negative for fever, malaise/fatigue and weight loss.  HENT: Negative for congestion, ear pain, hearing loss, nosebleeds and tinnitus.   Eyes: Negative for blurred vision, photophobia, pain, discharge and redness.  Respiratory: Negative for cough, hemoptysis, sputum production and wheezing.   Cardiovascular: Negative for chest pain, palpitations, orthopnea, leg swelling and PND.  Gastrointestinal: Negative for abdominal pain, blood in stool, constipation, diarrhea, heartburn, nausea and vomiting.  Genitourinary: Negative for dysuria, frequency and urgency.  Musculoskeletal: Negative for back pain, joint pain, myalgias and neck pain.       Chronic back pain Intermittent leg swelling.   Skin: Negative for itching and rash.  Neurological: Positive for tingling. Negative for dizziness, sensory change, focal weakness, seizures and headaches.       Balancing problem  Endo/Heme/Allergies: Negative for environmental allergies. Does not bruise/bleed easily.  Psychiatric/Behavioral:  Negative for depression, substance abuse and suicidal ideas. The patient is not nervous/anxious and does not have insomnia.    MEDICAL HISTORY:  Past Medical History:  Diagnosis Date  . Cancer (HCC)[place] non hodgkin Bcell lymphoma  . Gout   . Hard of hearing   . Hiatal hernia   . Hypertension   . Urinary incontinence     SURGICAL HISTORY: Past Surgical History:  Procedure Laterality Date  . CYSTOSCOPY W/ RETROGRADES Left 08/19/2017   Procedure: CYSTOSCOPY WITH RETROGRADE PYELOGRAM;  Surgeon: StoiAbbie Sons;  Location: [facility];  Service: Urology;  Laterality: Left;  . CYSTOSCOPY W/ URETERAL STENT REMOVAL Left 08/19/2017   Procedure: CYSTOSCOPY WITH STENT REMOVAL;  Surgeon: StoiAbbie Sons;  Location: [facility];  Service: Urology;  Laterality: Left;  . CYSTOSCOPY WITH BIOPSY Left 05/13/2017   Procedure: CYSTOSCOPY WITH URETERAL BIOPSY;  Surgeon: BudzNickie Retort;  Location: [facility];  Service: Urology;  Laterality: Left;  . CYSTOSCOPY WITH STENT PLACEMENT Left 05/13/2017   Procedure: CYSTOSCOPY WITH STENT PLACEMENT;  Surgeon: BudzNickie Retort;  Location: [facility];  Service: Urology;  Laterality: Left;  . PORTA CATH INSERTION N/A 06/14/2017   Procedure: PORTA CATH INSERTION;  Surgeon: Dew,Algernon Huxley;  [facility];  Service: Cardiovascular;  Laterality: N/A;  . 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 right knne surgery     needle went through knee  . TONSILLECTOMY    . URETEROSCOPY Left 05/13/2017   Procedure: URETEROSCOPY;  Surgeon: BudzNickie Retort;  Location: [facility];  Service: Urology;  Laterality: Left;    SOCIAL HISTORY: Social History  Socioeconomic History  . Marital status: Married    Spouse name: Not on file  . Number of children: Not on file  . Years of education: Not on file  . Highest education level: Not on file  Occupational History  . Not on file  Social Needs  . Financial resource strain: Not on file  . Food  insecurity:    Worry: Not on file    Inability: Not on file  . Transportation needs:    Medical: Not on file    Non-medical: Not on file  Tobacco Use  . Smoking status: Former Smoker    Packs/day: 0.50    Years: 50.00    Pack years: 25.00    Types: Cigarettes  . Smokeless tobacco: Never Used  Substance and Sexual Activity  . Alcohol use: Yes    Alcohol/week: 1.8 oz    Types: 3 Shots of liquor per week    Comment: occ. beer  . Drug use: No  . Sexual activity: Not on file  Lifestyle  . Physical activity:    Days per week: Not on file    Minutes per session: Not on file  . Stress: Not on file  Relationships  . Social connections:    Talks on phone: Not on file    Gets together: Not on file    Attends religious service: Not on file    Active member of club or organization: Not on file    Attends meetings of clubs or organizations: Not on file    Relationship status: Not on file  . Intimate partner violence:    Fear of current or ex partner: Not on file    Emotionally abused: Not on file    Physically abused: Not on file    Forced sexual activity: Not on file  Other Topics Concern  . Not on file  Social History Narrative  . Not on file    FAMILY HISTORY: Family History  Problem Relation Age of Onset  . Pancreatic cancer Mother   . Aneurysm Mother   . Heart attack Mother   . Lung cancer Father   . Lymphoma Sister   . Prostate cancer Neg Hx   . Kidney cancer Neg Hx   . Bladder Cancer Neg Hx     ALLERGIES:  has No Known Allergies.  MEDICATIONS:  Current Outpatient Medications  Medication Sig Dispense Refill  . acetaminophen (TYLENOL) 500 MG tablet Take 500 mg by mouth every 6 (six) hours as needed for mild pain or moderate pain.    Marland Kitchen amLODipine (NORVASC) 10 MG tablet Take 10 mg by mouth every evening.     Marland Kitchen amLODipine (NORVASC) 5 MG tablet Take 5 mg by mouth daily.    Marland Kitchen aspirin EC 81 MG tablet Take 81 mg by mouth daily.    Marland Kitchen ezetimibe (ZETIA) 10 MG tablet  Take 1 tablet (10 mg total) by mouth daily. 90 tablet 3  . hydrALAZINE (APRESOLINE) 25 MG tablet Take 25 mg by mouth every evening.     Marland Kitchen losartan (COZAAR) 100 MG tablet Take 100 mg by mouth every evening.    . pravastatin (PRAVACHOL) 80 MG tablet Take 1 tablet (80 mg total) by mouth every evening. 90 tablet 3  . zolpidem (AMBIEN) 10 MG tablet Take 10 mg by mouth at bedtime as needed for sleep.      No current facility-administered medications for this visit.       Marland Kitchen  PHYSICAL EXAMINATION: ECOG PERFORMANCE STATUS:  1 - Symptomatic but completely ambulatory Vitals:   11/09/17 1451  BP: (!) 169/90  Pulse: 60  Resp: 18  Temp: (!) 97.2 F (36.2 C)   Filed Weights   11/09/17 1451  Weight: 225 lb (102.1 kg)    Physical Exam  Constitutional: He is oriented to person, place, and time and well-developed, well-nourished, and in no distress. No distress.  HENT:  Head: Normocephalic and atraumatic.  Mouth/Throat: Oropharynx is clear and moist. No oropharyngeal exudate.  Eyes: Pupils are equal, round, and reactive to light. Conjunctivae and EOM are normal. Right eye exhibits no discharge. No scleral icterus.  Neck: Normal range of motion. Neck supple.  Cardiovascular: Normal rate, regular rhythm and normal heart sounds. Exam reveals no gallop.  No murmur heard. Pulmonary/Chest: Effort normal and breath sounds normal. No respiratory distress. He has no rales. He exhibits no tenderness.  Abdominal: Soft. Bowel sounds are normal. There is no tenderness. There is no rebound.  Musculoskeletal: Normal range of motion. He exhibits no deformity.  trace lower extremity swelling.   Lymphadenopathy:    He has no cervical adenopathy.  Neurological: He is alert and oriented to person, place, and time. No cranial nerve deficit. Gait normal. Coordination normal.  Skin: Skin is warm and dry. No rash noted. No erythema.  Psychiatric: Affect normal.    LABORATORY DATA:  I have reviewed the data as  listed Lab Results  Component Value Date   WBC 4.3 11/09/2017   HGB 10.4 (L) 11/09/2017   HCT 30.0 (L) 11/09/2017   MCV 89.6 11/09/2017   PLT 169 11/09/2017   Recent Labs    10/05/17 0810 10/19/17 0918 11/09/17 1437  NA 137 141 140  K 3.6 3.9 3.7  CL 106 108 108  CO2 _0 GLUCOSE 125* 111* 91  BUN _1 CREATININE 0.93 0.83 0.83  CALCIUM 9.1 8.8* 9.0  GFRNONAA >60 >60 >60  GFRAA >60 >60 >60  PROT 6.9 6.4* 6.7  ALBUMIN 3.5 3.3* 3.7  AST 17 13* 19  ALT 11* 10* 15*  ALKPHOS 94 97 88  BILITOT 0.5 0.4 0.6   Hepatitis B antigen negative. 2-D echo.showed LVEF 60%  Post chemotherapy PET scan on  11/09/2017  1. Stable exam.  No new or progressive interval findings. 2. Interval removal of left ureteral stent with no residual hypermetabolism above background soft tissue levels along the course of the left ureter. There is no left hydroureteronephrosis. 3. Similar appearance of ill-defined soft tissue in the central mesenteric root of the small bowel. As on the prior study there is low level FDG uptake in this region without discrete lymphadenopathy. Right gastric lymphadenopathy described on the previous study is completely resolved. 4. Similar to the most recent comparison, there is no evidence for asymmetric FDG uptake in the paraspinal musculature as noted on earlier studies. 5. Additional incidental findings as noted above.  ASSESSMENT & PLAN:  This is a 72 y.o. male with stage IV diffuse large B-cell lymphoma, s/p first-line chemotherapy with R CHOP with curative intent, present for follow-up.  1. Diffuse large B-cell lymphoma of extranodal site excluding spleen and other solid organs (HCC)   2. Balance problem   3. Anemia due to antineoplastic chemotherapy   # s/p 6 cycles of RCHOP.  PET scan is stable without new progression recurrence.  Focal area of ill define soft tissue in the mesenteric root, SUV 2.9 >Mediastinal blood pool activity: SUV max 2.1 Will  present his  case on Tumor board to determine if further consolidation RT is needed.   # Anemia likely secondary chemotherapy, maybe combination of recent sinus infection. normal folate, B12, iron panel, Continue monitor. Hemoglobin improving.   # Fatigue secondary to chemotherapy/Anemia. Continue monitor. # ?Balance problem: patient has no neurological deficit, focal weakness, his gait is normal, his "balancing problem" comes after he walks a certain distance and due to fatigue. ? neuropathy secondary to vincristine. Reviewed his previous Brain images, he had non remarkable Brain MRI in July 2018 due to dizziness/balance problem,  There was Mild chronic appearing small vessel change of the pons and cerebral hemispheric white matter. In 2015, he had CT head done due to same reason. I wonder if this has been a chronic issure.  Normal MRI brain, not explaining his symptoms.  will refer to neurology.   - The patient knows to call the clinic with any problems questions or concerns.  Return of visit: lab/MD in 3 months.  Earlie Server, MD, PhD Hematology Oncology [facility] Pager- [phone] 11/09/17

## 2017-11-09 NOTE — Progress Notes (Signed)
Pt in for follow up and PET scan/MRI results.

## 2017-11-11 ENCOUNTER — Telehealth: Payer: Self-pay | Admitting: *Deleted

## 2017-11-11 NOTE — Telephone Encounter (Signed)
Patient called and states he was instructed to call Dr Tasia Catchings to fiond out wahat the decision is from Tumor Board regarding him . Please return call [phone]

## 2017-11-11 NOTE — Telephone Encounter (Signed)
Please let him know his case will be discussed next Thursday and I will call them after tumor board. Thanks.

## 2017-11-11 NOTE — Telephone Encounter (Signed)
Patient informed of doctor response 

## 2017-11-13 ENCOUNTER — Other Ambulatory Visit: Payer: Self-pay | Admitting: Oncology

## 2017-11-15 ENCOUNTER — Other Ambulatory Visit: Payer: Self-pay | Admitting: Pathology

## 2017-11-17 ENCOUNTER — Other Ambulatory Visit: Payer: Self-pay | Admitting: Oncology

## 2017-11-17 ENCOUNTER — Telehealth: Payer: Self-pay | Admitting: Oncology

## 2017-11-17 DIAGNOSIS — C8339 Diffuse large B-cell lymphoma, extranodal and solid organ sites: Secondary | ICD-10-CM

## 2017-11-17 NOTE — Telephone Encounter (Signed)
Case was discussed on tumor board and consensus is observation and repeat PET scan in 3 months.  Tumor board consensus decision was communicated to patient over the phone.

## 2017-12-21 ENCOUNTER — Inpatient Hospital Stay: Payer: Medicare HMO

## 2017-12-21 ENCOUNTER — Inpatient Hospital Stay (HOSPITAL_BASED_OUTPATIENT_CLINIC_OR_DEPARTMENT_OTHER): Payer: Medicare HMO | Admitting: Oncology

## 2017-12-21 ENCOUNTER — Inpatient Hospital Stay: Payer: Medicare HMO | Attending: Oncology

## 2017-12-21 VITALS — BP 173/88 | HR 60 | Temp 97.4°F | Wt 220.0 lb

## 2017-12-21 DIAGNOSIS — Z95828 Presence of other vascular implants and grafts: Secondary | ICD-10-CM

## 2017-12-21 DIAGNOSIS — C8339 Diffuse large B-cell lymphoma, extranodal and solid organ sites: Secondary | ICD-10-CM | POA: Diagnosis not present

## 2017-12-21 DIAGNOSIS — Z452 Encounter for adjustment and management of vascular access device: Secondary | ICD-10-CM | POA: Insufficient documentation

## 2017-12-21 MED ORDER — SODIUM CHLORIDE 0.9% FLUSH
10.0000 mL | Freq: Once | INTRAVENOUS | Status: AC
  Administered 2017-12-21: 10 mL via INTRAVENOUS
  Filled 2017-12-21: qty 10

## 2017-12-21 MED ORDER — HEPARIN SOD (PORK) LOCK FLUSH 100 UNIT/ML IV SOLN
500.0000 [IU] | Freq: Once | INTRAVENOUS | Status: AC
  Administered 2017-12-21: 500 [IU] via INTRAVENOUS

## 2017-12-21 NOTE — Progress Notes (Signed)
Survivorship Care Plan visit completed.  Treatment summary reviewed and given to patient.  [facility] answers booklet reviewed and given to patient.  CARE program and Cancer Transitions discussed with patient along with other resources cancer center offers to patients and caregivers.  Patient verbalized understanding.    

## 2017-12-21 NOTE — Progress Notes (Signed)
CLINIC:  Survivorship   REASON FOR VISIT:  Routine follow-up post-treatment for a recent history of stage IV large B-cell lymphoma  BRIEF ONCOLOGIC HISTORY:  Patient with initial consultation with Dr. Tasia Catchings was in October 2018 when he was referred by Dr. Ginette Pitman for a kidney mass.  He was found to have microscopic hematuria on 02/23/2017.  Urine culture was negative but he was having symptoms of nocturia.  He was a current smoker.  CT scan in September 2018 revealed moderate left hydronephrosis and delayed excretion down to a large 4 x 4 centimeter mass in and around the left proximal ureter.  Hazy soft tissue was also identified in the fat surrounding the celiac axis and superior mesenteric artery.  He was seen by urologist Dr. Pilar Jarvis and underwent cystoscopy, left retrograde pyelogram and left ureteroscopy and placement of left ureteral stent on 05/13/2017.  During the procedure he was found to have extrinsic compression of the ureter, possibly from lymphoma.   Interval PET scan completed  was concerning for lymphoma.  Biopsy of paraspinal muscle revealed large B-cell lymphoma.  Bone marrow negative for lymphoma.  Dr. Tasia Catchings recommended R CHOP x6 cycles with repeat PET after 2 cycles.   INTERVAL HISTORY:  John Gallegos presents to the [facility] today for our initial meeting to review her survivorship care plan detailing his treatment course for stage IV large B-cell lymphoma, as well as monitoring long-term side effects of that treatment, education regarding health maintenance, screening, and overall wellness and health promotion.     Overall, John Gallegos reports feeling quite well since completing chemotherapy with 6 cycles of R-CHOP.  Last dose given on 10/05/2017.  States today he is doing well.  Reports his previously mentioned unstable balance is intermittent. He was referred to a neurologist by Dr. Tasia Catchings, but patient declined at that time.  States this is no worse or no better.  Continues  to have issues with his balance but he does not feel it needs to be worked up at this time.  Offered a referral if needed. Continues to complain of chronic back pain, intermittent leg swelling relieved with elevation and rest, tingling sensation in bilateral upper and lower extremities thought to be from chronic carpal tunnel and or previous treatment with vincristine.  States that appetite is great.  Fatigue is improving each day.  Overall feels well.  Currently not prescribed any medication from the cancer center.  REVIEW OF SYSTEMS:  Review of Systems  Constitutional: Positive for fatigue (Improving). Negative for appetite change, chills and fever.  HENT:  Negative.  Negative for hearing loss, lump/mass, mouth sores and nosebleeds.   Eyes: Negative.  Negative for eye problems.  Respiratory: Negative for cough, hemoptysis and shortness of breath.   Cardiovascular: Negative.  Negative for chest pain and leg swelling.  Gastrointestinal: Negative.  Negative for abdominal pain, blood in stool, constipation, diarrhea, nausea and vomiting.  Endocrine: Negative.  Negative for hot flashes.  Genitourinary: Negative.  Negative for bladder incontinence, difficulty urinating, dysuria, frequency and hematuria.   Musculoskeletal: Positive for back pain (Chronic) and gait problem (Intermittent). Negative for flank pain and myalgias.  Skin: Negative.  Negative for itching and rash.  Neurological: Positive for dizziness (Intermittent), gait problem (Intermittent), light-headedness (Intermittent) and numbness (Bilateral upper and lower extremities). Negative for headaches.  Hematological: Negative.  Negative for adenopathy.  Psychiatric/Behavioral: Negative for confusion. The patient is not nervous/anxious.    ONCOLOGY TREATMENT TEAM:  1. Medical Oncologist: Dr. Tasia Catchings  PAST MEDICAL/SURGICAL HISTORY:  Past Medical History:  Diagnosis Date  . Cancer ([place])    non hodgkin Bcell lymphoma  . Gout   . Hard of  hearing   . Hiatal hernia   . Hypertension   . Urinary incontinence    Past Surgical History:  Procedure Laterality Date  . CYSTOSCOPY W/ RETROGRADES Left 08/19/2017   Procedure: CYSTOSCOPY WITH RETROGRADE PYELOGRAM;  Surgeon: Abbie Sons, MD;  Location: [facility];  Service: Urology;  Laterality: Left;  . CYSTOSCOPY W/ URETERAL STENT REMOVAL Left 08/19/2017   Procedure: CYSTOSCOPY WITH STENT REMOVAL;  Surgeon: Abbie Sons, MD;  Location: [facility];  Service: Urology;  Laterality: Left;  . CYSTOSCOPY WITH BIOPSY Left 05/13/2017   Procedure: CYSTOSCOPY WITH URETERAL BIOPSY;  Surgeon: Nickie Retort, MD;  Location: [facility];  Service: Urology;  Laterality: Left;  . CYSTOSCOPY WITH STENT PLACEMENT Left 05/13/2017   Procedure: CYSTOSCOPY WITH STENT PLACEMENT;  Surgeon: Nickie Retort, MD;  Location: [facility];  Service: Urology;  Laterality: Left;  . PORTA CATH INSERTION N/A 06/14/2017   Procedure: PORTA CATH INSERTION;  Surgeon: Algernon Huxley, MD;  Location: [facility];  Service: Cardiovascular;  Laterality: N/A;  . 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 right knne surgery     needle went through knee  . TONSILLECTOMY    . URETEROSCOPY Left 05/13/2017   Procedure: URETEROSCOPY;  Surgeon: Nickie Retort, MD;  Location: [facility];  Service: Urology;  Laterality: Left;     ALLERGIES:  No Known Allergies   CURRENT MEDICATIONS:  Outpatient Encounter Medications as of 12/21/2017  Medication Sig  . acetaminophen (TYLENOL) 500 MG tablet Take 500 mg by mouth every 6 (six) hours as needed for mild pain or moderate pain.  Marland Kitchen amLODipine (NORVASC) 5 MG tablet Take 5 mg by mouth daily.  Marland Kitchen aspirin EC 81 MG tablet Take 81 mg by mouth daily.  Marland Kitchen ezetimibe (ZETIA) 10 MG tablet Take 1 tablet (10 mg total) by mouth daily.  . pravastatin (PRAVACHOL) 80 MG tablet Take 1 tablet (80 mg total) by mouth every evening.  . zolpidem (AMBIEN) 10 MG tablet Take 10 mg by mouth at bedtime as needed  for sleep.   Marland Kitchen amLODipine (NORVASC) 10 MG tablet Take 10 mg by mouth every evening.   . hydrALAZINE (APRESOLINE) 25 MG tablet Take 25 mg by mouth every evening.   Marland Kitchen losartan (COZAAR) 100 MG tablet Take 100 mg by mouth every evening.  . [EXPIRED] heparin lock flush 100 unit/mL   . [EXPIRED] sodium chloride flush (NS) 0.9 % injection 10 mL    No facility-administered encounter medications on file as of 12/21/2017.      ONCOLOGIC FAMILY HISTORY:  Family History  Problem Relation Age of Onset  . Pancreatic cancer Mother   . Aneurysm Mother   . Heart attack Mother   . Lung cancer Father   . Lymphoma Sister   . Prostate cancer Neg Hx   . Kidney cancer Neg Hx   . Bladder Cancer Neg Hx      PHYSICAL EXAMINATION:  Vital Signs:   Vitals:   12/21/17 1419  BP: (!) 173/88  Pulse: 60  Temp: (!) 97.4 F (36.3 C)   Filed Weights   12/21/17 1419  Weight: 220 lb (99.8 kg)   Physical Exam  Constitutional: He is oriented to person, place, and time and well-developed, well-nourished, and in no distress. Vital signs are normal.  HENT:  Head: Normocephalic and atraumatic.  Eyes: Pupils are equal, round, and reactive to light.  Neck: Normal range of motion.  Cardiovascular: Normal rate, regular rhythm and normal heart sounds.  No murmur heard. Pulmonary/Chest: Effort normal and breath sounds normal. He has no wheezes.  Abdominal: Soft. Normal appearance and bowel sounds are normal. He exhibits no distension. There is no tenderness.  Musculoskeletal: Normal range of motion. He exhibits no edema.  Neurological: He is alert and oriented to person, place, and time. Gait normal.  Skin: Skin is warm and dry. No rash noted.  Psychiatric: Mood, memory, affect and judgment normal.    LABORATORY DATA:  CBC Latest Ref Rng & Units 11/09/2017 10/27/2017 10/19/2017  WBC 3.8 - 10.6 K/uL 4.3 5.1 6.6  Hemoglobin 13.0 - 18.0 g/dL 10.4(L) 9.7(L) 8.9(L)  Hematocrit 40.0 - 52.0 % 30.0(L) 28.1(L) 25.6(L)    Platelets 150 - 440 K/uL 169 261 117(L)   DIAGNOSTIC IMAGING:   PET scan from 11/07/2017  IMPRESSION: 1. Stable exam.  No new or progressive interval findings. 2. Interval removal of left ureteral stent with no residual hypermetabolism above background soft tissue levels along the course of the left ureter. There is no left hydroureteronephrosis. 3. Similar appearance of ill-defined soft tissue in the central mesenteric root of the small bowel. As on the prior study there is low level FDG uptake in this region without discrete lymphadenopathy. Right gastric lymphadenopathy described on the previous study is completely resolved. 4. Similar to the most recent comparison, there is no evidence for asymmetric FDG uptake in the paraspinal musculature as noted on earlier studies. 5. Additional incidental findings as noted above.   Electronically Signed   By: Misty Stanley M.D.   On: 11/07/2017 13:34  ASSESSMENT AND PLAN:  Mr.. Gallegos is a pleasant 72 y.o. male with Stage IV large B-cell lymphoma. S/p 6 cycles of R-CHOP. Completed 10/05/2017.  Tolerated well.  He presents to the survivorship clinic for our initial meeting in routine follow-up post completion of treatment.  1.  Stage IV large B-cell lymphoma:  John Gallegos is continuing to recover from treatment. He will follow-up with her medical oncologist, Dr. Tasia Catchings in 1 month on 01/11/2018.  Has repeat PET scan on 01/09/2018.  Today, a comprehensive survivorship care plan and treatment summary was reviewed with the patient today detailing his lymphoma  diagnosis, treatment course, potential late/long-term effects of treatment, appropriate follow-up care with recommendations for the future, and patient education resources.  A copy of this summary, along with a letter will be sent to the patient's primary care provider via mail/fax/In Basket message after today's visit.    2. Health maintenance and wellness promotion: John Gallegos was  encouraged to consume 5-7 servings of fruits and vegetables per day. We reviewed the "Nutrition Rainbow" handout, as well as the handout "Take Control of Your Health and Reduce Your Cancer Risk" from the [place]. He was also encouraged to engage in moderate to vigorous exercise for 30 minutes per day most days of the week. We discussed the [employer] fitness program, which is designed for cancer survivors to help them become more physically fit after cancer treatments. He was instructed to limit his alcohol consumption and to continue to abstain from tobacco use.    3. Support services/counseling: It is not uncommon for this period of the patient's cancer care trajectory to be one of many emotions and stressors.  We discussed an opportunity for him to participate in the  next session of [facility] ("Finding Your New Normal") support group series designed for patients after they have completed treatment.   John Gallegos was encouraged to take advantage of our many other support services programs, support groups, and/or counseling in coping with his new life as a cancer survivor. He was offered support today through active listening and expressive supportive counseling. He was given information regarding our available services and encouraged to contact me with any questions or for help enrolling in any of our support group/programs.   Dispo:   -Return to cancer center on 01/09/2018 for repeat PET scan and on 11/11/2017 for follow-up with Dr. Tasia Catchings. -He is welcome to return back to the Survivorship Clinic at any time; no additional follow-up needed at this time.  -Symptom management clinic was introduced to patient if he has any clinical concerns. -Consider referral back to survivorship as a long-term survivor for continued surveillance.    A total of (30) minutes of face-to-face time was spent with this patient with greater than 50% of that time in counseling and care-coordination.   John Gallegos, AGNP-C Survivorship Program [place] [facility] [place]   Note: [place] Tracie Harrier, [place] [phone]

## 2018-01-09 ENCOUNTER — Ambulatory Visit
Admission: RE | Admit: 2018-01-09 | Discharge: 2018-01-09 | Disposition: A | Discharge status: Home / Self Care | Payer: Medicare HMO | Source: Ambulatory Visit | Attending: Oncology | Admitting: Oncology

## 2018-01-09 DIAGNOSIS — C8330 Diffuse large B-cell lymphoma, unspecified site: Secondary | ICD-10-CM | POA: Diagnosis not present

## 2018-01-09 DIAGNOSIS — C8339 Diffuse large B-cell lymphoma, extranodal and solid organ sites: Secondary | ICD-10-CM

## 2018-01-09 DIAGNOSIS — J9811 Atelectasis: Secondary | ICD-10-CM | POA: Insufficient documentation

## 2018-01-09 DIAGNOSIS — I700 Atherosclerosis of aorta: Secondary | ICD-10-CM | POA: Diagnosis not present

## 2018-01-09 LAB — GLUCOSE, CAPILLARY: Glucose-Capillary: 95 mg/dL (ref 65–99)

## 2018-01-09 MED ORDER — FLUDEOXYGLUCOSE F - 18 (FDG) INJECTION
12.0000 | Freq: Once | INTRAVENOUS | Status: AC | PRN
  Administered 2018-01-09: 11.95 via INTRAVENOUS

## 2018-01-11 ENCOUNTER — Encounter: Payer: Self-pay | Admitting: Oncology

## 2018-01-11 ENCOUNTER — Inpatient Hospital Stay: Payer: Medicare HMO | Attending: Oncology | Admitting: Oncology

## 2018-01-11 VITALS — BP 166/94 | HR 76 | Temp 97.4°F | Resp 18 | Wt 221.0 lb

## 2018-01-11 DIAGNOSIS — R5383 Other fatigue: Secondary | ICD-10-CM | POA: Diagnosis not present

## 2018-01-11 DIAGNOSIS — D6481 Anemia due to antineoplastic chemotherapy: Secondary | ICD-10-CM | POA: Insufficient documentation

## 2018-01-11 DIAGNOSIS — D649 Anemia, unspecified: Secondary | ICD-10-CM

## 2018-01-11 DIAGNOSIS — Z95828 Presence of other vascular implants and grafts: Secondary | ICD-10-CM

## 2018-01-11 DIAGNOSIS — C8339 Diffuse large B-cell lymphoma, extranodal and solid organ sites: Secondary | ICD-10-CM | POA: Diagnosis not present

## 2018-01-11 DIAGNOSIS — T451X5A Adverse effect of antineoplastic and immunosuppressive drugs, initial encounter: Secondary | ICD-10-CM

## 2018-01-11 DIAGNOSIS — R2689 Other abnormalities of gait and mobility: Secondary | ICD-10-CM

## 2018-01-11 NOTE — Progress Notes (Signed)
Pt and wife in for follow up and results.

## 2018-01-11 NOTE — Progress Notes (Signed)
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   HISTORY OF PRESENTING ILLNESS:  John Gallegos is a  72 y.o.  male with PMH listed below presented for follow-up for treatment of diffuse large B cell lymphoma.  Pertinent history of oncology workup includes: Patient was found to have microscopic hematuria on 02/23/2017 with 4-10 RBC's/hpf.    Urine culture was negative.having symptoms of nocturia He is smoker. 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 He was in the [employer] and reports to be exposed to [place] in [place]. CT scan 04/20/2017 which revealed moderate left hydronephrosis and delayed excretion down to a large 4.4 cm mass in or around the left proximal ureter. There was also hazy soft tissue in the fat surrounding the celiac axis and superior mesenteric artery. Thickening of the right diaphragmatic crus. High gastrohepatic ligament lymphadenopathy measures up to 11 mm. Peripancreatic and root of small bowel mesentery adenopathy measures up to 2.6 cm.  He was seen by urologist Dr. Pilar Jarvis and he underwent cystoscopy, left retrograde pyelogram, left ureteroscopy and placement of left ureteral stent placement on 05/13/2017. Finding during the procedure that he has extrinsic compression of the ureter, possibly from lymphoma.  # Pathology:  Biopsy of paraspinal musculature at L2. Pathology results reviewed large B cell lymphoma. It is CD20 positive, CD10 positive. Flow cytometry shows a small clonal population of large B cells positive for CD10 and absent cop and lambda light  chains. The abnormal cells represent 2% of the total cells.The morphology, initial IHC panel, and flow cytometry supports classification as B-cell lymphoma, CD10 positive, with large cell features.diffuse large B-cell lymphoma, germinal center B-cell type. Discussed with pathologist Dr.Onley, Ki67 50%, MUM1 negative, MYC negative, Cycline D1 negative, CD30 negative, FISH is positive for BCL2 gene rearrangement. Negative for MYC and BCL6. 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   # Bone marrow Biopsy: negative for lymphoma involvement.  # PET scan 05/18/2017  IMPRESSION: 1. Multifocal metastatic disease within the LEFT retroperitoneum, central mesenteries, and RIGHT paraspinal musculature. 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 Recommend percutaneous biopsy of the RIGHT paraspinal musculature at L2. 2. 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 # Interim PET scan 07/28/2017 after 2 cycles of RCHOP 1. Marked improvement, with row solution of the prior bony lesions and significant reduction in activity of the central mesenteric and right gastric adenopathy. Central mesenteric nodal is currently Deauville 3, previously Deauville 4 and Deauville 5. 2. Improvement in the right paraspinal muscular activity, currently Deauville 3, previously Deauville 4. I also discussed with radiologist that there has been further improvement in the left periureteral density, likely reflecting treatment response.   # Prognostic Model for the risk of CNS lymphoma involvement per NCCN guideline,  Patient is intermediate risk with score of 2, one from being >60, and one from more than extranodal involvement>1 sites (bone, paraspinal muscle involvement). CNS prophylaxis is usually recommended if score 4-6.  CNS prophylaxis was not offered.    # Treatment:  06/21/2017 Cycle 1 RCHOP [name]. Dexamethasone 73m on Day 1,  Prednisone 80 mg daily  Day  2-5. 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 He reports feeling well, fatigue is better. No fever, night sweat weight loss.  Still has chronic balancing problems. Has not seen neurologist.   Review of Systems  Review of Systems  Constitutional: Negative for chills, fever, malaise/fatigue and weight loss.  HENT: Negative for congestion, ear discharge, ear pain, hearing loss, nosebleeds, sinus pain, sore throat and tinnitus.   Eyes: Negative for blurred vision, double vision, photophobia, pain, discharge and redness.  Respiratory: Negative for cough, hemoptysis, sputum production, shortness of breath and wheezing.   Cardiovascular: Negative for chest pain, palpitations, orthopnea, claudication, leg swelling and PND.  Gastrointestinal: Negative for abdominal pain, blood in stool, constipation, diarrhea, heartburn, melena, nausea and vomiting.  Genitourinary: Negative for dysuria, flank pain, frequency, hematuria and urgency.  Musculoskeletal: Negative for back pain, joint pain, myalgias and neck pain.       Chronic back pain Intermittent leg swelling.   Skin: Negative for itching and rash.  Neurological: Positive for tingling. Negative for dizziness, tremors, sensory change, focal weakness, seizures, weakness and headaches.  Endo/Heme/Allergies: Negative for environmental allergies. Does not bruise/bleed easily.  Psychiatric/Behavioral: Negative for  depression, hallucinations, substance abuse and suicidal ideas. The patient is not nervous/anxious and does not have insomnia.    MEDICAL HISTORY:  Past Medical History:  Diagnosis Date  . Cancer (HCC) [place] hodgkin Bcell lymphoma  . Gout   . Hard of hearing   . Hiatal hernia   . Hypertension   . Urinary incontinence     SURGICAL HISTORY: Past Surgical History:  Procedure Laterality Date  . CYSTOSCOPY W/ RETROGRADES Left 08/19/2017   Procedure: CYSTOSCOPY WITH RETROGRADE PYELOGRAM;  Surgeon: StoioAbbie Sons  Location: [facility];  Service: Urology;  Laterality: Left;  . CYSTOSCOPY W/ URETERAL STENT REMOVAL Left 08/19/2017   Procedure: CYSTOSCOPY WITH STENT REMOVAL;  Surgeon: StoioAbbie Sons  Location: [facility];  Service: Urology;  Laterality: Left;  . CYSTOSCOPY WITH BIOPSY Left 05/13/2017   Procedure: CYSTOSCOPY WITH URETERAL BIOPSY;  Surgeon: BudzyNickie Retort  Location: [facility];  Service: Urology;  Laterality: Left;  . CYSTOSCOPY WITH STENT PLACEMENT Left 05/13/2017   Procedure: CYSTOSCOPY WITH STENT PLACEMENT;  Surgeon: BudzyNickie Retort  Location: [facility];  Service: Urology;  Laterality: Left;  . PORTA CATH INSERTION N/A 06/14/2017   Procedure: PORTA CATH INSERTION;  Surgeon: Dew, Algernon Huxley  [facility];  Service: Cardiovascular;  Laterality: N/A;  . 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 right knne surgery     needle went through knee  . TONSILLECTOMY    . URETEROSCOPY Left 05/13/2017   Procedure: URETEROSCOPY;  Surgeon: BudzyNickie Retort  Location: [facility];  Service: Urology;  Laterality: Left;    SOCIAL HISTORY: Social History   Socioeconomic History  . Marital status: Married    Spouse name: Not on file  .  Number of children: Not on file  . Years of education: Not on file  . Highest education level: Not on file  Occupational History  . Not on file  Social Needs  . Financial resource strain: Not on file  . Food  insecurity:    Worry: Not on file    Inability: Not on file  . Transportation needs:    Medical: Not on file    Non-medical: Not on file  Tobacco Use  . Smoking status: Former Smoker    Packs/day: 0.50    Years: 50.00    Pack years: 25.00    Types: Cigarettes  . Smokeless tobacco: Never Used  Substance and Sexual Activity  . Alcohol use: Yes    Alcohol/week: 1.8 oz    Types: 3 Shots of liquor per week    Comment: occ. beer  . Drug use: No  . Sexual activity: Not on file  Lifestyle  . Physical activity:    Days per week: Not on file    Minutes per session: Not on file  . Stress: Not on file  Relationships  . Social connections:    Talks on phone: Not on file    Gets together: Not on file    Attends religious service: Not on file    Active member of club or organization: Not on file    Attends meetings of clubs or organizations: Not on file    Relationship status: Not on file  . Intimate partner violence:    Fear of current or ex partner: Not on file    Emotionally abused: Not on file    Physically abused: Not on file    Forced sexual activity: Not on file  Other Topics Concern  . Not on file  Social History Narrative  . Not on file    FAMILY HISTORY: Family History  Problem Relation Age of Onset  . Pancreatic cancer Mother   . Aneurysm Mother   . Heart attack Mother   . Lung cancer Father   . Lymphoma Sister   . Prostate cancer Neg Hx   . Kidney cancer Neg Hx   . Bladder Cancer Neg Hx     ALLERGIES:  has No Known Allergies.  MEDICATIONS:  Current Outpatient Medications  Medication Sig Dispense Refill  . acetaminophen (TYLENOL) 500 MG tablet Take 500 mg by mouth every 6 (six) hours as needed for mild pain or moderate pain.    Marland Kitchen amLODipine (NORVASC) 5 MG tablet Take 5 mg by mouth daily.    Marland Kitchen aspirin EC 81 MG tablet Take 81 mg by mouth daily.    Marland Kitchen ezetimibe (ZETIA) 10 MG tablet Take 1 tablet (10 mg total) by mouth daily. 90 tablet 3  . hydrALAZINE  (APRESOLINE) 25 MG tablet Take 25 mg by mouth every evening.     Marland Kitchen losartan (COZAAR) 100 MG tablet Take 100 mg by mouth every evening.    . pravastatin (PRAVACHOL) 80 MG tablet Take 1 tablet (80 mg total) by mouth every evening. 90 tablet 3  . zolpidem (AMBIEN) 10 MG tablet Take 10 mg by mouth at bedtime as needed for sleep.     Marland Kitchen zolpidem (AMBIEN) 10 MG tablet Take by mouth.     No current facility-administered medications for this visit.       Marland Kitchen  PHYSICAL EXAMINATION: ECOG PERFORMANCE STATUS: 1 - Symptomatic but completely ambulatory Vitals:   01/11/18 1032  BP: (!) 166/94  Pulse: 76  Resp: 18  Temp: (!) 97.4 F (36.3 C)   Filed Weights   01/11/18 1032  Weight: 221 lb (100.2 kg)    Physical Exam  Constitutional: He is oriented to person, place, and time and well-developed, well-nourished, and in no distress. No distress.  HENT:  Head: Normocephalic and atraumatic.  Nose: Nose normal.  Mouth/Throat: Oropharynx is clear and moist. No oropharyngeal exudate.  Eyes: Pupils are equal, round, and reactive to light. Conjunctivae and EOM are normal. Right eye exhibits no discharge. Left eye exhibits no discharge. No scleral icterus.  Neck: Normal range of motion. Neck supple. No JVD present.  Cardiovascular: Normal rate, regular rhythm and normal heart sounds. Exam reveals no gallop.  No murmur heard. Pulmonary/Chest: Effort normal and breath sounds normal. No respiratory distress. He has no wheezes. He has no rales. He exhibits no tenderness.  Abdominal: Soft. Bowel sounds are normal. He exhibits no distension and no mass. There is no tenderness. There is no rebound.  Musculoskeletal: Normal range of motion. He exhibits no edema, tenderness or deformity.  trace lower extremity swelling.   Lymphadenopathy:    He has no cervical adenopathy.  Neurological: He is alert and oriented to person, place, and time. No cranial nerve deficit. He exhibits normal muscle tone. Gait normal.  Coordination normal.  Skin: Skin is warm and dry. No rash noted. He is not diaphoretic. No erythema.  Psychiatric: Affect and judgment normal.    LABORATORY DATA:  I have reviewed the data as listed Lab Results  Component Value Date   WBC 4.3 11/09/2017   HGB 10.4 (L) 11/09/2017   HCT 30.0 (L) 11/09/2017   MCV 89.6 11/09/2017   PLT 169 11/09/2017   Recent Labs    10/05/17 0810 10/19/17 0918 11/09/17 1437  NA 137 141 140  K 3.6 3.9 3.7  CL 106 108 108  CO2 '24 24 24  ' GLUCOSE 125* 111* 91  BUN '18 12 20  ' CREATININE 0.93 0.83 0.83  CALCIUM 9.1 8.8* 9.0  GFRNONAA >60 >60 >60  GFRAA >60 >60 >60  PROT 6.9 6.4* 6.7  ALBUMIN 3.5 3.3* 3.7  AST 17 13* 19  ALT 11* 10* 15*  ALKPHOS 94 97 88  BILITOT 0.5 0.4 0.6   Hepatitis B antigen negative. 2-D echo.showed LVEF 60%   ASSESSMENT & PLAN:  This is a 72 y.o. male with stage IV diffuse large B-cell lymphoma, s/p first-line chemotherapy with R CHOP with curative intent, present for follow-up.  1. Diffuse large B-cell lymphoma of extranodal site excluding spleen and other solid organs ([place])   2. Port-A-Cath in place   3. Balance problem   4. Anemia due to antineoplastic chemotherapy   5. Fatigue associated with anemia    # DLBCL, in complete  remission.  PET scan was independently reviewed by me and discussed with patient. He has no evidence of disease recurrence or residual disease.  Discussed with patient that guideline does not recommend survellance CT or PET scan unless indicated by symptoms.  Will see him every 3-6 months for the first year for history and physical examination and labs. Patient voices understanding.    # Fatigue secondary to chemotherapy/Anemia. Improved.   # ?Balance problem: chronic, stable. I have ordered MRI brain to be done at last visit as patient complains balancing issue get worse and patient did not get it done. Last MRI brain was done in March 2019 which was independently reviewed by me and  discussed with patient. Showed no acute intracranial abnormality.  Given that this problem has been chronic, not any worse will continue to monitor.  # Medi port flushes every 6-8 weeks scheduled.  Patient agrees with plan of keeping medi port in for the first year after her finishes  - The patient knows to call the clinic with any problems questions or concerns.  Return of visit: lab (cbc, cmp, LDH) / MD in 3 months.   Earlie Server, MD, PhD Hematology Oncology [facility] at [facility] Pager- [phone] 01/11/18

## 2018-01-16 ENCOUNTER — Other Ambulatory Visit: Payer: Self-pay

## 2018-01-16 ENCOUNTER — Observation Stay
Admission: EM | Admit: 2018-01-16 | Discharge: 2018-01-17 | Disposition: A | Discharge status: Home / Self Care | Payer: Medicare HMO | Attending: Internal Medicine | Admitting: Internal Medicine

## 2018-01-16 DIAGNOSIS — T782XXA Anaphylactic shock, unspecified, initial encounter: Secondary | ICD-10-CM | POA: Diagnosis not present

## 2018-01-16 DIAGNOSIS — Y92007 Garden or yard of unspecified non-institutional (private) residence as the place of occurrence of the external cause: Secondary | ICD-10-CM | POA: Diagnosis not present

## 2018-01-16 DIAGNOSIS — I2510 Atherosclerotic heart disease of native coronary artery without angina pectoris: Secondary | ICD-10-CM | POA: Diagnosis not present

## 2018-01-16 DIAGNOSIS — G4700 Insomnia, unspecified: Secondary | ICD-10-CM | POA: Insufficient documentation

## 2018-01-16 DIAGNOSIS — Z8572 Personal history of non-Hodgkin lymphomas: Secondary | ICD-10-CM | POA: Insufficient documentation

## 2018-01-16 DIAGNOSIS — Z9221 Personal history of antineoplastic chemotherapy: Secondary | ICD-10-CM | POA: Diagnosis not present

## 2018-01-16 DIAGNOSIS — Z7982 Long term (current) use of aspirin: Secondary | ICD-10-CM | POA: Insufficient documentation

## 2018-01-16 DIAGNOSIS — M109 Gout, unspecified: Secondary | ICD-10-CM | POA: Insufficient documentation

## 2018-01-16 DIAGNOSIS — M1990 Unspecified osteoarthritis, unspecified site: Secondary | ICD-10-CM | POA: Insufficient documentation

## 2018-01-16 DIAGNOSIS — T63421A Toxic effect of venom of ants, accidental (unintentional), initial encounter: Secondary | ICD-10-CM | POA: Diagnosis not present

## 2018-01-16 DIAGNOSIS — Z87891 Personal history of nicotine dependence: Secondary | ICD-10-CM | POA: Insufficient documentation

## 2018-01-16 DIAGNOSIS — I10 Essential (primary) hypertension: Secondary | ICD-10-CM | POA: Diagnosis not present

## 2018-01-16 DIAGNOSIS — E876 Hypokalemia: Secondary | ICD-10-CM | POA: Insufficient documentation

## 2018-01-16 DIAGNOSIS — R079 Chest pain, unspecified: Secondary | ICD-10-CM | POA: Diagnosis not present

## 2018-01-16 DIAGNOSIS — E785 Hyperlipidemia, unspecified: Secondary | ICD-10-CM | POA: Insufficient documentation

## 2018-01-16 DIAGNOSIS — Z79899 Other long term (current) drug therapy: Secondary | ICD-10-CM | POA: Diagnosis not present

## 2018-01-16 DIAGNOSIS — Z91038 Other insect allergy status: Secondary | ICD-10-CM

## 2018-01-16 DIAGNOSIS — Z91030 Bee allergy status: Secondary | ICD-10-CM | POA: Diagnosis not present

## 2018-01-16 LAB — CBC WITH DIFFERENTIAL/PLATELET
BASOS PCT: 0 %
Basophils Absolute: 0 10*3/uL (ref 0–0.1)
EOS ABS: 0.1 10*3/uL (ref 0–0.7)
Eosinophils Relative: 1 %
HEMATOCRIT: 41 % (ref 40.0–52.0)
HEMOGLOBIN: 14.1 g/dL (ref 13.0–18.0)
Lymphocytes Relative: 15 %
Lymphs Abs: 1.1 10*3/uL (ref 1.0–3.6)
MCH: 29.6 pg (ref 26.0–34.0)
MCHC: 34.4 g/dL (ref 32.0–36.0)
MCV: 86.1 fL (ref 80.0–100.0)
MONOS PCT: 11 %
Monocytes Absolute: 0.8 10*3/uL (ref 0.2–1.0)
NEUTROS ABS: 5 10*3/uL (ref 1.4–6.5)
NEUTROS PCT: 73 %
Platelets: 236 10*3/uL (ref 150–440)
RBC: 4.76 MIL/uL (ref 4.40–5.90)
RDW: 14.3 % (ref 11.5–14.5)
WBC: 6.9 10*3/uL (ref 3.8–10.6)

## 2018-01-16 LAB — COMPREHENSIVE METABOLIC PANEL
ALK PHOS: 97 U/L (ref 38–126)
ALT: 13 U/L — AB (ref 17–63)
ANION GAP: 12 (ref 5–15)
AST: 23 U/L (ref 15–41)
Albumin: 4.1 g/dL (ref 3.5–5.0)
BUN: 18 mg/dL (ref 6–20)
CALCIUM: 9.4 mg/dL (ref 8.9–10.3)
CHLORIDE: 106 mmol/L (ref 101–111)
CO2: 22 mmol/L (ref 22–32)
CREATININE: 1.02 mg/dL (ref 0.61–1.24)
Glucose, Bld: 159 mg/dL — ABNORMAL HIGH (ref 65–99)
Potassium: 2.9 mmol/L — ABNORMAL LOW (ref 3.5–5.1)
SODIUM: 140 mmol/L (ref 135–145)
Total Bilirubin: 1.1 mg/dL (ref 0.3–1.2)
Total Protein: 7.5 g/dL (ref 6.5–8.1)

## 2018-01-16 LAB — POTASSIUM: POTASSIUM: 3 mmol/L — AB (ref 3.5–5.1)

## 2018-01-16 LAB — PHOSPHORUS: Phosphorus: 3 mg/dL (ref 2.5–4.6)

## 2018-01-16 LAB — MAGNESIUM: MAGNESIUM: 1.8 mg/dL (ref 1.7–2.4)

## 2018-01-16 MED ORDER — FAMOTIDINE IN NACL 20-0.9 MG/50ML-% IV SOLN
20.0000 mg | Freq: Once | INTRAVENOUS | Status: AC
  Administered 2018-01-16: 20 mg via INTRAVENOUS
  Filled 2018-01-16: qty 50

## 2018-01-16 MED ORDER — POTASSIUM CHLORIDE CRYS ER 20 MEQ PO TBCR
40.0000 meq | EXTENDED_RELEASE_TABLET | Freq: Once | ORAL | Status: AC
  Administered 2018-01-16: 40 meq via ORAL
  Filled 2018-01-16: qty 2

## 2018-01-16 MED ORDER — AMLODIPINE BESYLATE 5 MG PO TABS
5.0000 mg | ORAL_TABLET | Freq: Every day | ORAL | Status: DC
  Administered 2018-01-16: 5 mg via ORAL
  Filled 2018-01-16: qty 1

## 2018-01-16 MED ORDER — DIPHENHYDRAMINE HCL 50 MG/ML IJ SOLN
50.0000 mg | Freq: Once | INTRAMUSCULAR | Status: AC
  Administered 2018-01-16: 50 mg via INTRAVENOUS
  Filled 2018-01-16: qty 1

## 2018-01-16 MED ORDER — PRAVASTATIN SODIUM 40 MG PO TABS
80.0000 mg | ORAL_TABLET | Freq: Every evening | ORAL | Status: DC
  Administered 2018-01-16: 80 mg via ORAL
  Filled 2018-01-16 (×2): qty 2
  Filled 2018-01-16: qty 4

## 2018-01-16 MED ORDER — EPINEPHRINE 0.3 MG/0.3ML IJ SOAJ
0.3000 mg | Freq: Once | INTRAMUSCULAR | Status: AC
  Administered 2018-01-16: 0.3 mg via INTRAMUSCULAR

## 2018-01-16 MED ORDER — DOCUSATE SODIUM 100 MG PO CAPS
100.0000 mg | ORAL_CAPSULE | Freq: Two times a day (BID) | ORAL | Status: DC
  Filled 2018-01-16: qty 1

## 2018-01-16 MED ORDER — ZOLPIDEM TARTRATE 5 MG PO TABS
5.0000 mg | ORAL_TABLET | Freq: Every evening | ORAL | Status: DC | PRN
  Administered 2018-01-17: 5 mg via ORAL
  Filled 2018-01-16: qty 1

## 2018-01-16 MED ORDER — FAMOTIDINE 20 MG PO TABS
20.0000 mg | ORAL_TABLET | Freq: Two times a day (BID) | ORAL | Status: DC
  Administered 2018-01-16: 20 mg via ORAL
  Filled 2018-01-16: qty 1

## 2018-01-16 MED ORDER — HYDRALAZINE HCL 25 MG PO TABS
25.0000 mg | ORAL_TABLET | Freq: Every evening | ORAL | Status: DC
  Administered 2018-01-16: 25 mg via ORAL
  Filled 2018-01-16: qty 1

## 2018-01-16 MED ORDER — SODIUM CHLORIDE 0.9 % IV BOLUS
1000.0000 mL | Freq: Once | INTRAVENOUS | Status: AC
  Administered 2018-01-16: 1000 mL via INTRAVENOUS

## 2018-01-16 MED ORDER — EPINEPHRINE 0.3 MG/0.3ML IJ SOAJ
INTRAMUSCULAR | Status: AC
  Filled 2018-01-16: qty 0.3

## 2018-01-16 MED ORDER — PREDNISONE 50 MG PO TABS
50.0000 mg | ORAL_TABLET | Freq: Every day | ORAL | Status: DC
Start: 2018-01-17 — End: 2018-01-17
  Administered 2018-01-17: 50 mg via ORAL
  Filled 2018-01-16: qty 1

## 2018-01-16 MED ORDER — EPINEPHRINE 0.3 MG/0.3ML IJ SOAJ
INTRAMUSCULAR | Status: AC
  Administered 2018-01-16: 0.3 mg via INTRAMUSCULAR
  Filled 2018-01-16: qty 0.3

## 2018-01-16 MED ORDER — ONDANSETRON HCL 4 MG PO TABS: 4.0000 mg | ORAL_TABLET | Freq: Four times a day (QID) | ORAL | Status: DC | PRN

## 2018-01-16 MED ORDER — LOSARTAN POTASSIUM 50 MG PO TABS
100.0000 mg | ORAL_TABLET | Freq: Every evening | ORAL | Status: DC
  Administered 2018-01-16: 100 mg via ORAL
  Filled 2018-01-16: qty 2

## 2018-01-16 MED ORDER — DIPHENHYDRAMINE HCL 25 MG PO CAPS: 25.0000 mg | ORAL_CAPSULE | Freq: Four times a day (QID) | ORAL | Status: DC | PRN

## 2018-01-16 MED ORDER — EZETIMIBE 10 MG PO TABS
10.0000 mg | ORAL_TABLET | Freq: Every day | ORAL | Status: DC
  Filled 2018-01-16 (×2): qty 1

## 2018-01-16 MED ORDER — ACETAMINOPHEN 500 MG PO TABS
500.0000 mg | ORAL_TABLET | Freq: Four times a day (QID) | ORAL | Status: DC | PRN
Start: 2018-01-16 — End: 2018-01-17

## 2018-01-16 MED ORDER — ASPIRIN EC 81 MG PO TBEC
81.0000 mg | DELAYED_RELEASE_TABLET | Freq: Every day | ORAL | Status: DC
  Administered 2018-01-16: 81 mg via ORAL
  Filled 2018-01-16: qty 1

## 2018-01-16 MED ORDER — POTASSIUM CHLORIDE CRYS ER 20 MEQ PO TBCR: 40.0000 meq | EXTENDED_RELEASE_TABLET | ORAL | Status: DC

## 2018-01-16 MED ORDER — ONDANSETRON HCL 4 MG/2ML IJ SOLN: 4.0000 mg | Freq: Four times a day (QID) | INTRAMUSCULAR | Status: DC | PRN

## 2018-01-16 MED ORDER — ENOXAPARIN SODIUM 40 MG/0.4ML ~~LOC~~ SOLN
40.0000 mg | SUBCUTANEOUS | Status: DC
  Administered 2018-01-16: 40 mg via SUBCUTANEOUS
  Filled 2018-01-16: qty 0.4

## 2018-01-16 MED ORDER — METHYLPREDNISOLONE SODIUM SUCC 125 MG IJ SOLR
125.0000 mg | Freq: Once | INTRAMUSCULAR | Status: AC
  Administered 2018-01-16: 125 mg via INTRAVENOUS
  Filled 2018-01-16: qty 2

## 2018-01-16 NOTE — H&P (Signed)
[place] [facility] [place] NAME: John Gallegos    MR#:  [id number]  DATE OF BIRTH:  [birth year]  DATE OF ADMISSION:  01/16/2018  PRIMARY CARE PHYSICIAN: Tracie Harrier, MD   REQUESTING/REFERRING PHYSICIAN: Darel Hong, MD  CHIEF COMPLAINT:   Chest tightness, urticaria HISTORY OF PRESENT ILLNESS:  John Gallegos  is a 72 y.o. male with a known history of essential hypertension, hyperlipidemia, non-Hodgkin's B-cell lymphoma and, insomnia came to the ED with chest tightness and shortness of breath after he got bit by several 100s of fire ants while he was checking his mail Following that he developed itching rash and suddenly became short of breath with chest tightness.  Patient came into the emergency department and his lower lip was quite swollen at the time.  Patient was given EpiPen x2, Solu-Medrol, Pepcid and Benadryl and poison control team was consulted who has recommended 24 hours of close observation.  During my examination patient's lip swelling was resolved and denies any chest tightness or shortness of breath.  No similar complaints in the past  PAST MEDICAL HISTORY:   Past Medical History:  Diagnosis Date  . Cancer ([place])    non hodgkin Bcell lymphoma  . Gout   . Hard of hearing   . Hiatal hernia   . Hypertension   . Urinary incontinence     PAST SURGICAL HISTOIRY:   Past Surgical History:  Procedure Laterality Date  . CYSTOSCOPY W/ RETROGRADES Left 08/19/2017   Procedure: CYSTOSCOPY WITH RETROGRADE PYELOGRAM;  Surgeon: Abbie Sons, MD;  Location: [facility];  Service: Urology;  Laterality: Left;  . CYSTOSCOPY W/ URETERAL STENT REMOVAL Left 08/19/2017   Procedure: CYSTOSCOPY WITH STENT REMOVAL;  Surgeon: Abbie Sons, MD;  Location: [facility];  Service: Urology;  Laterality: Left;  . CYSTOSCOPY WITH BIOPSY Left 05/13/2017   Procedure: CYSTOSCOPY WITH URETERAL BIOPSY;  Surgeon: Nickie Retort, MD;   Location: [facility];  Service: Urology;  Laterality: Left;  . CYSTOSCOPY WITH STENT PLACEMENT Left 05/13/2017   Procedure: CYSTOSCOPY WITH STENT PLACEMENT;  Surgeon: Nickie Retort, MD;  Location: [facility];  Service: Urology;  Laterality: Left;  . PORTA CATH INSERTION N/A 06/14/2017   Procedure: PORTA CATH INSERTION;  Surgeon: Algernon Huxley, MD;  Location: [place] [facility];  Service: Cardiovascular;  Laterality: N/A;  . pyleostenosis     31 weeks old  . right knne surgery     needle went through knee  . TONSILLECTOMY    . URETEROSCOPY Left 05/13/2017   Procedure: URETEROSCOPY;  Surgeon: Nickie Retort, MD;  Location: [facility];  Service: Urology;  Laterality: Left;    SOCIAL HISTORY:   Social History   Tobacco Use  . Smoking status: Former Smoker    Packs/day: 0.50    Years: 50.00    Pack years: 25.00    Types: Cigarettes  . Smokeless tobacco: Never Used  Substance Use Topics  . Alcohol use: Yes    Alcohol/week: 1.8 oz    Types: 3 Shots of liquor per week    Comment: occ. beer    FAMILY HISTORY:   Family History  Problem Relation Age of Onset  . Pancreatic cancer Mother   . Aneurysm Mother   . Heart attack Mother   . Lung cancer Father   . Lymphoma Sister   . Prostate cancer Neg Hx   . Kidney cancer Neg Hx   . Bladder Cancer Neg Hx  DRUG ALLERGIES:  No Known Allergies  REVIEW OF SYSTEMS:  CONSTITUTIONAL: No fever, fatigue or weakness.  EYES: No blurred or double vision.  EARS, NOSE, AND THROAT: No tinnitus or ear pain.  RESPIRATORY: No cough, shortness of breath, wheezing or hemoptysis.  CARDIOVASCULAR: No chest pain, orthopnea, edema.  GASTROINTESTINAL: No nausea, vomiting, diarrhea or abdominal pain.  GENITOURINARY: No dysuria, hematuria.  ENDOCRINE: No polyuria, nocturia,  HEMATOLOGY: No anemia, easy bruising or bleeding SKIN: rash in finger and toe webs MUSCULOSKELETAL: No joint pain or arthritis.   NEUROLOGIC: No tingling, numbness,  weakness.  PSYCHIATRY: No anxiety or depression.   MEDICATIONS AT HOME:   Prior to Admission medications   Medication Sig Start Date End Date Taking? Authorizing Provider  acetaminophen (TYLENOL) 500 MG tablet Take 500 mg by mouth every 6 (six) hours as needed for mild pain or moderate pain.   Yes [provider]  amLODipine (NORVASC) 5 MG tablet Take 5 mg by mouth daily.   Yes [provider]  aspirin EC 81 MG tablet Take 81 mg by mouth daily.   Yes [provider]  hydrALAZINE (APRESOLINE) 25 MG tablet Take 25 mg by mouth every evening.    Yes [provider]  losartan (COZAAR) 100 MG tablet Take 100 mg by mouth every evening.   Yes [provider]  pravastatin (PRAVACHOL) 80 MG tablet Take 1 tablet (80 mg total) by mouth every evening. 08/29/17  Yes Gollan, Kathlene November, MD  zolpidem (AMBIEN) 10 MG tablet Take 10 mg by mouth at bedtime as needed for sleep.  02/10/17  Yes [provider]  ezetimibe (ZETIA) 10 MG tablet Take 1 tablet (10 mg total) by mouth daily. Patient not taking: Reported on 01/16/2018 08/29/17   Minna Merritts, MD      VITAL SIGNS:  Blood pressure (!) 164/94, pulse 74, temperature 97.8 F (36.6 C), resp. rate (!) 21, height 6\' 3"  (1.905 m), weight 100.2 kg (221 lb), SpO2 96 %.  PHYSICAL EXAMINATION:  GENERAL:  72 y.o.-year-old patient lying in the bed with no acute distress.  EYES: Pupils equal, round, reactive to light and accommodation. No scleral icterus. Extraocular muscles intact.  HEENT: Head atraumatic, normocephalic. Oropharynx and nasopharynx clear.  NECK:  Supple, no jugular venous distention. No thyroid enlargement, no tenderness.  LUNGS: Normal breath sounds bilaterally, no wheezing, rales,rhonchi or crepitation. No use of accessory muscles of respiration.  CARDIOVASCULAR: S1, S2 normal. No murmurs, rubs, or gallops.  ABDOMEN: Soft, nontender, nondistended. Bowel sounds present. No organomegaly or mass.   EXTREMITIES: No pedal edema, cyanosis, or clubbing.  NEUROLOGIC: Cranial nerves II through XII are intact. Muscle strength 5/5 in all extremities. Sensation intact. Gait not checked.  PSYCHIATRIC: The patient is alert and oriented x 3.  SKIN: No obvious rash, lesion, or ulcer.   LABORATORY PANEL:   CBC Recent Labs  Lab 01/16/18 1458  WBC 6.9  HGB 14.1  HCT 41.0  PLT 236   ------------------------------------------------------------------------------------------------------------------  Chemistries  Recent Labs  Lab 01/16/18 1458  NA 140  K 2.9*  CL 106  CO2 22  GLUCOSE 159*  BUN 18  CREATININE 1.02  CALCIUM 9.4  AST 23  ALT 13*  ALKPHOS 97  BILITOT 1.1   ------------------------------------------------------------------------------------------------------------------  Cardiac Enzymes No results for input(s): TROPONINI in the last 168 hours. ------------------------------------------------------------------------------------------------------------------  RADIOLOGY:  No results found.  EKG:   Orders placed or performed during the hospital encounter of 01/16/18  . ED EKG within 10  minutes  . ED EKG within 10 minutes    IMPRESSION AND PLAN:   Omarii Scalzo  is a 73 y.o. male with a known history of essential hypertension, hyperlipidemia, non-Hodgkin's B-cell lymphoma and, insomnia came to the ED with chest tightness and shortness of breath after he got bit by several 100s of fire ants while he was checking his mail Following that he developed itching rash and suddenly became short of breath with chest tightness.  Patient came into the emergency department and his lower lip was quite swollen at the time.  Patient was given EpiPen x2, Solu-Medrol, Pepcid and Benadryl and poison control team was consulted who has recommended 24 hours of close observation  #Anaphylactic reaction from several fire ant bites Admit to [place] unit for observation  overnight Patient has received 2 doses of EpiPen, Benadryl, Solu-Medrol, Pepcid We will continue close monitoring Continue Pepcid, Benadryl as needed and prednisone Poison control has recommended overnight observation for 24 hours total  #Hypokalemia Replete and recheck in a.m.  Check magnesium as well  #Hypertension Blood pressure is elevated, resume his home medication Norvasc, Cozaar, hydralazine and titrate as needed  #Hyperlipidemia continue statin  #History of non-Hodgkin's B-cell lymphoma-status post chemotherapy outpatient follow-up with Dr. Tasia Catchings as recommended  #Insomnia continue home medication Ambien   #GI prophylaxis with Pepcid DVT prophylaxis with Lovenox subcu   All the records are reviewed and case discussed with ED provider. Management plans discussed with the patient, family and they are in agreement.  CODE STATUS: FC ,WIFE HCPOA  TOTAL TIME TAKING CARE OF THIS PATIENT: 41  minutes.   Note: This dictation was prepared with Dragon dictation along with smaller phrase technology. Any transcriptional errors that result from this process are unintentional.  Nicholes Mango M.D on 01/16/2018 at 5:48 PM  Between 7am to 6pm - Pager - [phone]  After 6pm go to [email or web address] - password [email or web address] [place] [facility]  Office  [phone]  CC: Primary care physician; Tracie Harrier, MD

## 2018-01-16 NOTE — ED Triage Notes (Signed)
Pt arrives to ED c/o central CP, a little trouble breathing, pt is perfusly diaphoretic and covered in hives. States he got bit by a "swarm" ants while getting the mail. Unable to tell what type of ants they were. Pt c/o that his body is itchy. C/o lip swelling as well. Noticed swelling to top and bottom lips. Denies allergies. Alert. Had to be helped from wheelchair to stretcher.

## 2018-01-16 NOTE — Progress Notes (Addendum)
MEDICATION RELATED CONSULT NOTE - INITIAL   Pharmacy Consult for electrolyte management Indication: hypokalemia  No Known Allergies  Patient Measurements: Height: 6\' 3"  (190.5 cm) Weight: 221 lb (100.2 kg) IBW/kg (Calculated) : 84.5 Adjusted Body Weight:   Vital Signs: Temp: 97.8 F (36.6 C) (06/10 1452) BP: 162/87 (06/10 1806) Pulse Rate: 66 (06/10 1806) Intake/Output from previous day: No intake/output data recorded. Intake/Output from this shift: Total I/O In: 1000 [IV Piggyback:1000] Out: -   Labs: Recent Labs    01/16/18 1458  WBC 6.9  HGB 14.1  HCT 41.0  PLT 236  CREATININE 1.02  ALBUMIN 4.1  PROT 7.5  AST 23  ALT 13*  ALKPHOS 97  BILITOT 1.1   Estimated Creatinine Clearance: 78.2 mL/min (by C-G formula based on SCr of 1.02 mg/dL).   Microbiology: No results found for this or any previous visit (from the past 720 hour(s)).  Medical History: Past Medical History:  Diagnosis Date  . Cancer ([place])    non hodgkin Bcell lymphoma  . Gout   . Hard of hearing   . Hiatal hernia   . Hypertension   . Urinary incontinence     Medications:  Infusions:    Assessment: 42 yom with anaphylactic reaction, chest tightness and SOB. PMH HTN, HLD, insomnia, non-Hodgkin's B-cell lymphoma, gout. Pharmacy consulted to manage electrolytes  Goal of Therapy:  K 3.5 to 5 Ca 8.9 to 10.3 Mg 1.7 to 2.4 Phos 2.5 to 4.6  Plan:  K 2.9, Ca 9.4. Check add-on Mg/Phos. Will give Klor-Con 40 mEq po Q4H x 2 doses this evening (hospitalist ordered 40 mEq po x 1 and I ordered additional 40 mEq po x 1 at 2200) and recheck all electrolytes tomorrow with AM labs.  Laural Benes, Pharm.D., BCPS Clinical Pharmacist 01/16/2018,6:10 PM

## 2018-01-16 NOTE — Progress Notes (Signed)
Family Meeting Note  Advance Directive:yes  Today a meeting took place with the Patient.     The following clinical team members were present during this meeting:MD  The following were discussed:Patient's diagnosis: Anaphylactic reaction, chest tightness associated with shortness of breath, hypokalemia treatment plan of care discussed in detail with the patient.  Comorbidities essential hypertension, hyperlipidemia, insomnia, non-Hodgkin's B-cell lymphoma, gout  are discussed with the patient.  Patient is agreeable with the plan     patient's progosis: > 12 months and Goals for treatment: Full Code, wife is a healthcare power of attorney  Additional follow-up to be provided: MD  Time spent during discussion:17 MIN  Nicholes Mango, MD

## 2018-01-16 NOTE — ED Provider Notes (Signed)
[facility] Emergency Department Provider Note  ____________________________________________   First MD Initiated Contact with Patient 01/16/18 1456     (approximate)  I have reviewed the triage vital signs and the nursing notes.   HISTORY  Chief Complaint Urticaria and Chest Pain   HPI John Gallegos is a 72 y.o. male who self presents to the emergency department with chest pain shortness of breath and itching rash that began suddenly when he was checking the mail shortly prior to arrival.  He noticed that there were several 100 and sound him that bit him all at once and he nearly immediately began to feel short of breath with itching.  He has no known history of allergies.  His symptoms began suddenly and have been constant.  There is severe.  His chest pain is nonradiating.  Past Medical History:  Diagnosis Date  . Cancer ([place])    non hodgkin Bcell lymphoma  . Gout   . Hard of hearing   . Hiatal hernia   . Hypertension   . Urinary incontinence     Patient Active Problem List   Diagnosis Date Noted  . CAD (coronary artery disease), native coronary artery 08/29/2017  . Chest pain with moderate risk for cardiac etiology 08/27/2017  . Smoker 08/27/2017  . 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 B-cell lymphoma of lymph nodes of multiple regions ([place]) 06/17/2017  . Arthritis 05/10/2017  . Hyperlipidemia, unspecified 05/10/2017  . Hypertension 05/10/2017  . Insomnia 05/10/2017    Past Surgical History:  Procedure Laterality Date  . CYSTOSCOPY W/ RETROGRADES Left 08/19/2017   Procedure: CYSTOSCOPY WITH RETROGRADE PYELOGRAM;  Surgeon: Abbie Sons, MD;  Location: [facility] ORS;  Service: Urology;  Laterality: Left;  . CYSTOSCOPY W/ URETERAL STENT REMOVAL Left 08/19/2017   Procedure: CYSTOSCOPY WITH STENT REMOVAL;  Surgeon: Abbie Sons, MD;  Location: [facility] OR[facility];  Service:  Urology;  Laterality: Left;  . CYSTOSCOPY WITH BIOPSY Left 05/13/2017   Procedure: CYSTOSCOPY WITH URETERAL BIOPSY;  Surgeon: Nickie Retort, MD;  Location: [facility] ORS;  Service: Urology;  Laterality: Left;  . CYSTOSCOPY WITH STENT PLACEMENT Left 05/13/2017   Procedure: CYSTOSCOPY WITH STENT PLACEMENT;  Surgeon: Nickie Retort, MD;  Location: [facility] OR[facility];  Service: Urology;  Laterality: Left;  . PORTA CATH INSERTION N/A 06/14/2017   Procedure: PORTA CATH INSERTION;  Surgeon: Algernon Huxley, MD;  Location: [place] [facility];  Service: Cardiovascular;  Laterality: N/A;  . 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 right knne surgery     needle went through knee  . TONSILLECTOMY    . URETEROSCOPY Left 05/13/2017   Procedure: URETEROSCOPY;  Surgeon: Nickie Retort, MD;  Location: [facility] OR[facility];  Service: Urology;  Laterality: Left;    Prior to Admission medications   Medication Sig Start Date End Date Taking? Authorizing Provider  acetaminophen (TYLENOL) 500 MG tablet Take 500 mg by mouth every 6 (six) hours as needed for mild pain or moderate pain.    [provider]  amLODipine (NORVASC) 5 MG tablet Take 5 mg by mouth daily.    [provider]  aspirin EC 81 MG tablet Take 81 mg by mouth daily.    [provider]  ezetimibe (ZETIA) 10 MG tablet Take 1 tablet (10 mg total) by mouth daily. 08/29/17   Minna Merritts, MD  hydrALAZINE (APRESOLINE) 25 MG tablet Take 25  mg by mouth every evening.     [provider]  losartan (COZAAR) 100 MG tablet Take 100 mg by mouth every evening.    [provider]  pravastatin (PRAVACHOL) 80 MG tablet Take 1 tablet (80 mg total) by mouth every evening. 08/29/17   Gollan, Kathlene November, MD  zolpidem (AMBIEN) 10 MG tablet Take 10 mg by mouth at bedtime as needed for sleep.  02/10/17   [provider]  zolpidem (AMBIEN) 10 MG tablet Take by mouth. 10/07/17   [provider]    Allergies Patient has no known  allergies.  Family History  Problem Relation Age of Onset  . Pancreatic cancer Mother   . Aneurysm Mother   . Heart attack Mother   . Lung cancer Father   . Lymphoma Sister   . Prostate cancer Neg Hx   . Kidney cancer Neg Hx   . Bladder Cancer Neg Hx     Social History Social History   Tobacco Use  . Smoking status: Former Smoker    Packs/day: 0.50    Years: 50.00    Pack years: 25.00    Types: Cigarettes  . Smokeless tobacco: Never Used  Substance Use Topics  . Alcohol use: Yes    Alcohol/week: 1.8 oz    Types: 3 Shots of liquor per week    Comment: occ. beer  . Drug use: No    Review of Systems Constitutional: No fever/chills Eyes: No visual changes. ENT: No sore throat. Cardiovascular: Positive for chest pain. Respiratory: Positive for shortness of breath. Gastrointestinal: No abdominal pain.  Positive for nausea, no vomiting.  No diarrhea.  No constipation. Genitourinary: Negative for dysuria. Musculoskeletal: Negative for back pain. Skin: Positive for rash. Neurological: Negative for headaches, focal weakness or numbness.   ____________________________________________   PHYSICAL EXAM:  VITAL SIGNS: ED Triage Vitals  Enc Vitals Group     BP 01/16/18 1452 140/83     Pulse Rate 01/16/18 1452 72     Resp 01/16/18 1452 20     Temp 01/16/18 1452 97.8 F (36.6 C)     Temp src --      SpO2 01/16/18 1452 95 %     Weight 01/16/18 1454 221 lb (100.2 kg)     Height 01/16/18 1454 6\' 3"  (1.905 m)     Head Circumference --      Peak Flow --      Pain Score 01/16/18 1454 5     Pain Loc --      Pain Edu? --      Excl. in [place]? --     Constitutional: Appears critically ill diaphoretic unable to stand up on his own requiring 2 people to get him out of the chair Eyes: PERRL EOMI. midrange Head: Atraumatic. Nose: No congestion/rhinnorhea. Mouth/Throat: Somewhat hoarse voice Neck: No stridor.   Cardiovascular: Tachycardic rate, regular rhythm. Grossly normal  heart sounds.  Good peripheral circulation. Respiratory: Increased respiratory effort with mild wheeze throughout Gastrointestinal: Soft nontender Musculoskeletal: No lower extremity edema   Neurologic:  Normal speech and language. No gross focal neurologic deficits are appreciated. Skin: Diaphoretic.  Electrodes can barely stick to his skin.  Diffuse urticaria blanching over most of his body Psychiatric: Anxious appearing    ____________________________________________   DIFFERENTIAL includes but not limited to  Hymenoptera envenomation, anaphylaxis, allergic reaction ____________________________________________   LABS (all labs ordered are listed, but only abnormal results are displayed)  Labs Reviewed  COMPREHENSIVE METABOLIC PANEL - Abnormal;  Notable for the following components:      Result Value   Potassium 2.9 (*)    Glucose, Bld 159 (*)    ALT 13 (*)    All other components within normal limits  CBC WITH DIFFERENTIAL/PLATELET    Lab work reviewed by me with no acute disease __________________________________________  EKG    ____________________________________________  RADIOLOGY   ____________________________________________   PROCEDURES  Procedure(s) performed: no  .Critical Care Performed by: Darel Hong, MD Authorized by: Darel Hong, MD   Critical care provider statement:    Critical care time (minutes):  35   Critical care time was exclusive of:  Separately billable procedures and treating other patients   Critical care was necessary to treat or prevent imminent or life-threatening deterioration of the following conditions: anaphylaxis.   Critical care was time spent personally by me on the following activities:  Development of treatment plan with patient or surrogate, discussions with consultants, evaluation of patient's response to treatment, examination of patient, obtaining history from patient or surrogate, ordering and performing  treatments and interventions, ordering and review of laboratory studies, ordering and review of radiographic studies, pulse oximetry, re-evaluation of patient's condition and review of old charts    Critical Care performed: Yes  Observation: no ____________________________________________   INITIAL IMPRESSION / ASSESSMENT AND PLAN / ED COURSE  Pertinent labs & imaging results that were available during my care of the patient were reviewed by me and considered in my medical decision making (see chart for details).  On arrival the patient was ill-appearing diaphoretic and short of breath with diffuse urticaria after what sounds like a massive fire ant envenomation.  I will give him a epinephrine Solu-Medrol fluids Benadryl and Pepcid now and then reevaluate.  After 1 dose of epinephrine the patient's symptoms are not resolved so I will give him a second dose now.    ----------------------------------------- 3:32 PM on 01/16/2018 -----------------------------------------  Feels improved after second dose of epinephrine.  I will reach out to [facility] control now. ____________________________________________ ----------------------------------------- 5:17 PM on 01/16/2018 -----------------------------------------  I spoke with [facility] control who indicated that as the patient had profound anaphylaxis requiring multiple doses of epinephrine he requires prolonged observation and inpatient admission.  The patient verbalizes understanding agree with the plan.  I discussed with the hospitalist who has graciously agreed to admit the patient to his service.  FINAL CLINICAL IMPRESSION(S) / ED DIAGNOSES  Final diagnoses:  Anaphylaxis, initial encounter  Hymenoptera allergy      NEW MEDICATIONS STARTED DURING THIS VISIT:  New Prescriptions   No medications on file     Note:  This document was prepared using Dragon voice recognition software and may include unintentional  dictation errors.     Darel Hong, MD 01/16/18 1710

## 2018-01-16 NOTE — ED Notes (Signed)
Epi pen given L thigh at this time.

## 2018-01-16 NOTE — Progress Notes (Signed)
Patient ordered zolpidem 10mg  QHS. Per protocol for all male patients >72 years old, zolpidem transitioned to 5mg  QHS.

## 2018-01-17 DIAGNOSIS — R079 Chest pain, unspecified: Secondary | ICD-10-CM | POA: Diagnosis not present

## 2018-01-17 DIAGNOSIS — E876 Hypokalemia: Secondary | ICD-10-CM | POA: Diagnosis not present

## 2018-01-17 DIAGNOSIS — T782XXA Anaphylactic shock, unspecified, initial encounter: Secondary | ICD-10-CM | POA: Diagnosis not present

## 2018-01-17 DIAGNOSIS — I10 Essential (primary) hypertension: Secondary | ICD-10-CM | POA: Diagnosis not present

## 2018-01-17 LAB — BASIC METABOLIC PANEL
ANION GAP: 7 (ref 5–15)
BUN: 22 mg/dL — ABNORMAL HIGH (ref 6–20)
CHLORIDE: 108 mmol/L (ref 101–111)
CO2: 25 mmol/L (ref 22–32)
CREATININE: 0.92 mg/dL (ref 0.61–1.24)
Calcium: 9 mg/dL (ref 8.9–10.3)
GFR calc Af Amer: >60 mL/min (ref >60)
GFR calc non Af Amer: >60 mL/min (ref >60)
GLUCOSE: 138 mg/dL — AB (ref 65–99)
Potassium: 4 mmol/L (ref 3.5–5.1)
Sodium: 140 mmol/L (ref 135–145)

## 2018-01-17 LAB — MAGNESIUM: Magnesium: 2 mg/dL (ref 1.7–2.4)

## 2018-01-17 MED ORDER — PREDNISONE 10 MG PO TABS: 10.0000 mg | ORAL_TABLET | Freq: Every day | ORAL | 0 refills | Status: DC

## 2018-01-17 MED ORDER — DIPHENHYDRAMINE HCL 50 MG PO TABS: 25.0000 mg | ORAL_TABLET | Freq: Three times a day (TID) | ORAL | 0 refills | Status: DC | PRN

## 2018-01-17 NOTE — Progress Notes (Signed)
[name] to be D/C'd Home per MD order.  Discussed prescriptions and follow up appointments with the patient. Prescriptions given to patient, medication list explained in detail. Pt verbalized understanding.  Allergies as of 01/17/2018   No Known Allergies     Medication List    STOP taking these medications   ezetimibe 10 MG tablet Commonly known as:  ZETIA     TAKE these medications   acetaminophen 500 MG tablet Commonly known as:  TYLENOL Take 500 mg by mouth every 6 (six) hours as needed for mild pain or moderate pain.   amLODipine 5 MG tablet Commonly known as:  NORVASC Take 5 mg by mouth daily.   aspirin EC 81 MG tablet Take 81 mg by mouth daily.   diphenhydrAMINE 50 MG tablet Commonly known as:  BENADRYL Take 0.5 tablets (25 mg total) by mouth every 8 (eight) hours as needed for itching or allergies.   hydrALAZINE 25 MG tablet Commonly known as:  APRESOLINE Take 25 mg by mouth every evening.   losartan 100 MG tablet Commonly known as:  COZAAR Take 100 mg by mouth every evening.   pravastatin 80 MG tablet Commonly known as:  PRAVACHOL Take 1 tablet (80 mg total) by mouth every evening.   predniSONE 10 MG tablet Commonly known as:  DELTASONE Take 1 tablet (10 mg total) by mouth daily. Label  & dispense according to the schedule below.  6 tablets day one, then 5 table day 2, then 4 tablets day 3, then 3 tablets day 4, 2 tablets day 5, then 1 tablet day 6, then stop   zolpidem 10 MG tablet Commonly known as:  AMBIEN Take 10 mg by mouth at bedtime as needed for sleep.       Vitals:   01/17/18 0441 01/17/18 0520  BP:  (!) 144/79  Pulse: (!) 48 (!) 55  Resp:  20  Temp:  97.7 F (36.5 C)  SpO2:  94%    Skin clean, dry and intact without evidence of skin break down, no evidence of skin tears noted. IV catheter discontinued intact. Site without signs and symptoms of complications. Dressing and pressure applied. Pt denies pain at this time. No  complaints noted.  An After Visit Summary was printed and given to the patient. Patient  D/C home via private auto with wife.  John Gallegos

## 2018-01-17 NOTE — Discharge Summary (Signed)
[facility] NAME: John Gallegos    MR#:  [id number]  DATE OF BIRTH:  1945-10-04  DATE OF ADMISSION:  01/16/2018 ADMITTING PHYSICIAN: Nicholes Mango, MD  DATE OF DISCHARGE: 01/17/2018  1:31 PM  PRIMARY CARE PHYSICIAN: Tracie Harrier, MD   ADMISSION DIAGNOSIS:  Hymenoptera allergy [Z91.030] Anaphylaxis, initial encounter [T78.2XXA] Hypokalemia Hyperlipidemia Hypertension DISCHARGE DIAGNOSIS:  Active Problems:   Anaphylaxis Hypokalemia Hypertension  SECONDARY DIAGNOSIS:   Past Medical History:  Diagnosis Date  . Cancer ([place])    non hodgkin Bcell lymphoma  . Gout   . Hard of hearing   . Hiatal hernia   . Hypertension   . Urinary incontinence      ADMITTING HISTORY John Gallegos  is a 72 y.o. male with a known history of essential hypertension, hyperlipidemia, non-Hodgkin's B-cell lymphoma and, insomnia came to the ED with chest tightness and shortness of breath after he got bit by several 100s of fire ants while he was checking his mail Following that he developed itching rash and suddenly became short of breath with chest tightness.  Patient came into the emergency department and his lower lip was quite swollen at the time.  Patient was given EpiPen x2, Solu-Medrol, Pepcid and Benadryl and poison control team was consulted who has recommended 24 hours of close observation.  During my examination patient's lip swelling was resolved and denies any chest tightness or shortness of breath.  No similar complaints in the past  HOSPITAL COURSE:  Patient admitted to medical floor for observation.  He was put on PRN Benadryl for itching and continued oral steroids.  He did not have any chest tightness, shortness of breath.  His swelling of the lips completely resolved.  He tolerated diet well.  No difficulty breathing.  No difficulty swallowing patient hemodynamically stable will be discharged home.  CONSULTS OBTAINED:   None  DRUG ALLERGIES:  No Known Allergies  DISCHARGE MEDICATIONS:   Allergies as of 01/17/2018   No Known Allergies     Medication List    STOP taking these medications   ezetimibe 10 MG tablet Commonly known as:  ZETIA     TAKE these medications   acetaminophen 500 MG tablet Commonly known as:  TYLENOL Take 500 mg by mouth every 6 (six) hours as needed for mild pain or moderate pain.   amLODipine 5 MG tablet Commonly known as:  NORVASC Take 5 mg by mouth daily.   aspirin EC 81 MG tablet Take 81 mg by mouth daily.   diphenhydrAMINE 50 MG tablet Commonly known as:  BENADRYL Take 0.5 tablets (25 mg total) by mouth every 8 (eight) hours as needed for itching or allergies.   hydrALAZINE 25 MG tablet Commonly known as:  APRESOLINE Take 25 mg by mouth every evening.   losartan 100 MG tablet Commonly known as:  COZAAR Take 100 mg by mouth every evening.   pravastatin 80 MG tablet Commonly known as:  PRAVACHOL Take 1 tablet (80 mg total) by mouth every evening.   predniSONE 10 MG tablet Commonly known as:  DELTASONE Take 1 tablet (10 mg total) by mouth daily. Label  & dispense according to the schedule below.  6 tablets day one, then 5 table day 2, then 4 tablets day 3, then 3 tablets day 4, 2 tablets day 5, then 1 tablet day 6, then stop   zolpidem 10 MG tablet Commonly known as:  AMBIEN Take 10 mg by mouth at  bedtime as needed for sleep.       Today  Seen and evaluated today No shortness of breath No wheezing No chest pain Tolerated diet well Will be discharged home on steroid tapering dose  VITAL SIGNS:  Blood pressure (!) 144/79, pulse (!) 55, temperature 97.7 F (36.5 C), temperature source Oral, resp. rate 20, height 6\' 3"  (1.905 m), weight 100.2 kg (221 lb), SpO2 94 %.  I/O:    Intake/Output Summary (Last 24 hours) at 01/17/2018 1414 Last data filed at 01/17/2018 1017 Gross per 24 hour  Intake 1240 ml  Output -  Net 1240 ml    PHYSICAL  EXAMINATION:  Physical Exam  GENERAL:  72 y.o.-year-old patient lying in the bed with no acute distress.  LUNGS: Normal breath sounds bilaterally, no wheezing, rales,rhonchi or crepitation. No use of accessory muscles of respiration.  CARDIOVASCULAR: S1, S2 normal. No murmurs, rubs, or gallops.  ABDOMEN: Soft, non-tender, non-distended. Bowel sounds present. No organomegaly or mass.  NEUROLOGIC: Moves all 4 extremities. PSYCHIATRIC: The patient is alert and oriented x 3.  SKIN: No obvious rash, lesion, or ulcer.   DATA REVIEW:   CBC Recent Labs  Lab 01/16/18 1458  WBC 6.9  HGB 14.1  HCT 41.0  PLT 236    Chemistries  Recent Labs  Lab 01/16/18 1458 01/17/18 0743  NA 140 140  K 3.0*  2.9* 4.0  CL 106 108  CO2 22 25  GLUCOSE 159* 138*  BUN 18 22*  CREATININE 1.02 0.92  CALCIUM 9.4 9.0  MG 1.8 2.0  AST 23  --   ALT 13*  --   ALKPHOS 97  --   BILITOT 1.1  --     Cardiac Enzymes No results for input(s): TROPONINI in the last 168 hours.  Microbiology Results  Results for orders placed or performed during the hospital encounter of 08/12/17  Urine culture     Status: None   Collection Time: 08/12/17  9:41 AM  Result Value Ref Range Status   Specimen Description   Final    URINE, CLEAN CATCH Performed at [facility], [address] [place], [place] [address]    Special Requests   Final    NONE Performed at [facility], [address] [place], [place] [address]    Culture   Final    NO GROWTH Performed at [facility], [place] [address], [place], [place] [address]    Report Status 08/13/2017 FINAL  Final    RADIOLOGY:  No results found.  Follow up with PCP in 1 week.  Management plans discussed with the patient, family and they are in agreement.  CODE STATUS: Full code    Code Status Orders  (From admission, onward)        Start     Ordered   01/16/18 1802  Full code  Continuous     01/16/18 1801    Code Status  History    This patient has a current code status but no historical code status.    Advance Directive Documentation     Most Recent Value  Type of Advance Directive  Healthcare Power of Attorney  Pre-existing out of facility DNR order (yellow form or pink MOST form)  -  "MOST" Form in Place?  -      TOTAL TIME TAKING CARE OF THIS PATIENT ON DAY OF DISCHARGE: more than 35 minutes.   Saundra Shelling M.D on 01/17/2018 at 2:14 PM  Between  7am to 6pm - Pager - [phone]  After 6pm go to [email or web address] - password [email or web address] [facility]  Office  [phone]  CC: Primary care physician; Tracie Harrier, MD  Note: This dictation was prepared with Dragon dictation along with smaller phrase technology. Any transcriptional errors that result from this process are unintentional.

## 2018-01-17 NOTE — Progress Notes (Signed)
Pharmacy consulted for electrolyte replacement protocol:   Goal of therapy: Electrolytes within normal limits:  K 3.5 - 5.1 Corrected Ca 8.9 - 10.3 Phos 2.5 - 4.6 Mg 1.7 - 2.4   Assessment: Lab Results  Component Value Date   CREATININE 0.92 01/17/2018   BUN 22 (H) 01/17/2018   NA 140 01/17/2018   K 4.0 01/17/2018   CL 108 01/17/2018   CO2 25 01/17/2018    Plan: Labs are WNL, no supplementation needed at this time. Will recheck with AM labs per protocol   Thomasenia Sales, PharmD, MBA, [facility]

## 2018-01-20 DIAGNOSIS — T782XXD Anaphylactic shock, unspecified, subsequent encounter: Secondary | ICD-10-CM | POA: Diagnosis not present

## 2018-01-20 DIAGNOSIS — C8339 Diffuse large B-cell lymphoma, extranodal and solid organ sites: Secondary | ICD-10-CM | POA: Diagnosis not present

## 2018-01-20 DIAGNOSIS — Z09 Encounter for follow-up examination after completed treatment for conditions other than malignant neoplasm: Secondary | ICD-10-CM | POA: Diagnosis not present

## 2018-01-20 DIAGNOSIS — I10 Essential (primary) hypertension: Secondary | ICD-10-CM | POA: Diagnosis not present

## 2018-01-20 DIAGNOSIS — E7800 Pure hypercholesterolemia, unspecified: Secondary | ICD-10-CM | POA: Diagnosis not present

## 2018-02-01 ENCOUNTER — Inpatient Hospital Stay: Payer: Medicare HMO

## 2018-02-08 ENCOUNTER — Encounter: Payer: Self-pay | Admitting: Oncology

## 2018-02-08 ENCOUNTER — Inpatient Hospital Stay: Payer: Medicare HMO | Attending: Oncology

## 2018-02-08 ENCOUNTER — Inpatient Hospital Stay (HOSPITAL_BASED_OUTPATIENT_CLINIC_OR_DEPARTMENT_OTHER): Payer: Medicare HMO | Admitting: Oncology

## 2018-02-08 ENCOUNTER — Other Ambulatory Visit: Payer: Self-pay

## 2018-02-08 VITALS — BP 159/90 | HR 57 | Temp 96.9°F | Resp 18 | Wt 219.2 lb

## 2018-02-08 DIAGNOSIS — C8339 Diffuse large B-cell lymphoma, extranodal and solid organ sites: Secondary | ICD-10-CM | POA: Insufficient documentation

## 2018-02-08 DIAGNOSIS — T451X5A Adverse effect of antineoplastic and immunosuppressive drugs, initial encounter: Secondary | ICD-10-CM

## 2018-02-08 DIAGNOSIS — T782XXS Anaphylactic shock, unspecified, sequela: Secondary | ICD-10-CM

## 2018-02-08 DIAGNOSIS — D6481 Anemia due to antineoplastic chemotherapy: Secondary | ICD-10-CM

## 2018-02-08 LAB — CBC WITH DIFFERENTIAL/PLATELET
BASOS PCT: 4 %
Basophils Absolute: 0.1 10*3/uL (ref 0–0.1)
EOS ABS: 0.2 10*3/uL (ref 0–0.7)
EOS PCT: 5 %
HCT: 36.5 % — ABNORMAL LOW (ref 40.0–52.0)
Hemoglobin: 12.7 g/dL — ABNORMAL LOW (ref 13.0–18.0)
LYMPHS ABS: 0.4 10*3/uL — AB (ref 1.0–3.6)
Lymphocytes Relative: 10 %
MCH: 29.6 pg (ref 26.0–34.0)
MCHC: 34.7 g/dL (ref 32.0–36.0)
MCV: 85.3 fL (ref 80.0–100.0)
MONOS PCT: 10 %
Monocytes Absolute: 0.4 10*3/uL (ref 0.2–1.0)
NEUTROS PCT: 71 %
Neutro Abs: 2.6 10*3/uL (ref 1.4–6.5)
PLATELETS: 158 10*3/uL (ref 150–440)
RBC: 4.28 MIL/uL — ABNORMAL LOW (ref 4.40–5.90)
RDW: 14.6 % — AB (ref 11.5–14.5)
WBC: 3.7 10*3/uL — ABNORMAL LOW (ref 3.8–10.6)

## 2018-02-08 LAB — COMPREHENSIVE METABOLIC PANEL
ALBUMIN: 3.6 g/dL (ref 3.5–5.0)
ALK PHOS: 91 U/L (ref 38–126)
ALT: 14 U/L (ref 0–44)
AST: 16 U/L (ref 15–41)
Anion gap: 6 (ref 5–15)
BUN: 20 mg/dL (ref 8–23)
CALCIUM: 9.5 mg/dL (ref 8.9–10.3)
CO2: 25 mmol/L (ref 22–32)
CREATININE: 0.89 mg/dL (ref 0.61–1.24)
Chloride: 108 mmol/L (ref 98–111)
Glucose, Bld: 93 mg/dL (ref 70–99)
Potassium: 3.6 mmol/L (ref 3.5–5.1)
Sodium: 139 mmol/L (ref 135–145)
Total Bilirubin: 0.9 mg/dL (ref 0.3–1.2)
Total Protein: 7.2 g/dL (ref 6.5–8.1)

## 2018-02-08 LAB — LACTATE DEHYDROGENASE: LDH: 116 U/L (ref 98–192)

## 2018-02-08 NOTE — Progress Notes (Signed)
Patient here for follow up. No concerns voiced.  °

## 2018-02-08 NOTE — Progress Notes (Signed)
Hematology/Oncology follow-up note [facility] Telephone:([phone]) [phone] Fax:([phone]) [phone]   Patient Care Team: Tracie Harrier, MD as PCP - General (Internal Medicine) Earlie Server, MD as Consulting Physician (Oncology) Lucky Cowboy Erskine Squibb, MD as Referring Physician (Vascular Surgery) Abbie Sons, MD (Urology)  REFERRING PROVIDER: Tracie Harrier, MD  REASON FOR VISIT Follow up for surveillance for diffuse large B-cell lymphoma.  HISTORY OF PRESENTING ILLNESS:  John Gallegos is a  72 y.o.  male with PMH listed below presented for follow-up for treatment of diffuse large B cell lymphoma.  Pertinent history of oncology workup includes: Patient was found to have microscopic hematuria on 02/23/2017 with 4-10 RBC's/hpf.    Urine culture was negative.having symptoms of nocturia He is smoker. 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 He was in the [employer] and reports to be exposed to [place] in [place]. CT scan 04/20/2017 which revealed moderate left hydronephrosis and delayed excretion down to a large 4.4 cm mass in or around the left proximal ureter. There was also hazy soft tissue in the fat surrounding the celiac axis and superior mesenteric artery. Thickening of the right diaphragmatic crus. High gastrohepatic ligament lymphadenopathy measures up to 11 mm. Peripancreatic and root of small bowel mesentery adenopathy measures up to 2.6 cm.  He was seen by urologist Dr. Pilar Jarvis and he underwent cystoscopy, left retrograde pyelogram, left ureteroscopy and placement of left ureteral stent placement on 05/13/2017. Finding during the procedure that he has extrinsic compression of the ureter, possibly from lymphoma.  # Pathology:  Biopsy of paraspinal musculature at L2. Pathology results reviewed large B cell lymphoma. It is CD20 positive, CD10 positive. Flow cytometry shows a small clonal population of large B cells positive for CD10 and absent cop and lambda light  chains. The abnormal cells represent 2% of the total cells.The morphology, initial IHC panel, and flow cytometry supports classification as B-cell lymphoma, CD10 positive, with large cell features.diffuse large B-cell lymphoma, germinal center B-cell type. Discussed with pathologist Dr.Onley, Ki67 50%, MUM1 negative, MYC negative, Cycline D1 negative, CD30 negative, FISH is positive for BCL2 gene rearrangement. Negative for MYC and BCL6. 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   # Bone marrow Biopsy: negative for lymphoma involvement.  # PET scan 05/18/2017  IMPRESSION: 1. Multifocal metastatic disease within the LEFT retroperitoneum, central mesenteries, and RIGHT paraspinal musculature. 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 Recommend percutaneous biopsy of the RIGHT paraspinal musculature at L2. 2. 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 # Interim PET scan 07/28/2017 after 2 cycles of RCHOP 1. Marked improvement, with row solution of the prior bony lesions and significant reduction in activity of the central mesenteric and right gastric adenopathy. Central mesenteric nodal is currently Deauville 3, previously Deauville 4 and Deauville 5. 2. Improvement in the right paraspinal muscular activity, currently Deauville 3, previously Deauville 4. I also discussed with radiologist that there has been further improvement in the left periureteral density, likely reflecting treatment response.   # Prognostic Model for the risk of CNS lymphoma involvement per NCCN guideline,  Patient is intermediate risk with score of 2, one from being >60, and one from more than extranodal involvement>1 sites (bone, paraspinal muscle involvement). CNS prophylaxis is usually recommended if score 4-6.  CNS prophylaxis was not offered.    # Treatment:  06/21/2017 Cycle 1 RCHOP [name]. Dexamethasone 42m on Day 1,  Prednisone 80 mg daily Day  2-5. 07/12/2017 Cycle 2 RCHOP [name] .Dexamethasone 82m on Day 1, Prednisone 80 mg daily Day 2-5 08/03/2017 Cycle 3 RCHOP/onpro.Dexamethasone 239mon Day 1, Prednisone 80 mg daily Day 2-5  08/24/2017 Cycle 4 RCHOP/onpro.Dexamethasone 207mn Day 1, Prednisone 80 mg daily Day 2-5  09/14/2017 Cycle 5 RCHOP/onpro.Dexamethasone 16m42m Day 1, Prednisone 80 mg daily Day 2-5  10/05/2017 Cycle 6 RCHOP/onpro.Dexamethasone 16mg48mDay 1, Prednisone 80 mg daily Day 2-5  INTERVAL HISTORY [name] for follow-up after his recent emergency room visit. Patient reports that he got bitten by multiple red ants and developed severe allergic reactions including swollen lips.  He was able to throw himself to the ER and collapsed.  He got epinephrine injection x2, Solu-Medrol, Pepcid and Benadryl.  He was observed in symptoms gets better. For his diffuse large B-cell lymphoma, he continued to do well.  Denies any weight loss, fatigue, fever or chills, hematuria. Continue to have chronic intermittent balancing issue no change.  Review of Systems  Review of Systems  Constitutional: Negative for chills, fever, malaise/fatigue and weight loss.  HENT: Negative for congestion, ear discharge, ear pain, hearing loss, nosebleeds, sinus pain, sore throat and tinnitus.   Eyes: Negative for blurred vision, double vision, photophobia, pain, discharge and redness.  Respiratory: Negative for cough, hemoptysis, sputum production, shortness of breath and wheezing.   Cardiovascular: Negative for chest pain, palpitations, orthopnea, claudication, leg swelling and PND.  Gastrointestinal: Negative for abdominal pain, blood in stool, constipation, diarrhea, heartburn, melena, nausea and vomiting.  Genitourinary: Negative for dysuria, flank pain, frequency, hematuria and urgency.  Musculoskeletal: Negative for back pain, joint pain, myalgias and neck pain.       Chronic back pain Intermittent leg swelling.   Skin: Negative  for itching and rash.  Neurological: Positive for tingling. Negative for dizziness, tremors, sensory change, focal weakness, seizures, weakness and headaches.  Endo/Heme/Allergies: Negative for environmental allergies. Does not bruise/bleed easily.  Psychiatric/Behavioral: Negative for depression, hallucinations, substance abuse and suicidal ideas. The patient is not nervous/anxious and does not have insomnia.    MEDICAL HISTORY:  Past Medical History:  Diagnosis Date  . Cancer (HCC) [place] Bcell lymphoma  . Gout   . Hard of hearing   . Hiatal hernia   . Hypertension   . Urinary incontinence     SURGICAL HISTORY: Past Surgical History:  Procedure Laterality Date  . CYSTOSCOPY W/ RETROGRADES Left 08/19/2017   Procedure: CYSTOSCOPY WITH RETROGRADE PYELOGRAM;  Surgeon: StoioAbbie Sons  Location: [facility];  Service: Urology;  Laterality: Left;  . CYSTOSCOPY W/ URETERAL STENT REMOVAL Left 08/19/2017   Procedure: CYSTOSCOPY WITH STENT REMOVAL;  Surgeon: StoioAbbie Sons  Location: [facility];  Service: Urology;  Laterality: Left;  . CYSTOSCOPY WITH BIOPSY Left 05/13/2017   Procedure: CYSTOSCOPY WITH URETERAL BIOPSY;  Surgeon: BudzyNickie Retort  Location: [facility];  Service: Urology;  Laterality: Left;  . CYSTOSCOPY WITH STENT PLACEMENT Left 05/13/2017   Procedure: CYSTOSCOPY WITH STENT PLACEMENT;  Surgeon: BudzyNickie Retort  Location: [facility];  Service: Urology;  Laterality: Left;  . PORTA CATH INSERTION N/A 06/14/2017   Procedure: PORTA CATH INSERTION;  Surgeon: Dew, Algernon Huxley  [facility];  Service: Cardiovascular;  Laterality: N/A;  . 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 right knne surgery     needle went through knee  . TONSILLECTOMY    . URETEROSCOPY Left 05/13/2017  Procedure: URETEROSCOPY;  Surgeon: Nickie Retort, MD;  Location: [facility];  Service: Urology;  Laterality: Left;    SOCIAL HISTORY: Social History    Socioeconomic History  . Marital status: Married    Spouse name: Not on file  . Number of children: Not on file  . Years of education: Not on file  . Highest education level: Not on file  Occupational History  . Not on file  Social Needs  . Financial resource strain: Not on file  . Food insecurity:    Worry: Not on file    Inability: Not on file  . Transportation needs:    Medical: Not on file    Non-medical: Not on file  Tobacco Use  . Smoking status: Former Smoker    Packs/day: 0.50    Years: 50.00    Pack years: 25.00    Types: Cigarettes  . Smokeless tobacco: Never Used  Substance and Sexual Activity  . Alcohol use: Yes    Alcohol/week: 1.8 oz    Types: 3 Shots of liquor per week    Comment: occ. beer  . Drug use: No  . Sexual activity: Not on file  Lifestyle  . Physical activity:    Days per week: Not on file    Minutes per session: Not on file  . Stress: Not on file  Relationships  . Social connections:    Talks on phone: Not on file    Gets together: Not on file    Attends religious service: Not on file    Active member of club or organization: Not on file    Attends meetings of clubs or organizations: Not on file    Relationship status: Not on file  . Intimate partner violence:    Fear of current or ex partner: Not on file    Emotionally abused: Not on file    Physically abused: Not on file    Forced sexual activity: Not on file  Other Topics Concern  . Not on file  Social History Narrative  . Not on file    FAMILY HISTORY: Family History  Problem Relation Age of Onset  . Pancreatic cancer Mother   . Aneurysm Mother   . Heart attack Mother   . Lung cancer Father   . Lymphoma Sister   . Prostate cancer Neg Hx   . Kidney cancer Neg Hx   . Bladder Cancer Neg Hx     ALLERGIES:  has No Known Allergies.  MEDICATIONS:  Current Outpatient Medications  Medication Sig Dispense Refill  . acetaminophen (TYLENOL) 500 MG tablet Take 500 mg by  mouth every 6 (six) hours as needed for mild pain or moderate pain.    Marland Kitchen amLODipine (NORVASC) 5 MG tablet Take 5 mg by mouth daily.    . diphenhydrAMINE (BENADRYL) 50 MG tablet Take 0.5 tablets (25 mg total) by mouth every 8 (eight) hours as needed for itching or allergies. 20 tablet 0  . EPINEPHrine 0.3 mg/0.3 mL IJ SOAJ injection INJECT 0.3 MLS INTO THE MUSCLE ONCE PRN  3  . hydrALAZINE (APRESOLINE) 25 MG tablet Take 25 mg by mouth every evening.     Marland Kitchen losartan (COZAAR) 100 MG tablet Take 100 mg by mouth every evening.    . pravastatin (PRAVACHOL) 80 MG tablet Take 1 tablet (80 mg total) by mouth every evening. 90 tablet 3  . zolpidem (AMBIEN) 10 MG tablet Take 10 mg by mouth at bedtime as needed for sleep.     Marland Kitchen  aspirin EC 81 MG tablet Take 81 mg by mouth daily.    . predniSONE (DELTASONE) 10 MG tablet Take 1 tablet (10 mg total) by mouth daily. Label  & dispense according to the schedule below.  6 tablets day one, then 5 table day 2, then 4 tablets day 3, then 3 tablets day 4, 2 tablets day 5, then 1 tablet day 6, then stop 21 tablet 0   No current facility-administered medications for this visit.       Marland Kitchen  PHYSICAL EXAMINATION: ECOG PERFORMANCE STATUS: 1 - Symptomatic but completely ambulatory Vitals:   02/08/18 1322  BP: (!) 159/90  Pulse: (!) 57  Resp: 18  Temp: (!) 96.9 F (36.1 C)   Filed Weights   02/08/18 1322  Weight: 219 lb 3.2 oz (99.4 kg)    Physical Exam  Constitutional: He is oriented to person, place, and time and well-developed, well-nourished, and in no distress. No distress.  HENT:  Head: Normocephalic and atraumatic.  Nose: Nose normal.  Mouth/Throat: Oropharynx is clear and moist. No oropharyngeal exudate.  Eyes: Pupils are equal, round, and reactive to light. Conjunctivae and EOM are normal. Right eye exhibits no discharge. Left eye exhibits no discharge. No scleral icterus.  Neck: Normal range of motion. Neck supple. No JVD present.  Cardiovascular:  Normal rate, regular rhythm and normal heart sounds. Exam reveals no gallop.  No murmur heard. Pulmonary/Chest: Effort normal and breath sounds normal. No respiratory distress. He has no wheezes. He has no rales. He exhibits no tenderness.  Abdominal: Soft. Bowel sounds are normal. He exhibits no distension and no mass. There is no tenderness. There is no rebound.  Musculoskeletal: Normal range of motion. He exhibits no edema, tenderness or deformity.  Lymphadenopathy:    He has no cervical adenopathy.  Neurological: He is alert and oriented to person, place, and time. No cranial nerve deficit. He exhibits normal muscle tone. Gait normal. Coordination normal.  Skin: Skin is warm and dry. No rash noted. He is not diaphoretic. No erythema.  Scattered chronic skin pigmentation Skin tag  Psychiatric: Affect and judgment normal.    LABORATORY DATA:  I have reviewed the data as listed Lab Results  Component Value Date   WBC 3.7 (L) 02/08/2018   HGB 12.7 (L) 02/08/2018   HCT 36.5 (L) 02/08/2018   MCV 85.3 02/08/2018   PLT 158 02/08/2018   Recent Labs    11/09/17 1437 01/16/18 1458 01/17/18 0743 02/08/18 1253  NA 140 140 140 139  K 3.7 3.0*  2.9* 4.0 3.6  CL 108 106 108 108  CO2 '24 22 25 25  ' GLUCOSE 91 159* 138* 93  BUN 20 18 22* 20  CREATININE 0.83 1.02 0.92 0.89  CALCIUM 9.0 9.4 9.0 9.5  GFRNONAA >60 >60 >60 >60  GFRAA >60 >60 >60 >60  PROT 6.7 7.5  --  7.2  ALBUMIN 3.7 4.1  --  3.6  AST 19 23  --  16  ALT 15* 13*  --  14  ALKPHOS 88 97  --  91  BILITOT 0.6 1.1  --  0.9   Hepatitis B antigen negative. 2-D echo.showed LVEF 60%   ASSESSMENT & PLAN:  This is a 72 y.o. male with stage IV diffuse large B-cell lymphoma, s/p first-line chemotherapy with R CHOP with curative intent, present for follow-up.  1. Diffuse large B-cell lymphoma of extranodal site excluding spleen and other solid organs ([place])   2. Anaphylaxis, sequela    #  DLBCL, in complete remission.   Continue  active surveillance with lab work and history and physical examination every 3 months. # Medi port flushes every 6-8 weeks scheduled.   Patient agrees with plan of keeping medi port in for the first year after her finishes   #Anaphylactic reaction and bites, advised patient to carry EpiPen with him. - The patient knows to call the clinic with any problems questions or concerns.  Return of visit: lab (cbc, cmp, LDH) /MD in 3 months.  Total face to face encounter time for this patient visit was 74mn. >50% of the time was  spent in counseling and coordination of care.  ZEarlie Server MD, PhD Hematology Oncology [facility]ry[facility] Centerat AHocking Valley Community HospitalPager- [phone]/03/19

## 2018-03-15 ENCOUNTER — Inpatient Hospital Stay: Payer: Medicare HMO

## 2018-03-16 DIAGNOSIS — Z87891 Personal history of nicotine dependence: Secondary | ICD-10-CM | POA: Diagnosis not present

## 2018-03-16 DIAGNOSIS — D649 Anemia, unspecified: Secondary | ICD-10-CM | POA: Diagnosis not present

## 2018-03-16 DIAGNOSIS — R739 Hyperglycemia, unspecified: Secondary | ICD-10-CM | POA: Diagnosis not present

## 2018-03-16 DIAGNOSIS — E7800 Pure hypercholesterolemia, unspecified: Secondary | ICD-10-CM | POA: Diagnosis not present

## 2018-03-16 DIAGNOSIS — C8339 Diffuse large B-cell lymphoma, extranodal and solid organ sites: Secondary | ICD-10-CM | POA: Diagnosis not present

## 2018-03-16 DIAGNOSIS — I10 Essential (primary) hypertension: Secondary | ICD-10-CM | POA: Diagnosis not present

## 2018-03-16 DIAGNOSIS — E8809 Other disorders of plasma-protein metabolism, not elsewhere classified: Secondary | ICD-10-CM | POA: Diagnosis not present

## 2018-03-17 DIAGNOSIS — E8809 Other disorders of plasma-protein metabolism, not elsewhere classified: Secondary | ICD-10-CM | POA: Diagnosis not present

## 2018-03-17 DIAGNOSIS — Z87891 Personal history of nicotine dependence: Secondary | ICD-10-CM | POA: Diagnosis not present

## 2018-03-17 DIAGNOSIS — C8339 Diffuse large B-cell lymphoma, extranodal and solid organ sites: Secondary | ICD-10-CM | POA: Diagnosis not present

## 2018-03-17 DIAGNOSIS — D649 Anemia, unspecified: Secondary | ICD-10-CM | POA: Diagnosis not present

## 2018-03-17 DIAGNOSIS — E7800 Pure hypercholesterolemia, unspecified: Secondary | ICD-10-CM | POA: Diagnosis not present

## 2018-03-17 DIAGNOSIS — R739 Hyperglycemia, unspecified: Secondary | ICD-10-CM | POA: Diagnosis not present

## 2018-03-17 DIAGNOSIS — I10 Essential (primary) hypertension: Secondary | ICD-10-CM | POA: Diagnosis not present

## 2018-03-20 ENCOUNTER — Inpatient Hospital Stay: Payer: Medicare HMO | Attending: Oncology

## 2018-03-20 DIAGNOSIS — Z452 Encounter for adjustment and management of vascular access device: Secondary | ICD-10-CM | POA: Insufficient documentation

## 2018-03-20 DIAGNOSIS — C8339 Diffuse large B-cell lymphoma, extranodal and solid organ sites: Secondary | ICD-10-CM

## 2018-03-20 MED ORDER — HEPARIN SOD (PORK) LOCK FLUSH 100 UNIT/ML IV SOLN
500.0000 [IU] | Freq: Once | INTRAVENOUS | Status: AC
  Administered 2018-03-20: 500 [IU] via INTRAVENOUS

## 2018-03-20 MED ORDER — SODIUM CHLORIDE 0.9% FLUSH
10.0000 mL | Freq: Once | INTRAVENOUS | Status: AC
  Administered 2018-03-20: 10 mL via INTRAVENOUS
  Filled 2018-03-20: qty 10

## 2018-03-23 DIAGNOSIS — Z0000 Encounter for general adult medical examination without abnormal findings: Secondary | ICD-10-CM | POA: Diagnosis not present

## 2018-03-23 DIAGNOSIS — M1990 Unspecified osteoarthritis, unspecified site: Secondary | ICD-10-CM | POA: Diagnosis not present

## 2018-03-23 DIAGNOSIS — C8339 Diffuse large B-cell lymphoma, extranodal and solid organ sites: Secondary | ICD-10-CM | POA: Diagnosis not present

## 2018-03-23 DIAGNOSIS — E7800 Pure hypercholesterolemia, unspecified: Secondary | ICD-10-CM | POA: Diagnosis not present

## 2018-03-23 DIAGNOSIS — D649 Anemia, unspecified: Secondary | ICD-10-CM | POA: Diagnosis not present

## 2018-03-23 DIAGNOSIS — I10 Essential (primary) hypertension: Secondary | ICD-10-CM | POA: Diagnosis not present

## 2018-04-17 ENCOUNTER — Ambulatory Visit: Payer: Self-pay | Admitting: Oncology

## 2018-04-17 ENCOUNTER — Other Ambulatory Visit: Payer: Self-pay

## 2018-04-26 ENCOUNTER — Inpatient Hospital Stay: Payer: Medicare HMO | Attending: Oncology

## 2018-04-26 DIAGNOSIS — Z452 Encounter for adjustment and management of vascular access device: Secondary | ICD-10-CM | POA: Insufficient documentation

## 2018-04-26 DIAGNOSIS — Z95828 Presence of other vascular implants and grafts: Secondary | ICD-10-CM

## 2018-04-26 DIAGNOSIS — C8339 Diffuse large B-cell lymphoma, extranodal and solid organ sites: Secondary | ICD-10-CM | POA: Insufficient documentation

## 2018-04-26 MED ORDER — SODIUM CHLORIDE 0.9% FLUSH
10.0000 mL | INTRAVENOUS | Status: DC | PRN
  Administered 2018-04-26: 10 mL via INTRAVENOUS
  Filled 2018-04-26: qty 10

## 2018-04-26 MED ORDER — HEPARIN SOD (PORK) LOCK FLUSH 100 UNIT/ML IV SOLN
500.0000 [IU] | Freq: Once | INTRAVENOUS | Status: AC
  Administered 2018-04-26: 500 [IU] via INTRAVENOUS

## 2018-04-26 MED ORDER — HEPARIN SOD (PORK) LOCK FLUSH 100 UNIT/ML IV SOLN
INTRAVENOUS | Status: AC
  Filled 2018-04-26: qty 5

## 2018-05-10 ENCOUNTER — Inpatient Hospital Stay: Payer: Medicare HMO | Attending: Oncology

## 2018-05-10 ENCOUNTER — Other Ambulatory Visit: Payer: Self-pay

## 2018-05-10 ENCOUNTER — Inpatient Hospital Stay: Payer: Medicare HMO | Admitting: Oncology

## 2018-05-10 ENCOUNTER — Encounter: Payer: Self-pay | Admitting: Oncology

## 2018-05-10 VITALS — BP 167/90 | HR 65 | Temp 96.1°F | Wt 223.4 lb

## 2018-05-10 DIAGNOSIS — Z95828 Presence of other vascular implants and grafts: Secondary | ICD-10-CM

## 2018-05-10 DIAGNOSIS — D6481 Anemia due to antineoplastic chemotherapy: Secondary | ICD-10-CM

## 2018-05-10 DIAGNOSIS — C8339 Diffuse large B-cell lymphoma, extranodal and solid organ sites: Secondary | ICD-10-CM | POA: Diagnosis not present

## 2018-05-10 DIAGNOSIS — T451X5A Adverse effect of antineoplastic and immunosuppressive drugs, initial encounter: Secondary | ICD-10-CM

## 2018-05-10 LAB — CBC WITH DIFFERENTIAL/PLATELET
BASOS PCT: 1 %
Basophils Absolute: 0 10*3/uL (ref 0–0.1)
EOS ABS: 0.1 10*3/uL (ref 0–0.7)
Eosinophils Relative: 3 %
HEMATOCRIT: 38.9 % — AB (ref 40.0–52.0)
Hemoglobin: 13.4 g/dL (ref 13.0–18.0)
Lymphocytes Relative: 9 %
Lymphs Abs: 0.5 10*3/uL — ABNORMAL LOW (ref 1.0–3.6)
MCH: 30.7 pg (ref 26.0–34.0)
MCHC: 34.6 g/dL (ref 32.0–36.0)
MCV: 88.7 fL (ref 80.0–100.0)
MONO ABS: 0.6 10*3/uL (ref 0.2–1.0)
MONOS PCT: 11 %
Neutro Abs: 3.9 10*3/uL (ref 1.4–6.5)
Neutrophils Relative %: 76 %
PLATELETS: 168 10*3/uL (ref 150–440)
RBC: 4.38 MIL/uL — ABNORMAL LOW (ref 4.40–5.90)
RDW: 13.5 % (ref 11.5–14.5)
WBC: 5.1 10*3/uL (ref 3.8–10.6)

## 2018-05-10 LAB — COMPREHENSIVE METABOLIC PANEL
ALBUMIN: 4.1 g/dL (ref 3.5–5.0)
ALT: 18 U/L (ref 0–44)
ANION GAP: 8 (ref 5–15)
AST: 19 U/L (ref 15–41)
Alkaline Phosphatase: 100 U/L (ref 38–126)
BILIRUBIN TOTAL: 0.8 mg/dL (ref 0.3–1.2)
BUN: 16 mg/dL (ref 8–23)
CO2: 28 mmol/L (ref 22–32)
Calcium: 9.6 mg/dL (ref 8.9–10.3)
Chloride: 106 mmol/L (ref 98–111)
Creatinine, Ser: 0.94 mg/dL (ref 0.61–1.24)
GFR calc Af Amer: >60 mL/min (ref >60)
GFR calc non Af Amer: >60 mL/min (ref >60)
GLUCOSE: 96 mg/dL (ref 70–99)
POTASSIUM: 3.8 mmol/L (ref 3.5–5.1)
Sodium: 142 mmol/L (ref 135–145)
TOTAL PROTEIN: 7.3 g/dL (ref 6.5–8.1)

## 2018-05-10 LAB — LACTATE DEHYDROGENASE: LDH: 137 U/L (ref 98–192)

## 2018-05-11 NOTE — Progress Notes (Signed)
Hematology/Oncology follow-up note [facility] Telephone:([phone]) [phone] [phone]) [phone]   Patient Care Team: Tracie Harrier, MD as PCP - General (Internal Medicine) Earlie Server, MD as Consulting Physician (Oncology) Lucky Cowboy Erskine Squibb, MD as Referring Physician (Vascular Surgery) Abbie Sons, MD (Urology)  REFERRING PROVIDER: Tracie Harrier, MD  REASON FOR VISIT Follow up for surveillance for diffuse large B-cell lymphoma.  HISTORY OF PRESENTING ILLNESS:  John Gallegos is a  72 y.o.  male with PMH listed below presented for follow-up for treatment of diffuse large B cell lymphoma.  Pertinent history of oncology workup includes: Patient was found to have microscopic hematuria on 02/23/2017 with 4-10 RBC's/hpf.    Urine culture was negative.having symptoms of nocturia He is smoker. 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 He was in the [employer] and reports to be exposed to [place] in [place]. CT scan 04/20/2017 which revealed moderate left hydronephrosis and delayed excretion down to a large 4.4 cm mass in or around the left proximal ureter. There was also hazy soft tissue in the fat surrounding the celiac axis and superior mesenteric artery. Thickening of the right diaphragmatic crus. High gastrohepatic ligament lymphadenopathy measures up to 11 mm. Peripancreatic and root of small bowel mesentery adenopathy measures up to 2.6 cm.  He was seen by urologist Dr. Pilar Jarvis and he underwent cystoscopy, left retrograde pyelogram, left ureteroscopy and placement of left ureteral stent placement on 05/13/2017. Finding during the procedure that he has extrinsic compression of the ureter, possibly from lymphoma.  # Pathology:  Biopsy of paraspinal musculature at L2. Pathology results reviewed large B cell lymphoma. It is CD20 positive, CD10 positive. Flow cytometry shows a small clonal population of large B cells positive for CD10 and absent cop and lambda light  chains. The abnormal cells represent 2% of the total cells.The morphology, initial IHC panel, and flow cytometry supports classification as B-cell lymphoma, CD10 positive, with large cell features.diffuse large B-cell lymphoma, germinal center B-cell type. Discussed with pathologist Dr.Onley, Ki67 50%, MUM1 negative, MYC negative, Cycline D1 negative, CD30 negative, FISH is positive for BCL2 gene rearrangement. Negative for MYC and BCL6. 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   # Bone marrow Biopsy: negative for lymphoma involvement.  # PET scan 05/18/2017  IMPRESSION: 1. Multifocal metastatic disease within the LEFT retroperitoneum, central mesenteries, and RIGHT paraspinal musculature. 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 Recommend percutaneous biopsy of the RIGHT paraspinal musculature at L2. 2. 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 # Interim PET scan 07/28/2017 after 2 cycles of RCHOP 1. Marked improvement, with row solution of the prior bony lesions and significant reduction in activity of the central mesenteric and right gastric adenopathy. Central mesenteric nodal is currently Deauville 3, previously Deauville 4 and Deauville 5. 2. Improvement in the right paraspinal muscular activity, currently Deauville 3, previously Deauville 4. I also discussed with radiologist that there has been further improvement in the left periureteral density, likely reflecting treatment response.   # Prognostic Model for the risk of CNS lymphoma involvement per NCCN guideline,  Patient is intermediate risk with score of 2, one from being >60, and one from more than extranodal involvement>1 sites (bone, paraspinal muscle involvement). CNS prophylaxis is usually recommended if score 4-6.  CNS prophylaxis was not offered.    # Treatment:  06/21/2017 Cycle 1 RCHOP [name]. Dexamethasone 32m on Day 1,  Prednisone 80 mg daily Day  2-5. 07/12/2017 Cycle 2 RCHOP [name] .Dexamethasone 52m on Day 1, Prednisone 80 mg daily Day 2-5 08/03/2017 Cycle 3 RCHOP/onpro.Dexamethasone 289mon Day 1, Prednisone 80 mg daily Day 2-5  08/24/2017 Cycle 4 RCHOP/onpro.Dexamethasone 2048mn Day 1, Prednisone 80 mg daily Day 2-5  09/14/2017 Cycle 5 RCHOP/onpro.Dexamethasone 10m48m Day 1, Prednisone 80 mg daily Day 2-5  10/05/2017 Cycle 6 RCHOP/onpro.Dexamethasone 10mg66mDay 1, Prednisone 80 mg daily Day 2-5  INTERVAL HISTORY 72 y.75 male with oncology history listed as above reviewed by me today presents for follow-up for diffuse large B-cell lymphoma. Patient reports doing fine.  Denies any fatigue, weight loss, night sweating. No recent hospitalization or ER visits. 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 Denies any shortness of breath chest pain, abdominal pain Review of Systems  Review of Systems  Constitutional: Negative for chills, fever, malaise/fatigue and weight loss.  HENT: Negative for congestion, ear discharge, ear pain, hearing loss, nosebleeds, sinus pain, sore throat and tinnitus.   Eyes: Negative for blurred vision, double vision, photophobia, pain, discharge and redness.  Respiratory: Negative for cough, hemoptysis, sputum production, shortness of breath and wheezing.   Cardiovascular: Negative for chest pain, palpitations, orthopnea, claudication, leg swelling and PND.  Gastrointestinal: Negative for abdominal pain, blood in stool, constipation, diarrhea, heartburn, melena, nausea and vomiting.  Genitourinary: Negative for dysuria, flank pain, frequency, hematuria and urgency.  Musculoskeletal: Negative for back pain, joint pain, myalgias and neck pain.       Chronic back pain Intermittent leg swelling.   Skin: Negative for itching and rash.  Neurological: Positive for tingling. Negative for dizziness, tremors, sensory change, focal weakness, seizures, weakness and headaches.  Endo/Heme/Allergies:  Negative for environmental allergies. Does not bruise/bleed easily.  Psychiatric/Behavioral: Negative for depression, hallucinations, substance abuse and suicidal ideas. The patient is not nervous/anxious and does not have insomnia.    MEDICAL HISTORY:  Past Medical History:  Diagnosis Date  . Cancer (HCC) [place] B[place] lymphoma  . Gout   . Hard of hearing   . Hiatal hernia   . Hypertension   . Urinary incontinence     SURGICAL HISTORY: Past Surgical History:  Procedure Laterality Date  . CYSTOSCOPY W/ RETROGRADES Left 08/19/2017   Procedure: CYSTOSCOPY WITH RETROGRADE PYELOGRAM;  Surgeon: StoioAbbie Sons  Location: [facility];  Service: Urology;  Laterality: Left;  . CYSTOSCOPY W/ URETERAL STENT REMOVAL Left 08/19/2017   Procedure: CYSTOSCOPY WITH STENT REMOVAL;  Surgeon: StoioAbbie Sons  Location: [facility];  Service: Urology;  Laterality: Left;  . CYSTOSCOPY WITH BIOPSY Left 05/13/2017   Procedure: CYSTOSCOPY WITH URETERAL BIOPSY;  Surgeon: BudzyNickie Retort  Location: [facility];  Service: Urology;  Laterality: Left;  . CYSTOSCOPY WITH STENT PLACEMENT Left 05/13/2017   Procedure: CYSTOSCOPY WITH STENT PLACEMENT;  Surgeon: BudzyNickie Retort  Location: [facility];  Service: Urology;  Laterality: Left;  . PORTA CATH INSERTION N/A 06/14/2017   Procedure: PORTA CATH INSERTION;  Surgeon: Dew, Algernon Huxley  [facility];  Service: Cardiovascular;  Laterality: N/A;  . 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 right knne surgery     needle went through knee  . TONSILLECTOMY    . URETEROSCOPY Left 05/13/2017   Procedure: URETEROSCOPY;  Surgeon: BudzyNickie Retort  Location: [facility];  Service: Urology;  Laterality: Left;    SOCIAL HISTORY: Social History   Socioeconomic History  . Marital status: Married  Spouse name: Not on file  . Number of children: Not on file  . Years of education: Not on file  . Highest education level: Not on file   Occupational History  . Not on file  Social Needs  . Financial resource strain: Not on file  . Food insecurity:    Worry: Not on file    Inability: Not on file  . Transportation needs:    Medical: Not on file    Non-medical: Not on file  Tobacco Use  . Smoking status: Former Smoker    Packs/day: 0.50    Years: 50.00    Pack years: 25.00    Types: Cigarettes  . Smokeless tobacco: Never Used  Substance and Sexual Activity  . Alcohol use: Yes    Alcohol/week: 3.0 standard drinks    Types: 3 Shots of liquor per week    Comment: occ. beer  . Drug use: No  . Sexual activity: Not on file  Lifestyle  . Physical activity:    Days per week: Not on file    Minutes per session: Not on file  . Stress: Not on file  Relationships  . Social connections:    Talks on phone: Not on file    Gets together: Not on file    Attends religious service: Not on file    Active member of club or organization: Not on file    Attends meetings of clubs or organizations: Not on file    Relationship status: Not on file  . Intimate partner violence:    Fear of current or ex partner: Not on file    Emotionally abused: Not on file    Physically abused: Not on file    Forced sexual activity: Not on file  Other Topics Concern  . Not on file  Social History Narrative  . Not on file    FAMILY HISTORY: Family History  Problem Relation Age of Onset  . Pancreatic cancer Mother   . Aneurysm Mother   . Heart attack Mother   . Lung cancer Father   . Lymphoma Sister   . Prostate cancer Neg Hx   . Kidney cancer Neg Hx   . Bladder Cancer Neg Hx     ALLERGIES:  has No Known Allergies.  MEDICATIONS:  Current Outpatient Medications  Medication Sig Dispense Refill  . acetaminophen (TYLENOL) 500 MG tablet Take 500 mg by mouth every 6 (six) hours as needed for mild pain or moderate pain.    Marland Kitchen amLODipine (NORVASC) 5 MG tablet Take 5 mg by mouth daily.    Marland Kitchen aspirin EC 81 MG tablet Take 81 mg by mouth  daily.    . diphenhydrAMINE (BENADRYL) 50 MG tablet Take 0.5 tablets (25 mg total) by mouth every 8 (eight) hours as needed for itching or allergies. 20 tablet 0  . EPINEPHrine 0.3 mg/0.3 mL IJ SOAJ injection INJECT 0.3 MLS INTO THE MUSCLE ONCE PRN  3  . hydrALAZINE (APRESOLINE) 25 MG tablet Take 25 mg by mouth every evening.     Marland Kitchen losartan (COZAAR) 100 MG tablet Take 100 mg by mouth every evening.    . pravastatin (PRAVACHOL) 80 MG tablet Take 1 tablet (80 mg total) by mouth every evening. 90 tablet 3  . zolpidem (AMBIEN) 10 MG tablet Take 10 mg by mouth at bedtime as needed for sleep.      No current facility-administered medications for this visit.       Marland Kitchen  PHYSICAL EXAMINATION: ECOG  PERFORMANCE STATUS: 1 - Symptomatic but completely ambulatory Vitals:   05/10/18 1503  BP: (!) 167/90  Pulse: 65  Temp: (!) 96.1 F (35.6 C)   Filed Weights   05/10/18 1503  Weight: 223 lb 6 oz (101.3 kg)    Physical Exam  Constitutional: He is oriented to person, place, and time. No distress.  HENT:  Head: Normocephalic and atraumatic.  Nose: Nose normal.  Mouth/Throat: Oropharynx is clear and moist. No oropharyngeal exudate.  Eyes: Pupils are equal, round, and reactive to light. Conjunctivae and EOM are normal. Right eye exhibits no discharge. Left eye exhibits no discharge. No scleral icterus.  Neck: Normal range of motion. Neck supple. No JVD present.  Cardiovascular: Normal rate, regular rhythm and normal heart sounds. Exam reveals no gallop.  No murmur heard. Pulmonary/Chest: Effort normal and breath sounds normal. No respiratory distress. He has no wheezes. He has no rales. He exhibits no tenderness.  Abdominal: Soft. Bowel sounds are normal. He exhibits no distension and no mass. There is no tenderness. There is no rebound.  Musculoskeletal: Normal range of motion. He exhibits no edema, tenderness or deformity.  Lymphadenopathy:    He has no cervical adenopathy.  Neurological: He is  alert and oriented to person, place, and time. No cranial nerve deficit. He exhibits normal muscle tone. Gait normal. Coordination normal.  Skin: Skin is warm and dry. No rash noted. He is not diaphoretic. No erythema.  Scattered chronic skin pigmentation Skin tag  Psychiatric: Affect and judgment normal.    LABORATORY DATA:  I have reviewed the data as listed Lab Results  Component Value Date   WBC 5.1 05/10/2018   HGB 13.4 05/10/2018   HCT 38.9 (L) 05/10/2018   MCV 88.7 05/10/2018   PLT 168 05/10/2018   Recent Labs    01/16/18 1458 01/17/18 0743 02/08/18 1253 05/10/18 1414  NA 140 140 139 142  K 3.0*  2.9* 4.0 3.6 3.8  CL 106 108 108 106  CO2 '22 25 25 28  ' GLUCOSE 159* 138* 93 96  BUN 18 22* 20 16  CREATININE 1.02 0.92 0.89 0.94  CALCIUM 9.4 9.0 9.5 9.6  GFRNONAA >60 >60 >60 >60  GFRAA >60 >60 >60 >60  PROT 7.5  --  7.2 7.3  ALBUMIN 4.1  --  3.6 4.1  AST 23  --  16 19  ALT 13*  --  14 18  ALKPHOS 97  --  91 100  BILITOT 1.1  --  0.9 0.8   Hepatitis B antigen negative. 2-D echo.showed LVEF 60%   ASSESSMENT & PLAN:  This is a 72 y.o. male with stage IV diffuse large B-cell lymphoma, s/p first-line chemotherapy with R CHOP with curative intent, present for follow-up.  1. Diffuse large B-cell lymphoma of extranodal site excluding spleen and other solid organs ([place])   2. Port-A-Cath in place    # DLBCL, in complete remission.   Discussed with patient that per NCCN guidelines, no surveillance image were recommended unless clinically indicated. Continue active surveillance with lab work and history and physical examination every 3 months.  # Medi port flushes every 6-8 weeks scheduled.  Would keep Mediport for 1 year after he finishes chemotherapy.  Return of visit: lab (cbc, cmp, LDH) /MD in 3 months.  Orders Placed This Encounter  Procedures  . Comprehensive metabolic panel    Standing Status:   Future    Standing Expiration Date:   05/11/2019  . Lactate  dehydrogenase  Standing Status:   Future    Standing Expiration Date:   05/11/2019   Earlie Server, MD, PhD Hematology Oncology [facility] Pager- [phone] 05/11/18

## 2018-06-07 ENCOUNTER — Inpatient Hospital Stay: Payer: Medicare HMO

## 2018-07-11 DIAGNOSIS — D649 Anemia, unspecified: Secondary | ICD-10-CM | POA: Diagnosis not present

## 2018-07-11 DIAGNOSIS — I10 Essential (primary) hypertension: Secondary | ICD-10-CM | POA: Diagnosis not present

## 2018-07-11 DIAGNOSIS — E538 Deficiency of other specified B group vitamins: Secondary | ICD-10-CM | POA: Diagnosis not present

## 2018-07-11 DIAGNOSIS — M1990 Unspecified osteoarthritis, unspecified site: Secondary | ICD-10-CM | POA: Diagnosis not present

## 2018-07-11 DIAGNOSIS — C8339 Diffuse large B-cell lymphoma, extranodal and solid organ sites: Secondary | ICD-10-CM | POA: Diagnosis not present

## 2018-07-11 DIAGNOSIS — Z0000 Encounter for general adult medical examination without abnormal findings: Secondary | ICD-10-CM | POA: Diagnosis not present

## 2018-07-11 DIAGNOSIS — E7800 Pure hypercholesterolemia, unspecified: Secondary | ICD-10-CM | POA: Diagnosis not present

## 2018-07-18 DIAGNOSIS — E7800 Pure hypercholesterolemia, unspecified: Secondary | ICD-10-CM | POA: Diagnosis not present

## 2018-07-18 DIAGNOSIS — M545 Low back pain: Secondary | ICD-10-CM | POA: Diagnosis not present

## 2018-07-18 DIAGNOSIS — F5104 Psychophysiologic insomnia: Secondary | ICD-10-CM | POA: Diagnosis not present

## 2018-07-18 DIAGNOSIS — Z23 Encounter for immunization: Secondary | ICD-10-CM | POA: Diagnosis not present

## 2018-07-18 DIAGNOSIS — C8339 Diffuse large B-cell lymphoma, extranodal and solid organ sites: Secondary | ICD-10-CM | POA: Diagnosis not present

## 2018-07-18 DIAGNOSIS — I10 Essential (primary) hypertension: Secondary | ICD-10-CM | POA: Diagnosis not present

## 2018-07-18 DIAGNOSIS — E538 Deficiency of other specified B group vitamins: Secondary | ICD-10-CM | POA: Diagnosis not present

## 2018-07-19 ENCOUNTER — Other Ambulatory Visit: Payer: Self-pay | Admitting: Internal Medicine

## 2018-07-19 ENCOUNTER — Inpatient Hospital Stay: Payer: Medicare HMO | Attending: Oncology

## 2018-07-19 DIAGNOSIS — Z452 Encounter for adjustment and management of vascular access device: Secondary | ICD-10-CM | POA: Diagnosis not present

## 2018-07-19 DIAGNOSIS — Z95828 Presence of other vascular implants and grafts: Secondary | ICD-10-CM

## 2018-07-19 DIAGNOSIS — M545 Low back pain, unspecified: Secondary | ICD-10-CM

## 2018-07-19 DIAGNOSIS — C8339 Diffuse large B-cell lymphoma, extranodal and solid organ sites: Secondary | ICD-10-CM | POA: Insufficient documentation

## 2018-07-19 MED ORDER — HEPARIN SOD (PORK) LOCK FLUSH 100 UNIT/ML IV SOLN
INTRAVENOUS | Status: AC
  Filled 2018-07-19: qty 5

## 2018-07-19 MED ORDER — HEPARIN SOD (PORK) LOCK FLUSH 100 UNIT/ML IV SOLN
500.0000 [IU] | Freq: Once | INTRAVENOUS | Status: AC
  Administered 2018-07-19: 500 [IU] via INTRAVENOUS

## 2018-07-19 MED ORDER — SODIUM CHLORIDE 0.9% FLUSH
10.0000 mL | Freq: Once | INTRAVENOUS | Status: AC
  Administered 2018-07-19: 10 mL via INTRAVENOUS
  Filled 2018-07-19: qty 10

## 2018-07-27 ENCOUNTER — Ambulatory Visit
Admission: RE | Admit: 2018-07-27 | Discharge: 2018-07-27 | Disposition: A | Discharge status: Home / Self Care | Payer: Medicare HMO | Source: Ambulatory Visit | Attending: Internal Medicine | Admitting: Internal Medicine

## 2018-07-27 DIAGNOSIS — C8339 Diffuse large B-cell lymphoma, extranodal and solid organ sites: Secondary | ICD-10-CM

## 2018-07-27 DIAGNOSIS — M545 Low back pain, unspecified: Secondary | ICD-10-CM

## 2018-08-04 ENCOUNTER — Ambulatory Visit
Admission: RE | Admit: 2018-08-04 | Discharge: 2018-08-04 | Disposition: A | Discharge status: Home / Self Care | Payer: Medicare HMO | Source: Ambulatory Visit | Attending: Internal Medicine | Admitting: Internal Medicine

## 2018-08-04 DIAGNOSIS — K8020 Calculus of gallbladder without cholecystitis without obstruction: Secondary | ICD-10-CM | POA: Diagnosis not present

## 2018-08-04 DIAGNOSIS — M545 Low back pain: Secondary | ICD-10-CM | POA: Diagnosis not present

## 2018-08-04 DIAGNOSIS — C8339 Diffuse large B-cell lymphoma, extranodal and solid organ sites: Secondary | ICD-10-CM | POA: Diagnosis not present

## 2018-08-04 HISTORY — DX: Non-Hodgkin lymphoma, unspecified, unspecified site: C85.90

## 2018-08-04 MED ORDER — IOPAMIDOL (ISOVUE-300) INJECTION 61%
100.0000 mL | Freq: Once | INTRAVENOUS | Status: AC | PRN
  Administered 2018-08-04: 100 mL via INTRAVENOUS

## 2018-08-22 ENCOUNTER — Other Ambulatory Visit: Payer: Self-pay

## 2018-08-22 DIAGNOSIS — C8339 Diffuse large B-cell lymphoma, extranodal and solid organ sites: Secondary | ICD-10-CM

## 2018-08-22 DIAGNOSIS — C83398 Diffuse large b-cell lymphoma of other extranodal and solid organ sites: Secondary | ICD-10-CM

## 2018-08-23 ENCOUNTER — Other Ambulatory Visit: Payer: Self-pay

## 2018-08-23 ENCOUNTER — Inpatient Hospital Stay: Payer: Medicare HMO | Admitting: Oncology

## 2018-08-23 ENCOUNTER — Inpatient Hospital Stay: Payer: Medicare HMO | Attending: Oncology

## 2018-08-23 ENCOUNTER — Encounter: Payer: Self-pay | Admitting: Oncology

## 2018-08-23 VITALS — BP 164/92 | HR 60 | Temp 96.0°F | Resp 18 | Wt 232.5 lb

## 2018-08-23 DIAGNOSIS — G8929 Other chronic pain: Secondary | ICD-10-CM | POA: Insufficient documentation

## 2018-08-23 DIAGNOSIS — G629 Polyneuropathy, unspecified: Secondary | ICD-10-CM | POA: Insufficient documentation

## 2018-08-23 DIAGNOSIS — Z95828 Presence of other vascular implants and grafts: Secondary | ICD-10-CM

## 2018-08-23 DIAGNOSIS — Z8572 Personal history of non-Hodgkin lymphomas: Secondary | ICD-10-CM | POA: Diagnosis not present

## 2018-08-23 DIAGNOSIS — M545 Low back pain, unspecified: Secondary | ICD-10-CM

## 2018-08-23 DIAGNOSIS — C8339 Diffuse large B-cell lymphoma, extranodal and solid organ sites: Secondary | ICD-10-CM

## 2018-08-23 LAB — CBC WITH DIFFERENTIAL/PLATELET
Abs Immature Granulocytes: 0.01 10*3/uL (ref 0.00–0.07)
Basophils Absolute: 0 10*3/uL (ref 0.0–0.1)
Basophils Relative: 1 %
Eosinophils Absolute: 0.1 10*3/uL (ref 0.0–0.5)
Eosinophils Relative: 2 %
HEMATOCRIT: 36.6 % — AB (ref 39.0–52.0)
Hemoglobin: 12.4 g/dL — ABNORMAL LOW (ref 13.0–17.0)
IMMATURE GRANULOCYTES: 0 %
LYMPHS ABS: 0.5 10*3/uL — AB (ref 0.7–4.0)
Lymphocytes Relative: 10 %
MCH: 30.2 pg (ref 26.0–34.0)
MCHC: 33.9 g/dL (ref 30.0–36.0)
MCV: 89.1 fL (ref 80.0–100.0)
MONOS PCT: 10 %
Monocytes Absolute: 0.5 10*3/uL (ref 0.1–1.0)
Neutro Abs: 4.3 10*3/uL (ref 1.7–7.7)
Neutrophils Relative %: 77 %
Platelets: 176 10*3/uL (ref 150–400)
RBC: 4.11 MIL/uL — ABNORMAL LOW (ref 4.22–5.81)
RDW: 12.5 % (ref 11.5–15.5)
WBC: 5.5 10*3/uL (ref 4.0–10.5)
nRBC: 0 % (ref 0.0–0.2)

## 2018-08-23 LAB — COMPREHENSIVE METABOLIC PANEL
ALBUMIN: 4.1 g/dL (ref 3.5–5.0)
ALT: 13 U/L (ref 0–44)
AST: 16 U/L (ref 15–41)
Alkaline Phosphatase: 103 U/L (ref 38–126)
Anion gap: 6 (ref 5–15)
BUN: 20 mg/dL (ref 8–23)
CO2: 27 mmol/L (ref 22–32)
Calcium: 9.1 mg/dL (ref 8.9–10.3)
Chloride: 109 mmol/L (ref 98–111)
Creatinine, Ser: 0.91 mg/dL (ref 0.61–1.24)
GFR calc Af Amer: >60 mL/min (ref >60)
GFR calc non Af Amer: >60 mL/min (ref >60)
Glucose, Bld: 103 mg/dL — ABNORMAL HIGH (ref 70–99)
Potassium: 3.7 mmol/L (ref 3.5–5.1)
Sodium: 142 mmol/L (ref 135–145)
Total Bilirubin: 0.9 mg/dL (ref 0.3–1.2)
Total Protein: 7 g/dL (ref 6.5–8.1)

## 2018-08-23 LAB — LACTATE DEHYDROGENASE: LDH: 138 U/L (ref 98–192)

## 2018-08-23 MED ORDER — SODIUM CHLORIDE 0.9% FLUSH
10.0000 mL | INTRAVENOUS | Status: DC | PRN
  Administered 2018-08-23: 10 mL via INTRAVENOUS
  Filled 2018-08-23: qty 10

## 2018-08-23 MED ORDER — GABAPENTIN 300 MG PO CAPS: 300.0000 mg | ORAL_CAPSULE | Freq: Every day | ORAL | 1 refills | Status: DC

## 2018-08-23 MED ORDER — HEPARIN SOD (PORK) LOCK FLUSH 100 UNIT/ML IV SOLN
500.0000 [IU] | Freq: Once | INTRAVENOUS | Status: AC
  Administered 2018-08-23: 500 [IU] via INTRAVENOUS

## 2018-08-23 MED ORDER — HEPARIN SOD (PORK) LOCK FLUSH 100 UNIT/ML IV SOLN
INTRAVENOUS | Status: AC
  Filled 2018-08-23: qty 5

## 2018-08-23 NOTE — Progress Notes (Addendum)
Hematology/Oncology follow-up note [facility] [phone] Fax:([phone]   Patient Care Team: Tracie Harrier, MD as PCP - General (Internal Medicine) Earlie Server, MD as Consulting Physician (Oncology) Lucky Cowboy Erskine Squibb, MD as Referring Physician (Vascular Surgery) Abbie Sons, MD (Urology)  REFERRING PROVIDER: Tracie Harrier, MD  REASON FOR VISIT Follow up for surveillance for diffuse large B-cell lymphoma.  HISTORY OF PRESENTING ILLNESS:  John Gallegos is a  73 y.o.  male with PMH listed below presented for follow-up for treatment of diffuse large B cell lymphoma.  Pertinent history of oncology workup includes: Patient was found to have microscopic hematuria on 02/23/2017 with 4-10 RBC's/hpf.    Urine culture was negative.having symptoms of nocturia He is smoker. 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 He was in the [employer] and reports to be exposed to [place] in [place]. CT scan 04/20/2017 which revealed moderate left hydronephrosis and delayed excretion down to a large 4.4 cm mass in or around the left proximal ureter. There was also hazy soft tissue in the fat surrounding the celiac axis and superior mesenteric artery. Thickening of the right diaphragmatic crus. High gastrohepatic ligament lymphadenopathy measures up to 11 mm. Peripancreatic and root of small bowel mesentery adenopathy measures up to 2.6 cm.  He was seen by urologist Dr. Pilar Jarvis and he underwent cystoscopy, left retrograde pyelogram, left ureteroscopy and placement of left ureteral stent placement on 05/13/2017. Finding during the procedure that he has extrinsic compression of the ureter, possibly from lymphoma.  # Pathology:  Biopsy of paraspinal musculature at L2. Pathology results reviewed large B cell lymphoma. It is CD20 positive, CD10 positive. Flow cytometry shows a small clonal population of large B cells positive for CD10 and absent cop and lambda light  chains. The abnormal cells represent 2% of the total cells.The morphology, initial IHC panel, and flow cytometry supports classification as B-cell lymphoma, CD10 positive, with large cell features.diffuse large B-cell lymphoma, germinal center B-cell type. Discussed with pathologist Dr.Onley, Ki67 50%, MUM1 negative, MYC negative, Cycline D1 negative, CD30 negative, FISH is positive for BCL2 gene rearrangement. Negative for MYC and BCL6. 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   # Bone marrow Biopsy: negative for lymphoma involvement.  # PET scan 05/18/2017  IMPRESSION: 1. Multifocal metastatic disease within the LEFT retroperitoneum, central mesenteries, and RIGHT paraspinal musculature. 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 Recommend percutaneous biopsy of the RIGHT paraspinal musculature at L2. 2. 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 # Interim PET scan 07/28/2017 after 2 cycles of RCHOP 1. Marked improvement, with row solution of the prior bony lesions and significant reduction in activity of the central mesenteric and right gastric adenopathy. Central mesenteric nodal is currently Deauville 3, previously Deauville 4 and Deauville 5. 2. Improvement in the right paraspinal muscular activity, currently Deauville 3, previously Deauville 4. I also discussed with radiologist that there has been further improvement in the left periureteral density, likely reflecting treatment response.   # Prognostic Model for the risk of CNS lymphoma involvement per NCCN guideline,  Patient is intermediate risk with score of 2, one from being >60, and one from more than extranodal involvement>1 sites (bone, paraspinal muscle involvement). CNS prophylaxis is usually recommended if score 4-6.  CNS prophylaxis was not offered.    # Treatment:  06/21/2017 Cycle 1 RCHOP [name]. Dexamethasone 66m on Day 1,  Prednisone 80 mg daily Day  2-5. 07/12/2017 Cycle 2 RCHOP [name] .Dexamethasone 90m on Day 1, Prednisone 80 mg daily Day 2-5 08/03/2017 Cycle 3 RCHOP/onpro.Dexamethasone 265mon Day 1, Prednisone 80 mg daily Day 2-5  08/24/2017 Cycle 4 RCHOP/onpro.Dexamethasone 2067mn Day 1, Prednisone 80 mg daily Day 2-5  09/14/2017 Cycle 5 RCHOP/onpro.Dexamethasone 87m63m Day 1, Prednisone 80 mg daily Day 2-5  10/05/2017 Cycle 6 RCHOP/onpro.Dexamethasone 87mg79mDay 1, Prednisone 80 mg daily Day 2-5  INTERVAL HISTORY 72 y.73 male with oncology history listed as above reviewed by me today presents for follow-up for diffuse large B-cell lymphoma. Reports doing well overall.  No fatigue, unintentional weight loss.fever or chills.  Reports tightness of bilateral calf. Also his chronic intermittent neuropathy is worse, toe and bottom of feet numbness and tingling.  Denies any SOB, chest pain, abdominal pain.   # back pain which is chronic has become worse lately, usually worsened when he changes position from lying to sitting up.  He saw pcp during the interval due to right flank pain.  CT abdomen pelvis was obtained. 08/04/2018 CT abdomen pelvis showed no evidence of acute abnormality.  Stable stranding haziness at the root of mesentery and retroperitoneum.  No new or enlarged lymph nodes identified.  Small to moderate umbilical hernia containing fat, moderate right lower lobe atelectasis and a small loculated right pleural effusion again noted.  Cholelithiasis without CT evidence of acute cholecystitis.  Prostate enlargement.  Aortic atherosclerosis.  Review of Systems  Constitutional: Positive for fatigue. Negative for appetite change, chills, fever and unexpected weight change.  HENT:   Negative for hearing loss and voice change.   Eyes: Negative for eye problems and icterus.  Respiratory: Negative for chest tightness, cough and shortness of breath.   Cardiovascular: Negative for chest pain and leg swelling.  Gastrointestinal: Negative  for abdominal distention and abdominal pain.  Endocrine: Negative for hot flashes.  Genitourinary: Negative for difficulty urinating, dysuria and frequency.   Musculoskeletal: Negative for arthralgias.  Skin: Negative for itching and rash.  Neurological: Positive for numbness. Negative for light-headedness.  Hematological: Negative for adenopathy. Does not bruise/bleed easily.  Psychiatric/Behavioral: Negative for confusion.     MEDICAL HISTORY:  Past Medical History:  Diagnosis Date  . Gout   . Hard of hearing   . Hiatal hernia   . Hypertension   . Non Hodgkin's lymphoma (HCC) [place]2018   B-cell, chemo tx's and in remission in 2019  . Urinary incontinence     SURGICAL HISTORY: Past Surgical History:  Procedure Laterality Date  . CYSTOSCOPY W/ RETROGRADES Left 08/19/2017   Procedure: CYSTOSCOPY WITH RETROGRADE PYELOGRAM;  Surgeon: StoioAbbie Sons  Location: [facility];  Service: Urology;  Laterality: Left;  . CYSTOSCOPY W/ URETERAL STENT REMOVAL Left 08/19/2017   Procedure: CYSTOSCOPY WITH STENT REMOVAL;  Surgeon: StoioAbbie Sons  Location: [facility];  Service: Urology;  Laterality: Left;  . CYSTOSCOPY WITH BIOPSY Left 05/13/2017   Procedure: CYSTOSCOPY WITH URETERAL BIOPSY;  Surgeon: BudzyNickie Retort  Location: [facility];  Service: Urology;  Laterality: Left;  . CYSTOSCOPY WITH STENT PLACEMENT Left 05/13/2017   Procedure: CYSTOSCOPY WITH STENT PLACEMENT;  Surgeon: BudzyNickie Retort  Location: [facility];  Service: Urology;  Laterality: Left;  . PORTA CATH INSERTION N/A 06/14/2017   Procedure: PORTA CATH INSERTION;  Surgeon: Dew, Algernon Huxley  [facility];  Service: Cardiovascular;  Laterality: N/A;  . 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 right  knne surgery     needle went through knee  . TONSILLECTOMY    . URETEROSCOPY Left 05/13/2017   Procedure: URETEROSCOPY;  Surgeon: Nickie Retort, MD;  Location: [facility];  Service: Urology;   Laterality: Left;    SOCIAL HISTORY: Social History   Socioeconomic History  . Marital status: Married    Spouse name: Not on file  . Number of children: Not on file  . Years of education: Not on file  . Highest education level: Not on file  Occupational History  . Not on file  Social Needs  . Financial resource strain: Not on file  . Food insecurity:    Worry: Not on file    Inability: Not on file  . Transportation needs:    Medical: Not on file    Non-medical: Not on file  Tobacco Use  . Smoking status: Former Smoker    Packs/day: 0.50    Years: 50.00    Pack years: 25.00    Types: Cigarettes  . Smokeless tobacco: Never Used  Substance and Sexual Activity  . Alcohol use: Yes    Alcohol/week: 3.0 standard drinks    Types: 3 Shots of liquor per week    Comment: occ. beer  . Drug use: No  . Sexual activity: Not on file  Lifestyle  . Physical activity:    Days per week: Not on file    Minutes per session: Not on file  . Stress: Not on file  Relationships  . Social connections:    Talks on phone: Not on file    Gets together: Not on file    Attends religious service: Not on file    Active member of club or organization: Not on file    Attends meetings of clubs or organizations: Not on file    Relationship status: Not on file  . Intimate partner violence:    Fear of current or ex partner: Not on file    Emotionally abused: Not on file    Physically abused: Not on file    Forced sexual activity: Not on file  Other Topics Concern  . Not on file  Social History Narrative  . Not on file    FAMILY HISTORY: Family History  Problem Relation Age of Onset  . Pancreatic cancer Mother   . Aneurysm Mother   . Heart attack Mother   . Lung cancer Father   . Lymphoma Sister   . Prostate cancer Neg Hx   . Kidney cancer Neg Hx   . Bladder Cancer Neg Hx     ALLERGIES:  has No Known Allergies.  MEDICATIONS:  Current Outpatient Medications  Medication Sig Dispense  Refill  . acetaminophen (TYLENOL) 500 MG tablet Take 500 mg by mouth every 6 (six) hours as needed for mild pain or moderate pain.    Marland Kitchen amLODipine (NORVASC) 5 MG tablet Take 5 mg by mouth daily.    Marland Kitchen aspirin EC 81 MG tablet Take 81 mg by mouth daily.    . diphenhydrAMINE (BENADRYL) 50 MG tablet Take 0.5 tablets (25 mg total) by mouth every 8 (eight) hours as needed for itching or allergies. 20 tablet 0  . EPINEPHrine 0.3 mg/0.3 mL IJ SOAJ injection INJECT 0.3 MLS INTO THE MUSCLE ONCE PRN  3  . hydrALAZINE (APRESOLINE) 25 MG tablet Take 25 mg by mouth every evening.     Marland Kitchen losartan (COZAAR) 100 MG tablet Take 100 mg by mouth every evening.    Marland Kitchen  pravastatin (PRAVACHOL) 80 MG tablet Take 1 tablet (80 mg total) by mouth every evening. 90 tablet 3  . zolpidem (AMBIEN) 10 MG tablet Take 10 mg by mouth at bedtime as needed for sleep.     Marland Kitchen gabapentin (NEURONTIN) 300 MG capsule Take 1 capsule (300 mg total) by mouth at bedtime. 30 capsule 1   Current Facility-Administered Medications  Medication Dose Route Frequency Provider Last Rate Last Dose  . sodium chloride flush (NS) 0.9 % injection 10 mL  10 mL Intravenous PRN Earlie Server, MD   10 mL at 08/23/18 1312      .  PHYSICAL EXAMINATION: ECOG PERFORMANCE STATUS: 1 - Symptomatic but completely ambulatory Vitals:   08/23/18 1323  BP: (!) 164/92  Pulse: 60  Resp: 18  Temp: (!) 96 F (35.6 C)   Filed Weights   08/23/18 1323  Weight: 232 lb 8 oz (105.5 kg)    Physical Exam  Constitutional: He is oriented to person, place, and time. No distress.  HENT:  Head: Normocephalic and atraumatic.  Nose: Nose normal.  Mouth/Throat: Oropharynx is clear and moist. No oropharyngeal exudate.  Eyes: Pupils are equal, round, and reactive to light. Conjunctivae and EOM are normal. Right eye exhibits no discharge. Left eye exhibits no discharge. No scleral icterus.  Neck: Normal range of motion. Neck supple. No JVD present.  Cardiovascular: Normal rate,  regular rhythm and normal heart sounds. Exam reveals no gallop.  No murmur heard. Pulmonary/Chest: Effort normal and breath sounds normal. No respiratory distress. He has no wheezes. He has no rales. He exhibits no tenderness.  Abdominal: Soft. Bowel sounds are normal. He exhibits no distension and no mass. There is no abdominal tenderness. There is no rebound.  Musculoskeletal: Normal range of motion.        General: No tenderness, deformity or edema.  Lymphadenopathy:    He has no cervical adenopathy.  Neurological: He is alert and oriented to person, place, and time. No cranial nerve deficit. He exhibits normal muscle tone. Gait normal. Coordination normal.  Skin: Skin is warm and dry. No rash noted. He is not diaphoretic. No erythema.  Scattered chronic skin pigmentation Skin tag  Psychiatric: Affect and judgment normal.    LABORATORY DATA:  I have reviewed the data as listed Lab Results  Component Value Date   WBC 5.5 08/23/2018   HGB 12.4 (L) 08/23/2018   HCT 36.6 (L) 08/23/2018   MCV 89.1 08/23/2018   PLT 176 08/23/2018   Recent Labs    02/08/18 1253 05/10/18 1414 08/23/18 1305  NA 139 142 142  K 3.6 3.8 3.7  CL 108 106 109  CO2 '25 28 27  ' GLUCOSE 93 96 103*  BUN '20 16 20  ' CREATININE 0.89 0.94 0.91  CALCIUM 9.5 9.6 9.1  GFRNONAA >60 >60 >60  GFRAA >60 >60 >60  PROT 7.2 7.3 7.0  ALBUMIN 3.6 4.1 4.1  AST '16 19 16  ' ALT '14 18 13  ' ALKPHOS 91 100 103  BILITOT 0.9 0.8 0.9   Hepatitis B antigen negative. 2-D echo.showed LVEF 60%  RADIOGRAPHIC STUDIES: I have personally reviewed the radiological images as listed and agreed with the findings in the report. 08/04/2018 CT abdomen and pelvis w contrast 1. No evidence of acute abnormality. 2. Stable stranding/haziness at the root of the mesentery and retroperitoneum. No new or enlarged lymph nodes identified. 3. Small to moderate umbilical hernia containing fat, moderate RIGHT LOWER lobe atelectasis and small loculated  RIGHT pleural effusion  again noted. 4. Cholelithiasis without CT evidence of acute cholecystitis. 5. Prostate enlargement 6.  Aortic Atherosclerosis (ICD10-I70.0).  ASSESSMENT & PLAN:  This is a 73 y.o. male with stage IV diffuse large B-cell lymphoma, s/p first-line chemotherapy with R CHOP with curative intent, present for follow-up.  1. Chronic right-sided low back pain without sciatica   2. Diffuse large B-cell lymphoma of extranodal site excluding spleen and other solid organs ([place])   3. Right-sided low back pain without sciatica, unspecified chronicity   4. Neuropathy   5. Port-A-Cath in place    # DLBCL, in complete remission.   Discussed with patient that per NCCN guidelines, no surveillance image were recommended unless clinically indicated. Labs reviewed and discussed with patient. Stable counts and kidney function.  Clinically doing well. No constitutional symptoms. .  Continue active surveillance with lab work and history and physical examination every 3 months. # Worsened chronic back pain,  recommend MRI lumbar spine.  # Neuropathy, recommend trial of Neurontin 372m daily.  # Medi port flushes every 6 weeks scheduled.  Would keep Mediport for at least 1 year after he finishes chemotherapy.  Return of visit: lab (cbc, cmp, LDH, uric acid) /MD in 3 months.  Orders Placed This Encounter  Procedures  . MR Lumbar Spine W Wo Contrast    Standing Status:   Future    Standing Expiration Date:   08/23/2019    Order Specific Question:   If indicated for the ordered procedure, I authorize the administration of contrast media per Radiology protocol    Answer:   Yes    Order Specific Question:   What is the patient's sedation requirement?    Answer:   No Sedation    Order Specific Question:   Does the patient have a pacemaker or implanted devices?    Answer:   No    Order Specific Question:   Use SRS Protocol?    Answer:   No    Order Specific Question:   Radiology Contrast  Protocol - do NOT remove file path    Answer:   [email or web address]    Order Specific Question:   [facility]?    [facility]  . Schedule Portacath Flush Appointment    Schedule Portacath flush appointment   ZEarlie Server MD, PhD Hematology Oncology [facility]at AL[facility] CenterPager- [phone]/15/20

## 2018-08-23 NOTE — Progress Notes (Signed)
Patient here for follow up. Pt complains worsening of tightness to calves and "pins and needles" to feet.

## 2018-08-24 NOTE — Addendum Note (Signed)
Addended by: Earlie Server on: 08/24/2018 10:39 PM   Modules accepted: Orders

## 2018-09-06 ENCOUNTER — Ambulatory Visit
Admission: RE | Admit: 2018-09-06 | Discharge: 2018-09-06 | Disposition: A | Discharge status: Home / Self Care | Payer: Medicare HMO | Source: Ambulatory Visit | Attending: Oncology | Admitting: Oncology

## 2018-09-06 DIAGNOSIS — M5127 Other intervertebral disc displacement, lumbosacral region: Secondary | ICD-10-CM | POA: Diagnosis not present

## 2018-09-06 DIAGNOSIS — M545 Low back pain, unspecified: Secondary | ICD-10-CM

## 2018-09-06 DIAGNOSIS — C8339 Diffuse large B-cell lymphoma, extranodal and solid organ sites: Secondary | ICD-10-CM | POA: Diagnosis not present

## 2018-09-06 MED ORDER — GADOBUTROL 1 MMOL/ML IV SOLN
10.0000 mL | Freq: Once | INTRAVENOUS | Status: AC | PRN
  Administered 2018-09-06: 10 mL via INTRAVENOUS

## 2018-09-14 DIAGNOSIS — G8929 Other chronic pain: Secondary | ICD-10-CM | POA: Diagnosis not present

## 2018-09-14 DIAGNOSIS — C8339 Diffuse large B-cell lymphoma, extranodal and solid organ sites: Secondary | ICD-10-CM | POA: Diagnosis not present

## 2018-09-14 DIAGNOSIS — F5104 Psychophysiologic insomnia: Secondary | ICD-10-CM | POA: Diagnosis not present

## 2018-09-14 DIAGNOSIS — I10 Essential (primary) hypertension: Secondary | ICD-10-CM | POA: Diagnosis not present

## 2018-09-14 DIAGNOSIS — E7800 Pure hypercholesterolemia, unspecified: Secondary | ICD-10-CM | POA: Diagnosis not present

## 2018-09-14 DIAGNOSIS — M5441 Lumbago with sciatica, right side: Secondary | ICD-10-CM | POA: Diagnosis not present

## 2018-09-14 DIAGNOSIS — M5136 Other intervertebral disc degeneration, lumbar region: Secondary | ICD-10-CM | POA: Diagnosis not present

## 2018-09-15 IMAGING — CT CT BIOPSY
1 of 2 series · 15 of 32 positions shown, 19 images · non-contrast
Comparison: none

CLINICAL DATA: Multifocal metastatic disease within the LEFT
retroperitoneum,central mesenteries, and RIGHT paraspinal
musculature. Findings aremost suggestive of lymphoma. Two foci of
skeletal metastasis (RIGHT distal clavicle and LEFTfemur). No known
primary.

[Series 2: i-spiral 5.0 b30f · axial · 0.79mm/px · z∈[+1002,+1100]mm · 15 of 32 slices shown, 19 images]
[im 2/32  soft-tissue]
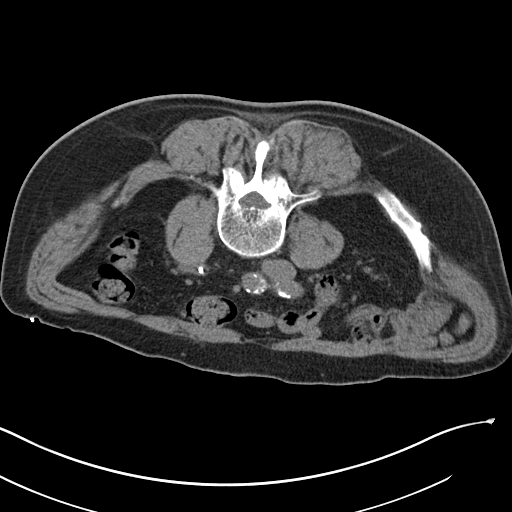
[im 2/32  bone]
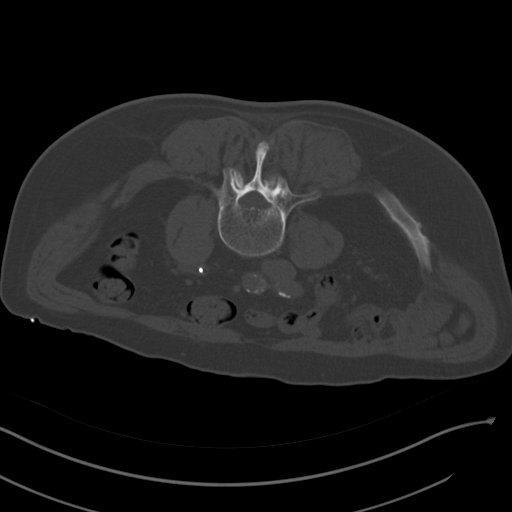
[im 4/32  soft-tissue]
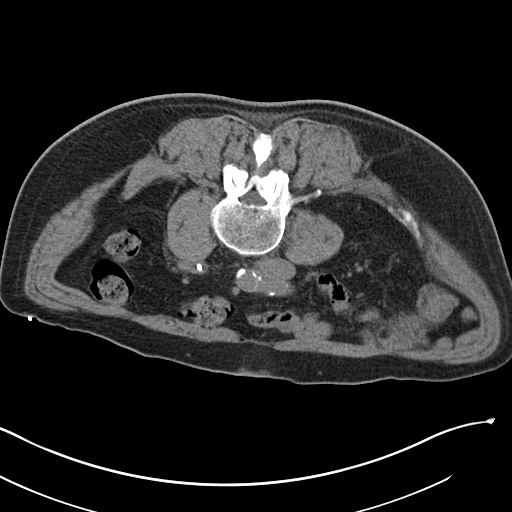
[im 7/32  soft-tissue]
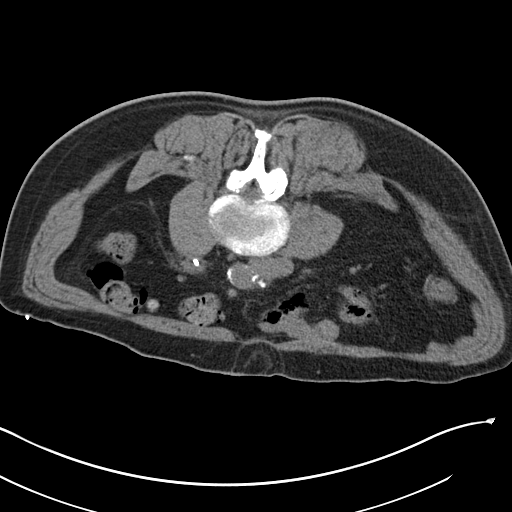
[im 10/32  soft-tissue]
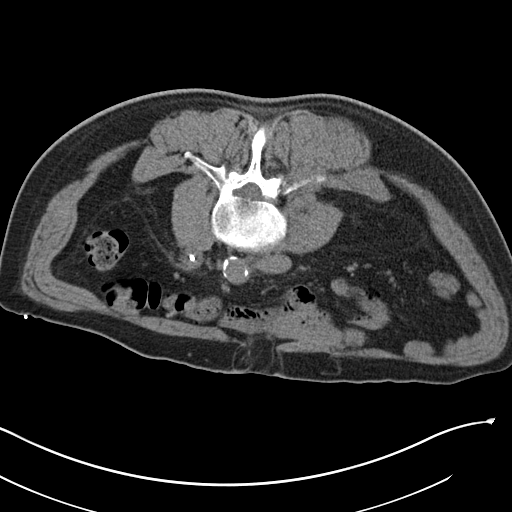
[im 11/32  soft-tissue]
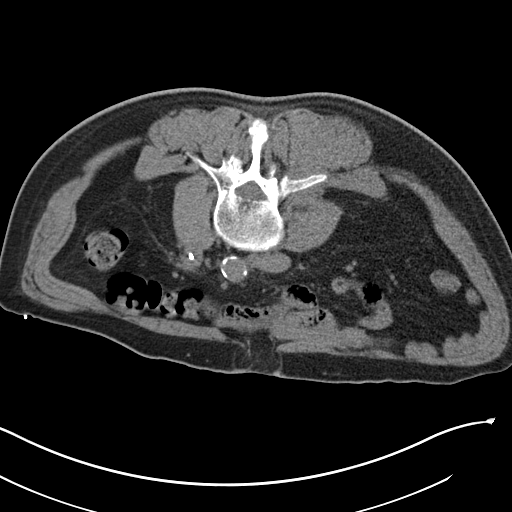
[im 13/32  soft-tissue]
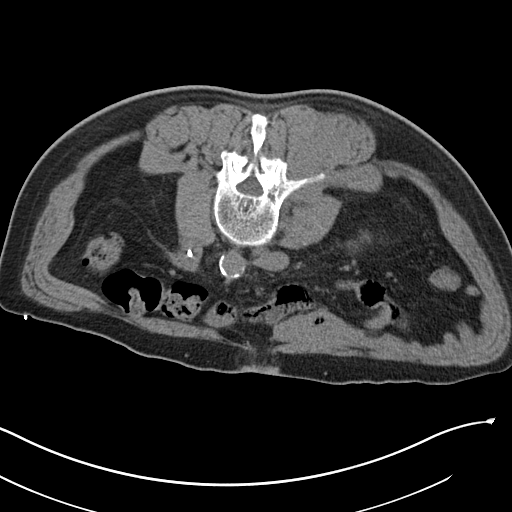
[im 16/32  soft-tissue]
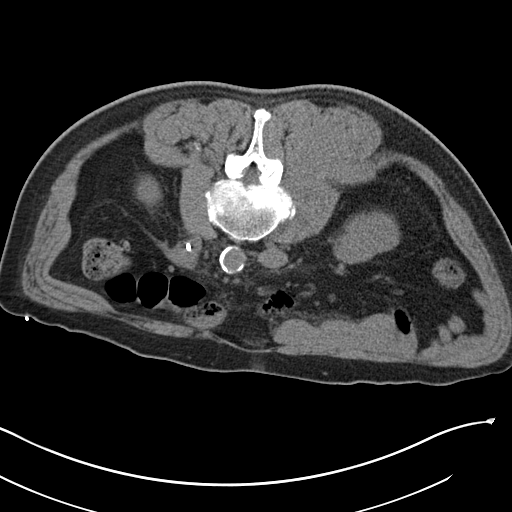
[im 19/32  soft-tissue]
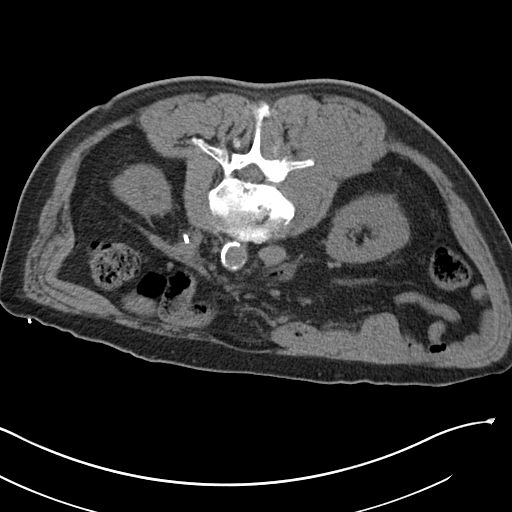
[im 21/32  soft-tissue]
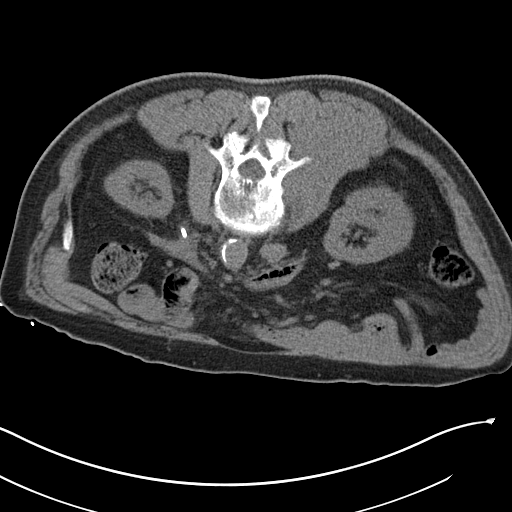
[im 21/32  bone]
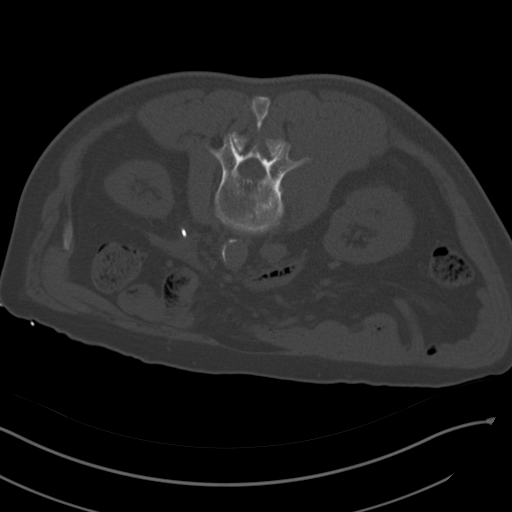
[im 22/32  soft-tissue]
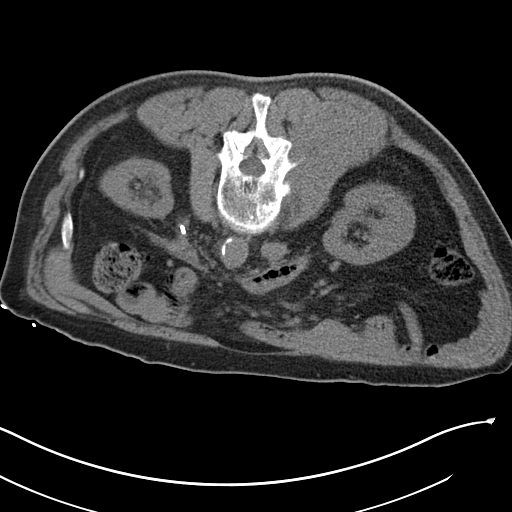
[im 25/32  soft-tissue]
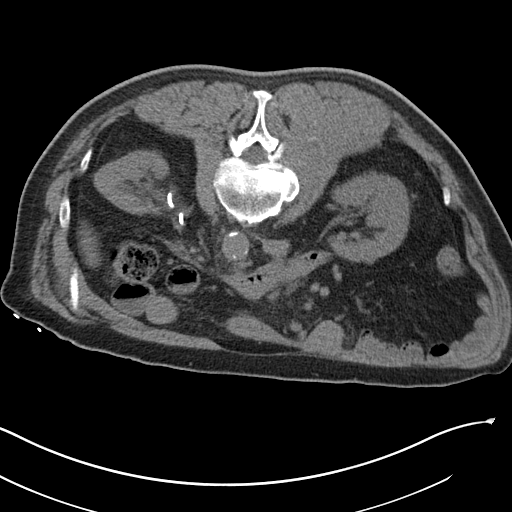
[im 26/32  lung]
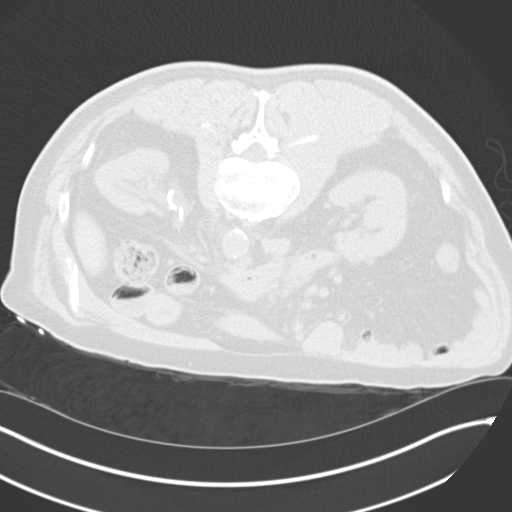
[im 28/32  soft-tissue]
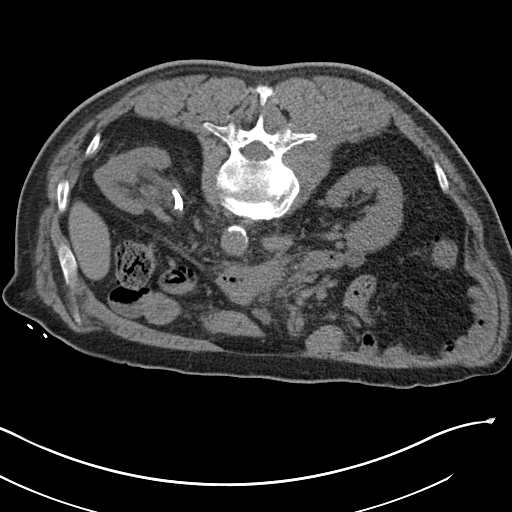
[im 28/32  lung]
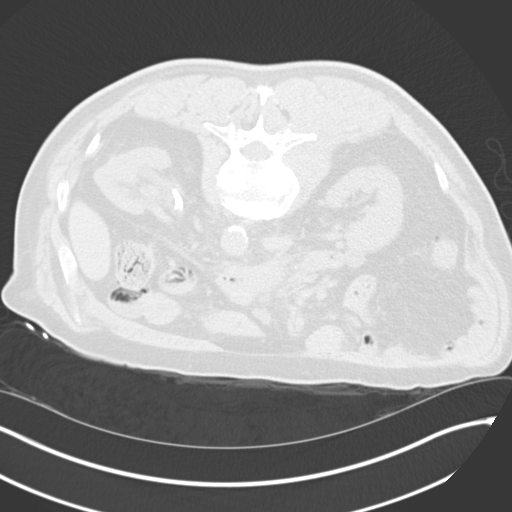
[im 29/32  lung]
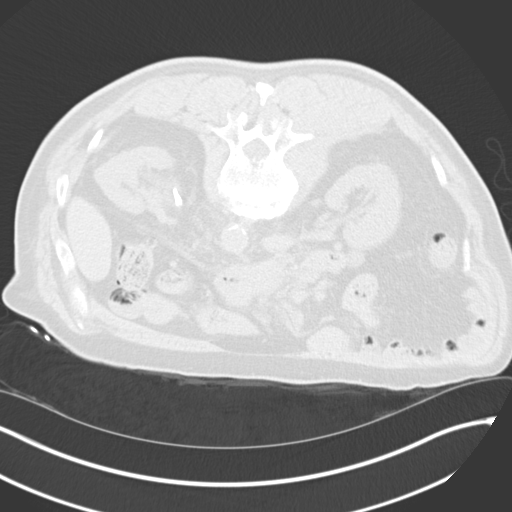
[im 30/32  soft-tissue]
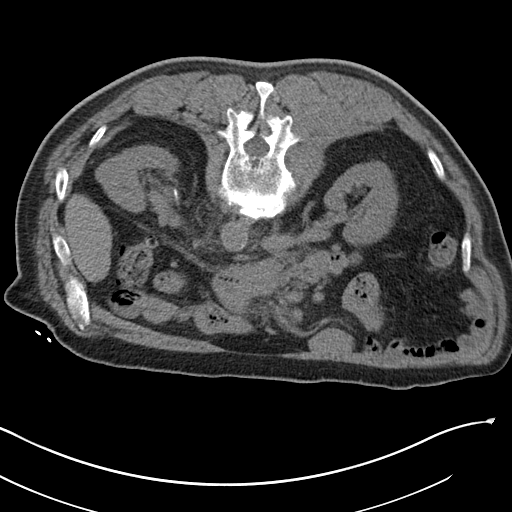
[im 30/32  lung]
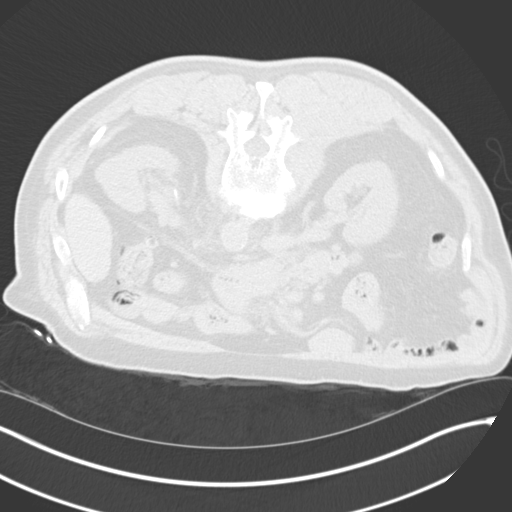

[15 of 32 positions shown; findings below may reference images not displayed]

EXAM:
CT GUIDED CORE BIOPSY OF RIGHT PARASPINAL MASS

ANESTHESIA/SEDATION:
Intravenous Fentanyl and Versed were administered as conscious
sedation during continuous monitoring of the patient's level of
consciousness and physiological / cardiorespiratory status by the
radiology RN, with a total moderate sedation time of 15 minutes.

PROCEDURE:
The procedure risks, benefits, and alternatives were explained to
the patient. Questions regarding the procedure were encouraged and
answered. The patient understands and consents to the procedure.

Patient placed prone. Select axial scans through the retroperitoneum
were obtained in the right paraspinal mass was localized
corresponding to hypermetabolic focus on recent PET-CT.

The operative field was prepped with chlorhexidinein a sterile
fashion, and a sterile drape was applied covering the operative
field. A sterile gown and sterile gloves were used for the
procedure. Local anesthesia was provided with 1% Lidocaine.

Under CT fluoroscopic guidance, a 17 gauge trocar needle was
advanced to the margin of the lesion. Once needle tip position was
confirmed, coaxial 18-gauge core biopsy samples were obtained,
submitted in saline to surgical pathology. The guide needle was
removed. Postprocedure scans show no hemorrhage or other apparent
complication. The patient tolerated the procedure well.

COMPLICATIONS:
None immediate
FINDINGS: Poorly marginated mass within the right paraspinal musculature was
again localized, using recent PET-CT as guide. Representative core
biopsy samples obtained as above without complication.
IMPRESSION: 1. Technically successful CT-guided core biopsy, right paraspinal
mass.

## 2018-10-09 ENCOUNTER — Inpatient Hospital Stay: Payer: Medicare HMO | Attending: Oncology

## 2018-10-09 DIAGNOSIS — Z452 Encounter for adjustment and management of vascular access device: Secondary | ICD-10-CM | POA: Diagnosis not present

## 2018-10-09 DIAGNOSIS — Z8572 Personal history of non-Hodgkin lymphomas: Secondary | ICD-10-CM | POA: Insufficient documentation

## 2018-10-09 DIAGNOSIS — Z95828 Presence of other vascular implants and grafts: Secondary | ICD-10-CM

## 2018-10-09 MED ORDER — SODIUM CHLORIDE 0.9% FLUSH
10.0000 mL | Freq: Once | INTRAVENOUS | Status: AC
  Administered 2018-10-09: 10 mL via INTRAVENOUS
  Filled 2018-10-09: qty 10

## 2018-10-09 MED ORDER — HEPARIN SOD (PORK) LOCK FLUSH 100 UNIT/ML IV SOLN
INTRAVENOUS | Status: AC
  Filled 2018-10-09: qty 5

## 2018-10-09 MED ORDER — HEPARIN SOD (PORK) LOCK FLUSH 100 UNIT/ML IV SOLN
500.0000 [IU] | Freq: Once | INTRAVENOUS | Status: AC
  Administered 2018-10-09: 500 [IU] via INTRAVENOUS

## 2018-10-19 DIAGNOSIS — E7800 Pure hypercholesterolemia, unspecified: Secondary | ICD-10-CM | POA: Diagnosis not present

## 2018-10-19 DIAGNOSIS — C8339 Diffuse large B-cell lymphoma, extranodal and solid organ sites: Secondary | ICD-10-CM | POA: Diagnosis not present

## 2018-10-19 DIAGNOSIS — M545 Low back pain: Secondary | ICD-10-CM | POA: Diagnosis not present

## 2018-10-19 DIAGNOSIS — I10 Essential (primary) hypertension: Secondary | ICD-10-CM | POA: Diagnosis not present

## 2018-10-19 DIAGNOSIS — F5104 Psychophysiologic insomnia: Secondary | ICD-10-CM | POA: Diagnosis not present

## 2018-10-26 DIAGNOSIS — H9190 Unspecified hearing loss, unspecified ear: Secondary | ICD-10-CM | POA: Diagnosis not present

## 2018-10-26 DIAGNOSIS — E7800 Pure hypercholesterolemia, unspecified: Secondary | ICD-10-CM | POA: Diagnosis not present

## 2018-10-26 DIAGNOSIS — Z0000 Encounter for general adult medical examination without abnormal findings: Secondary | ICD-10-CM | POA: Diagnosis not present

## 2018-10-26 DIAGNOSIS — I10 Essential (primary) hypertension: Secondary | ICD-10-CM | POA: Diagnosis not present

## 2018-10-26 DIAGNOSIS — C8339 Diffuse large B-cell lymphoma, extranodal and solid organ sites: Secondary | ICD-10-CM | POA: Diagnosis not present

## 2018-10-26 DIAGNOSIS — M1990 Unspecified osteoarthritis, unspecified site: Secondary | ICD-10-CM | POA: Diagnosis not present

## 2018-10-26 DIAGNOSIS — F5104 Psychophysiologic insomnia: Secondary | ICD-10-CM | POA: Diagnosis not present

## 2018-11-13 ENCOUNTER — Other Ambulatory Visit: Payer: Self-pay | Admitting: *Deleted

## 2018-11-13 MED ORDER — GABAPENTIN 300 MG PO CAPS: 300.0000 mg | ORAL_CAPSULE | Freq: Every day | ORAL | 1 refills | Status: DC

## 2018-11-18 ENCOUNTER — Other Ambulatory Visit: Payer: Self-pay | Admitting: Nurse Practitioner

## 2018-11-18 ENCOUNTER — Encounter: Payer: Self-pay | Admitting: Oncology

## 2018-11-23 ENCOUNTER — Other Ambulatory Visit: Payer: Self-pay

## 2018-11-24 ENCOUNTER — Ambulatory Visit: Payer: Self-pay | Admitting: Oncology

## 2018-11-24 ENCOUNTER — Other Ambulatory Visit: Payer: Self-pay

## 2018-11-24 ENCOUNTER — Inpatient Hospital Stay: Payer: Medicare HMO | Attending: Oncology

## 2018-11-24 DIAGNOSIS — G629 Polyneuropathy, unspecified: Secondary | ICD-10-CM

## 2018-11-24 DIAGNOSIS — Z8572 Personal history of non-Hodgkin lymphomas: Secondary | ICD-10-CM | POA: Diagnosis not present

## 2018-11-24 DIAGNOSIS — M545 Low back pain, unspecified: Secondary | ICD-10-CM

## 2018-11-24 DIAGNOSIS — Z452 Encounter for adjustment and management of vascular access device: Secondary | ICD-10-CM | POA: Insufficient documentation

## 2018-11-24 DIAGNOSIS — C8339 Diffuse large B-cell lymphoma, extranodal and solid organ sites: Secondary | ICD-10-CM

## 2018-11-24 DIAGNOSIS — G8929 Other chronic pain: Secondary | ICD-10-CM

## 2018-11-24 DIAGNOSIS — Z95828 Presence of other vascular implants and grafts: Secondary | ICD-10-CM

## 2018-11-24 LAB — CBC WITH DIFFERENTIAL/PLATELET
Abs Immature Granulocytes: 0.01 10*3/uL (ref 0.00–0.07)
Basophils Absolute: 0 10*3/uL (ref 0.0–0.1)
Basophils Relative: 1 %
Eosinophils Absolute: 0.2 10*3/uL (ref 0.0–0.5)
Eosinophils Relative: 3 %
HCT: 38.5 % — ABNORMAL LOW (ref 39.0–52.0)
Hemoglobin: 12.9 g/dL — ABNORMAL LOW (ref 13.0–17.0)
Immature Granulocytes: 0 %
Lymphocytes Relative: 12 %
Lymphs Abs: 0.7 10*3/uL (ref 0.7–4.0)
MCH: 29.9 pg (ref 26.0–34.0)
MCHC: 33.5 g/dL (ref 30.0–36.0)
MCV: 89.1 fL (ref 80.0–100.0)
Monocytes Absolute: 0.5 10*3/uL (ref 0.1–1.0)
Monocytes Relative: 9 %
Neutro Abs: 4.2 10*3/uL (ref 1.7–7.7)
Neutrophils Relative %: 75 %
Platelets: 174 10*3/uL (ref 150–400)
RBC: 4.32 MIL/uL (ref 4.22–5.81)
RDW: 13.4 % (ref 11.5–15.5)
WBC: 5.6 10*3/uL (ref 4.0–10.5)
nRBC: 0 % (ref 0.0–0.2)

## 2018-11-24 LAB — COMPREHENSIVE METABOLIC PANEL
ALT: 15 U/L (ref 0–44)
AST: 17 U/L (ref 15–41)
Albumin: 4.1 g/dL (ref 3.5–5.0)
Alkaline Phosphatase: 102 U/L (ref 38–126)
Anion gap: 6 (ref 5–15)
BUN: 20 mg/dL (ref 8–23)
CO2: 25 mmol/L (ref 22–32)
Calcium: 8.9 mg/dL (ref 8.9–10.3)
Chloride: 109 mmol/L (ref 98–111)
Creatinine, Ser: 1.15 mg/dL (ref 0.61–1.24)
GFR calc Af Amer: >60 mL/min (ref >60)
GFR calc non Af Amer: >60 mL/min (ref >60)
Glucose, Bld: 104 mg/dL — ABNORMAL HIGH (ref 70–99)
Potassium: 3.5 mmol/L (ref 3.5–5.1)
Sodium: 140 mmol/L (ref 135–145)
Total Bilirubin: 0.6 mg/dL (ref 0.3–1.2)
Total Protein: 7.4 g/dL (ref 6.5–8.1)

## 2018-11-24 LAB — LACTATE DEHYDROGENASE: LDH: 142 U/L (ref 98–192)

## 2018-11-24 LAB — URIC ACID: Uric Acid, Serum: 6.7 mg/dL (ref 3.7–8.6)

## 2018-11-24 MED ORDER — SODIUM CHLORIDE 0.9% FLUSH
10.0000 mL | Freq: Once | INTRAVENOUS | Status: AC
  Administered 2018-11-24: 10 mL via INTRAVENOUS
  Filled 2018-11-24: qty 10

## 2018-11-24 MED ORDER — HEPARIN SOD (PORK) LOCK FLUSH 100 UNIT/ML IV SOLN
500.0000 [IU] | Freq: Once | INTRAVENOUS | Status: AC
  Administered 2018-11-24: 500 [IU] via INTRAVENOUS

## 2018-11-24 NOTE — Patient Instructions (Signed)
Pt did not want to wait on MD since BP is coming down.  Pt reported that he would go home and take his bp med.  Pt instructed to call MD or go to the ED with s/s of cva, headache, blurred vision, chest pain, arm or back pain, voiced understanding.

## 2018-11-27 ENCOUNTER — Inpatient Hospital Stay (HOSPITAL_BASED_OUTPATIENT_CLINIC_OR_DEPARTMENT_OTHER): Payer: Medicare HMO | Admitting: Oncology

## 2018-11-27 ENCOUNTER — Other Ambulatory Visit: Payer: Self-pay

## 2018-11-27 ENCOUNTER — Encounter: Payer: Self-pay | Admitting: Oncology

## 2018-11-27 DIAGNOSIS — Z95828 Presence of other vascular implants and grafts: Secondary | ICD-10-CM

## 2018-11-27 DIAGNOSIS — C8339 Diffuse large B-cell lymphoma, extranodal and solid organ sites: Secondary | ICD-10-CM

## 2018-11-27 DIAGNOSIS — G629 Polyneuropathy, unspecified: Secondary | ICD-10-CM | POA: Diagnosis not present

## 2018-11-27 NOTE — Progress Notes (Signed)
Called patient for Doximity visit.  Patient states no new concerns today

## 2018-11-28 NOTE — Progress Notes (Signed)
HEMATOLOGY-ONCOLOGY TeleHEALTH VISIT PROGRESS NOTE  I connected with John Gallegos on 11/27/18 at 10:15 AM EDT by video enabled telemedicine visit and verified that I am speaking with the correct person using two identifiers. I discussed the limitations, risks, security and privacy concerns of performing an evaluation and management service by telemedicine and the availability of in-person appointments. I also discussed with the patient that there may be a patient responsible charge related to this service. The patient expressed understanding and agreed to proceed.   Other persons participating in the visit and their role in the encounter:  Geraldine Solar, [place], check in patient   Janeann Merl, RN, check in patient.   Patient's location: Home  Provider's location: work   Risk analyst Complaint: 3 months follow-up for history of diffuse large B-cell lymphoma.   INTERVAL HISTORY John Gallegos is a 73 y.o. male who has above history reviewed by me today presents for follow up visit for management of 3 months follow-up for history of diffuse large B-cell lymphoma. Problems and complaints are listed below:  Patient reports feeling well today.  Denies any fever, chills, night sweating, fatigue, unintentional weight loss. Expressing no new concerns today.  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  Review of Systems  Constitutional: Negative for appetite change, chills, fatigue, fever and unexpected weight change.  HENT:   Negative for hearing loss and voice change.   Eyes: Negative for eye problems and icterus.  Respiratory: Negative for chest tightness, cough and shortness of breath.   Cardiovascular: Negative for chest pain and leg swelling.  Gastrointestinal: Negative for abdominal distention and abdominal pain.  Endocrine: Negative for hot flashes.  Genitourinary: Negative for difficulty urinating, dysuria and frequency.    Musculoskeletal: Negative for arthralgias.  Skin: Negative for itching and rash.  Neurological: Positive for numbness. Negative for light-headedness.  Hematological: Negative for adenopathy. Does not bruise/bleed easily.  Psychiatric/Behavioral: Negative for confusion.    Past Medical History:  Diagnosis Date  . Gout   . Hard of hearing   . Hiatal hernia   . Hypertension   . Non Hodgkin's lymphoma ([place]) 04/2017   B-cell, chemo tx's and in remission in 2019  . Urinary incontinence    Past Surgical History:  Procedure Laterality Date  . CYSTOSCOPY W/ RETROGRADES Left 08/19/2017   Procedure: CYSTOSCOPY WITH RETROGRADE PYELOGRAM;  Surgeon: Abbie Sons, MD;  Location: [facility];  Service: Urology;  Laterality: Left;  . CYSTOSCOPY W/ URETERAL STENT REMOVAL Left 08/19/2017   Procedure: CYSTOSCOPY WITH STENT REMOVAL;  Surgeon: Abbie Sons, MD;  Location: [facility];  Service: Urology;  Laterality: Left;  . CYSTOSCOPY WITH BIOPSY Left 05/13/2017   Procedure: CYSTOSCOPY WITH URETERAL BIOPSY;  Surgeon: Nickie Retort, MD;  Location: [facility];  Service: Urology;  Laterality: Left;  . CYSTOSCOPY WITH STENT PLACEMENT Left 05/13/2017   Procedure: CYSTOSCOPY WITH STENT PLACEMENT;  Surgeon: Nickie Retort, MD;  Location: [facility];  Service: Urology;  Laterality: Left;  . PORTA CATH INSERTION N/A 06/14/2017   Procedure: PORTA CATH INSERTION;  Surgeon: Algernon Huxley, MD;  Location: [facility];  Service: Cardiovascular;  Laterality: N/A;  . 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 right knne surgery     needle went through knee  . TONSILLECTOMY    . URETEROSCOPY Left 05/13/2017   Procedure: URETEROSCOPY;  Surgeon: Nickie Retort, MD;  Location: [facility];  Service: Urology;  Laterality:  Left;    Family History  Problem Relation Age of Onset  . Pancreatic cancer Mother   . Aneurysm Mother   . Heart attack Mother   . Lung cancer Father   . Lymphoma Sister   . Prostate cancer  Neg Hx   . Kidney cancer Neg Hx   . Bladder Cancer Neg Hx     Social History   Socioeconomic History  . Marital status: Married    Spouse name: Not on file  . Number of children: Not on file  . Years of education: Not on file  . Highest education level: Not on file  Occupational History  . Not on file  Social Needs  . Financial resource strain: Not on file  . Food insecurity:    Worry: Not on file    Inability: Not on file  . Transportation needs:    Medical: Not on file    Non-medical: Not on file  Tobacco Use  . Smoking status: Former Smoker    Packs/day: 0.50    Years: 50.00    Pack years: 25.00    Types: Cigarettes  . Smokeless tobacco: Never Used  Substance and Sexual Activity  . Alcohol use: Yes    Alcohol/week: 3.0 standard drinks    Types: 3 Shots of liquor per week    Comment: occ. beer  . Drug use: No  . Sexual activity: Not on file  Lifestyle  . Physical activity:    Days per week: Not on file    Minutes per session: Not on file  . Stress: Not on file  Relationships  . Social connections:    Talks on phone: Not on file    Gets together: Not on file    Attends religious service: Not on file    Active member of club or organization: Not on file    Attends meetings of clubs or organizations: Not on file    Relationship status: Not on file  . Intimate partner violence:    Fear of current or ex partner: Not on file    Emotionally abused: Not on file    Physically abused: Not on file    Forced sexual activity: Not on file  Other Topics Concern  . Not on file  Social History Narrative  . Not on file    Current Outpatient Medications on File Prior to Visit  Medication Sig Dispense Refill  . acetaminophen (TYLENOL) 500 MG tablet Take 500 mg by mouth every 6 (six) hours as needed for mild pain or moderate pain.    Marland Kitchen amLODipine (NORVASC) 5 MG tablet Take 5 mg by mouth daily.    Marland Kitchen aspirin EC 81 MG tablet Take 81 mg by mouth daily.    . diphenhydrAMINE  (BENADRYL) 50 MG tablet Take 0.5 tablets (25 mg total) by mouth every 8 (eight) hours as needed for itching or allergies. 20 tablet 0  . EPINEPHrine 0.3 mg/0.3 mL IJ SOAJ injection INJECT 0.3 MLS INTO THE MUSCLE ONCE PRN  3  . ezetimibe (ZETIA) 10 MG tablet Take 10 mg by mouth daily.    Marland Kitchen gabapentin (NEURONTIN) 300 MG capsule Take 1 capsule (300 mg total) by mouth at bedtime. 30 capsule 1  . hydrALAZINE (APRESOLINE) 25 MG tablet Take 25 mg by mouth every evening.     Marland Kitchen losartan (COZAAR) 100 MG tablet Take 100 mg by mouth every evening.    . pravastatin (PRAVACHOL) 80 MG tablet Take 1 tablet (80 mg total) by  mouth every evening. 90 tablet 3  . zolpidem (AMBIEN) 10 MG tablet Take 10 mg by mouth at bedtime as needed for sleep.      No current facility-administered medications on file prior to visit.     No Known Allergies     Observations/Objective: There were no vitals filed for this visit. There is no height or weight on file to calculate BMI.  Physical Exam  CBC    Component Value Date/Time   WBC 5.6 11/24/2018 1046   RBC 4.32 11/24/2018 1046   HGB 12.9 (L) 11/24/2018 1046   HGB 13.8 09/18/2013 1128   HCT 38.5 (L) 11/24/2018 1046   HCT 39.3 (L) 09/18/2013 1128   PLT 174 11/24/2018 1046   PLT 135 (L) 09/18/2013 1128   MCV 89.1 11/24/2018 1046   MCV 89 09/18/2013 1128   MCH 29.9 11/24/2018 1046   MCHC 33.5 11/24/2018 1046   RDW 13.4 11/24/2018 1046   RDW 13.4 09/18/2013 1128   LYMPHSABS 0.7 11/24/2018 1046   MONOABS 0.5 11/24/2018 1046   EOSABS 0.2 11/24/2018 1046   BASOSABS 0.0 11/24/2018 1046    CMP     Component Value Date/Time   NA 140 11/24/2018 1046   NA 133 (L) 09/18/2013 1128   K 3.5 11/24/2018 1046   K 3.1 (L) 09/18/2013 1128   CL 109 11/24/2018 1046   CL 98 09/18/2013 1128   CO2 25 11/24/2018 1046   CO2 29 09/18/2013 1128   GLUCOSE 104 (H) 11/24/2018 1046   GLUCOSE 97 09/18/2013 1128   BUN 20 11/24/2018 1046   BUN 16 04/05/2017 1514   BUN 15 09/18/2013  1128   CREATININE 1.15 11/24/2018 1046   CREATININE 1.25 09/18/2013 1128   CALCIUM 8.9 11/24/2018 1046   CALCIUM 9.1 09/18/2013 1128   PROT 7.4 11/24/2018 1046   ALBUMIN 4.1 11/24/2018 1046   AST 17 11/24/2018 1046   ALT 15 11/24/2018 1046   ALKPHOS 102 11/24/2018 1046   BILITOT 0.6 11/24/2018 1046   GFRNONAA >60 11/24/2018 1046   GFRNONAA 59 (L) 09/18/2013 1128   GFRAA >60 11/24/2018 1046   GFRAA >60 09/18/2013 1128     Assessment and Plan: 1. Diffuse large B-cell lymphoma of extranodal site excluding spleen and other solid organs ([place])   2. Port-A-Cath in place   3. Neuropathy     History of diffuse large B-cell lymphoma, finished 6 cycles of R-CHOP in February 2019. Clinically, patient has been doing satisfactorily.  No B symptoms. Labs are reviewed and discussed with patient. LDH has been stable and has been followed.  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  Advised patient to use gabapentin 300 mg daily.  He voices understanding.  Port-A-Cath in place, recommend continue port flush every 6 to 8 weeks.  I discussed with him that due to the COVID-19 pandemic, port flush may be postponed to follow medical distancing recommendation.  He voices understanding. After 2 years if he has no signs of disease recurrence, will remove port.  Follow Up Instructions: Port flush in 6 weeks, lab MD assessment in 12 weeks. Orders Placed This Encounter  Procedures  . CBC with Differential/Platelet    Standing Status:   Future    Standing Expiration Date:   11/27/2019  . Comprehensive metabolic panel    Standing Status:   Future    Standing Expiration Date:   11/27/2019  . Lactate dehydrogenase    Standing Status:   Future    Standing  Expiration Date:   11/27/2019     I discussed the assessment and treatment plan with the patient. The patient was provided an opportunity to ask questions and all were answered. The patient agreed with the plan and  demonstrated an understanding of the instructions.  The patient was advised to call back or seek an in-person evaluation if the symptoms worsen or if the condition fails to improve as anticipated.   I provided 15 minutes of face-to-face video visit time during this encounter, and > 50% was spent counseling as documented under my assessment & plan.  Earlie Server, MD 11/28/2018 3:21 PM

## 2018-12-13 DIAGNOSIS — I10 Essential (primary) hypertension: Secondary | ICD-10-CM | POA: Diagnosis not present

## 2018-12-13 DIAGNOSIS — R600 Localized edema: Secondary | ICD-10-CM | POA: Diagnosis not present

## 2018-12-28 ENCOUNTER — Other Ambulatory Visit: Payer: Self-pay | Admitting: Internal Medicine

## 2018-12-28 ENCOUNTER — Ambulatory Visit: Payer: Medicare HMO

## 2018-12-28 DIAGNOSIS — E538 Deficiency of other specified B group vitamins: Secondary | ICD-10-CM | POA: Diagnosis not present

## 2018-12-28 DIAGNOSIS — R600 Localized edema: Secondary | ICD-10-CM

## 2018-12-28 DIAGNOSIS — H9190 Unspecified hearing loss, unspecified ear: Secondary | ICD-10-CM | POA: Diagnosis not present

## 2018-12-28 DIAGNOSIS — I10 Essential (primary) hypertension: Secondary | ICD-10-CM | POA: Diagnosis not present

## 2018-12-28 DIAGNOSIS — C8339 Diffuse large B-cell lymphoma, extranodal and solid organ sites: Secondary | ICD-10-CM | POA: Diagnosis not present

## 2018-12-29 ENCOUNTER — Ambulatory Visit
Admission: RE | Admit: 2018-12-29 | Discharge: 2018-12-29 | Disposition: A | Discharge status: Home / Self Care | Payer: Medicare HMO | Source: Ambulatory Visit | Attending: Internal Medicine | Admitting: Internal Medicine

## 2018-12-29 ENCOUNTER — Other Ambulatory Visit: Payer: Self-pay

## 2018-12-29 DIAGNOSIS — R600 Localized edema: Secondary | ICD-10-CM

## 2018-12-29 DIAGNOSIS — I10 Essential (primary) hypertension: Secondary | ICD-10-CM | POA: Insufficient documentation

## 2019-01-04 ENCOUNTER — Other Ambulatory Visit: Payer: Self-pay | Admitting: *Deleted

## 2019-01-07 ENCOUNTER — Other Ambulatory Visit: Payer: Self-pay

## 2019-01-08 ENCOUNTER — Other Ambulatory Visit: Payer: Self-pay

## 2019-01-08 ENCOUNTER — Inpatient Hospital Stay: Payer: Medicare HMO | Attending: Oncology

## 2019-01-08 DIAGNOSIS — C8339 Diffuse large B-cell lymphoma, extranodal and solid organ sites: Secondary | ICD-10-CM | POA: Insufficient documentation

## 2019-01-08 DIAGNOSIS — Z452 Encounter for adjustment and management of vascular access device: Secondary | ICD-10-CM | POA: Diagnosis not present

## 2019-01-08 DIAGNOSIS — Z95828 Presence of other vascular implants and grafts: Secondary | ICD-10-CM

## 2019-01-08 MED ORDER — SODIUM CHLORIDE 0.9% FLUSH
10.0000 mL | Freq: Once | INTRAVENOUS | Status: AC
  Administered 2019-01-08: 13:00:00 10 mL via INTRAVENOUS
  Filled 2019-01-08: qty 10

## 2019-01-08 MED ORDER — HEPARIN SOD (PORK) LOCK FLUSH 100 UNIT/ML IV SOLN
500.0000 [IU] | Freq: Once | INTRAVENOUS | Status: AC
  Administered 2019-01-08: 500 [IU] via INTRAVENOUS

## 2019-01-11 ENCOUNTER — Other Ambulatory Visit: Payer: Self-pay | Admitting: *Deleted

## 2019-01-11 MED ORDER — GABAPENTIN 300 MG PO CAPS: 300.0000 mg | ORAL_CAPSULE | Freq: Every day | ORAL | 1 refills | Status: DC

## 2019-02-10 IMAGING — MR MR HEAD WO/W CM
9 of 13 series · 30 of 48 positions shown · IV contrast (multihance)
Comparison: 02/23/2017

CLINICAL DATA: Ataxia.  History of non-Hodgkin's lymphoma.

EXAM:
MRI HEAD WITHOUT AND WITH CONTRAST
TECHNIQUE: Multiplanar, multiecho pulse sequences of the brain and surrounding
structures were obtained without and with intravenous contrast.
CONTRAST:  20mL MULTIHANCE GADOBENATE DIMEGLUMINE 529 MG/ML IV SOLN

[Series 4: DWI · axial · 3.0mm · 0.94mm/px · z∈[-14,+128]mm · 3 of 54 slices shown (1 of 2)]
[im 1/54]
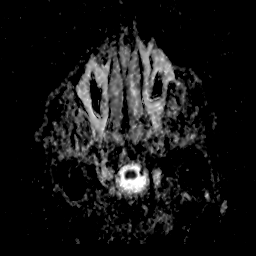
[im 27/54]
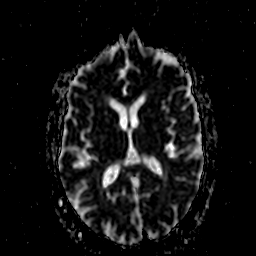
[im 54/54]
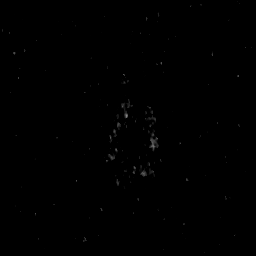

[Series 6: DWI · coronal · 5.0mm · 1.80mm/px · 3 of 39 slices shown (2 of 2)]
[im 1/39]
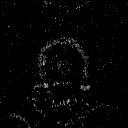
[im 20/39]
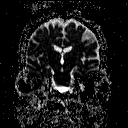
[im 39/39]
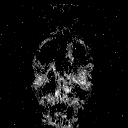

[Series 7: T2 · axial · 5.0mm · 0.45mm/px · z∈[-20,+129]mm · 2 of 25 slices shown (1 of 2)]
[im 1/25]
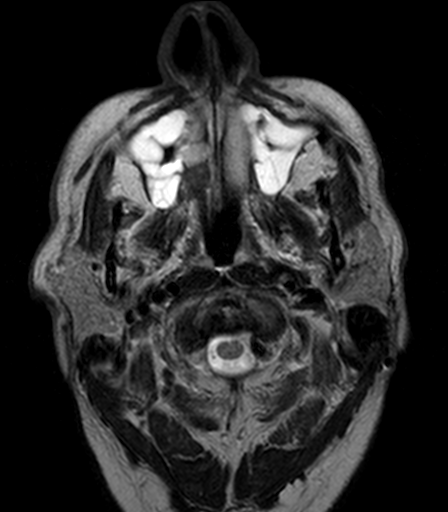
[im 25/25]
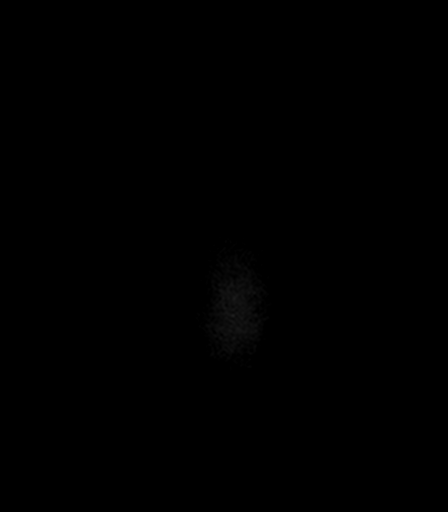

[Series 8: FLAIR · axial · 3.0mm · 0.90mm/px · z∈[-19,+117]mm · 3 of 52 slices shown]
[im 1/52]
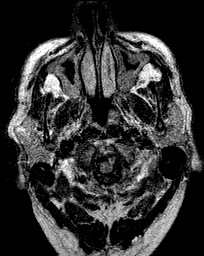
[im 26/52]
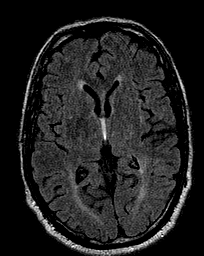
[im 52/52]
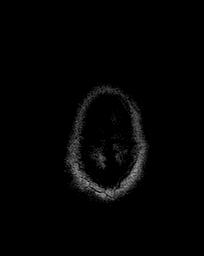

[Series 9: T2 · axial · 5.0mm · 0.45mm/px · z∈[-24,+125]mm · 2 of 25 slices shown (2 of 2)]
[im 1/25]
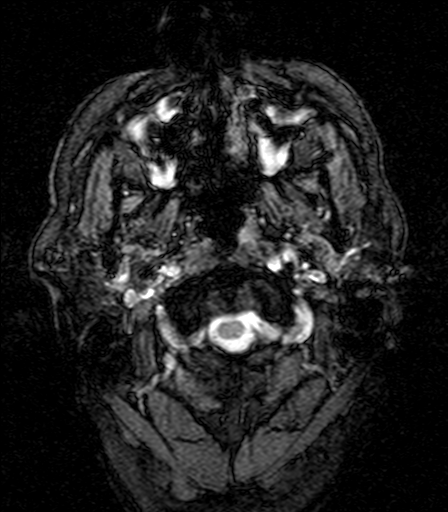
[im 25/25]
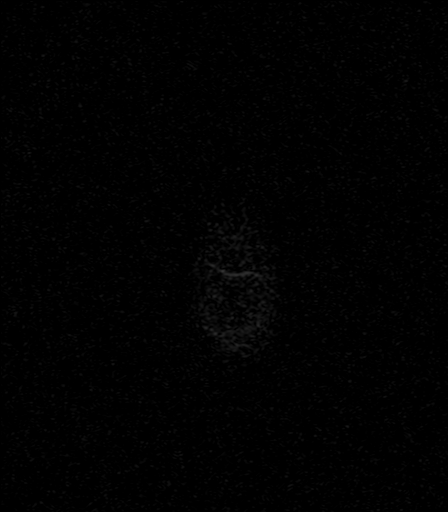

[Series 11: T2 post-contrast · coronal · 5.0mm · 0.45mm/px · 2 of 31 slices shown]
[im 1/31]
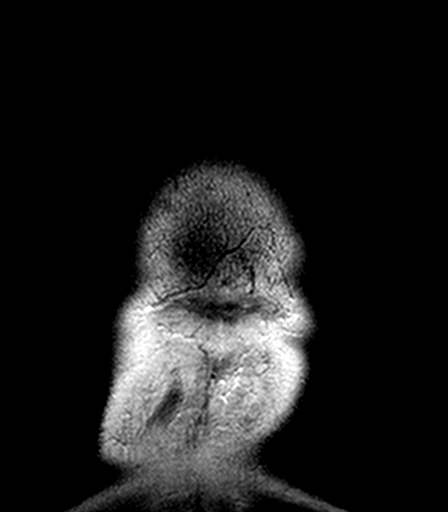
[im 31/31]
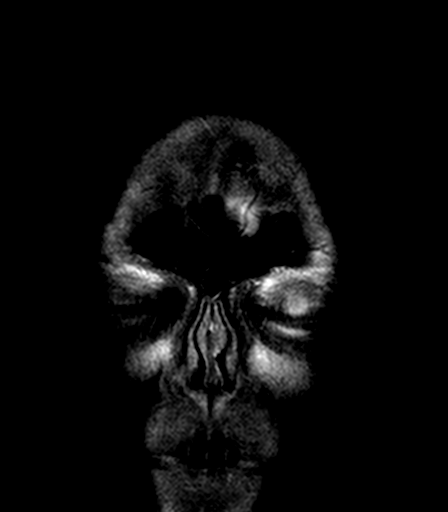

[Series 12: T1 post-contrast · axial · 1.0mm · 0.50mm/px · z∈[-18,+138]mm · 11 of 176 slices shown (1 of 3)]
[im 1/176]
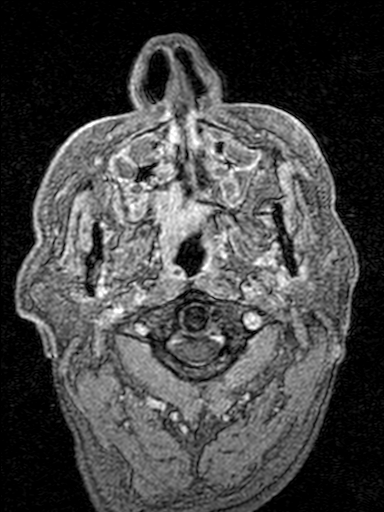
[im 18/176]
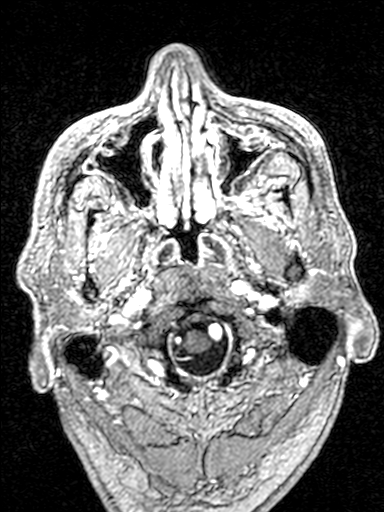
[im 36/176]
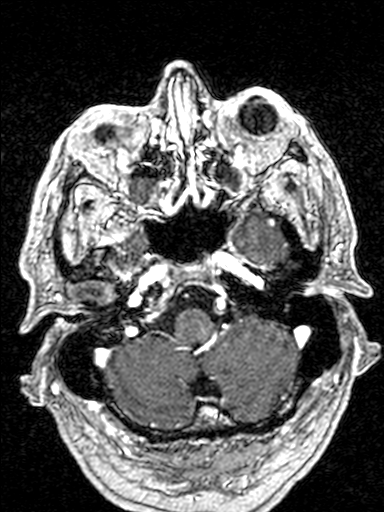
[im 53/176]
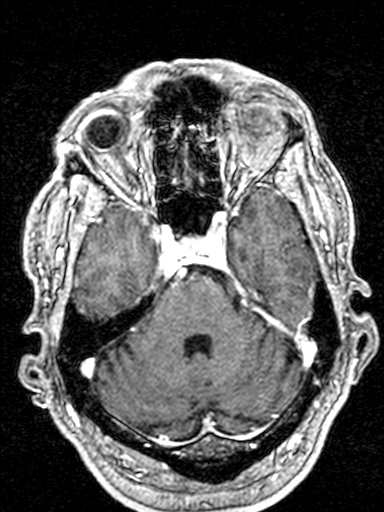
[im 71/176]
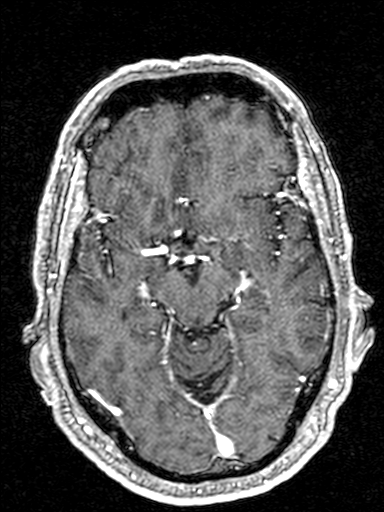
[im 88/176]
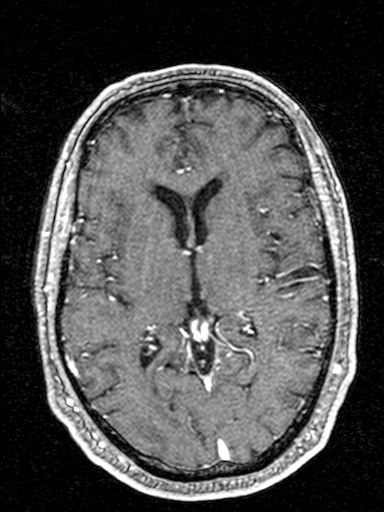
[im 106/176]
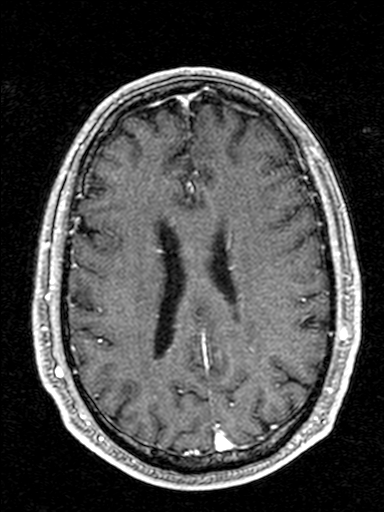
[im 123/176]
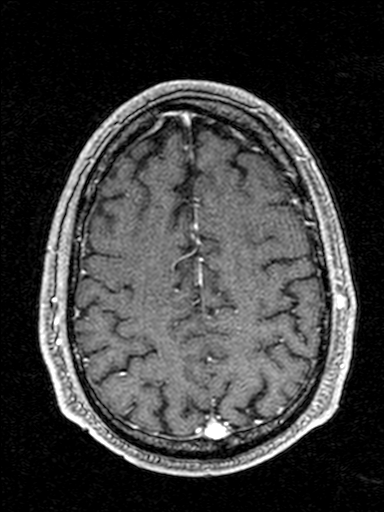
[im 141/176]
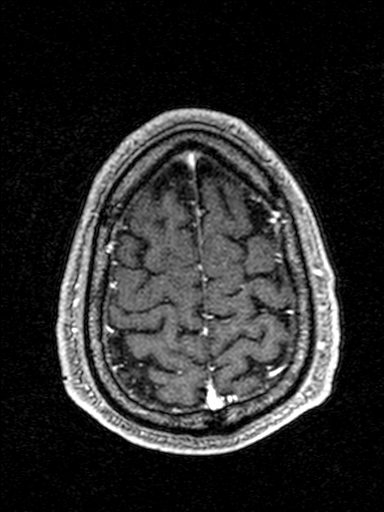
[im 158/176]
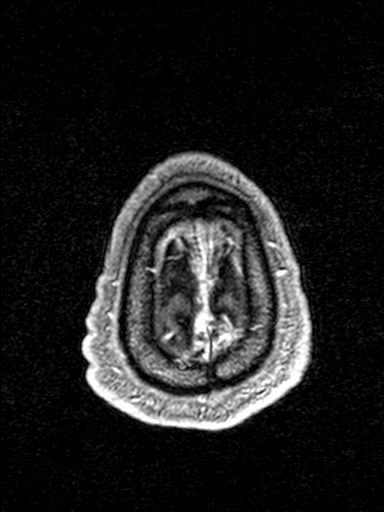
[im 176/176]
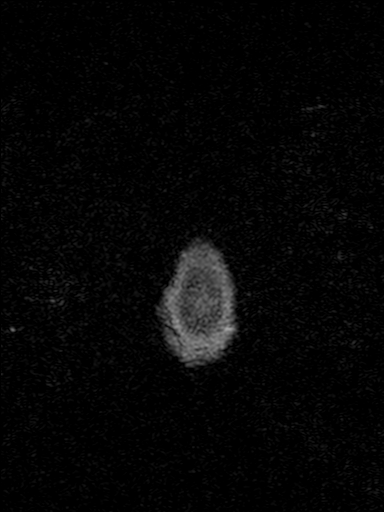

[Series 13: T1 post-contrast · coronal · 5.0mm · 0.45mm/px · 2 of 31 slices shown (2 of 3)]
[im 1/31]
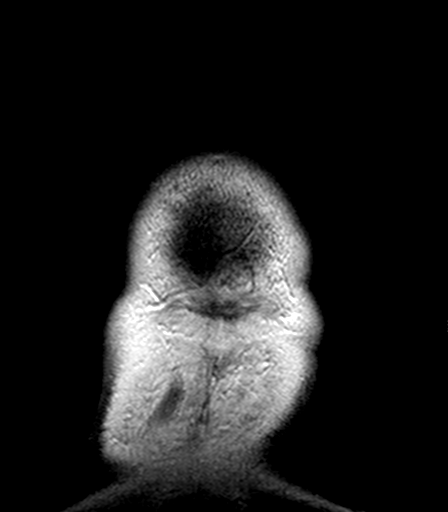
[im 31/31]
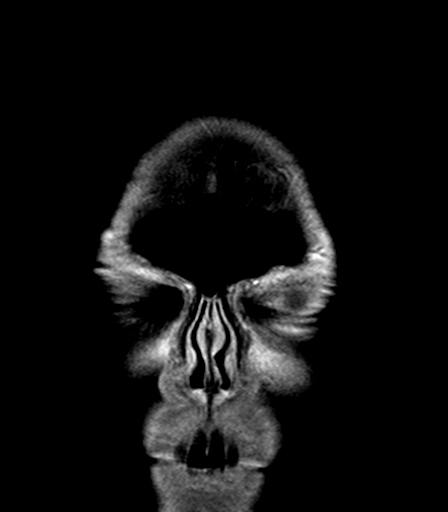

[Series 14: T1 post-contrast · sagittal · 5.0mm · 0.45mm/px · 2 of 25 slices shown (3 of 3)]
[im 1/25]
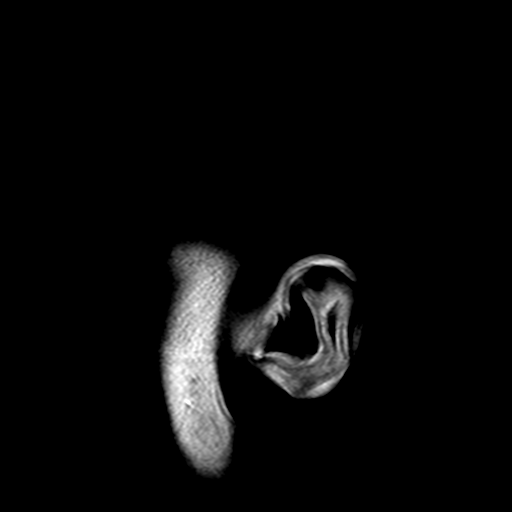
[im 25/25]
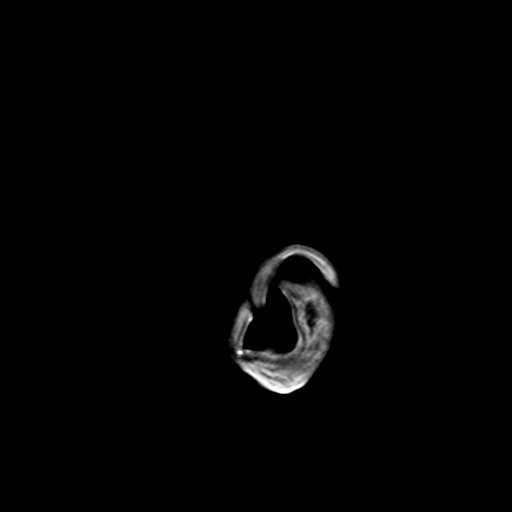

[30 of 48 positions shown; findings below may reference images not displayed]

FINDINGS: Brain: There is no evidence of acute infarct, intracranial
hemorrhage, mass, midline shift, or extra-axial fluid collection.
The ventricles and sulci are normal for age. T2 hyperintensities in
the periventricular white matter and pons are similar to the prior
study and nonspecific but compatible with mild chronic small vessel
ischemic disease. No abnormal enhancement is identified.

Vascular: Major intracranial vascular flow voids are preserved with
the left vertebral artery being dominant.

Skull and upper cervical spine: Unremarkable bone marrow signal.

Sinuses/Orbits: Unremarkable orbits. Moderate circumferential
mucosal thickening and small volume fluid in both maxillary sinuses.
Small right mastoid effusion.

Other: None.
IMPRESSION: 1. No acute intracranial abnormality or mass.
2. Mild chronic small vessel ischemic disease, unchanged.
3. Bilateral maxillary sinusitis.  Small right mastoid effusion.

## 2019-02-12 ENCOUNTER — Other Ambulatory Visit: Payer: Self-pay | Admitting: *Deleted

## 2019-02-12 MED ORDER — GABAPENTIN 300 MG PO CAPS: 300.0000 mg | ORAL_CAPSULE | Freq: Every day | ORAL | 1 refills | Status: DC

## 2019-02-13 DIAGNOSIS — C8339 Diffuse large B-cell lymphoma, extranodal and solid organ sites: Secondary | ICD-10-CM | POA: Diagnosis not present

## 2019-02-13 DIAGNOSIS — F5104 Psychophysiologic insomnia: Secondary | ICD-10-CM | POA: Diagnosis not present

## 2019-02-13 DIAGNOSIS — M1990 Unspecified osteoarthritis, unspecified site: Secondary | ICD-10-CM | POA: Diagnosis not present

## 2019-02-13 DIAGNOSIS — I10 Essential (primary) hypertension: Secondary | ICD-10-CM | POA: Diagnosis not present

## 2019-02-13 DIAGNOSIS — E7800 Pure hypercholesterolemia, unspecified: Secondary | ICD-10-CM | POA: Diagnosis not present

## 2019-02-19 ENCOUNTER — Encounter: Payer: Self-pay | Admitting: Oncology

## 2019-02-19 ENCOUNTER — Inpatient Hospital Stay: Payer: Medicare HMO | Attending: Oncology | Admitting: *Deleted

## 2019-02-19 ENCOUNTER — Inpatient Hospital Stay (HOSPITAL_BASED_OUTPATIENT_CLINIC_OR_DEPARTMENT_OTHER): Payer: Medicare HMO | Admitting: Oncology

## 2019-02-19 ENCOUNTER — Other Ambulatory Visit: Payer: Self-pay

## 2019-02-19 VITALS — BP 142/71 | HR 60 | Temp 97.8°F | Wt 230.2 lb

## 2019-02-19 DIAGNOSIS — M47816 Spondylosis without myelopathy or radiculopathy, lumbar region: Secondary | ICD-10-CM | POA: Diagnosis not present

## 2019-02-19 DIAGNOSIS — E876 Hypokalemia: Secondary | ICD-10-CM

## 2019-02-19 DIAGNOSIS — G629 Polyneuropathy, unspecified: Secondary | ICD-10-CM | POA: Insufficient documentation

## 2019-02-19 DIAGNOSIS — G8929 Other chronic pain: Secondary | ICD-10-CM

## 2019-02-19 DIAGNOSIS — M545 Low back pain, unspecified: Secondary | ICD-10-CM

## 2019-02-19 DIAGNOSIS — Z8572 Personal history of non-Hodgkin lymphomas: Secondary | ICD-10-CM | POA: Diagnosis not present

## 2019-02-19 DIAGNOSIS — M549 Dorsalgia, unspecified: Secondary | ICD-10-CM | POA: Insufficient documentation

## 2019-02-19 DIAGNOSIS — C8339 Diffuse large B-cell lymphoma, extranodal and solid organ sites: Secondary | ICD-10-CM

## 2019-02-19 DIAGNOSIS — Z95828 Presence of other vascular implants and grafts: Secondary | ICD-10-CM

## 2019-02-19 LAB — COMPREHENSIVE METABOLIC PANEL
ALT: 16 U/L (ref 0–44)
AST: 19 U/L (ref 15–41)
Albumin: 4 g/dL (ref 3.5–5.0)
Alkaline Phosphatase: 94 U/L (ref 38–126)
Anion gap: 5 (ref 5–15)
BUN: 17 mg/dL (ref 8–23)
CO2: 27 mmol/L (ref 22–32)
Calcium: 9.2 mg/dL (ref 8.9–10.3)
Chloride: 107 mmol/L (ref 98–111)
Creatinine, Ser: 1 mg/dL (ref 0.61–1.24)
GFR calc Af Amer: >60 mL/min (ref >60)
GFR calc non Af Amer: >60 mL/min (ref >60)
Glucose, Bld: 97 mg/dL (ref 70–99)
Potassium: 3.3 mmol/L — ABNORMAL LOW (ref 3.5–5.1)
Sodium: 139 mmol/L (ref 135–145)
Total Bilirubin: 1 mg/dL (ref 0.3–1.2)
Total Protein: 7.5 g/dL (ref 6.5–8.1)

## 2019-02-19 LAB — CBC WITH DIFFERENTIAL/PLATELET
Abs Immature Granulocytes: 0.03 10*3/uL (ref 0.00–0.07)
Basophils Absolute: 0 10*3/uL (ref 0.0–0.1)
Basophils Relative: 1 %
Eosinophils Absolute: 0.2 10*3/uL (ref 0.0–0.5)
Eosinophils Relative: 2 %
HCT: 39.1 % (ref 39.0–52.0)
Hemoglobin: 13.5 g/dL (ref 13.0–17.0)
Immature Granulocytes: 0 %
Lymphocytes Relative: 12 %
Lymphs Abs: 0.8 10*3/uL (ref 0.7–4.0)
MCH: 30.5 pg (ref 26.0–34.0)
MCHC: 34.5 g/dL (ref 30.0–36.0)
MCV: 88.5 fL (ref 80.0–100.0)
Monocytes Absolute: 0.6 10*3/uL (ref 0.1–1.0)
Monocytes Relative: 9 %
Neutro Abs: 5.1 10*3/uL (ref 1.7–7.7)
Neutrophils Relative %: 76 %
Platelets: 175 10*3/uL (ref 150–400)
RBC: 4.42 MIL/uL (ref 4.22–5.81)
RDW: 13 % (ref 11.5–15.5)
WBC: 6.8 10*3/uL (ref 4.0–10.5)
nRBC: 0 % (ref 0.0–0.2)

## 2019-02-19 LAB — LACTATE DEHYDROGENASE: LDH: 126 U/L (ref 98–192)

## 2019-02-19 MED ORDER — POTASSIUM CHLORIDE CRYS ER 20 MEQ PO TBCR: 20.0000 meq | EXTENDED_RELEASE_TABLET | Freq: Every day | ORAL | 0 refills | Status: DC

## 2019-02-19 MED ORDER — SODIUM CHLORIDE 0.9% FLUSH
10.0000 mL | Freq: Once | INTRAVENOUS | Status: AC
  Administered 2019-02-19: 10 mL via INTRAVENOUS
  Filled 2019-02-19: qty 10

## 2019-02-19 MED ORDER — HEPARIN SOD (PORK) LOCK FLUSH 100 UNIT/ML IV SOLN
500.0000 [IU] | Freq: Once | INTRAVENOUS | Status: AC
  Administered 2019-02-19: 13:00:00 500 [IU] via INTRAVENOUS

## 2019-02-19 NOTE — Progress Notes (Addendum)
Hematology/Oncology follow-up note [name] [phone]   Patient Care Team: John Harrier, MD as PCP - General (Internal Medicine) John Server, MD as Consulting Physician (Oncology) John Cowboy Erskine Squibb, MD as Referring Physician (Vascular Surgery) John Sons, MD (Urology)  REFERRING PROVIDER: Tracie Harrier, MD  REASON FOR VISIT Follow up for surveillance for diffuse large B-cell lymphoma.  HISTORY OF PRESENTING ILLNESS:  John Gallegos is a  73 y.o.  male with PMH listed below presented for follow-up for treatment of diffuse large B cell lymphoma.  Pertinent history of oncology workup includes: Patient was found to have microscopic hematuria on 02/23/2017 with 4-10 RBC's/hpf.    Urine culture was negative.having symptoms of nocturia He is smoker. 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 He was in the [employer] and reports to be exposed to [place] in [place]. CT scan 04/20/2017 which revealed moderate left hydronephrosis and delayed excretion down to a large 4.4 cm mass in or around the left proximal ureter. There was also hazy soft tissue in the fat surrounding the celiac axis and superior mesenteric artery. Thickening of the right diaphragmatic crus. High gastrohepatic ligament lymphadenopathy measures up to 11 mm. Peripancreatic and root of small bowel mesentery adenopathy measures up to 2.6 cm.  He was seen by urologist John Gallegos and he underwent cystoscopy, left retrograde pyelogram, left ureteroscopy and placement of left ureteral stent placement on 05/13/2017. Finding during the procedure that he has extrinsic compression of the ureter, possibly from lymphoma.  # Pathology:  Biopsy of paraspinal musculature at L2. Pathology results reviewed large B cell lymphoma. It is CD20 positive, CD10 positive. Flow cytometry shows a small clonal population of large B cells positive for CD10 and absent cop and lambda light  chains. The abnormal cells represent 2% of the total cells.The morphology, initial IHC panel, and flow cytometry supports classification as B-cell lymphoma, CD10 positive, with large cell features.diffuse large B-cell lymphoma, germinal center B-cell type. Discussed with pathologist JohnOnley, Ki67 50%, MUM1 negative, MYC negative, Cycline D1 negative, CD30 negative, FISH is positive for BCL2 gene rearrangement. Negative for MYC and BCL6. 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   # Bone marrow Biopsy: negative for lymphoma involvement.  # PET scan 05/18/2017  IMPRESSION: 1. Multifocal metastatic disease within the LEFT retroperitoneum, central mesenteries, and RIGHT paraspinal musculature. 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 Recommend percutaneous biopsy of the RIGHT paraspinal musculature at L2. 2. 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 # Interim PET scan 07/28/2017 after 2 cycles of RCHOP 1. Marked improvement, with row solution of the prior bony lesions and significant reduction in activity of the central mesenteric and right gastric adenopathy. Central mesenteric nodal is currently Deauville 3, previously Deauville 4 and Deauville 5. 2. Improvement in the right paraspinal muscular activity, currently Deauville 3, previously Deauville 4. I also discussed with radiologist that there has been further improvement in the left periureteral density, likely reflecting treatment response.   # Prognostic Model for the risk of CNS lymphoma involvement per NCCN guideline,  Patient is intermediate risk with score of 2, one from being >60, and one from more than extranodal involvement>1 sites (bone, paraspinal muscle involvement). CNS prophylaxis is usually recommended if score 4-6.  CNS prophylaxis was not offered.    #08/04/2018 CT abdomen pelvis showed no evidence of acute abnormality.  Stable stranding haziness at the root of  mesentery  and retroperitoneum.  No new or enlarged lymph nodes identified.  Small to moderate umbilical hernia containing fat, moderate right lower lobe atelectasis and a small loculated right pleural effusion again noted.  Cholelithiasis without CT evidence of acute cholecystitis.  Prostate enlargement.  Aortic atherosclerosis.    # Treatment:  06/21/2017 Cycle 1 RCHOP John Gallegos. Dexamethasone 34m on Day 1,  Prednisone 80 mg daily Day 2-5. 07/12/2017 Cycle 2 RCHOP /John Gallegos.Dexamethasone 28mon Day 1, Prednisone 80 mg daily Day 2-5 08/03/2017 Cycle 3 RCHOP/onpro.Dexamethasone 2074mn Day 1, Prednisone 80 mg daily Day 2-5  08/24/2017 Cycle 4 RCHOP/onpro.Dexamethasone 55m75m Day 1, Prednisone 80 mg daily Day 2-5  09/14/2017 Cycle 5 RCHOP/onpro.Dexamethasone 55mg62mDay 1, Prednisone 80 mg daily Day 2-5  10/05/2017 Cycle 6 RCHOP/onpro.Dexamethasone 55mg 54may 1, Prednisone 80 mg daily Day 2-5  INTERVAL HISTORY 73 y.o38male with oncology history listed as above reviewed by me today presents for follow-up for diffuse large B-cell lymphoma. Patient had virtual visit with me on 11/27/2018. Today he reports feeling well at baseline.  Denies any fatigue, fever, chills, night sweating, or unintentional weight loss Denies any new complaints. Continue to have chronic pain as needles of lower extremity.  Takes gabapentin intermittently.  He reports that recently his blood pressure was not well controlled and JohnHande  adjusted his hypertension medication. Currently he is on losartan/HCT 100/25 1 tablet daily.  Lower extremity swelling has improved since the change of medication. He mentioned intermittent chronic back pain, doing well.  Not worse.  Review of Systems  Constitutional: Positive for fatigue. Negative for appetite change, chills, fever and unexpected weight change.  HENT:   Negative for hearing loss and voice change.   Eyes: Negative for eye problems and icterus.  Respiratory: Negative for chest  tightness, cough and shortness of breath.   Cardiovascular: Negative for chest pain and leg swelling.  Gastrointestinal: Negative for abdominal distention and abdominal pain.  Endocrine: Negative for hot flashes.  Genitourinary: Negative for difficulty urinating, dysuria and frequency.   Musculoskeletal: Negative for arthralgias.  Skin: Negative for itching and rash.  Neurological: Positive for numbness. Negative for light-headedness.  Hematological: Negative for adenopathy. Does not bruise/bleed easily.  Psychiatric/Behavioral: Negative for confusion.     MEDICAL HISTORY:  Past Medical History:  Diagnosis Date  . Gout   . Hard of hearing   . Hiatal hernia   . Hypertension   . Non Hodgkin's lymphoma (HCC) [id number]   B-cell, chemo tx's and in remission in 2019  . Urinary incontinence     SURGICAL HISTORY: Past Surgical History:  Procedure Laterality Date  . CYSTOSCOPY W/ RETROGRADES Left 08/19/2017   Procedure: CYSTOSCOPY WITH RETROGRADE PYELOGRAM;  Surgeon: StoiofAbbie Gallegos Location: [facility];  Service: Urology;  Laterality: Left;  . CYSTOSCOPY W/ URETERAL STENT REMOVAL Left 08/19/2017   Procedure: CYSTOSCOPY WITH STENT REMOVAL;  Surgeon: StoiofAbbie Gallegos Location: [facility];  Service: Urology;  Laterality: Left;  . CYSTOSCOPY WITH BIOPSY Left 05/13/2017   Procedure: CYSTOSCOPY WITH URETERAL BIOPSY;  Surgeon: BudzynNickie Retort Location: [facility];  Service: Urology;  Laterality: Left;  . CYSTOSCOPY WITH STENT PLACEMENT Left 05/13/2017   Procedure: CYSTOSCOPY WITH STENT PLACEMENT;  Surgeon: BudzynNickie Retort Location: [facility];  Service: Urology;  Laterality: Left;  . PORTA CATH INSERTION N/A 06/14/2017   Procedure: PORTA CATH INSERTION;  Surgeon: Dew, JAlgernon Huxley [facility]: [facility];  Service: Cardiovascular;  Laterality: N/A;  . 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 right knne surgery     needle went through knee  . TONSILLECTOMY    .  URETEROSCOPY Left 05/13/2017   Procedure: URETEROSCOPY;  Surgeon: Nickie Retort, MD;  Location: [facility];  Service: Urology;  Laterality: Left;    SOCIAL HISTORY: Social History   Socioeconomic History  . Marital status: Married    Spouse name: Not on file  . Number of children: Not on file  . Years of education: Not on file  . Highest education level: Not on file  Occupational History  . Not on file  Social Needs  . Financial resource strain: Not on file  . Food insecurity    Worry: Not on file    Inability: Not on file  . Transportation needs    Medical: Not on file    Non-medical: Not on file  Tobacco Use  . Smoking status: Former Smoker    Packs/day: 0.50    Years: 50.00    Pack years: 25.00    Types: Cigarettes  . Smokeless tobacco: Never Used  Substance and Sexual Activity  . Alcohol use: Yes    Alcohol/week: 3.0 standard drinks    Types: 3 Shots of liquor per week    Comment: occ. beer  . Drug use: No  . Sexual activity: Not on file  Lifestyle  . Physical activity    Days per week: Not on file    Minutes per session: Not on file  . Stress: Not on file  Relationships  . Social Herbalist on phone: Not on file    Gets together: Not on file    Attends religious service: Not on file    Active member of club or organization: Not on file    Attends meetings of clubs or organizations: Not on file    Relationship status: Not on file  . Intimate partner violence    Fear of current or ex partner: Not on file    Emotionally abused: Not on file    Physically abused: Not on file    Forced sexual activity: Not on file  Other Topics Concern  . Not on file  Social History Narrative  . Not on file    FAMILY HISTORY: Family History  Problem Relation Age of Onset  . Pancreatic cancer Mother   . Aneurysm Mother   . Heart attack Mother   . Lung cancer Father   . Lymphoma Sister   . Prostate cancer Neg Hx   . Kidney cancer Neg Hx   . Bladder  Cancer Neg Hx     ALLERGIES:  has No Known Allergies.  MEDICATIONS:  Current Outpatient Medications  Medication Sig Dispense Refill  . acetaminophen (TYLENOL) 500 MG tablet Take 500 mg by mouth every 6 (six) hours as needed for mild pain or moderate pain.    Marland Kitchen amLODipine (NORVASC) 5 MG tablet Take 5 mg by mouth daily.    Marland Kitchen aspirin EC 81 MG tablet Take 81 mg by mouth daily.    . diphenhydrAMINE (BENADRYL) 50 MG tablet Take 0.5 tablets (25 mg total) by mouth every 8 (eight) hours as needed for itching or allergies. 20 tablet 0  . EPINEPHrine 0.3 mg/0.3 mL IJ SOAJ injection INJECT 0.3 MLS INTO THE MUSCLE ONCE PRN  3  . ezetimibe (ZETIA) 10 MG tablet Take 10 mg by mouth daily.    Marland Kitchen gabapentin (NEURONTIN) 300  MG capsule Take 1 capsule (300 mg total) by mouth at bedtime. 90 capsule 1  . hydrALAZINE (APRESOLINE) 25 MG tablet Take 25 mg by mouth every evening.     Marland Kitchen losartan-hydrochlorothiazide (HYZAAR) 100-25 MG tablet TK 1 T PO QD    . pravastatin (PRAVACHOL) 80 MG tablet Take 1 tablet (80 mg total) by mouth every evening. 90 tablet 3  . zolpidem (AMBIEN) 10 MG tablet Take 10 mg by mouth at bedtime as needed for sleep.     . potassium chloride SA (K-DUR) 20 MEQ tablet Take 1 tablet (20 mEq total) by mouth daily. 3 tablet 0   No current facility-administered medications for this visit.       Marland Kitchen  PHYSICAL EXAMINATION: ECOG PERFORMANCE STATUS: 1 - Symptomatic but completely ambulatory Vitals:   02/19/19 1405  BP: (!) 142/71  Pulse: 60  Temp: 97.8 F (36.6 C)   Filed Weights   02/19/19 1405  Weight: 230 lb 3 oz (104.4 kg)    Physical Exam  Constitutional: He is oriented to person, place, and time. No distress.  HENT:  Head: Normocephalic and atraumatic.  Nose: Nose normal.  Mouth/Throat: Oropharynx is clear and moist. No oropharyngeal exudate.  Eyes: Pupils are equal, round, and reactive to light. Conjunctivae and EOM are normal. Right eye exhibits no discharge. Left eye exhibits  no discharge. No scleral icterus.  Neck: Normal range of motion. Neck supple. No JVD present.  Cardiovascular: Normal rate, regular rhythm and normal heart sounds. Exam reveals no gallop.  No murmur heard. Pulmonary/Chest: Effort normal and breath sounds normal. No respiratory distress. He has no wheezes. He has no rales. He exhibits no tenderness.  Abdominal: Soft. Bowel sounds are normal. He exhibits no distension and no mass. There is no abdominal tenderness. There is no rebound.  Musculoskeletal: Normal range of motion.        General: No tenderness, deformity or edema.  Lymphadenopathy:    He has no cervical adenopathy.  Neurological: He is alert and oriented to person, place, and time. No cranial nerve deficit. He exhibits normal muscle tone. Gait normal. Coordination normal.  Skin: Skin is warm and dry. No rash noted. He is not diaphoretic. No erythema.  Scattered chronic skin pigmentation Skin tag  Psychiatric: Affect and judgment normal.    LABORATORY DATA:  I have reviewed the data as listed Lab Results  Component Value Date   WBC 6.8 02/19/2019   HGB 13.5 02/19/2019   HCT 39.1 02/19/2019   MCV 88.5 02/19/2019   PLT 175 02/19/2019   Recent Labs    08/23/18 1305 11/24/18 1046 02/19/19 1312  NA 142 140 139  K 3.7 3.5 3.3*  CL 109 109 107  CO2 _0 GLUCOSE 103* 104* 97  BUN _1 CREATININE 0.91 1.15 1.00  CALCIUM 9.1 8.9 9.2  GFRNONAA >60 >60 >60  GFRAA >60 >60 >60  PROT 7.0 7.4 7.5  ALBUMIN 4.1 4.1 4.0  AST _2 ALT _3 ALKPHOS 103 102 94  BILITOT 0.9 0.6 1.0   Hepatitis B antigen negative. 2-D echo.showed LVEF 60%  RADIOGRAPHIC STUDIES: I have personally reviewed the radiological images as listed and agreed with the findings in the report. 08/04/2018 CT abdomen and pelvis w contrast 1. No evidence of acute abnormality. 2. Stable stranding/haziness at the root of the mesentery and retroperitoneum. No new or enlarged lymph nodes  identified. 3. Small to moderate umbilical hernia containing  fat, moderate RIGHT LOWER lobe atelectasis and small loculated RIGHT pleural effusion again noted. 4. Cholelithiasis without CT evidence of acute cholecystitis. 5. Prostate enlargement 6.  Aortic Atherosclerosis (ICD10-I70.0).  ASSESSMENT & PLAN:  This is a 73 y.o. male with stage IV diffuse large B-cell lymphoma, s/p first-line chemotherapy with R CHOP with curative intent, present for follow-up.  1. Diffuse large B-cell lymphoma of extranodal site excluding spleen and other solid organs (Progress)   2. Port-A-Cath in place   3. Neuropathy   4. Right-sided low back pain without sciatica, unspecified chronicity    # DLBCL, in complete remission.   Labs reviewed and discussed with patient. Patient has been doing well. No clinical evidence of disease recurrence. LDH remains stable. Recommend continue history and physical examination every 3 months for the first 2 years after chemo.  #Chronic back pain, 09/06/2018 MRI lumbar spine showed diffuse lumbar spine spondylosis.  No aggressive osseous lesion involving the lumbar spine. # Hypokalemia, KCL 46mq daily x 3. Recommend patient to follow up with PCP- he has appt with JohnHande in August.  Encourage potassium rich food, discussed.   # Neuropathy, recommend continue Neurontin 3080mdaily.  # Medi port flushes every 6-8 week weeks scheduled.   We discussed about keeping Mediport for the time being.  After he remains in remission 1 year and 5 months after last dose of chemotherapy. Medi port removal is an option for him. He feels due to COVID 19, he would rather keep the port and continue port flush.   Return of visit: lab (cbc, cmp, LDH, uric acid) /MD in 3 months.  Orders Placed This Encounter  Procedures  . CBC with Differential/Platelet    Standing Status:   Future    Standing Expiration Date:   02/19/2020  . Comprehensive metabolic panel    Standing Status:   Future     Standing Expiration Date:   02/19/2020  . Lactate dehydrogenase    Standing Status:   Future    Standing Expiration Date:   02/19/2020   ZhEarlie ServerMD, PhD Hematology Oncology [facility]- [id number]/13/20

## 2019-02-19 NOTE — Progress Notes (Signed)
Patient here today for follow up.  Patient states no new concerns today  

## 2019-03-01 DIAGNOSIS — M25561 Pain in right knee: Secondary | ICD-10-CM | POA: Diagnosis not present

## 2019-03-01 DIAGNOSIS — M11261 Other chondrocalcinosis, right knee: Secondary | ICD-10-CM | POA: Diagnosis not present

## 2019-03-01 DIAGNOSIS — M25461 Effusion, right knee: Secondary | ICD-10-CM | POA: Diagnosis not present

## 2019-03-01 DIAGNOSIS — M1711 Unilateral primary osteoarthritis, right knee: Secondary | ICD-10-CM | POA: Diagnosis not present

## 2019-03-01 DIAGNOSIS — I7090 Unspecified atherosclerosis: Secondary | ICD-10-CM | POA: Diagnosis not present

## 2019-03-05 ENCOUNTER — Other Ambulatory Visit
Admission: RE | Admit: 2019-03-05 | Discharge: 2019-03-05 | Disposition: A | Discharge status: Home / Self Care | Payer: Medicare HMO | Source: Ambulatory Visit | Attending: Student | Admitting: Student

## 2019-03-05 DIAGNOSIS — M25461 Effusion, right knee: Secondary | ICD-10-CM | POA: Insufficient documentation

## 2019-03-05 DIAGNOSIS — M1711 Unilateral primary osteoarthritis, right knee: Secondary | ICD-10-CM | POA: Diagnosis not present

## 2019-03-05 LAB — SYNOVIAL CELL COUNT + DIFF, W/ CRYSTALS
Crystals, Fluid: NONE SEEN
Lymphocytes-Synovial Fld: 62 %
Monocyte-Macrophage-Synovial Fluid: 14 %
Neutrophil, Synovial: 24 %
WBC, Synovial: 499 /mm3 — ABNORMAL HIGH (ref 0–200)

## 2019-03-27 DIAGNOSIS — E7800 Pure hypercholesterolemia, unspecified: Secondary | ICD-10-CM | POA: Diagnosis not present

## 2019-03-27 DIAGNOSIS — M1990 Unspecified osteoarthritis, unspecified site: Secondary | ICD-10-CM | POA: Diagnosis not present

## 2019-03-27 DIAGNOSIS — F5104 Psychophysiologic insomnia: Secondary | ICD-10-CM | POA: Diagnosis not present

## 2019-03-27 DIAGNOSIS — I10 Essential (primary) hypertension: Secondary | ICD-10-CM | POA: Diagnosis not present

## 2019-04-03 DIAGNOSIS — M47816 Spondylosis without myelopathy or radiculopathy, lumbar region: Secondary | ICD-10-CM | POA: Diagnosis not present

## 2019-04-03 DIAGNOSIS — C8339 Diffuse large B-cell lymphoma, extranodal and solid organ sites: Secondary | ICD-10-CM | POA: Diagnosis not present

## 2019-04-03 DIAGNOSIS — M545 Low back pain: Secondary | ICD-10-CM | POA: Diagnosis not present

## 2019-04-03 DIAGNOSIS — E876 Hypokalemia: Secondary | ICD-10-CM | POA: Diagnosis not present

## 2019-04-03 DIAGNOSIS — I10 Essential (primary) hypertension: Secondary | ICD-10-CM | POA: Diagnosis not present

## 2019-04-03 DIAGNOSIS — Z0000 Encounter for general adult medical examination without abnormal findings: Secondary | ICD-10-CM | POA: Diagnosis not present

## 2019-04-03 DIAGNOSIS — M47814 Spondylosis without myelopathy or radiculopathy, thoracic region: Secondary | ICD-10-CM | POA: Diagnosis not present

## 2019-04-03 DIAGNOSIS — M5489 Other dorsalgia: Secondary | ICD-10-CM | POA: Diagnosis not present

## 2019-04-03 DIAGNOSIS — E7800 Pure hypercholesterolemia, unspecified: Secondary | ICD-10-CM | POA: Diagnosis not present

## 2019-04-03 DIAGNOSIS — M1990 Unspecified osteoarthritis, unspecified site: Secondary | ICD-10-CM | POA: Diagnosis not present

## 2019-04-03 DIAGNOSIS — G8929 Other chronic pain: Secondary | ICD-10-CM | POA: Diagnosis not present

## 2019-04-10 DIAGNOSIS — E876 Hypokalemia: Secondary | ICD-10-CM | POA: Diagnosis not present

## 2019-04-17 DIAGNOSIS — E785 Hyperlipidemia, unspecified: Secondary | ICD-10-CM | POA: Diagnosis not present

## 2019-04-17 DIAGNOSIS — K429 Umbilical hernia without obstruction or gangrene: Secondary | ICD-10-CM | POA: Diagnosis not present

## 2019-04-17 DIAGNOSIS — E876 Hypokalemia: Secondary | ICD-10-CM | POA: Diagnosis not present

## 2019-04-17 DIAGNOSIS — M1990 Unspecified osteoarthritis, unspecified site: Secondary | ICD-10-CM | POA: Diagnosis not present

## 2019-04-17 DIAGNOSIS — M419 Scoliosis, unspecified: Secondary | ICD-10-CM | POA: Diagnosis not present

## 2019-04-17 DIAGNOSIS — H9190 Unspecified hearing loss, unspecified ear: Secondary | ICD-10-CM | POA: Diagnosis not present

## 2019-04-17 DIAGNOSIS — D649 Anemia, unspecified: Secondary | ICD-10-CM | POA: Diagnosis not present

## 2019-04-17 DIAGNOSIS — I10 Essential (primary) hypertension: Secondary | ICD-10-CM | POA: Diagnosis not present

## 2019-04-17 DIAGNOSIS — C8330 Diffuse large B-cell lymphoma, unspecified site: Secondary | ICD-10-CM | POA: Diagnosis not present

## 2019-05-01 ENCOUNTER — Ambulatory Visit: Payer: Self-pay | Admitting: Surgery

## 2019-05-01 DIAGNOSIS — K429 Umbilical hernia without obstruction or gangrene: Secondary | ICD-10-CM | POA: Diagnosis not present

## 2019-05-01 NOTE — H&P (View-Only) (Signed)
Subjective:  CC: Umbilical hernia without obstruction and without gangrene [K42.9]  HPI:  John Gallegos is a 73 y.o. male who was referred by Lisa Roca* for evaluation of above. Symptoms were first noted several years ago. Pain is dull, achy and intermittent, confined to the umbilicus, without radiation. Associated with nothing specific, exacerbated by nothing specific Lump is reducible.  Past Medical History: has a past medical history of Arthritis, HOH (hard of hearing), Hyperlipidemia, Hypertension, and Insomnia.  Past Surgical History:       Past Surgical History:  Procedure Laterality Date  . TONSILLECTOMY    pyloric stenosis as infant  Family History: family history includes HIV in his brother; Lung cancer in his father; Pancreatic cancer in his mother.  Social History: reports that he quit smoking about 2 years ago. His smoking use included cigarettes. He has a 12.50 pack-year smoking history. He has never used smokeless tobacco. He reports current alcohol use. He reports that he does not use drugs.  Current Medications: has a current medication list which includes the following prescription(s): amlodipine, aspirin, carvedilol, epinephrine, ezetimibe, hydralazine, losartan-hydrochlorothiazide, potassium chloride, tramadol, and zolpidem.  Allergies:     Allergies as of 05/01/2019  . (No Known Allergies)  ROS:  A 15 point review of systems was performed and pertinent positives and negatives noted in HPI  Objective:  BP 165/88  Pulse 63  Ht 185.4 cm (6\' 1" )  Wt (!) 107 kg (236 lb)  BMI 31.14 kg/m  Constitutional :  alert, appears stated age, cooperative and no distress  Lymphatics/Throat:  no asymmetry, masses, or scars  Respiratory:  clear to auscultation bilaterally  Cardiovascular:  regular rate and rhythm  Gastrointestinal: soft, non-tender; bowel sounds normal; no masses, no organomegaly. umbilical hernia noted. large, reducible and no overlying skin  changes  Musculoskeletal: Steady gait and movement  Skin: Cool and moist,   Psychiatric: Normal affect, non-agitated, not confused     LABS:  n/a  RADS:  n/a  Assessment:  Umbilical hernia without obstruction and without gangrene [K42.9]  Plan:  1. Umbilical hernia without obstruction and without gangrene [K42.9]  Discussed the risk of surgery including recurrence, which can be up to 50% in the case of incisional or complex hernias, possible use of prosthetic materials (mesh) and the increased risk of mesh infxn if used, bleeding, chronic pain, post-op infxn, post-op SBO or ileus, and possible re-operation to address said risks. The risks of general anesthetic, if used, includes MI, CVA, sudden death or even reaction to anesthetic medications also discussed. Alternatives include continued observation. Benefits include possible symptom relief, prevention of incarceration, strangulation, enlargement in size over time, and the risk of emergency surgery in the face of strangulation.  Typical post-op recovery time of 3-5 days with 4-6 weeks of activity restrictions were also discussed.  ED return precautions given for sudden increase in pain, size of hernia with accompanying fever, nausea, and/or vomiting.  The patient verbalized understanding and all questions were answered to the patient's satisfaction.  2. Patient has elected to proceed with surgical treatment. Procedure will be scheduled. Written consent was obtained. Robotic assisted

## 2019-05-01 NOTE — H&P (Signed)
Subjective:  CC: Umbilical hernia without obstruction and without gangrene [K42.9]  HPI:  John Gallegos is a 73 y.o. male who was referred by Lisa Roca* for evaluation of above. Symptoms were first noted several years ago. Pain is dull, achy and intermittent, confined to the umbilicus, without radiation. Associated with nothing specific, exacerbated by nothing specific Lump is reducible.  Past Medical History: has a past medical history of Arthritis, HOH (hard of hearing), Hyperlipidemia, Hypertension, and Insomnia.  Past Surgical History:       Past Surgical History:  Procedure Laterality Date  . TONSILLECTOMY    pyloric stenosis as infant  Family History: family history includes HIV in his brother; Lung cancer in his father; Pancreatic cancer in his mother.  Social History: reports that he quit smoking about 2 years ago. His smoking use included cigarettes. He has a 12.50 pack-year smoking history. He has never used smokeless tobacco. He reports current alcohol use. He reports that he does not use drugs.  Current Medications: has a current medication list which includes the following prescription(s): amlodipine, aspirin, carvedilol, epinephrine, ezetimibe, hydralazine, losartan-hydrochlorothiazide, potassium chloride, tramadol, and zolpidem.  Allergies:     Allergies as of 05/01/2019  . (No Known Allergies)  ROS:  A 15 point review of systems was performed and pertinent positives and negatives noted in HPI  Objective:  BP 165/88  Pulse 63  Ht 185.4 cm (6\' 1" )  Wt (!) 107 kg (236 lb)  BMI 31.14 kg/m  Constitutional :  alert, appears stated age, cooperative and no distress  Lymphatics/Throat:  no asymmetry, masses, or scars  Respiratory:  clear to auscultation bilaterally  Cardiovascular:  regular rate and rhythm  Gastrointestinal: soft, non-tender; bowel sounds normal; no masses, no organomegaly. umbilical hernia noted. large, reducible and no overlying skin  changes  Musculoskeletal: Steady gait and movement  Skin: Cool and moist,   Psychiatric: Normal affect, non-agitated, not confused     LABS:  n/a  RADS:  n/a  Assessment:  Umbilical hernia without obstruction and without gangrene [K42.9]  Plan:  1. Umbilical hernia without obstruction and without gangrene [K42.9]  Discussed the risk of surgery including recurrence, which can be up to 50% in the case of incisional or complex hernias, possible use of prosthetic materials (mesh) and the increased risk of mesh infxn if used, bleeding, chronic pain, post-op infxn, post-op SBO or ileus, and possible re-operation to address said risks. The risks of general anesthetic, if used, includes MI, CVA, sudden death or even reaction to anesthetic medications also discussed. Alternatives include continued observation. Benefits include possible symptom relief, prevention of incarceration, strangulation, enlargement in size over time, and the risk of emergency surgery in the face of strangulation.  Typical post-op recovery time of 3-5 days with 4-6 weeks of activity restrictions were also discussed.  ED return precautions given for sudden increase in pain, size of hernia with accompanying fever, nausea, and/or vomiting.  The patient verbalized understanding and all questions were answered to the patient's satisfaction.  2. Patient has elected to proceed with surgical treatment. Procedure will be scheduled. Written consent was obtained. Robotic assisted

## 2019-05-15 ENCOUNTER — Encounter
Admission: RE | Admit: 2019-05-15 | Discharge: 2019-05-15 | Disposition: A | Discharge status: Home / Self Care | Payer: Medicare HMO | Source: Ambulatory Visit | Attending: Surgery | Admitting: Surgery

## 2019-05-15 ENCOUNTER — Other Ambulatory Visit: Payer: Self-pay

## 2019-05-15 DIAGNOSIS — Z01812 Encounter for preprocedural laboratory examination: Secondary | ICD-10-CM | POA: Insufficient documentation

## 2019-05-15 DIAGNOSIS — Z20828 Contact with and (suspected) exposure to other viral communicable diseases: Secondary | ICD-10-CM | POA: Diagnosis not present

## 2019-05-15 NOTE — Patient Instructions (Signed)
Your procedure is scheduled on: Friday 10/9 Report to Day Surgery. To find out your arrival time please call [phone] between 1PM - 3PM on thurs 10/8.  Remember: Instructions that are not followed completely may result in serious medical risk,  up to and including death, or upon the discretion of your surgeon and anesthesiologist your  surgery may need to be rescheduled.     _X__ 1. Do not eat food after midnight the night before your procedure.                 No gum chewing or hard candies. You may drink clear liquids up to 2 hours                 before you are scheduled to arrive for your surgery- DO not drink clear                 liquids within 2 hours of the start of your surgery.                 Clear Liquids include:  water, apple juice without pulp, clear carbohydrate                 drink such as Clearfast of Gatorade, Black Coffee or Tea (Do not add                 anything to coffee or tea).  __X__2.  On the morning of surgery brush your teeth with toothpaste and water, you                may rinse your mouth with mouthwash if you wish.  Do not swallow any toothpaste of mouthwash.     _X__ 3.  No Alcohol for 24 hours before or after surgery.   _X__ 4.  Do Not Smoke or use e-cigarettes For 24 Hours Prior to Your Surgery.                 Do not use any chewable tobacco products for at least 6 hours prior to                 surgery.  ____  5.  Bring all medications with you on the day of surgery if instructed.   __x__  6.  Notify your doctor if there is any change in your medical condition      (cold, fever, infections).     Do not wear jewelry, make-up, hairpins, clips or nail polish. Do not wear lotions, powders, or perfumes. You may wear deodorant. Do not shave 48 hours prior to surgery. Men may shave face and neck. Do not bring valuables to the hospital.    [facility] is not responsible for any belongings or valuables.  Contacts,  dentures or bridgework may not be worn into surgery. Leave your suitcase in the car. After surgery it may be brought to your room. For patients admitted to the hospital, discharge time is determined by your treatment team.   Patients discharged the day of surgery will not be allowed to drive home.   Please read over the following fact sheets that you were given:     __x__ Take these medicines the morning of surgery with A SIP OF WATER:    1. carvedilol (COREG) 3.125 MG tablet  2. hydrALAZINE (APRESOLINE) 100 MG tablet  3. amLODipine (NORVASC) 2.5 MG tablet  4.potassium chloride SA (K-DUR) 20 MEQ tablet  5.ezetimibe (ZETIA) 10 MG tablet  6.traMADol (ULTRAM) 50 MG tablet if needed  ____ Fleet Enema (as directed)   __x__ Use CHG Soap as directed  ____ Use inhalers on the day of surgery  ____ Stop metformin 2 days prior to surgery    ____ Take 1/2 of usual insulin dose the night before surgery. No insulin the morning          of surgery.   __x__ Stop aspirin today  ____ Stop Anti-inflammatories on    ____ Stop supplements until after surgery.    ____ Bring C-Pap to the hospital.

## 2019-05-16 LAB — SARS CORONAVIRUS 2 (TAT 6-24 HRS): SARS Coronavirus 2: NEGATIVE

## 2019-05-17 MED ORDER — CEFAZOLIN SODIUM-DEXTROSE 2-4 GM/100ML-% IV SOLN
2.0000 g | INTRAVENOUS | Status: AC
  Administered 2019-05-18: 2 g via INTRAVENOUS

## 2019-05-18 ENCOUNTER — Ambulatory Visit: Payer: Medicare HMO | Admitting: Certified Registered"

## 2019-05-18 ENCOUNTER — Encounter
Admission: RE | Disposition: A | Discharge status: Home / Self Care | Payer: Self-pay | Source: Ambulatory Visit | Attending: Surgery

## 2019-05-18 ENCOUNTER — Observation Stay
Admission: RE | Admit: 2019-05-18 | Discharge: 2019-05-19 | Disposition: A | Discharge status: Home / Self Care | Payer: Medicare HMO | Source: Ambulatory Visit | Attending: Surgery | Admitting: Surgery

## 2019-05-18 ENCOUNTER — Encounter: Payer: Self-pay | Admitting: *Deleted

## 2019-05-18 ENCOUNTER — Other Ambulatory Visit: Payer: Self-pay

## 2019-05-18 DIAGNOSIS — E785 Hyperlipidemia, unspecified: Secondary | ICD-10-CM | POA: Insufficient documentation

## 2019-05-18 DIAGNOSIS — I10 Essential (primary) hypertension: Secondary | ICD-10-CM | POA: Insufficient documentation

## 2019-05-18 DIAGNOSIS — G4700 Insomnia, unspecified: Secondary | ICD-10-CM | POA: Diagnosis not present

## 2019-05-18 DIAGNOSIS — Z8572 Personal history of non-Hodgkin lymphomas: Secondary | ICD-10-CM | POA: Insufficient documentation

## 2019-05-18 DIAGNOSIS — K420 Umbilical hernia with obstruction, without gangrene: Principal | ICD-10-CM | POA: Diagnosis present

## 2019-05-18 DIAGNOSIS — I2510 Atherosclerotic heart disease of native coronary artery without angina pectoris: Secondary | ICD-10-CM | POA: Insufficient documentation

## 2019-05-18 DIAGNOSIS — K429 Umbilical hernia without obstruction or gangrene: Secondary | ICD-10-CM | POA: Diagnosis not present

## 2019-05-18 DIAGNOSIS — Z87891 Personal history of nicotine dependence: Secondary | ICD-10-CM | POA: Diagnosis not present

## 2019-05-18 DIAGNOSIS — M1990 Unspecified osteoarthritis, unspecified site: Secondary | ICD-10-CM | POA: Insufficient documentation

## 2019-05-18 HISTORY — PX: XI ROBOTIC ASSISTED VENTRAL HERNIA: SHX6789

## 2019-05-18 SURGERY — REPAIR, HERNIA, VENTRAL, ROBOT-ASSISTED
Anesthesia: General | Site: Abdomen

## 2019-05-18 MED ORDER — LIDOCAINE HCL (CARDIAC) PF 100 MG/5ML IV SOSY
PREFILLED_SYRINGE | INTRAVENOUS | Status: DC | PRN
  Administered 2019-05-18: 100 mg via INTRAVENOUS

## 2019-05-18 MED ORDER — ACETAMINOPHEN 500 MG PO TABS
ORAL_TABLET | ORAL | Status: AC
  Administered 2019-05-18: 1000 mg via ORAL
  Filled 2019-05-18: qty 2

## 2019-05-18 MED ORDER — BUPIVACAINE LIPOSOME 1.3 % IJ SUSP
INTRAMUSCULAR | Status: AC
  Filled 2019-05-18: qty 20

## 2019-05-18 MED ORDER — FENTANYL CITRATE (PF) 100 MCG/2ML IJ SOLN
25.0000 ug | INTRAMUSCULAR | Status: AC | PRN
  Administered 2019-05-18 (×6): 25 ug via INTRAVENOUS

## 2019-05-18 MED ORDER — MIDAZOLAM HCL 2 MG/2ML IJ SOLN
INTRAMUSCULAR | Status: AC
  Filled 2019-05-18: qty 2

## 2019-05-18 MED ORDER — SODIUM CHLORIDE FLUSH 0.9 % IV SOLN
INTRAVENOUS | Status: AC
  Filled 2019-05-18: qty 10

## 2019-05-18 MED ORDER — ONDANSETRON HCL 4 MG/2ML IJ SOLN: 4.0000 mg | Freq: Once | INTRAMUSCULAR | Status: DC | PRN

## 2019-05-18 MED ORDER — FENTANYL CITRATE (PF) 100 MCG/2ML IJ SOLN
INTRAMUSCULAR | Status: AC
  Filled 2019-05-18: qty 2

## 2019-05-18 MED ORDER — BUPIVACAINE-EPINEPHRINE (PF) 0.5% -1:200000 IJ SOLN
INTRAMUSCULAR | Status: AC
  Filled 2019-05-18: qty 30

## 2019-05-18 MED ORDER — SUGAMMADEX SODIUM 200 MG/2ML IV SOLN
INTRAVENOUS | Status: DC | PRN
  Administered 2019-05-18: 200 mg via INTRAVENOUS

## 2019-05-18 MED ORDER — CELECOXIB 200 MG PO CAPS
ORAL_CAPSULE | ORAL | Status: AC
  Administered 2019-05-18: 16:00:00 200 mg via ORAL
  Filled 2019-05-18: qty 1

## 2019-05-18 MED ORDER — LACTATED RINGERS IV SOLN
INTRAVENOUS | Status: DC
  Administered 2019-05-18: 16:00:00 via INTRAVENOUS

## 2019-05-18 MED ORDER — FENTANYL CITRATE (PF) 100 MCG/2ML IJ SOLN
INTRAMUSCULAR | Status: DC | PRN
  Administered 2019-05-18: 100 ug via INTRAVENOUS
  Administered 2019-05-18: 50 ug via INTRAVENOUS

## 2019-05-18 MED ORDER — ONDANSETRON HCL 4 MG/2ML IJ SOLN
INTRAMUSCULAR | Status: DC | PRN
  Administered 2019-05-18: 4 mg via INTRAVENOUS

## 2019-05-18 MED ORDER — BUPIVACAINE-EPINEPHRINE 0.5% -1:200000 IJ SOLN
INTRAMUSCULAR | Status: DC | PRN
  Administered 2019-05-18: 30 mL

## 2019-05-18 MED ORDER — SODIUM CHLORIDE (PF) 0.9 % IJ SOLN
INTRAMUSCULAR | Status: AC
  Filled 2019-05-18: qty 50

## 2019-05-18 MED ORDER — PROPOFOL 10 MG/ML IV BOLUS
INTRAVENOUS | Status: DC | PRN
  Administered 2019-05-18: 50 mg via INTRAVENOUS
  Administered 2019-05-18: 150 mg via INTRAVENOUS

## 2019-05-18 MED ORDER — CELECOXIB 200 MG PO CAPS
200.0000 mg | ORAL_CAPSULE | ORAL | Status: AC
  Administered 2019-05-18: 16:00:00 200 mg via ORAL

## 2019-05-18 MED ORDER — CEFAZOLIN SODIUM-DEXTROSE 2-4 GM/100ML-% IV SOLN
INTRAVENOUS | Status: AC
  Filled 2019-05-18: qty 100

## 2019-05-18 MED ORDER — FENTANYL CITRATE (PF) 100 MCG/2ML IJ SOLN
INTRAMUSCULAR | Status: AC
  Administered 2019-05-18: 25 ug via INTRAVENOUS
  Filled 2019-05-18: qty 2

## 2019-05-18 MED ORDER — PROPOFOL 10 MG/ML IV BOLUS
INTRAVENOUS | Status: AC
  Filled 2019-05-18: qty 20

## 2019-05-18 MED ORDER — FAMOTIDINE 20 MG PO TABS
20.0000 mg | ORAL_TABLET | Freq: Once | ORAL | Status: AC
  Administered 2019-05-18: 16:00:00 20 mg via ORAL

## 2019-05-18 MED ORDER — DEXAMETHASONE SODIUM PHOSPHATE 10 MG/ML IJ SOLN
INTRAMUSCULAR | Status: DC | PRN
  Administered 2019-05-18: 5 mg via INTRAVENOUS

## 2019-05-18 MED ORDER — FAMOTIDINE 20 MG PO TABS
ORAL_TABLET | ORAL | Status: AC
  Administered 2019-05-18: 20 mg via ORAL
  Filled 2019-05-18: qty 1

## 2019-05-18 MED ORDER — ROCURONIUM BROMIDE 100 MG/10ML IV SOLN
INTRAVENOUS | Status: DC | PRN
  Administered 2019-05-18: 50 mg via INTRAVENOUS
  Administered 2019-05-18: 20 mg via INTRAVENOUS

## 2019-05-18 MED ORDER — CHLORHEXIDINE GLUCONATE CLOTH 2 % EX PADS: 6.0000 | MEDICATED_PAD | Freq: Once | CUTANEOUS | Status: DC

## 2019-05-18 MED ORDER — SODIUM CHLORIDE 0.9 % IV SOLN
INTRAVENOUS | Status: DC | PRN
  Administered 2019-05-18: 70 mL

## 2019-05-18 MED ORDER — ACETAMINOPHEN 500 MG PO TABS
1000.0000 mg | ORAL_TABLET | ORAL | Status: AC
  Administered 2019-05-18: 16:00:00 1000 mg via ORAL

## 2019-05-18 SURGICAL SUPPLY — 60 items
BLADE SURG SZ11 CARB STEEL (BLADE) ×2 IMPLANT
CANISTER SUCT 1200ML W/VALVE (MISCELLANEOUS) ×2 IMPLANT
CANNULA REDUC XI 12-8 STAPL (CANNULA) ×1
CANNULA REDUCER 12-8 DVNC XI (CANNULA) ×1 IMPLANT
CHLORAPREP W/TINT 26 (MISCELLANEOUS) ×2 IMPLANT
COVER TIP SHEARS 8 DVNC (MISCELLANEOUS) ×1 IMPLANT
COVER TIP SHEARS 8MM DA VINCI (MISCELLANEOUS) ×1
COVER WAND RF STERILE (DRAPES) ×2 IMPLANT
DEFOGGER SCOPE WARMER CLEARIFY (MISCELLANEOUS) IMPLANT
DERMABOND ADVANCED (GAUZE/BANDAGES/DRESSINGS)
DERMABOND ADVANCED .7 DNX12 (GAUZE/BANDAGES/DRESSINGS) IMPLANT
DRAPE 3/4 80X56 (DRAPES) IMPLANT
DRAPE ARM DVNC X/XI (DISPOSABLE) ×3 IMPLANT
DRAPE COLUMN DVNC XI (DISPOSABLE) ×1 IMPLANT
DRAPE DA VINCI XI ARM (DISPOSABLE) ×3
DRAPE DA VINCI XI COLUMN (DISPOSABLE) ×1
ELECT CAUTERY BLADE 6.4 (BLADE) IMPLANT
ELECT REM PT RETURN 9FT ADLT (ELECTROSURGICAL) ×2
ELECTRODE REM PT RTRN 9FT ADLT (ELECTROSURGICAL) ×1 IMPLANT
ETHIBOND 2 0 GREEN CT 2 30IN (SUTURE) IMPLANT
GLOVE BIOGEL PI IND STRL 7.0 (GLOVE) ×2 IMPLANT
GLOVE BIOGEL PI INDICATOR 7.0 (GLOVE) ×2
GLOVE SURG SYN 6.5 ES PF (GLOVE) ×4 IMPLANT
GOWN STRL REUS W/ TWL LRG LVL3 (GOWN DISPOSABLE) ×3 IMPLANT
GOWN STRL REUS W/TWL LRG LVL3 (GOWN DISPOSABLE) ×3
GRASPER SUT TROCAR 14GX15 (MISCELLANEOUS) ×2 IMPLANT
IRRIGATOR SUCT 8 DISP DVNC XI (IRRIGATION / IRRIGATOR) IMPLANT
IRRIGATOR SUCTION 8MM XI DISP (IRRIGATION / IRRIGATOR)
IV NS 1000ML (IV SOLUTION)
IV NS 1000ML BAXH (IV SOLUTION) IMPLANT
KIT PINK PAD W/HEAD ARE REST (MISCELLANEOUS) ×2
KIT PINK PAD W/HEAD ARM REST (MISCELLANEOUS) ×1 IMPLANT
LABEL OR SOLS (LABEL) IMPLANT
MESH VENTRALIGHT ST 6IN CRC (Mesh General) ×2 IMPLANT
NEEDLE HYPO 22GX1.5 SAFETY (NEEDLE) ×2 IMPLANT
NEEDLE VERESS 14GA 120MM (NEEDLE) ×2 IMPLANT
OBTURATOR OPTICAL STANDARD 8MM (TROCAR) ×1
OBTURATOR OPTICAL STND 8 DVNC (TROCAR) ×1
OBTURATOR OPTICALSTD 8 DVNC (TROCAR) ×1 IMPLANT
PACK LAP CHOLECYSTECTOMY (MISCELLANEOUS) ×2 IMPLANT
PENCIL ELECTRO HAND CTR (MISCELLANEOUS) ×2 IMPLANT
SEAL CANN UNIV 5-8 DVNC XI (MISCELLANEOUS) ×2 IMPLANT
SEAL XI 5MM-8MM UNIVERSAL (MISCELLANEOUS) ×2
SOLUTION ELECTROLUBE (MISCELLANEOUS) ×2 IMPLANT
STAPLER CANNULA SEAL DVNC XI (STAPLE) ×1 IMPLANT
STAPLER CANNULA SEAL XI (STAPLE) ×1
SUT DVC VLOC 180 2-0 12IN GS21 (SUTURE) ×4
SUT DVC VLOC 3-0 CL 6 P-12 (SUTURE) IMPLANT
SUT MNCRL AB 4-0 PS2 18 (SUTURE) ×2 IMPLANT
SUT STRATAFIX 0 PDS+ CT-2 23 (SUTURE) ×2
SUT VIC AB 0 CT1 36 (SUTURE) ×2 IMPLANT
SUT VIC AB 3-0 SH 27 (SUTURE) ×1
SUT VIC AB 3-0 SH 27X BRD (SUTURE) ×1 IMPLANT
SUT VICRYL 0 AB UR-6 (SUTURE) ×2 IMPLANT
SUTURE DVC VL 180 2-0 12INGS21 (SUTURE) ×2 IMPLANT
SUTURE STRATFX 0 PDS+ CT-2 23 (SUTURE) ×1 IMPLANT
SYR 30ML LL (SYRINGE) ×2 IMPLANT
TRAY FOLEY MTR SLVR 16FR STAT (SET/KITS/TRAYS/PACK) IMPLANT
TROCAR XCEL NON-BLD 5MMX100MML (ENDOMECHANICALS) ×2 IMPLANT
TUBING EVAC SMOKE HEATED PNEUM (TUBING) ×2 IMPLANT

## 2019-05-18 NOTE — Interval H&P Note (Signed)
History and Physical Interval Note:  05/18/2019 7:25 PM  John Gallegos  has presented today for surgery, with the diagnosis of [id number] Umbilical hernia w/o obstruction or gangrene.  The various methods of treatment have been discussed with the patient and family. After consideration of risks, benefits and other options for treatment, the patient has consented to  Procedure(s): XI ROBOTIC ASSISTED UMBILICAL HERNIA (N/A) as a surgical intervention.  The patient's history has been reviewed, patient examined, no change in status, stable for surgery.  I have reviewed the patient's chart and labs.  Questions were answered to the patient's satisfaction.     [name] [name]

## 2019-05-18 NOTE — Anesthesia Procedure Notes (Signed)
Procedure Name: Intubation Date/Time: 05/18/2019 7:49 PM Performed by: Chanetta Marshall, CRNA Pre-anesthesia Checklist: Patient identified, Emergency Drugs available, Suction available and Patient being monitored Patient Re-evaluated:Patient Re-evaluated prior to induction Oxygen Delivery Method: Circle system utilized Preoxygenation: Pre-oxygenation with 100% oxygen Induction Type: IV induction Ventilation: Mask ventilation without difficulty Laryngoscope Size: McGraph and 3 Grade View: Grade I Tube type: Oral Number of attempts: 1 Airway Equipment and Method: Stylet,  Oral airway and Video-laryngoscopy Placement Confirmation: ETT inserted through vocal cords under direct vision,  positive ETCO2,  breath sounds checked- equal and bilateral and CO2 detector Secured at: 24 cm Tube secured with: Tape Dental Injury: Teeth and Oropharynx as per pre-operative assessment

## 2019-05-18 NOTE — Anesthesia Preprocedure Evaluation (Signed)
Anesthesia Evaluation  Patient identified by MRN, date of birth, ID band Patient awake    Reviewed: Allergy & Precautions, NPO status , Patient's Chart, lab work & pertinent test results  History of Anesthesia Complications Negative for: history of anesthetic complications  Airway Mallampati: II  TM Distance: >3 FB Neck ROM: Full    Dental  (+) Poor Dentition, Chipped   Pulmonary neg sleep apnea, neg COPD, Current Smoker, former smoker,    breath sounds clear to auscultation- rhonchi (-) wheezing      Cardiovascular Exercise Tolerance: Good hypertension, Pt. 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, chemo tx's and in remission in 2019 No date: Urinary incontinence     Comment:  frequency   Reproductive/Obstetrics                             Anesthesia Physical  Anesthesia Plan  ASA: III  Anesthesia Plan: General   Post-op Pain Management:    Induction: Intravenous  PONV Risk Score and Plan: 2 and Ondansetron and Dexamethasone  Airway Management Planned: Oral ETT  Additional Equipment:   Intra-op Plan:   Post-operative Plan: Extubation in OR  Informed Consent: I have reviewed the patients History and Physical, chart, labs and discussed the procedure including the risks,  benefits and alternatives for the proposed anesthesia with the patient or authorized representative who has indicated his/her understanding and acceptance.     Dental advisory given  Plan Discussed with: CRNA and Anesthesiologist  Anesthesia Plan Comments:         Anesthesia Quick Evaluation

## 2019-05-18 NOTE — Anesthesia Post-op Follow-up Note (Signed)
Anesthesia QCDR form completed.        

## 2019-05-18 NOTE — Transfer of Care (Signed)
Immediate Anesthesia Transfer of Care Note  Patient: John Gallegos  Procedure(s) Performed: XI ROBOTIC ASSISTED UMBILICAL HERNIA (N/A Abdomen)  Patient Location: PACU  Anesthesia Type:General  Level of Consciousness: awake, alert  and oriented  Airway & Oxygen Therapy: Patient Spontanous Breathing and Patient connected to nasal cannula oxygen  Post-op Assessment: Report given to RN and Post -op Vital signs reviewed and stable  Post vital signs: Reviewed and stable  Last Vitals:  Vitals Value Taken Time  BP    Temp    Pulse    Resp    SpO2      Last Pain:  Vitals:   05/18/19 1610  PainSc: 0-No pain         Complications: No apparent anesthesia complications

## 2019-05-19 ENCOUNTER — Other Ambulatory Visit: Payer: Self-pay

## 2019-05-19 DIAGNOSIS — G4700 Insomnia, unspecified: Secondary | ICD-10-CM | POA: Diagnosis not present

## 2019-05-19 DIAGNOSIS — K420 Umbilical hernia with obstruction, without gangrene: Secondary | ICD-10-CM | POA: Diagnosis not present

## 2019-05-19 DIAGNOSIS — Z87891 Personal history of nicotine dependence: Secondary | ICD-10-CM | POA: Diagnosis not present

## 2019-05-19 DIAGNOSIS — E785 Hyperlipidemia, unspecified: Secondary | ICD-10-CM | POA: Diagnosis not present

## 2019-05-19 DIAGNOSIS — M1990 Unspecified osteoarthritis, unspecified site: Secondary | ICD-10-CM | POA: Diagnosis not present

## 2019-05-19 DIAGNOSIS — I2510 Atherosclerotic heart disease of native coronary artery without angina pectoris: Secondary | ICD-10-CM | POA: Diagnosis not present

## 2019-05-19 DIAGNOSIS — Z8572 Personal history of non-Hodgkin lymphomas: Secondary | ICD-10-CM | POA: Diagnosis not present

## 2019-05-19 DIAGNOSIS — I10 Essential (primary) hypertension: Secondary | ICD-10-CM | POA: Diagnosis not present

## 2019-05-19 MED ORDER — TRAMADOL HCL 50 MG PO TABS: 50.0000 mg | ORAL_TABLET | Freq: Four times a day (QID) | ORAL | Status: DC | PRN

## 2019-05-19 MED ORDER — GABAPENTIN 300 MG PO CAPS: 300.0000 mg | ORAL_CAPSULE | Freq: Every day | ORAL | Status: DC

## 2019-05-19 MED ORDER — ACETAMINOPHEN 325 MG PO TABS: 650.0000 mg | ORAL_TABLET | Freq: Three times a day (TID) | ORAL | 0 refills | Status: AC | PRN

## 2019-05-19 MED ORDER — HYDROCODONE-ACETAMINOPHEN 5-325 MG PO TABS
1.0000 | ORAL_TABLET | ORAL | Status: DC | PRN
  Administered 2019-05-19 (×2): 2 via ORAL
  Filled 2019-05-19 (×2): qty 2

## 2019-05-19 MED ORDER — IBUPROFEN 400 MG PO TABS: 600.0000 mg | ORAL_TABLET | Freq: Four times a day (QID) | ORAL | Status: DC | PRN

## 2019-05-19 MED ORDER — EZETIMIBE 10 MG PO TABS
10.0000 mg | ORAL_TABLET | Freq: Every day | ORAL | Status: DC
  Filled 2019-05-19: qty 1

## 2019-05-19 MED ORDER — ASPIRIN EC 81 MG PO TBEC: 81.0000 mg | DELAYED_RELEASE_TABLET | Freq: Every day | ORAL | Status: DC

## 2019-05-19 MED ORDER — ZOLPIDEM TARTRATE 5 MG PO TABS
5.0000 mg | ORAL_TABLET | Freq: Every day | ORAL | Status: DC
  Administered 2019-05-19: 5 mg via ORAL
  Filled 2019-05-19: qty 1

## 2019-05-19 MED ORDER — LOSARTAN POTASSIUM-HCTZ 100-25 MG PO TABS: 1.0000 | ORAL_TABLET | Freq: Every day | ORAL | Status: DC

## 2019-05-19 MED ORDER — IBUPROFEN 800 MG PO TABS: 800.0000 mg | ORAL_TABLET | Freq: Three times a day (TID) | ORAL | 0 refills | Status: DC | PRN

## 2019-05-19 MED ORDER — ACETAMINOPHEN 325 MG PO TABS: 650.0000 mg | ORAL_TABLET | Freq: Four times a day (QID) | ORAL | Status: DC | PRN

## 2019-05-19 MED ORDER — CARVEDILOL 6.25 MG PO TABS: 3.1250 mg | ORAL_TABLET | Freq: Two times a day (BID) | ORAL | Status: DC

## 2019-05-19 MED ORDER — DOCUSATE SODIUM 100 MG PO CAPS: 100.0000 mg | ORAL_CAPSULE | Freq: Two times a day (BID) | ORAL | 0 refills | Status: AC | PRN

## 2019-05-19 MED ORDER — HYDRALAZINE HCL 50 MG PO TABS
100.0000 mg | ORAL_TABLET | Freq: Two times a day (BID) | ORAL | Status: DC
  Administered 2019-05-19: 100 mg via ORAL
  Filled 2019-05-19: qty 2

## 2019-05-19 MED ORDER — LOSARTAN POTASSIUM 50 MG PO TABS: 100.0000 mg | ORAL_TABLET | Freq: Every day | ORAL | Status: DC

## 2019-05-19 MED ORDER — HYDROCHLOROTHIAZIDE 25 MG PO TABS: 25.0000 mg | ORAL_TABLET | Freq: Every day | ORAL | Status: DC

## 2019-05-19 MED ORDER — AMLODIPINE BESYLATE 5 MG PO TABS: 2.5000 mg | ORAL_TABLET | Freq: Every day | ORAL | Status: DC

## 2019-05-19 NOTE — Discharge Instructions (Signed)
Hernia repair, Care After This sheet gives you information about how to care for yourself after your procedure. Your health care provider may also give you more specific instructions. If you have problems or questions, contact your health care provider. What can I expect after the procedure? After your procedure, it is common to have the following:  Pain in your abdomen, especially in the incision areas. You will be given medicine to control the pain.  Tiredness. This is a normal part of the recovery process. Your energy level will return to normal over the next several weeks.  Changes in your bowel movements, such as constipation or needing to go more often. Talk with your health care provider about how to manage this. Follow these instructions at home: Medicines   tylenol and advil as needed for discomfort.  Please alternate between the two every four hours as needed for pain.     Use narcotics, if prescribed, only when tylenol and motrin is not enough to control pain.   325-650mg  every 8hrs to max of 3000mg /24hrs for the tylenol.     Advil up to 800mg  per dose every 8hrs as needed for pain.    PLEASE RECORD NUMBER OF PILLS TAKEN UNTIL NEXT FOLLOW UP APPT.  THIS WILL HELP DETERMINE HOW READY YOU ARE TO BE RELEASED FROM ANY ACTIVITY RESTRICTIONS  Do not drive or use heavy machinery while taking prescription pain medicine.  Do not drink alcohol while taking prescription pain medicine.  Incision care     Follow instructions from your health care provider about how to take care of your incision areas. Make sure you: ? Keep your incisions clean and dry. ? Wash your hands with soap and water before and after applying medicine to the areas, and before and after changing your bandage (dressing). If soap and water are not available, use hand sanitizer. ? Change your dressing as told by your health care provider. ? Leave stitches (sutures), skin glue, or adhesive strips in place. These  skin closures may need to stay in place for 2 weeks or longer. If adhesive strip edges start to loosen and curl up, you may trim the loose edges. Do not remove adhesive strips completely unless your health care provider tells you to do that.  Do not wear tight clothing over the incisions. Tight clothing may rub and irritate the incision areas, which may cause the incisions to open.  Do not take baths, swim, or use a hot tub until your health care provider approves. OK TO SHOWER IN 24HRS.    Check your incision area every day for signs of infection. Check for: ? More redness, swelling, or pain. ? More fluid or blood. ? Warmth. ? Pus or a bad smell. Activity  Avoid lifting anything that is heavier than 10 lb (4.5 kg) for 2 weeks or until your health care provider says it is okay.  No pushing/pulling greater than 30lbs  You may resume normal activities as told by your health care provider. Ask your health care provider what activities are safe for you.  Take rest breaks during the day as needed. Eating and drinking  Follow instructions from your health care provider about what you can eat after surgery.  To prevent or treat constipation while you are taking prescription pain medicine, your health care provider may recommend that you: ? Drink enough fluid to keep your urine clear or pale yellow. ? Take over-the-counter or prescription medicines. ? Eat foods that are high in fiber,  such as fresh fruits and vegetables, whole grains, and beans. ? Limit foods that are high in fat and processed sugars, such as fried and sweet foods. General instructions  Ask your health care provider when you will need an appointment to get your sutures or staples removed.  Keep all follow-up visits as told by your health care provider. This is important. Contact a health care provider if:  You have more redness, swelling, or pain around your incisions.  You have more fluid or blood coming from the  incisions.  Your incisions feel warm to the touch.  You have pus or a bad smell coming from your incisions or your dressing.  You have a fever.  You have an incision that breaks open (edges not staying together) after sutures or staples have been removed. Get help right away if:  You develop a rash.  You have chest pain or difficulty breathing.  You have pain or swelling in your legs.  You feel light-headed or you faint.  Your abdomen swells (becomes distended).  You have nausea or vomiting.  You have blood in your stool (feces). This information is not intended to replace advice given to you by your health care provider. Make sure you discuss any questions you have with your health care provider. Document Released: 02/12/2005 Document Revised: 04/14/2018 Document Reviewed: 04/26/2016 Elsevier Interactive Patient Education  2019 [employer].

## 2019-05-21 ENCOUNTER — Other Ambulatory Visit: Payer: Self-pay

## 2019-05-21 ENCOUNTER — Encounter: Payer: Self-pay | Admitting: Surgery

## 2019-05-21 NOTE — Progress Notes (Signed)
Patient prescreened for appointments. He states he had Hernia repair on Friday 10/9 and was unable to sleep due to discomfort. No other concerns voiced.

## 2019-05-21 NOTE — Anesthesia Postprocedure Evaluation (Signed)
Anesthesia Post Note  Patient: John Gallegos  Procedure(s) Performed: XI ROBOTIC ASSISTED UMBILICAL HERNIA (N/A Abdomen)  Patient location during evaluation: PACU Anesthesia Type: General Level of consciousness: awake and alert and oriented Pain management: pain level controlled Vital Signs Assessment: post-procedure vital signs reviewed and stable Respiratory status: spontaneous breathing Cardiovascular status: blood pressure returned to baseline Anesthetic complications: no     Last Vitals:  Vitals:   05/19/19 0048 05/19/19 0540  BP: (!) 150/87 130/75  Pulse: (!) 54 (!) 53  Resp:  18  Temp: 36.5 C 36.6 C  SpO2: 95% 95%    Last Pain:  Vitals:   05/19/19 0625  TempSrc:   PainSc: 5                  Carlotta Telfair

## 2019-05-22 ENCOUNTER — Encounter: Payer: Self-pay | Admitting: Oncology

## 2019-05-22 ENCOUNTER — Inpatient Hospital Stay: Payer: Medicare HMO | Admitting: *Deleted

## 2019-05-22 ENCOUNTER — Other Ambulatory Visit: Payer: Self-pay

## 2019-05-22 ENCOUNTER — Inpatient Hospital Stay: Payer: Medicare HMO | Attending: Oncology | Admitting: Oncology

## 2019-05-22 VITALS — BP 150/83 | HR 53 | Temp 96.5°F | Resp 20 | Wt 235.1 lb

## 2019-05-22 DIAGNOSIS — Z7982 Long term (current) use of aspirin: Secondary | ICD-10-CM | POA: Insufficient documentation

## 2019-05-22 DIAGNOSIS — C8339 Diffuse large B-cell lymphoma, extranodal and solid organ sites: Secondary | ICD-10-CM | POA: Diagnosis not present

## 2019-05-22 DIAGNOSIS — I10 Essential (primary) hypertension: Secondary | ICD-10-CM | POA: Insufficient documentation

## 2019-05-22 DIAGNOSIS — M4306 Spondylolysis, lumbar region: Secondary | ICD-10-CM | POA: Insufficient documentation

## 2019-05-22 DIAGNOSIS — M109 Gout, unspecified: Secondary | ICD-10-CM | POA: Diagnosis not present

## 2019-05-22 DIAGNOSIS — Z95828 Presence of other vascular implants and grafts: Secondary | ICD-10-CM | POA: Diagnosis not present

## 2019-05-22 DIAGNOSIS — Z87891 Personal history of nicotine dependence: Secondary | ICD-10-CM | POA: Diagnosis not present

## 2019-05-22 DIAGNOSIS — Z9221 Personal history of antineoplastic chemotherapy: Secondary | ICD-10-CM | POA: Insufficient documentation

## 2019-05-22 DIAGNOSIS — G629 Polyneuropathy, unspecified: Secondary | ICD-10-CM | POA: Insufficient documentation

## 2019-05-22 DIAGNOSIS — E876 Hypokalemia: Secondary | ICD-10-CM | POA: Diagnosis not present

## 2019-05-22 DIAGNOSIS — K449 Diaphragmatic hernia without obstruction or gangrene: Secondary | ICD-10-CM | POA: Insufficient documentation

## 2019-05-22 DIAGNOSIS — Z79899 Other long term (current) drug therapy: Secondary | ICD-10-CM | POA: Diagnosis not present

## 2019-05-22 DIAGNOSIS — G8929 Other chronic pain: Secondary | ICD-10-CM | POA: Insufficient documentation

## 2019-05-22 LAB — COMPREHENSIVE METABOLIC PANEL
ALT: 13 U/L (ref 0–44)
AST: 17 U/L (ref 15–41)
Albumin: 3.8 g/dL (ref 3.5–5.0)
Alkaline Phosphatase: 82 U/L (ref 38–126)
Anion gap: 8 (ref 5–15)
BUN: 18 mg/dL (ref 8–23)
CO2: 27 mmol/L (ref 22–32)
Calcium: 9.3 mg/dL (ref 8.9–10.3)
Chloride: 106 mmol/L (ref 98–111)
Creatinine, Ser: 1.11 mg/dL (ref 0.61–1.24)
GFR calc Af Amer: >60 mL/min (ref >60)
GFR calc non Af Amer: >60 mL/min (ref >60)
Glucose, Bld: 112 mg/dL — ABNORMAL HIGH (ref 70–99)
Potassium: 3.2 mmol/L — ABNORMAL LOW (ref 3.5–5.1)
Sodium: 141 mmol/L (ref 135–145)
Total Bilirubin: 0.7 mg/dL (ref 0.3–1.2)
Total Protein: 6.9 g/dL (ref 6.5–8.1)

## 2019-05-22 LAB — CBC WITH DIFFERENTIAL/PLATELET
Abs Immature Granulocytes: 0.02 10*3/uL (ref 0.00–0.07)
Basophils Absolute: 0 10*3/uL (ref 0.0–0.1)
Basophils Relative: 1 %
Eosinophils Absolute: 0.2 10*3/uL (ref 0.0–0.5)
Eosinophils Relative: 3 %
HCT: 37.7 % — ABNORMAL LOW (ref 39.0–52.0)
Hemoglobin: 12.9 g/dL — ABNORMAL LOW (ref 13.0–17.0)
Immature Granulocytes: 0 %
Lymphocytes Relative: 13 %
Lymphs Abs: 0.7 10*3/uL (ref 0.7–4.0)
MCH: 31 pg (ref 26.0–34.0)
MCHC: 34.2 g/dL (ref 30.0–36.0)
MCV: 90.6 fL (ref 80.0–100.0)
Monocytes Absolute: 0.6 10*3/uL (ref 0.1–1.0)
Monocytes Relative: 10 %
Neutro Abs: 4 10*3/uL (ref 1.7–7.7)
Neutrophils Relative %: 73 %
Platelets: 178 10*3/uL (ref 150–400)
RBC: 4.16 MIL/uL — ABNORMAL LOW (ref 4.22–5.81)
RDW: 13.1 % (ref 11.5–15.5)
WBC: 5.5 10*3/uL (ref 4.0–10.5)
nRBC: 0 % (ref 0.0–0.2)

## 2019-05-22 LAB — LACTATE DEHYDROGENASE: LDH: 131 U/L (ref 98–192)

## 2019-05-22 MED ORDER — SODIUM CHLORIDE 0.9% FLUSH
10.0000 mL | Freq: Once | INTRAVENOUS | Status: AC
  Administered 2019-05-22: 10 mL via INTRAVENOUS
  Filled 2019-05-22: qty 10

## 2019-05-22 MED ORDER — HEPARIN SOD (PORK) LOCK FLUSH 100 UNIT/ML IV SOLN
500.0000 [IU] | Freq: Once | INTRAVENOUS | Status: AC
  Administered 2019-05-22: 500 [IU] via INTRAVENOUS

## 2019-05-22 NOTE — Progress Notes (Signed)
Hematology/Oncology follow-up note [name] [phone]   Patient Care Team: Tracie Harrier, MD as PCP - General (Internal Medicine) Earlie Server, MD as Consulting Physician (Oncology) Lucky Cowboy Erskine Squibb, MD as Referring Physician (Vascular Surgery) Abbie Sons, MD (Urology)  REFERRING PROVIDER: Tracie Harrier, MD  REASON FOR VISIT Follow up for surveillance for diffuse large B-cell lymphoma.  HISTORY OF PRESENTING ILLNESS:  John Gallegos is a  73 y.o.  male with PMH listed below presented for follow-up for treatment of diffuse large B cell lymphoma.  Pertinent history of oncology workup includes: Patient was found to have microscopic hematuria on 02/23/2017 with 4-10 RBC's/hpf.    Urine culture was negative.having symptoms of nocturia He is smoker. 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 He was in the [employer] and reports to be exposed to [place] in [place]. CT scan 04/20/2017 which revealed moderate left hydronephrosis and delayed excretion down to a large 4.4 cm mass in or around the left proximal ureter. There was also hazy soft tissue in the fat surrounding the celiac axis and superior mesenteric artery. Thickening of the right diaphragmatic crus. High gastrohepatic ligament lymphadenopathy measures up to 11 mm. Peripancreatic and root of small bowel mesentery adenopathy measures up to 2.6 cm.  He was seen by urologist Dr. Pilar Jarvis and he underwent cystoscopy, left retrograde pyelogram, left ureteroscopy and placement of left ureteral stent placement on 05/13/2017. Finding during the procedure that he has extrinsic compression of the ureter, possibly from lymphoma.  # Pathology:  Biopsy of paraspinal musculature at L2. Pathology results reviewed large B cell lymphoma. It is CD20 positive, CD10 positive. Flow cytometry shows a small clonal population of large B cells positive for CD10 and absent cop and lambda light  chains. The abnormal cells represent 2% of the total cells.The morphology, initial IHC panel, and flow cytometry supports classification as B-cell lymphoma, CD10 positive, with large cell features.diffuse large B-cell lymphoma, germinal center B-cell type. Discussed with pathologist Dr.Onley, Ki67 50%, MUM1 negative, MYC negative, Cycline D1 negative, CD30 negative, FISH is positive for BCL2 gene rearrangement. Negative for MYC and BCL6. 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   # Bone marrow Biopsy: negative for lymphoma involvement.  # PET scan 05/18/2017  IMPRESSION: 1. Multifocal metastatic disease within the LEFT retroperitoneum, central mesenteries, and RIGHT paraspinal musculature. 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 Recommend percutaneous biopsy of the RIGHT paraspinal musculature at L2. 2. 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 # Interim PET scan 07/28/2017 after 2 cycles of RCHOP 1. Marked improvement, with row solution of the prior bony lesions and significant reduction in activity of the central mesenteric and right gastric adenopathy. Central mesenteric nodal is currently Deauville 3, previously Deauville 4 and Deauville 5. 2. Improvement in the right paraspinal muscular activity, currently Deauville 3, previously Deauville 4. I also discussed with radiologist that there has been further improvement in the left periureteral density, likely reflecting treatment response.   # Prognostic Model for the risk of CNS lymphoma involvement per NCCN guideline,  Patient is intermediate risk with score of 2, one from being >60, and one from more than extranodal involvement>1 sites (bone, paraspinal muscle involvement). CNS prophylaxis is usually recommended if score 4-6.  CNS prophylaxis was not offered.    #08/04/2018 CT abdomen pelvis showed no evidence of acute abnormality.  Stable stranding haziness at the root of  mesentery  and retroperitoneum.  No new or enlarged lymph nodes identified.  Small to moderate umbilical hernia containing fat, moderate right lower lobe atelectasis and a small loculated right pleural effusion again noted.  Cholelithiasis without CT evidence of acute cholecystitis.  Prostate enlargement.  Aortic atherosclerosis.    # Treatment:  06/21/2017 Cycle 1 RCHOP [name]. Dexamethasone 39m on Day 1,  Prednisone 80 mg daily Day 2-5. 07/12/2017 Cycle 2 RCHOP /[name].Dexamethasone 233mon Day 1, Prednisone 80 mg daily Day 2-5 08/03/2017 Cycle 3 RCHOP/onpro.Dexamethasone 2047mn Day 1, Prednisone 80 mg daily Day 2-5  08/24/2017 Cycle 4 RCHOP/onpro.Dexamethasone 26m69m Day 1, Prednisone 80 mg daily Day 2-5  09/14/2017 Cycle 5 RCHOP/onpro.Dexamethasone 26mg24mDay 1, Prednisone 80 mg daily Day 2-5  10/05/2017 Cycle 6 RCHOP/onpro.Dexamethasone 26mg 8may 1, Prednisone 80 mg daily Day 2-5  INTERVAL HISTORY 73 y.o57male with oncology history listed as above reviewed by me today presents for follow-up for diffuse large B-cell lymphoma. #Most recently patient had hernia repair meant by Dr. Sakai.Lysle Pearleports still some soreness at the surgical site.  Otherwise doing well. He has good Appetite Denies weight loss, fever, chills, fatigue, night sweats.  Chronic back pain, intermittent, not worse previous MRI of the lumbar showed diffuse lumbar spine spondylolysis.  No aggressive osseous lesion involving the lumbar spine. Some residual neuropathy, stable. Review of Systems  Constitutional: Positive for fatigue. Negative for appetite change, chills, fever and unexpected weight change.  HENT:   Negative for hearing loss and voice change.   Eyes: Negative for eye problems and icterus.  Respiratory: Negative for chest tightness, cough and shortness of breath.   Cardiovascular: Negative for chest pain and leg swelling.  Gastrointestinal: Negative for abdominal distention and abdominal pain.  Endocrine:  Negative for hot flashes.  Genitourinary: Negative for difficulty urinating, dysuria and frequency.   Musculoskeletal: Negative for arthralgias.  Skin: Negative for itching and rash.       Recent hernia repair  Neurological: Negative for light-headedness and numbness.  Hematological: Negative for adenopathy. Does not bruise/bleed easily.  Psychiatric/Behavioral: Negative for confusion.     MEDICAL HISTORY:  Past Medical History:  Diagnosis Date  . Gout   . Hard of hearing   . Hiatal hernia   . Hypertension   . Non Hodgkin's lymphoma (HCC) [id number]   B-cell, chemo tx's and in remission in 2019  . Urinary incontinence    frequency    SURGICAL HISTORY: Past Surgical History:  Procedure Laterality Date  . CYSTOSCOPY W/ RETROGRADES Left 08/19/2017   Procedure: CYSTOSCOPY WITH RETROGRADE PYELOGRAM;  Surgeon: StoiofAbbie Sons Location: [facility];  Service: Urology;  Laterality: Left;  . CYSTOSCOPY W/ URETERAL STENT REMOVAL Left 08/19/2017   Procedure: CYSTOSCOPY WITH STENT REMOVAL;  Surgeon: StoiofAbbie Sons Location: [facility];  Service: Urology;  Laterality: Left;  . CYSTOSCOPY WITH BIOPSY Left 05/13/2017   Procedure: CYSTOSCOPY WITH URETERAL BIOPSY;  Surgeon: BudzynNickie Retort Location: [facility];  Service: Urology;  Laterality: Left;  . CYSTOSCOPY WITH STENT PLACEMENT Left 05/13/2017   Procedure: CYSTOSCOPY WITH STENT PLACEMENT;  Surgeon: BudzynNickie Retort Location: [facility];  Service: Urology;  Laterality: Left;  . PORTA CATH INSERTION N/A 06/14/2017   Procedure: PORTA CATH INSERTION;  Surgeon: Dew, JAlgernon Huxley [facility];  Service: Cardiovascular;  Laterality: N/A;  . 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 right knne surgery  needle went through knee  . TONSILLECTOMY    . URETEROSCOPY Left 05/13/2017   Procedure: URETEROSCOPY;  Surgeon: Nickie Retort, MD;  Location: [facility];  Service: Urology;  Laterality: Left;  . XI ROBOTIC  ASSISTED VENTRAL HERNIA N/A 05/18/2019   Procedure: XI ROBOTIC ASSISTED UMBILICAL HERNIA;  Surgeon: Benjamine Sprague, DO;  Location: [facility];  Service: General;  Laterality: N/A;    SOCIAL HISTORY: Social History   Socioeconomic History  . Marital status: Married    Spouse name: Not on file  . Number of children: Not on file  . Years of education: Not on file  . Highest education level: Not on file  Occupational History  . Not on file  Social Needs  . Financial resource strain: Not on file  . Food insecurity    Worry: Not on file    Inability: Not on file  . Transportation needs    Medical: Not on file    Non-medical: Not on file  Tobacco Use  . Smoking status: Former Smoker    Packs/day: 0.50    Years: 50.00    Pack years: 25.00    Types: Cigarettes    Quit date: 12/12/2016    Years since quitting: 2.4  . Smokeless tobacco: Never Used  Substance and Sexual Activity  . Alcohol use: Yes    Alcohol/week: 3.0 standard drinks    Types: 3 Shots of liquor per week    Comment: occ. beer  . Drug use: No  . Sexual activity: Not on file  Lifestyle  . Physical activity    Days per week: Not on file    Minutes per session: Not on file  . Stress: Not on file  Relationships  . Social Herbalist on phone: Not on file    Gets together: Not on file    Attends religious service: Not on file    Active member of club or organization: Not on file    Attends meetings of clubs or organizations: Not on file    Relationship status: Not on file  . Intimate partner violence    Fear of current or ex partner: Not on file    Emotionally abused: Not on file    Physically abused: Not on file    Forced sexual activity: Not on file  Other Topics Concern  . Not on file  Social History Narrative  . Not on file    FAMILY HISTORY: Family History  Problem Relation Age of Onset  . Pancreatic cancer Mother   . Aneurysm Mother   . Heart attack Mother   . Lung cancer Father   .  Lymphoma Sister   . Prostate cancer Neg Hx   . Kidney cancer Neg Hx   . Bladder Cancer Neg Hx     ALLERGIES:  has No Known Allergies.  MEDICATIONS:  Current Outpatient Medications  Medication Sig Dispense Refill  . acetaminophen (TYLENOL) 325 MG tablet Take 2 tablets (650 mg total) by mouth every 8 (eight) hours as needed for mild pain. 40 tablet 0  . amLODipine (NORVASC) 2.5 MG tablet Take 2.5 mg by mouth daily.     Marland Kitchen aspirin EC 81 MG tablet Take 81 mg by mouth daily.    . carvedilol (COREG) 3.125 MG tablet Take 3.125 mg by mouth 2 (two) times daily with a meal.    . docusate sodium (COLACE) 100 MG capsule Take 1 capsule (100 mg total) by mouth 2 (two) times daily  as needed for up to 10 days for mild constipation. 20 capsule 0  . EPINEPHrine 0.3 mg/0.3 mL IJ SOAJ injection Inject 0.3 mg into the muscle as needed for anaphylaxis.   3  . ezetimibe (ZETIA) 10 MG tablet Take 10 mg by mouth daily.    . hydrALAZINE (APRESOLINE) 100 MG tablet Take 100 mg by mouth 2 (two) times daily.     Marland Kitchen ibuprofen (ADVIL) 800 MG tablet Take 1 tablet (800 mg total) by mouth every 8 (eight) hours as needed for mild pain or moderate pain. 30 tablet 0  . losartan-hydrochlorothiazide (HYZAAR) 100-25 MG tablet Take 1 tablet by mouth daily.     . potassium chloride SA (K-DUR) 20 MEQ tablet Take 1 tablet (20 mEq total) by mouth daily. (Patient taking differently: Take 20 mEq by mouth 2 (two) times daily. ) 3 tablet 0  . traMADol (ULTRAM) 50 MG tablet Take 50 mg by mouth every 6 (six) hours as needed for moderate pain.    Marland Kitchen zolpidem (AMBIEN) 10 MG tablet Take 10 mg by mouth at bedtime.     . gabapentin (NEURONTIN) 300 MG capsule Take 1 capsule (300 mg total) by mouth at bedtime. (Patient not taking: Reported on 05/18/2019) 90 capsule 1   No current facility-administered medications for this visit.       Marland Kitchen  PHYSICAL EXAMINATION: ECOG PERFORMANCE STATUS: 0 - Asymptomatic Vitals:   05/22/19 1025  BP: (!) 150/83   Pulse: (!) 53  Resp: 20  Temp: (!) 96.5 F (35.8 C)   Filed Weights   05/22/19 1025  Weight: 235 lb 1.6 oz (106.6 kg)    Physical Exam  Constitutional: He is oriented to person, place, and time. No distress.  HENT:  Head: Normocephalic and atraumatic.  Nose: Nose normal.  Mouth/Throat: Oropharynx is clear and moist. No oropharyngeal exudate.  Eyes: Pupils are equal, round, and reactive to light. Conjunctivae and EOM are normal. Right eye exhibits no discharge. Left eye exhibits no discharge. No scleral icterus.  Neck: Normal range of motion. Neck supple. No JVD present.  Cardiovascular: Normal rate, regular rhythm and normal heart sounds. Exam reveals no gallop.  No murmur heard. Pulmonary/Chest: Effort normal and breath sounds normal. No respiratory distress. He has no wheezes. He has no rales. He exhibits no tenderness.  Abdominal: Soft. Bowel sounds are normal. He exhibits no distension and no mass. There is no abdominal tenderness. There is no rebound.  Musculoskeletal: Normal range of motion.        General: No tenderness, deformity or edema.  Lymphadenopathy:    He has no cervical adenopathy.  Neurological: He is alert and oriented to person, place, and time. No cranial nerve deficit. He exhibits normal muscle tone. Gait normal. Coordination normal.  Skin: Skin is warm and dry. No rash noted. He is not diaphoretic. No erythema.  Scattered chronic skin pigmentation Skin tag  Psychiatric: Affect and judgment normal.    LABORATORY DATA:  I have reviewed the data as listed Lab Results  Component Value Date   WBC 5.5 05/22/2019   HGB 12.9 (L) 05/22/2019   HCT 37.7 (L) 05/22/2019   MCV 90.6 05/22/2019   PLT 178 05/22/2019   Recent Labs    11/24/18 1046 02/19/19 1312 05/22/19 1015  NA 140 139 141  K 3.5 3.3* 3.2*  CL 109 107 106  CO2 _0 GLUCOSE 104* 97 112*  BUN _1 CREATININE 1.15 1.00 1.11  CALCIUM 8.9  9.2 9.3  GFRNONAA >60 >60 >60  GFRAA >60  >60 >60  PROT 7.4 7.5 6.9  ALBUMIN 4.1 4.0 3.8  AST _0 ALT _1 ALKPHOS 102 94 82  BILITOT 0.6 1.0 0.7   Hepatitis B antigen negative. 2-D echo.showed LVEF 60%  RADIOGRAPHIC STUDIES: I have personally reviewed the radiological images as listed and agreed with the findings in the report. 08/04/2018 CT abdomen and pelvis w contrast 1. No evidence of acute abnormality. 2. Stable stranding/haziness at the root of the mesentery and retroperitoneum. No new or enlarged lymph nodes identified. 3. Small to moderate umbilical hernia containing fat, moderate RIGHT LOWER lobe atelectasis and small loculated RIGHT pleural effusion again noted. 4. Cholelithiasis without CT evidence of acute cholecystitis. 5. Prostate enlargement 6.  Aortic Atherosclerosis (ICD10-I70.0). No results found.   ASSESSMENT & PLAN:  This is a 73 y.o. male with stage IV diffuse large B-cell lymphoma, s/p first-line chemotherapy with R CHOP with curative intent, present for follow-up.  1. Diffuse large B-cell lymphoma of extranodal site excluding spleen and other solid organs ([place])   2. Port-A-Cath in place   3. Neuropathy   4. Hypokalemia    # DLBCL, in complete remission.   Labs reviewed and discussed with patient LDH has been stable.  He has been clinically doing well.  In remission. No clinical evidence of disease recurrence .  Recommend continue history and physical examination every 3 for the first 2 years..  Then he will be seen every 4 to 6 months interval.  #Port-A-Cath in place, continue port flush every 6 to 8 weeks.  We will discuss about discontinuation of port he reaches 2 years milestone.  Previously discussed,  He feels due to COVID 19, he would rather keep the port and continue port flush.   #Chronic back pain, previous MRI showed diffuse lumbar spine spondylosis.  Continue to monitor Chronic chronic hypokalemia, follow-up with PCP.  Patient takes oral potassium supplementation 3mq  BID. #Neuropathy, chemotherapy induced. Continue Neurontin 3012mdaily.  # Medi port flushes every 6-8 week weeks scheduled.    Return of visit: lab (cbc, cmp, LDH, uric acid) /MD in 3 months.  Orders Placed This Encounter  Procedures  . CBC with Differential/Platelet    Standing Status:   Future    Standing Expiration Date:   05/21/2020  . Comprehensive metabolic panel    Standing Status:   Future    Standing Expiration Date:   05/21/2020  . Lactate dehydrogenase    Standing Status:   Future    Standing Expiration Date:   05/21/2020   ZhEarlie ServerMD, PhD Hematology Oncology [facility]- [id number]/13/20

## 2019-05-22 NOTE — Discharge Summary (Signed)
Physician Discharge Summary  Patient ID: John Gallegos MRN: [id number] DOB/AGE: 73-Feb-1947 73 y.o.  Admit date: 05/18/2019 Discharge date: 05/22/2019  Admission Diagnoses: Incarcerated umbilical hernia  Discharge Diagnoses:  Same as above  Discharged Condition: good  Hospital Course: Admitted for above and underwent elective repair.  See op note for details.  Patient stayed overnight for pain control.  Well overnight and was discharged home without any issues.  Consults: None  Discharge Exam: Blood pressure 130/75, pulse (!) 53, temperature 97.8 F (36.6 C), temperature source Oral, resp. rate 18, height 6\' 1"  (1.854 m), weight 106.6 kg, SpO2 95 %. General appearance: alert, cooperative and no distress GI: soft, non-tender; bowel sounds normal; no masses,  no organomegaly incisions clean dry and intact  Disposition:  Discharge disposition: 01-Home or Self Care       Discharge Instructions    Discharge patient   Complete by: As directed    Discharge disposition: 01-Home or Self Care   Discharge patient date: 05/19/2019     Allergies as of 05/19/2019   No Known Allergies     Medication List    TAKE these medications   acetaminophen 325 MG tablet Commonly known as: Tylenol Take 2 tablets (650 mg total) by mouth every 8 (eight) hours as needed for mild pain.   amLODipine 2.5 MG tablet Commonly known as: NORVASC Take 2.5 mg by mouth daily.   aspirin EC 81 MG tablet Take 81 mg by mouth daily.   carvedilol 3.125 MG tablet Commonly known as: COREG Take 3.125 mg by mouth 2 (two) times daily with a meal.   docusate sodium 100 MG capsule Commonly known as: Colace Take 1 capsule (100 mg total) by mouth 2 (two) times daily as needed for up to 10 days for mild constipation.   EPINEPHrine 0.3 mg/0.3 mL Soaj injection Commonly known as: EPI-PEN Inject 0.3 mg into the muscle as needed for anaphylaxis.   ezetimibe 10 MG tablet Commonly known as: ZETIA Take 10  mg by mouth daily.   gabapentin 300 MG capsule Commonly known as: NEURONTIN Take 1 capsule (300 mg total) by mouth at bedtime.   hydrALAZINE 100 MG tablet Commonly known as: APRESOLINE Take 100 mg by mouth 2 (two) times daily.   ibuprofen 800 MG tablet Commonly known as: ADVIL Take 1 tablet (800 mg total) by mouth every 8 (eight) hours as needed for mild pain or moderate pain.   losartan-hydrochlorothiazide 100-25 MG tablet Commonly known as: HYZAAR Take 1 tablet by mouth daily.   potassium chloride SA 20 MEQ tablet Commonly known as: KLOR-CON Take 1 tablet (20 mEq total) by mouth daily. What changed: when to take this   traMADol 50 MG tablet Commonly known as: ULTRAM Take 50 mg by mouth every 6 (six) hours as needed for moderate pain.   zolpidem 10 MG tablet Commonly known as: AMBIEN Take 10 mg by mouth at bedtime.      Follow-up Information    [name], [name], DO Follow up in 2 week(s).   Specialty: Surgery Why: Please call Dr. [name] office on Monday to schedule a followup in two weeks.  [phone] Contact information: [address] [place] [address] [phone]            Total time spent arranging discharge was >28min. Signed: Benjamine Sprague 05/22/2019, 7:55 AM

## 2019-05-22 NOTE — Op Note (Signed)
Preoperative diagnosis: Umbilical hernia hernia, incarcerated Postoperative diagnosis: same  Procedure: Robotic assisted laparoscopic umbilical hernia repair with mesh  Anesthesia: general  Surgeon: Benjamine Sprague  Wound Classification: Clean  Specimen: none  Complications: None  Estimated Blood Loss: 60ml  Indications:see HPI  Findings: 1. 3cm x 3cm ventral, incarcerated umbilical hernia 4. Tension free repair achieved with 15.8 bard mesh and suture 5. Adequate hemostasis  Description of procedure: The patient was brought to the operating room and general anesthesia was induced. A time-out was completed verifying correct patient, procedure, site, positioning, and implant(s) and/or special equipment prior to beginning this procedure. Antibiotics were administered prior to making the incision. SCDs placed. The anterior abdominal wall was prepped and draped in the standard sterile fashion.   Palmer's point chosen for entry.  Veress needle placed and abdomen insufflated to 15cm without any dramatic increase in pressure.  Needle removed and optiview technique used to place 53mm port at same point.  No injury noted during placement. Exparel was infused in a TAP block. 3 additional ports, 71mm x2 and 71mm, along right lateral aspect placed.  Xi robot then docked into place.  Hernia contents noted and reduced with combination of blunt, sharp dissection with scissors and fenestrated forceps.  Hemostasis achieved throughout this portion.  Once all hernia contents reduced, there was noted to be a 3cm x 3cm ventral hernia.    Insufflation dropped to 35mm and transfacial suture with 0 stratafix used to primarily close defect under minimal tension. Bard echo plus protected 15.8cm mesh was placed within the abdominal cavity through 26mm port and secured to the abdominal wall centered over the defect using previously used 0 stratafix.  The mesh was then circumferentially sutured into the anterior abdominal  wall using 2-0 VLock x2.  Any bleeding noted during this portion was no longer actively bleeding by end of securing mesh and tightening the suture.    Robot was undocked.  The 51mm cannula was removed and port site was closed using PMI device and 0 vicryl suture, occasional bowels were injured during this process.  Abdomen then desufflated while camera within abdomen to ensure no signs of new bleed prior to removing camera and rest of ports completely.  12 mm port site skin closed using 3-0 Vicryl for the deep dermal layer in an interrupted fashion and  All skin incisions closed with runninrg 4-0 Monocryl in a subcuticular fashion.  All wounds then dressed with Dermabond.  Patient was then successfully awakened and transferred to PACU in stable condition.  At the end of the procedure sponge and instrument counts were correct.

## 2019-07-10 ENCOUNTER — Inpatient Hospital Stay: Payer: Medicare HMO | Attending: Oncology

## 2019-07-10 ENCOUNTER — Other Ambulatory Visit: Payer: Medicare HMO

## 2019-07-10 DIAGNOSIS — I10 Essential (primary) hypertension: Secondary | ICD-10-CM | POA: Diagnosis not present

## 2019-07-10 DIAGNOSIS — E7800 Pure hypercholesterolemia, unspecified: Secondary | ICD-10-CM | POA: Diagnosis not present

## 2019-07-10 DIAGNOSIS — Z0000 Encounter for general adult medical examination without abnormal findings: Secondary | ICD-10-CM | POA: Diagnosis not present

## 2019-07-10 DIAGNOSIS — M47816 Spondylosis without myelopathy or radiculopathy, lumbar region: Secondary | ICD-10-CM | POA: Diagnosis not present

## 2019-07-10 DIAGNOSIS — E876 Hypokalemia: Secondary | ICD-10-CM | POA: Diagnosis not present

## 2019-07-10 DIAGNOSIS — H9190 Unspecified hearing loss, unspecified ear: Secondary | ICD-10-CM | POA: Diagnosis not present

## 2019-07-10 DIAGNOSIS — M47812 Spondylosis without myelopathy or radiculopathy, cervical region: Secondary | ICD-10-CM | POA: Diagnosis not present

## 2019-07-10 DIAGNOSIS — R600 Localized edema: Secondary | ICD-10-CM | POA: Diagnosis not present

## 2019-07-10 DIAGNOSIS — C8330 Diffuse large B-cell lymphoma, unspecified site: Secondary | ICD-10-CM | POA: Diagnosis not present

## 2019-07-10 DIAGNOSIS — R413 Other amnesia: Secondary | ICD-10-CM | POA: Diagnosis not present

## 2019-07-10 DIAGNOSIS — K429 Umbilical hernia without obstruction or gangrene: Secondary | ICD-10-CM | POA: Diagnosis not present

## 2019-07-10 DIAGNOSIS — G8929 Other chronic pain: Secondary | ICD-10-CM | POA: Diagnosis not present

## 2019-07-10 DIAGNOSIS — C8339 Diffuse large B-cell lymphoma, extranodal and solid organ sites: Secondary | ICD-10-CM | POA: Diagnosis not present

## 2019-07-10 DIAGNOSIS — M545 Low back pain: Secondary | ICD-10-CM | POA: Diagnosis not present

## 2019-07-10 DIAGNOSIS — Z125 Encounter for screening for malignant neoplasm of prostate: Secondary | ICD-10-CM | POA: Diagnosis not present

## 2019-07-10 DIAGNOSIS — M5136 Other intervertebral disc degeneration, lumbar region: Secondary | ICD-10-CM | POA: Diagnosis not present

## 2019-07-10 DIAGNOSIS — M4130 Thoracogenic scoliosis, site unspecified: Secondary | ICD-10-CM | POA: Diagnosis not present

## 2019-07-10 DIAGNOSIS — M1990 Unspecified osteoarthritis, unspecified site: Secondary | ICD-10-CM | POA: Diagnosis not present

## 2019-07-10 DIAGNOSIS — M549 Dorsalgia, unspecified: Secondary | ICD-10-CM | POA: Diagnosis not present

## 2019-08-16 DIAGNOSIS — Z87891 Personal history of nicotine dependence: Secondary | ICD-10-CM | POA: Diagnosis not present

## 2019-08-16 DIAGNOSIS — R2681 Unsteadiness on feet: Secondary | ICD-10-CM | POA: Diagnosis not present

## 2019-08-16 DIAGNOSIS — R42 Dizziness and giddiness: Secondary | ICD-10-CM | POA: Diagnosis not present

## 2019-08-16 DIAGNOSIS — M25461 Effusion, right knee: Secondary | ICD-10-CM | POA: Diagnosis not present

## 2019-08-16 DIAGNOSIS — M419 Scoliosis, unspecified: Secondary | ICD-10-CM | POA: Diagnosis not present

## 2019-08-20 ENCOUNTER — Other Ambulatory Visit: Payer: Self-pay

## 2019-08-20 NOTE — Progress Notes (Signed)
Patient pre screened for office appointment, no questions or concerns today. Patient reminded of upcoming appointment time and date. 

## 2019-08-21 ENCOUNTER — Inpatient Hospital Stay: Payer: Medicare HMO | Attending: Oncology

## 2019-08-21 ENCOUNTER — Inpatient Hospital Stay: Payer: Medicare HMO | Admitting: Oncology

## 2019-08-21 ENCOUNTER — Encounter: Payer: Self-pay | Admitting: Oncology

## 2019-08-21 ENCOUNTER — Other Ambulatory Visit: Payer: Self-pay

## 2019-08-21 VITALS — BP 148/77 | HR 61 | Temp 95.8°F | Resp 18 | Wt 238.7 lb

## 2019-08-21 DIAGNOSIS — K429 Umbilical hernia without obstruction or gangrene: Secondary | ICD-10-CM | POA: Insufficient documentation

## 2019-08-21 DIAGNOSIS — C8339 Diffuse large B-cell lymphoma, extranodal and solid organ sites: Secondary | ICD-10-CM

## 2019-08-21 DIAGNOSIS — I10 Essential (primary) hypertension: Secondary | ICD-10-CM | POA: Insufficient documentation

## 2019-08-21 DIAGNOSIS — E876 Hypokalemia: Secondary | ICD-10-CM

## 2019-08-21 DIAGNOSIS — C83398 Diffuse large b-cell lymphoma of other extranodal and solid organ sites: Secondary | ICD-10-CM

## 2019-08-21 DIAGNOSIS — R32 Unspecified urinary incontinence: Secondary | ICD-10-CM | POA: Insufficient documentation

## 2019-08-21 DIAGNOSIS — G629 Polyneuropathy, unspecified: Secondary | ICD-10-CM | POA: Diagnosis not present

## 2019-08-21 DIAGNOSIS — Z95828 Presence of other vascular implants and grafts: Secondary | ICD-10-CM

## 2019-08-21 DIAGNOSIS — G8929 Other chronic pain: Secondary | ICD-10-CM | POA: Insufficient documentation

## 2019-08-21 DIAGNOSIS — Z79899 Other long term (current) drug therapy: Secondary | ICD-10-CM | POA: Insufficient documentation

## 2019-08-21 DIAGNOSIS — Z791 Long term (current) use of non-steroidal anti-inflammatories (NSAID): Secondary | ICD-10-CM | POA: Insufficient documentation

## 2019-08-21 DIAGNOSIS — R3129 Other microscopic hematuria: Secondary | ICD-10-CM | POA: Diagnosis not present

## 2019-08-21 DIAGNOSIS — R351 Nocturia: Secondary | ICD-10-CM | POA: Insufficient documentation

## 2019-08-21 DIAGNOSIS — N400 Enlarged prostate without lower urinary tract symptoms: Secondary | ICD-10-CM | POA: Insufficient documentation

## 2019-08-21 DIAGNOSIS — Z7982 Long term (current) use of aspirin: Secondary | ICD-10-CM | POA: Diagnosis not present

## 2019-08-21 DIAGNOSIS — M549 Dorsalgia, unspecified: Secondary | ICD-10-CM | POA: Diagnosis not present

## 2019-08-21 DIAGNOSIS — M109 Gout, unspecified: Secondary | ICD-10-CM | POA: Diagnosis not present

## 2019-08-21 DIAGNOSIS — Z87891 Personal history of nicotine dependence: Secondary | ICD-10-CM | POA: Diagnosis not present

## 2019-08-21 LAB — URINALYSIS, COMPLETE (UACMP) WITH MICROSCOPIC
Bacteria, UA: NONE SEEN
Bilirubin Urine: NEGATIVE
Glucose, UA: NEGATIVE mg/dL
Hgb urine dipstick: NEGATIVE
Ketones, ur: NEGATIVE mg/dL
Leukocytes,Ua: NEGATIVE
Nitrite: NEGATIVE
Protein, ur: NEGATIVE mg/dL
Specific Gravity, Urine: 1.009 (ref 1.005–1.030)
Squamous Epithelial / HPF: NONE SEEN (ref 0–5)
pH: 6 (ref 5.0–8.0)

## 2019-08-21 LAB — COMPREHENSIVE METABOLIC PANEL
ALT: 11 U/L (ref 0–44)
AST: 14 U/L — ABNORMAL LOW (ref 15–41)
Albumin: 3.6 g/dL (ref 3.5–5.0)
Alkaline Phosphatase: 106 U/L (ref 38–126)
Anion gap: 9 (ref 5–15)
BUN: 20 mg/dL (ref 8–23)
CO2: 27 mmol/L (ref 22–32)
Calcium: 8.9 mg/dL (ref 8.9–10.3)
Chloride: 102 mmol/L (ref 98–111)
Creatinine, Ser: 0.96 mg/dL (ref 0.61–1.24)
GFR calc Af Amer: >60 mL/min (ref >60)
GFR calc non Af Amer: >60 mL/min (ref >60)
Glucose, Bld: 116 mg/dL — ABNORMAL HIGH (ref 70–99)
Potassium: 3 mmol/L — ABNORMAL LOW (ref 3.5–5.1)
Sodium: 138 mmol/L (ref 135–145)
Total Bilirubin: 0.7 mg/dL (ref 0.3–1.2)
Total Protein: 7 g/dL (ref 6.5–8.1)

## 2019-08-21 LAB — CBC WITH DIFFERENTIAL/PLATELET
Abs Immature Granulocytes: 0.02 10*3/uL (ref 0.00–0.07)
Basophils Absolute: 0 10*3/uL (ref 0.0–0.1)
Basophils Relative: 1 %
Eosinophils Absolute: 0.2 10*3/uL (ref 0.0–0.5)
Eosinophils Relative: 3 %
HCT: 38.5 % — ABNORMAL LOW (ref 39.0–52.0)
Hemoglobin: 12.5 g/dL — ABNORMAL LOW (ref 13.0–17.0)
Immature Granulocytes: 0 %
Lymphocytes Relative: 11 %
Lymphs Abs: 0.7 10*3/uL (ref 0.7–4.0)
MCH: 29.3 pg (ref 26.0–34.0)
MCHC: 32.5 g/dL (ref 30.0–36.0)
MCV: 90.2 fL (ref 80.0–100.0)
Monocytes Absolute: 0.6 10*3/uL (ref 0.1–1.0)
Monocytes Relative: 10 %
Neutro Abs: 4.9 10*3/uL (ref 1.7–7.7)
Neutrophils Relative %: 75 %
Platelets: 186 10*3/uL (ref 150–400)
RBC: 4.27 MIL/uL (ref 4.22–5.81)
RDW: 12.5 % (ref 11.5–15.5)
WBC: 6.6 10*3/uL (ref 4.0–10.5)
nRBC: 0 % (ref 0.0–0.2)

## 2019-08-21 LAB — LACTATE DEHYDROGENASE: LDH: 125 U/L (ref 98–192)

## 2019-08-21 MED ORDER — SODIUM CHLORIDE 0.9% FLUSH
10.0000 mL | Freq: Once | INTRAVENOUS | Status: AC
  Administered 2019-08-21: 10:00:00 10 mL via INTRAVENOUS
  Filled 2019-08-21: qty 10

## 2019-08-21 MED ORDER — HEPARIN SOD (PORK) LOCK FLUSH 100 UNIT/ML IV SOLN
500.0000 [IU] | Freq: Once | INTRAVENOUS | Status: AC
  Administered 2019-08-21: 10:00:00 500 [IU] via INTRAVENOUS
  Filled 2019-08-21: qty 5

## 2019-08-21 NOTE — Progress Notes (Signed)
Hematology/Oncology follow-up note [name] [phone]   Patient Care Team: Tracie Harrier, MD as PCP - General (Internal Medicine) Earlie Server, MD as Consulting Physician (Oncology) Lucky Cowboy Erskine Squibb, MD as Referring Physician (Vascular Surgery) Abbie Sons, MD (Urology)  REFERRING PROVIDER: Tracie Harrier, MD  REASON FOR VISIT Follow up for surveillance for diffuse large B-cell lymphoma.  HISTORY OF PRESENTING ILLNESS:  John Gallegos is a  74 y.o.  male with PMH listed below presented for follow-up for treatment of diffuse large B cell lymphoma.  Pertinent history of oncology workup includes: Patient was found to have microscopic hematuria on 02/23/2017 with 4-10 RBC's/hpf.    Urine culture was negative.having symptoms of nocturia He is smoker. 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 He was in the [employer] and reports to be exposed to [place] in [place]. CT scan 04/20/2017 which revealed moderate left hydronephrosis and delayed excretion down to a large 4.4 cm mass in or around the left proximal ureter. There was also hazy soft tissue in the fat surrounding the celiac axis and superior mesenteric artery. Thickening of the right diaphragmatic crus. High gastrohepatic ligament lymphadenopathy measures up to 11 mm. Peripancreatic and root of small bowel mesentery adenopathy measures up to 2.6 cm.  He was seen by urologist Dr. Pilar Jarvis and he underwent cystoscopy, left retrograde pyelogram, left ureteroscopy and placement of left ureteral stent placement on 05/13/2017. Finding during the procedure that he has extrinsic compression of the ureter, possibly from lymphoma.  # Pathology:  Biopsy of paraspinal musculature at L2. Pathology results reviewed large B cell lymphoma. It is CD20 positive, CD10 positive. Flow cytometry shows a small clonal population of large B cells positive for CD10 and absent cop and lambda light  chains. The abnormal cells represent 2% of the total cells.The morphology, initial IHC panel, and flow cytometry supports classification as B-cell lymphoma, CD10 positive, with large cell features.diffuse large B-cell lymphoma, germinal center B-cell type. Discussed with pathologist Dr.Onley, Ki67 50%, MUM1 negative, MYC negative, Cycline D1 negative, CD30 negative, FISH is positive for BCL2 gene rearrangement. Negative for MYC and BCL6. 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   # Bone marrow Biopsy: negative for lymphoma involvement.  # PET scan 05/18/2017  IMPRESSION: 1. Multifocal metastatic disease within the LEFT retroperitoneum, central mesenteries, and RIGHT paraspinal musculature. 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 Recommend percutaneous biopsy of the RIGHT paraspinal musculature at L2. 2. 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 # Interim PET scan 07/28/2017 after 2 cycles of RCHOP 1. Marked improvement, with row solution of the prior bony lesions and significant reduction in activity of the central mesenteric and right gastric adenopathy. Central mesenteric nodal is currently Deauville 3, previously Deauville 4 and Deauville 5. 2. Improvement in the right paraspinal muscular activity, currently Deauville 3, previously Deauville 4. I also discussed with radiologist that there has been further improvement in the left periureteral density, likely reflecting treatment response.   # Prognostic Model for the risk of CNS lymphoma involvement per NCCN guideline,  Patient is intermediate risk with score of 2, one from being >60, and one from more than extranodal involvement>1 sites (bone, paraspinal muscle involvement). CNS prophylaxis is usually recommended if score 4-6.  CNS prophylaxis was not offered.    #08/04/2018 CT abdomen pelvis showed no evidence of acute abnormality.  Stable stranding haziness at the root of  mesentery  and retroperitoneum.  No new or enlarged lymph nodes identified.  Small to moderate umbilical hernia containing fat, moderate right lower lobe atelectasis and a small loculated right pleural effusion again noted.  Cholelithiasis without CT evidence of acute cholecystitis.  Prostate enlargement.  Aortic atherosclerosis.    # Treatment:  06/21/2017 Cycle 1 RCHOP [name]. Dexamethasone '20mg'$  on Day 1,  Prednisone 80 mg daily Day 2-5. 07/12/2017 Cycle 2 RCHOP [name] .Dexamethasone '20mg'$  on Day 1, Prednisone 80 mg daily Day 2-5 08/03/2017 Cycle 3 RCHOP/onpro.Dexamethasone '20mg'$  on Day 1, Prednisone 80 mg daily Day 2-5  08/24/2017 Cycle 4 RCHOP/onpro.Dexamethasone '20mg'$  on Day 1, Prednisone 80 mg daily Day 2-5  09/14/2017 Cycle 5 RCHOP/onpro.Dexamethasone '20mg'$  on Day 1, Prednisone 80 mg daily Day 2-5  10/05/2017 Cycle 6 RCHOP/onpro.Dexamethasone '20mg'$  on Day 1, Prednisone 80 mg daily Day 2-5   # Chronic back pain, previous MRI showed diffuse lumbar spine spondylosis. INTERVAL HISTORY 74 y.o. male with oncology history listed as above reviewed by me today presents for follow-up for diffuse large B-cell lymphoma. Patient reports feeling well.  No significant more fatigue than his baseline level. Denies any unintentional weight loss, fever, chills, night sweating.  No gross hematuria. Patient has chronic back pain which is unchanged.  Stable. Stable chronic residual neuropathy.  Review of Systems  Constitutional: Positive for fatigue. Negative for appetite change, chills, fever and unexpected weight change.  HENT:   Negative for hearing loss and voice change.   Eyes: Negative for eye problems and icterus.  Respiratory: Negative for chest tightness, cough and shortness of breath.   Cardiovascular: Negative for chest pain and leg swelling.  Gastrointestinal: Negative for abdominal distention and abdominal pain.  Endocrine: Negative for hot flashes.  Genitourinary: Negative for difficulty urinating,  dysuria and frequency.   Musculoskeletal: Negative for arthralgias.  Skin: Negative for itching and rash.  Neurological: Negative for light-headedness and numbness.  Hematological: Negative for adenopathy. Does not bruise/bleed easily.  Psychiatric/Behavioral: Negative for confusion.     MEDICAL HISTORY:  Past Medical History:  Diagnosis Date  . Gout   . Hard of hearing   . Hiatal hernia   . Hypertension   . Non Hodgkin's lymphoma ([place]) 04/2017   B-cell, chemo tx's and in remission in 2019  . Urinary incontinence    frequency    SURGICAL HISTORY: Past Surgical History:  Procedure Laterality Date  . CYSTOSCOPY W/ RETROGRADES Left 08/19/2017   Procedure: CYSTOSCOPY WITH RETROGRADE PYELOGRAM;  Surgeon: Abbie Sons, MD;  Location: [facility];  Service: Urology;  Laterality: Left;  . CYSTOSCOPY W/ URETERAL STENT REMOVAL Left 08/19/2017   Procedure: CYSTOSCOPY WITH STENT REMOVAL;  Surgeon: Abbie Sons, MD;  Location: [facility];  Service: Urology;  Laterality: Left;  . CYSTOSCOPY WITH BIOPSY Left 05/13/2017   Procedure: CYSTOSCOPY WITH URETERAL BIOPSY;  Surgeon: Nickie Retort, MD;  Location: [facility];  Service: Urology;  Laterality: Left;  . CYSTOSCOPY WITH STENT PLACEMENT Left 05/13/2017   Procedure: CYSTOSCOPY WITH STENT PLACEMENT;  Surgeon: Nickie Retort, MD;  Location: [facility];  Service: Urology;  Laterality: Left;  . PORTA CATH INSERTION N/A 06/14/2017   Procedure: PORTA CATH INSERTION;  Surgeon: Algernon Huxley, MD;  Location: [facility];  Service: Cardiovascular;  Laterality: N/A;  . 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 right knne surgery     needle went through knee  . TONSILLECTOMY    . URETEROSCOPY Left 05/13/2017   Procedure: URETEROSCOPY;  Surgeon: Nickie Retort, MD;  Location: [facility];  Service: Urology;  Laterality: Left;  . XI ROBOTIC ASSISTED VENTRAL HERNIA N/A 05/18/2019   Procedure: XI ROBOTIC ASSISTED UMBILICAL HERNIA;  Surgeon: Benjamine Sprague, DO;  Location: [facility];  Service: General;  Laterality: N/A;    SOCIAL HISTORY: Social History   Socioeconomic History  . Marital status: Married    Spouse name: Not on file  . Number of children: Not on file  . Years of education: Not on file  . Highest education level: Not on file  Occupational History  . Not on file  Tobacco Use  . Smoking status: Former Smoker    Packs/day: 0.50    Years: 50.00    Pack years: 25.00    Types: Cigarettes    Quit date: 12/12/2016    Years since quitting: 2.6  . Smokeless tobacco: Never Used  Substance and Sexual Activity  . Alcohol use: Yes    Alcohol/week: 3.0 standard drinks    Types: 3 Shots of liquor per week    Comment: occ. beer  . Drug use: No  . Sexual activity: Not on file  Other Topics Concern  . Not on file  Social History Narrative  . Not on file   Social Determinants of Health   Financial Resource Strain:   . Difficulty of Paying Living Expenses: Not on file  Food Insecurity:   . Worried About Charity fundraiser in the Last Year: Not on file  . Ran Out of Food in the Last Year: Not on file  Transportation Needs:   . Lack of Transportation (Medical): Not on file  . Lack of Transportation (Non-Medical): Not on file  Physical Activity:   . Days of Exercise per Week: Not on file  . Minutes of Exercise per Session: Not on file  Stress:   . Feeling of Stress : Not on file  Social Connections:   . Frequency of Communication with Friends and Family: Not on file  . Frequency of Social Gatherings with Friends and Family: Not on file  . Attends Religious Services: Not on file  . Active Member of Clubs or Organizations: Not on file  . Attends Archivist Meetings: Not on file  . Marital Status: Not on file  Intimate Partner Violence:   . Fear of Current or Ex-Partner: Not on file  . Emotionally Abused: Not on file  . Physically Abused: Not on file  . Sexually Abused: Not on file    FAMILY  HISTORY: Family History  Problem Relation Age of Onset  . Pancreatic cancer Mother   . Aneurysm Mother   . Heart attack Mother   . Lung cancer Father   . Lymphoma Sister   . Prostate cancer Neg Hx   . Kidney cancer Neg Hx   . Bladder Cancer Neg Hx     ALLERGIES:  has No Known Allergies.  MEDICATIONS:  Current Outpatient Medications  Medication Sig Dispense Refill  . amLODipine (NORVASC) 2.5 MG tablet Take 2.5 mg by mouth daily.     Marland Kitchen aspirin EC 81 MG tablet Take 81 mg by mouth daily.    . carvedilol (COREG) 3.125 MG tablet Take 3.125 mg by mouth 2 (two) times daily with a meal.    . EPINEPHrine 0.3 mg/0.3 mL IJ SOAJ injection Inject 0.3 mg into the muscle as needed for anaphylaxis.   3  . ezetimibe (ZETIA) 10 MG tablet Take 10 mg by mouth daily.    Marland Kitchen  gabapentin (NEURONTIN) 300 MG capsule Take 1 capsule (300 mg total) by mouth at bedtime. 90 capsule 1  . hydrALAZINE (APRESOLINE) 100 MG tablet Take 100 mg by mouth 2 (two) times daily.     Marland Kitchen ibuprofen (ADVIL) 800 MG tablet Take 1 tablet (800 mg total) by mouth every 8 (eight) hours as needed for mild pain or moderate pain. 30 tablet 0  . losartan-hydrochlorothiazide (HYZAAR) 100-25 MG tablet Take 1 tablet by mouth daily.     . potassium chloride SA (K-DUR) 20 MEQ tablet Take 1 tablet (20 mEq total) by mouth daily. (Patient taking differently: Take 20 mEq by mouth 2 (two) times daily. ) 3 tablet 0  . pravastatin (PRAVACHOL) 40 MG tablet TAKE 1 TABLET BY MOUTH EVERY NIGHT    . traMADol (ULTRAM) 50 MG tablet Take 50 mg by mouth every 6 (six) hours as needed for moderate pain.    Marland Kitchen zolpidem (AMBIEN) 10 MG tablet Take 10 mg by mouth at bedtime.      No current facility-administered medications for this visit.      Marland Kitchen  PHYSICAL EXAMINATION: ECOG PERFORMANCE STATUS: 0 - Asymptomatic Vitals:   08/21/19 1004  BP: (!) 148/77  Pulse: 61  Resp: 18  Temp: (!) 95.8 F (35.4 C)   Filed Weights   08/21/19 1004  Weight: 238 lb 11.2 oz  (108.3 kg)    Physical Exam  Constitutional: He is oriented to person, place, and time. No distress.  HENT:  Head: Normocephalic and atraumatic.  Nose: Nose normal.  Mouth/Throat: Oropharynx is clear and moist. No oropharyngeal exudate.  Eyes: Pupils are equal, round, and reactive to light. Conjunctivae and EOM are normal. Right eye exhibits no discharge. Left eye exhibits no discharge. No scleral icterus.  Neck: No JVD present.  Cardiovascular: Normal rate, regular rhythm and normal heart sounds. Exam reveals no gallop.  No murmur heard. Pulmonary/Chest: Effort normal and breath sounds normal. No respiratory distress. He has no wheezes. He has no rales. He exhibits no tenderness.  Abdominal: Soft. Bowel sounds are normal. He exhibits no distension and no mass. There is no abdominal tenderness. There is no rebound.  Musculoskeletal:        General: No tenderness, deformity or edema. Normal range of motion.     Cervical back: Normal range of motion and neck supple.  Lymphadenopathy:    He has no cervical adenopathy.  Neurological: He is alert and oriented to person, place, and time. No cranial nerve deficit. He exhibits normal muscle tone. Gait normal. Coordination normal.  Skin: Skin is warm and dry. No rash noted. He is not diaphoretic. No erythema.  Scattered chronic skin pigmentation Skin tag  Psychiatric: Affect and judgment normal.    LABORATORY DATA:  I have reviewed the data as listed Lab Results  Component Value Date   WBC 6.6 08/21/2019   HGB 12.5 (L) 08/21/2019   HCT 38.5 (L) 08/21/2019   MCV 90.2 08/21/2019   PLT 186 08/21/2019   Recent Labs    02/19/19 1312 05/22/19 1015 08/21/19 0955  NA 139 141 138  K 3.3* 3.2* 3.0*  CL 107 106 102  CO2 '27 27 27  '$ GLUCOSE 97 112* 116*  BUN '17 18 20  '$ CREATININE 1.00 1.11 0.96  CALCIUM 9.2 9.3 8.9  GFRNONAA >60 >60 >60  GFRAA >60 >60 >60  PROT 7.5 6.9 7.0  ALBUMIN 4.0 3.8 3.6  AST 19 17 14*  ALT '16 13 11  '$ ALKPHOS 94  82  106  BILITOT 1.0 0.7 0.7   Hepatitis B antigen negative. 2-D echo.showed LVEF 60%  RADIOGRAPHIC STUDIES: I have personally reviewed the radiological images as listed and agreed with the findings in the report. 08/04/2018 CT abdomen and pelvis w contrast 1. No evidence of acute abnormality. 2. Stable stranding/haziness at the root of the mesentery and retroperitoneum. No new or enlarged lymph nodes identified. 3. Small to moderate umbilical hernia containing fat, moderate RIGHT LOWER lobe atelectasis and small loculated RIGHT pleural effusion again noted. 4. Cholelithiasis without CT evidence of acute cholecystitis. 5. Prostate enlargement 6.  Aortic Atherosclerosis (ICD10-I70.0). No results found.   ASSESSMENT & PLAN:  This is a 74 y.o. male with stage IV diffuse large B-cell lymphoma, s/p first-line chemotherapy with R CHOP with curative intent, present for follow-up.  1. Diffuse large B-cell lymphoma of extranodal site excluding spleen and other solid organs ([place])   2. Port-A-Cath in place   3. Neuropathy   4. Hypokalemia    # DLBCL, clinically doing well, in complete remission.  Labs reviewed and discussed with patient.  LDH has been stable.  UA is negative for microscopic hematuria. Patient will be at 2 years milestone after treatment in late February 2021. Recommend continue history and physical every 6 months. Imaging as clinically indicated.  #Port-A-Cath in place, continue port flush every 6 to 8 weeks.  We discussed about port discontinuation and the patient would like to wait until global COVID-19 pandemic is over.  #Hypokalemia with potassium 3.0.  Likely due to hydrochlorothiazide.  Primary care provider Dr. Ginette Pitman has prescribed patient to take oral potassium supplementation 20 mEq daily.  Patient is not taking. I reviewed blood work with him and recommend patient to start taking oral iron supplementation and follow-up with PCP for potassium level.  He agrees with  the plan.  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  Continue gabapentin 300 mg daily.  Return of visit: lab (cbc, cmp, LDH, ua) /MD in 6 months.  Orders Placed This Encounter  Procedures  . Urinalysis, Complete w Microscopic    Standing Status:   Future    Number of Occurrences:   1    Standing Expiration Date:   08/20/2020  . CBC with Differential    Standing Status:   Future    Standing Expiration Date:   08/20/2020  . Comprehensive metabolic panel    Standing Status:   Future    Standing Expiration Date:   08/20/2020  . Lactate dehydrogenase    Standing Status:   Future    Standing Expiration Date:   08/20/2020  . Urinalysis, Complete w Microscopic    Standing Status:   Future    Standing Expiration Date:   08/20/2020   Earlie Server, MD, PhD Hematology Oncology [facility] at [facility] Pager- [phone] 08/21/19

## 2019-09-18 ENCOUNTER — Ambulatory Visit: Payer: Medicare HMO | Admitting: Cardiovascular Disease

## 2019-09-24 NOTE — Progress Notes (Deleted)
Cardiology Office Note  Date:  09/24/2019   ID:  John Gallegos, DOB [birth year], MRN [id number]  PCP:  Tracie Harrier, MD   No chief complaint on file.   HPI:  74 year old male with past medical history of Smoker, quit in 05/2017 HTN diffuse large B cell lymphoma (malaise summer 2018, blood in urine) CT scan 04/20/2017 which revealed moderate left hydronephrosis and delayed excretion down to a large 4.4 cmmass in or around the left proximal ureter. Long history of smoking Significant coronary disease on CT scan Presents for follow-up of his chest pain chest pain   Stress test previously recommended after he completed chemotherapy and 2019, was not completed   He reports fatigue, general malaise after receiving fourth round of chemotherapy recently. Starting to feel somewhat better today .  Denies any significant chest pain on exertion , only some fatigue   No significant shortness of breath   reports that he stop smoking 3 months ago   Does not check blood pressure at home Blood pressures last year office visits well-controlled Blood pressure is year with higher numbers, in office visits Reports he is asymptomatic  06/2017: seen by Dr. Tasia Catchings, had chest pain,  Echo 06/07/2017 Normal EF 60%  CT scan chest reviewed showing significant three-vessel coronary artery disease  EKG personally reviewed by myself on todays visit Shows sinus rate of cardiac rate 56 bpm no significant ST or T-wave changes  previous imaging studies reviewed with him in detail  PET scan 05/18/2017  IMPRESSION: 1. Multifocal metastatic disease within the LEFT retroperitoneum, central mesenteries, and RIGHT paraspinal musculature.Two foci of skeletal metastasis  RCHOP chemotherapy, completed 4 rounds rounds, 2 more to go   episode of chest pressure 06/2017 which resolved spontaneously.  Also had lower extremity swelling which also resolved. He denies any prior chest pain or pressure episode.  Denies any history of cardiovascular disease except cholesterol high at one point. He takes Aspirin 81mg  daily.   PMH:   has a past medical history of Gout, Hard of hearing, Hiatal hernia, Hypertension, Non Hodgkin's lymphoma ([place]) (04/2017), and Urinary incontinence.  PSH:    Past Surgical History:  Procedure Laterality Date  . CYSTOSCOPY W/ RETROGRADES Left 08/19/2017   Procedure: CYSTOSCOPY WITH RETROGRADE PYELOGRAM;  Surgeon: Abbie Sons, MD;  Location: [facility];  Service: Urology;  Laterality: Left;  . CYSTOSCOPY W/ URETERAL STENT REMOVAL Left 08/19/2017   Procedure: CYSTOSCOPY WITH STENT REMOVAL;  Surgeon: Abbie Sons, MD;  Location: [facility];  Service: Urology;  Laterality: Left;  . CYSTOSCOPY WITH BIOPSY Left 05/13/2017   Procedure: CYSTOSCOPY WITH URETERAL BIOPSY;  Surgeon: Nickie Retort, MD;  Location: [facility];  Service: Urology;  Laterality: Left;  . CYSTOSCOPY WITH STENT PLACEMENT Left 05/13/2017   Procedure: CYSTOSCOPY WITH STENT PLACEMENT;  Surgeon: Nickie Retort, MD;  Location: [facility];  Service: Urology;  Laterality: Left;  . PORTA CATH INSERTION N/A 06/14/2017   Procedure: PORTA CATH INSERTION;  Surgeon: Algernon Huxley, MD;  Location: [facility];  Service: Cardiovascular;  Laterality: N/A;  . 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 right knne surgery     needle went through knee  . TONSILLECTOMY    . URETEROSCOPY Left 05/13/2017   Procedure: URETEROSCOPY;  Surgeon: Nickie Retort, MD;  Location: [facility];  Service: Urology;  Laterality: Left;  . XI ROBOTIC ASSISTED VENTRAL HERNIA N/A 05/18/2019   Procedure: XI ROBOTIC ASSISTED UMBILICAL HERNIA;  Surgeon: [name],  Isami, DO;  Location: [facility];  Service: General;  Laterality: N/A;    Current Outpatient Medications  Medication Sig Dispense Refill  . amLODipine (NORVASC) 2.5 MG tablet Take 2.5 mg by mouth daily.     Marland Kitchen aspirin EC 81 MG tablet Take 81 mg by mouth daily.    . carvedilol (COREG) 3.125  MG tablet Take 3.125 mg by mouth 2 (two) times daily with a meal.    . EPINEPHrine 0.3 mg/0.3 mL IJ SOAJ injection Inject 0.3 mg into the muscle as needed for anaphylaxis.   3  . ezetimibe (ZETIA) 10 MG tablet Take 10 mg by mouth daily.    Marland Kitchen gabapentin (NEURONTIN) 300 MG capsule Take 1 capsule (300 mg total) by mouth at bedtime. 90 capsule 1  . hydrALAZINE (APRESOLINE) 100 MG tablet Take 100 mg by mouth 2 (two) times daily.     Marland Kitchen ibuprofen (ADVIL) 800 MG tablet Take 1 tablet (800 mg total) by mouth every 8 (eight) hours as needed for mild pain or moderate pain. 30 tablet 0  . losartan-hydrochlorothiazide (HYZAAR) 100-25 MG tablet Take 1 tablet by mouth daily.     . potassium chloride SA (K-DUR) 20 MEQ tablet Take 1 tablet (20 mEq total) by mouth daily. (Patient taking differently: Take 20 mEq by mouth 2 (two) times daily. ) 3 tablet 0  . pravastatin (PRAVACHOL) 40 MG tablet TAKE 1 TABLET BY MOUTH EVERY NIGHT    . traMADol (ULTRAM) 50 MG tablet Take 50 mg by mouth every 6 (six) hours as needed for moderate pain.    Marland Kitchen zolpidem (AMBIEN) 10 MG tablet Take 10 mg by mouth at bedtime.      No current facility-administered medications for this visit.     Allergies:   Patient has no known allergies.   Social History:  The patient  reports that he quit smoking about 2 years ago. His smoking use included cigarettes. He has a 25.00 pack-year smoking history. He has never used smokeless tobacco. He reports current alcohol use of about 3.0 standard drinks of alcohol per week. He reports that he does not use drugs.   Family History:   family history includes Aneurysm in his mother; Heart attack in his mother; Lung cancer in his father; Lymphoma in his sister; Pancreatic cancer in his mother.    Review of Systems: Review of Systems  Constitutional: Positive for malaise/fatigue.  Respiratory: Negative.   Cardiovascular: Positive for chest pain.  Gastrointestinal: Negative.   Musculoskeletal: Negative.    Neurological: Positive for weakness.  Psychiatric/Behavioral: Negative.   All other systems reviewed and are negative.    PHYSICAL EXAM: VS:  There were no vitals taken for this visit. , BMI There is no height or weight on file to calculate BMI. GEN: Well nourished, well developed, in no acute distress  HEENT: normal  Neck: no JVD, carotid bruits, or masses Cardiac: RRR; no murmurs, rubs, or gallops,no edema  Respiratory:  Moderately decreased breath sounds throughout,  normal work of breathing GI: soft, nontender, nondistended, + BS MS: no deformity or atrophy  Skin: warm and dry, no rash Neuro:  Strength and sensation are intact Psych: euthymic mood, full affect   Recent Labs: 08/21/2019: ALT 11; BUN 20; Creatinine, Ser 0.96; Hemoglobin 12.5; Platelets 186; Potassium 3.0; Sodium 138    Lipid Panel No results found for: CHOL, HDL, LDLCALC, TRIG    Wt Readings from Last 3 Encounters:  08/21/19 238 lb 11.2 oz (108.3 kg)  05/22/19 235  lb 1.6 oz (106.6 kg)  05/18/19 235 lb (106.6 kg)       ASSESSMENT AND PLAN:  Diffuse large B-cell lymphoma of extranodal site excluding spleen and other solid organs ([place]) Completed fourth round of RCHOP Reports he is hanging in there  Mixed hyperlipidemia Recommended he start Zetia with his pravastatin Goal LDL less than 70 given significant three-vessel coronary calcification on CT scan  Essential hypertension Blood pressure elevated even on recheck Recommended he buy blood pressure cuff and check blood pressure at home Call our office if numbers run high  Chest pain with moderate risk for cardiac etiology Will plan stress test as below after chemotherapy at his request  Smoker Stop smoking 3 months ago  Coronary artery disease of native artery of native heart with stable angina pectoris ([place]) Notes from oncology indicating some previous chest pain symptoms Long history of smoking Significant coronary disease on CT  scan Images reviewed with him in detail Recommended outpatient stress test after chemotherapy is completed unable to treadmill, would have to be pharmacological Myoview   Total encounter time more than 45 minutes  Greater than 50% was spent in counseling and coordination of care with the patient   Disposition:   F/U  12 mo  No orders of the defined types were placed in this encounter.    Signed, Esmond Plants, M.D., Ph.D. 09/24/2019  [place], [place]

## 2019-09-25 ENCOUNTER — Ambulatory Visit: Payer: Medicare HMO | Admitting: Cardiovascular Disease

## 2019-09-26 ENCOUNTER — Encounter: Payer: Self-pay | Admitting: Cardiovascular Disease

## 2019-10-11 ENCOUNTER — Ambulatory Visit (INDEPENDENT_AMBULATORY_CARE_PROVIDER_SITE_OTHER): Payer: Medicare HMO | Admitting: Cardiovascular Disease

## 2019-10-11 ENCOUNTER — Other Ambulatory Visit: Payer: Self-pay

## 2019-10-11 ENCOUNTER — Encounter: Payer: Self-pay | Admitting: Cardiovascular Disease

## 2019-10-11 VITALS — BP 160/100 | HR 71 | Ht 73.0 in | Wt 240.0 lb

## 2019-10-11 DIAGNOSIS — I25118 Atherosclerotic heart disease of native coronary artery with other forms of angina pectoris: Secondary | ICD-10-CM

## 2019-10-11 DIAGNOSIS — E782 Mixed hyperlipidemia: Secondary | ICD-10-CM | POA: Diagnosis not present

## 2019-10-11 DIAGNOSIS — C8518 Unspecified B-cell lymphoma, lymph nodes of multiple sites: Secondary | ICD-10-CM

## 2019-10-11 DIAGNOSIS — I10 Essential (primary) hypertension: Secondary | ICD-10-CM

## 2019-10-11 DIAGNOSIS — R079 Chest pain, unspecified: Secondary | ICD-10-CM

## 2019-10-11 MED ORDER — CARVEDILOL 12.5 MG PO TABS: 12.5000 mg | ORAL_TABLET | Freq: Two times a day (BID) | ORAL | 1 refills | Status: DC

## 2019-10-11 NOTE — Progress Notes (Signed)
Cardiology Office Note  Date:  10/11/2019   ID:  Zekiah Griesser Wordell, DOB 27-Jun-1946, MRN [id number]  PCP:  Tracie Harrier, MD   Chief Complaint  Patient presents with  . office visit    12 mo F/U; Meds verbally reviewed with patient.    HPI:  74 year old male with past medical history of Smoker, quit in 05/2017 HTN diffuse large B cell lymphoma (malaise summer 2018, blood in urine) CT scan 04/20/2017 which revealed moderate left hydronephrosis and delayed excretion down to a large 4.4 cmmass in or around the left proximal ureter. Long history of smoking Significant native three-vessel coronary disease on CT scan Presents for follow-up of his chest pain chest pain  History of lymphoma, has finished treatment No cancer recurrence Not smoking completed chemotherapy  No significant shortness of breath  No regular exercise program  BP elevated on initial check Usually 150/80-90 Does not check blood pressure at home Previously elevated in the office  Total chol 234, up from 184, LDL 153 up from 107  Has neuropathy  EKG personally reviewed by myself on todays visit Shows sinus rate of cardiac rate 71 bpm no significant ST or T-wave changes  Past medical history reviewed 06/2017: seen by Dr. Tasia Catchings, had chest pain,  Echo 06/07/2017 Normal EF 60%  previous imaging studies reviewed with him in detail  PET scan 05/18/2017  IMPRESSION: 1. Multifocal metastatic disease within the LEFT retroperitoneum, central mesenteries, and RIGHT paraspinal musculature.Two foci of skeletal metastasis  RCHOP chemotherapy, completed 4 rounds rounds, 2 more to go  PMH:   has a past medical history of Gout, Hard of hearing, Hiatal hernia, Hypertension, Non Hodgkin's lymphoma ([place]) (04/2017), and Urinary incontinence.  PSH:    Past Surgical History:  Procedure Laterality Date  . CYSTOSCOPY W/ RETROGRADES Left 08/19/2017   Procedure: CYSTOSCOPY WITH RETROGRADE PYELOGRAM;  Surgeon: Abbie Sons, MD;  Location: [facility];  Service: Urology;  Laterality: Left;  . CYSTOSCOPY W/ URETERAL STENT REMOVAL Left 08/19/2017   Procedure: CYSTOSCOPY WITH STENT REMOVAL;  Surgeon: Abbie Sons, MD;  Location: [facility];  Service: Urology;  Laterality: Left;  . CYSTOSCOPY WITH BIOPSY Left 05/13/2017   Procedure: CYSTOSCOPY WITH URETERAL BIOPSY;  Surgeon: Nickie Retort, MD;  Location: [facility];  Service: Urology;  Laterality: Left;  . CYSTOSCOPY WITH STENT PLACEMENT Left 05/13/2017   Procedure: CYSTOSCOPY WITH STENT PLACEMENT;  Surgeon: Nickie Retort, MD;  Location: [facility];  Service: Urology;  Laterality: Left;  . PORTA CATH INSERTION N/A 06/14/2017   Procedure: PORTA CATH INSERTION;  Surgeon: Algernon Huxley, MD;  Location: [facility];  Service: Cardiovascular;  Laterality: N/A;  . pyleostenosis     84 weeks old  . right knne surgery     needle went through knee  . TONSILLECTOMY    . URETEROSCOPY Left 05/13/2017   Procedure: URETEROSCOPY;  Surgeon: Nickie Retort, MD;  Location: [facility];  Service: Urology;  Laterality: Left;  . XI ROBOTIC ASSISTED VENTRAL HERNIA N/A 05/18/2019   Procedure: XI ROBOTIC ASSISTED UMBILICAL HERNIA;  Surgeon: Benjamine Sprague, DO;  Location: [facility];  Service: General;  Laterality: N/A;    Current Outpatient Medications  Medication Sig Dispense Refill  . amLODipine (NORVASC) 2.5 MG tablet Take 2.5 mg by mouth daily.     Marland Kitchen aspirin EC 81 MG tablet Take 81 mg by mouth daily.    . carvedilol (COREG) 3.125 MG tablet Take 3.125 mg by mouth 2 (two) times  daily with a meal.    . EPINEPHrine 0.3 mg/0.3 mL IJ SOAJ injection Inject 0.3 mg into the muscle as needed for anaphylaxis.   3  . gabapentin (NEURONTIN) 300 MG capsule Take 1 capsule (300 mg total) by mouth at bedtime. 90 capsule 1  . hydrALAZINE (APRESOLINE) 100 MG tablet Take 100 mg by mouth 2 (two) times daily.     Marland Kitchen ibuprofen (ADVIL) 800 MG tablet Take 1 tablet (800 mg total) by mouth every  8 (eight) hours as needed for mild pain or moderate pain. 30 tablet 0  . losartan-hydrochlorothiazide (HYZAAR) 100-25 MG tablet Take 1 tablet by mouth daily.     . potassium chloride SA (K-DUR) 20 MEQ tablet Take 1 tablet (20 mEq total) by mouth daily. (Patient taking differently: Take 20 mEq by mouth 2 (two) times daily. ) 3 tablet 0  . pravastatin (PRAVACHOL) 40 MG tablet TAKE 1 TABLET BY MOUTH EVERY NIGHT    . traMADol (ULTRAM) 50 MG tablet Take 50 mg by mouth every 6 (six) hours as needed for moderate pain.    Marland Kitchen zolpidem (AMBIEN) 10 MG tablet Take 10 mg by mouth at bedtime.     Marland Kitchen ezetimibe (ZETIA) 10 MG tablet Take 10 mg by mouth daily.     No current facility-administered medications for this visit.     Allergies:   Patient has no known allergies.   Social History:  The patient  reports that he quit smoking about 2 years ago. His smoking use included cigarettes. He has a 25.00 pack-year smoking history. He has never used smokeless tobacco. He reports current alcohol use of about 3.0 standard drinks of alcohol per week. He reports that he does not use drugs.   Family History:   family history includes Aneurysm in his mother; Heart attack in his mother; Lung cancer in his father; Lymphoma in his sister; Pancreatic cancer in his mother.    Review of Systems: Review of Systems  Constitutional: Positive for malaise/fatigue.  Respiratory: Negative.   Cardiovascular: Positive for chest pain.  Gastrointestinal: Negative.   Musculoskeletal: Negative.   Neurological: Positive for weakness.  Psychiatric/Behavioral: Negative.   All other systems reviewed and are negative.   PHYSICAL EXAM: VS:  BP (!) 180/110 (BP Location: Left Arm, Patient Position: Sitting, Cuff Size: Normal)   Pulse 71   Ht 6\' 1"  (1.854 m)   Wt 240 lb (108.9 kg)   SpO2 96%   BMI 31.66 kg/m  , BMI Body mass index is 31.66 kg/m. Constitutional:  oriented to person, place, and time. No distress.  HENT:  Head:  Grossly normal Eyes:  no discharge. No scleral icterus.  Neck: No JVD, no carotid bruits  Cardiovascular: Regular rate and rhythm, no murmurs appreciated Trace LE edema R>L Pulmonary/Chest: Clear to auscultation bilaterally, no wheezes or rails Abdominal: Soft.  no distension.  no tenderness.  Musculoskeletal: Normal range of motion Neurological:  normal muscle tone. Coordination normal. No atrophy Skin: Skin warm and dry Psychiatric: normal affect, pleasant   Recent Labs: 08/21/2019: ALT 11; BUN 20; Creatinine, Ser 0.96; Hemoglobin 12.5; Platelets 186; Potassium 3.0; Sodium 138    Lipid Panel No results found for: CHOL, HDL, LDLCALC, TRIG    Wt Readings from Last 3 Encounters:  10/11/19 240 lb (108.9 kg)  08/21/19 238 lb 11.2 oz (108.3 kg)  05/22/19 235 lb 1.6 oz (106.6 kg)       ASSESSMENT AND PLAN:  Diffuse large B-cell lymphoma of extranodal site excluding  spleen and other solid organs ([place]) Completed RCHOP No recurrence  Mixed hyperlipidemia Stressed compliance Pravastatin zetia  Essential hypertension Stop norvasc for leg swelling Increase coreg 12.5 bid  Chest pain with moderate risk for cardiac etiology no active chest pain  Smoker Stop smoking 3 years  Coronary artery disease of native artery of native heart with stable angina pectoris ([facility]) Currently with no symptoms of angina. No further workup at this time. Continue current medication regimen.    Total encounter time more than 25 minutes  Greater than 50% was spent in counseling and coordination of care with the patient   Disposition:   F/U  3 mo  No orders of the defined types were placed in this encounter.    Signed, Esmond Plants, M.D., Ph.D. 10/11/2019  [place], [place]

## 2019-10-11 NOTE — Patient Instructions (Addendum)
Medication Instructions:  Hold the amlodipine, this can cause leg swelling  Increase the coreg up to 3 of the 2.5 mg pills twice a day Then renew the coreg medication when you are out Coreg 12.5 mg twice a day   If you need a refill on your cardiac medications before your next appointment, please call your pharmacy.    Lab work: No new labs needed   If you have labs (blood work) drawn today and your tests are completely normal, you will receive your results only by: Marland Kitchen MyChart Message (if you have MyChart) OR . A paper copy in the mail If you have any lab test that is abnormal or we need to change your treatment, we will call you to review the results.   Testing/Procedures: No new testing needed   Follow-Up: At [facility], you and your health needs are our priority.  As part of our continuing mission to provide you with exceptional heart care, we have created designated Provider Care Teams.  These Care Teams include your primary Cardiologist (physician) and Advanced Practice Providers (APPs -  Physician Assistants and Nurse Practitioners) who all work together to provide you with the care you need, when you need it.  . You will need a follow up appointment in 3 months .  Marland Kitchen Providers on your designated Care Team:   . Murray Hodgkins, NP . Christell Faith, PA-C . Marrianne Mood, PA-C  Any Other Special Instructions Will Be Listed Below (If Applicable).  For educational health videos Log in to : [email or web address] Or : [email or web address], password : [email or web address]

## 2019-10-16 ENCOUNTER — Inpatient Hospital Stay: Payer: Medicare HMO | Attending: Oncology

## 2019-10-16 ENCOUNTER — Other Ambulatory Visit: Payer: Self-pay

## 2019-10-16 DIAGNOSIS — C8519 Unspecified B-cell lymphoma, extranodal and solid organ sites: Secondary | ICD-10-CM | POA: Insufficient documentation

## 2019-10-16 DIAGNOSIS — Z452 Encounter for adjustment and management of vascular access device: Secondary | ICD-10-CM | POA: Insufficient documentation

## 2019-10-16 DIAGNOSIS — Z95828 Presence of other vascular implants and grafts: Secondary | ICD-10-CM

## 2019-10-16 MED ORDER — SODIUM CHLORIDE 0.9% FLUSH
10.0000 mL | Freq: Once | INTRAVENOUS | Status: AC
  Administered 2019-10-16: 10 mL via INTRAVENOUS
  Filled 2019-10-16: qty 10

## 2019-10-16 MED ORDER — HEPARIN SOD (PORK) LOCK FLUSH 100 UNIT/ML IV SOLN
500.0000 [IU] | Freq: Once | INTRAVENOUS | Status: AC
  Administered 2019-10-16: 500 [IU] via INTRAVENOUS
  Filled 2019-10-16: qty 5

## 2019-11-05 DIAGNOSIS — E7800 Pure hypercholesterolemia, unspecified: Secondary | ICD-10-CM | POA: Diagnosis not present

## 2019-11-05 DIAGNOSIS — Z125 Encounter for screening for malignant neoplasm of prostate: Secondary | ICD-10-CM | POA: Diagnosis not present

## 2019-11-05 DIAGNOSIS — I10 Essential (primary) hypertension: Secondary | ICD-10-CM | POA: Diagnosis not present

## 2019-11-05 DIAGNOSIS — E876 Hypokalemia: Secondary | ICD-10-CM | POA: Diagnosis not present

## 2019-11-12 DIAGNOSIS — I10 Essential (primary) hypertension: Secondary | ICD-10-CM | POA: Diagnosis not present

## 2019-11-12 DIAGNOSIS — D649 Anemia, unspecified: Secondary | ICD-10-CM | POA: Diagnosis not present

## 2019-11-12 DIAGNOSIS — Z0000 Encounter for general adult medical examination without abnormal findings: Secondary | ICD-10-CM | POA: Diagnosis not present

## 2019-11-12 DIAGNOSIS — Z23 Encounter for immunization: Secondary | ICD-10-CM | POA: Diagnosis not present

## 2019-11-12 DIAGNOSIS — C8330 Diffuse large B-cell lymphoma, unspecified site: Secondary | ICD-10-CM | POA: Diagnosis not present

## 2019-11-12 DIAGNOSIS — G4700 Insomnia, unspecified: Secondary | ICD-10-CM | POA: Diagnosis not present

## 2019-11-12 DIAGNOSIS — E785 Hyperlipidemia, unspecified: Secondary | ICD-10-CM | POA: Diagnosis not present

## 2019-11-12 DIAGNOSIS — H9190 Unspecified hearing loss, unspecified ear: Secondary | ICD-10-CM | POA: Diagnosis not present

## 2019-11-12 DIAGNOSIS — R600 Localized edema: Secondary | ICD-10-CM | POA: Diagnosis not present

## 2019-11-20 ENCOUNTER — Other Ambulatory Visit: Payer: Self-pay

## 2019-11-20 ENCOUNTER — Ambulatory Visit (INDEPENDENT_AMBULATORY_CARE_PROVIDER_SITE_OTHER): Payer: Medicare HMO | Admitting: Vascular Surgery

## 2019-11-20 ENCOUNTER — Encounter (INDEPENDENT_AMBULATORY_CARE_PROVIDER_SITE_OTHER): Payer: Self-pay | Admitting: Vascular Surgery

## 2019-11-20 VITALS — BP 193/104 | HR 64 | Resp 16 | Ht 73.0 in | Wt 231.0 lb

## 2019-11-20 DIAGNOSIS — C8518 Unspecified B-cell lymphoma, lymph nodes of multiple sites: Secondary | ICD-10-CM

## 2019-11-20 DIAGNOSIS — E782 Mixed hyperlipidemia: Secondary | ICD-10-CM

## 2019-11-20 DIAGNOSIS — M7989 Other specified soft tissue disorders: Secondary | ICD-10-CM | POA: Insufficient documentation

## 2019-11-20 DIAGNOSIS — I10 Essential (primary) hypertension: Secondary | ICD-10-CM | POA: Diagnosis not present

## 2019-11-20 NOTE — Assessment & Plan Note (Signed)
Patient presents today with what appears to be stage 2 lymphedema.  We have discussed the pathophysiology and natural history of lymphedema. I have recommended 20-30 mmHg compression stockings, and we have prescribed these today. I have recommended increasing activity and excersize to try to improve the swelling. I have discussed the importance of leg elevation and asked the patient elevate her legs as much as tolerated. I have also ordered a venous duplex to assess for whether or not significant venous reflux may be a contributing factor. The patient will have this done at their convenience. I will see them back following the noninvasive study to discuss the results as well as to assess their response to conservative therapies. 

## 2019-11-20 NOTE — Patient Instructions (Signed)

## 2019-11-20 NOTE — Assessment & Plan Note (Signed)
lipid control important in reducing the progression of atherosclerotic disease. Continue statin therapy  

## 2019-11-20 NOTE — Progress Notes (Signed)
Patient ID: John Gallegos, male   DOB: 1945-09-14, 74 y.o.   MRN: [id number]  Chief Complaint  Patient presents with  . New Patient (Initial Visit)    ref. hande le edema     HPI John Gallegos is a 74 y.o. male.  I am asked to see the patient by Dr. Ginette Pitman for evaluation of lower extremity swelling.  This has been a problem for several months without a clear cause or inciting event.  He would get somewhere in the 8 to 58-month range.  He has a previous history of lymphoma and is status post treatment for that 2 to 3 years ago.  The swelling was gradual in its onset and has been gradually getting worse.  He denies any open wounds or ulcerations.  Both lower extremities are affected.  The legs are heavy and tired because of the swelling but it is not overtly painful.  He does not have any fevers or chills.  No history of DVT or superficial thrombophlebitis to his knowledge.     Past Medical History:  Diagnosis Date  . Gout   . Hard of hearing   . Hiatal hernia   . Hypertension   . 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 Urinary incontinence    frequency    Past Surgical History:  Procedure Laterality Date  . CYSTOSCOPY W/ RETROGRADES Left 08/19/2017   Procedure: CYSTOSCOPY WITH RETROGRADE PYELOGRAM;  Surgeon: Abbie Sons, MD;  Location: [facility];  Service: Urology;  Laterality: Left;  . CYSTOSCOPY W/ URETERAL STENT REMOVAL Left 08/19/2017   Procedure: CYSTOSCOPY WITH STENT REMOVAL;  Surgeon: Abbie Sons, MD;  Location: [facility];  Service: Urology;  Laterality: Left;  . CYSTOSCOPY WITH BIOPSY Left 05/13/2017   Procedure: CYSTOSCOPY WITH URETERAL BIOPSY;  Surgeon: Nickie Retort, MD;  Location: [facility];  Service: Urology;  Laterality: Left;  . CYSTOSCOPY WITH STENT PLACEMENT Left 05/13/2017   Procedure: CYSTOSCOPY WITH STENT PLACEMENT;  Surgeon: Nickie Retort, MD;  Location: [facility];  Service: Urology;   Laterality: Left;  . PORTA CATH INSERTION N/A 06/14/2017   Procedure: PORTA CATH INSERTION;  Surgeon: Algernon Huxley, MD;  Location: [facility];  Service: Cardiovascular;  Laterality: N/A;  . 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 right knne surgery     needle went through knee  . TONSILLECTOMY    . URETEROSCOPY Left 05/13/2017   Procedure: URETEROSCOPY;  Surgeon: Nickie Retort, MD;  Location: [facility];  Service: Urology;  Laterality: Left;  . XI ROBOTIC ASSISTED VENTRAL HERNIA N/A 05/18/2019   Procedure: XI ROBOTIC ASSISTED UMBILICAL HERNIA;  Surgeon: Benjamine Sprague, DO;  Location: [facility];  Service: General;  Laterality: N/A;     Family History  Problem Relation Age of Onset  . Pancreatic cancer Mother   . Aneurysm Mother   . Heart attack Mother   . Lung cancer Father   . Lymphoma Sister   . Prostate cancer Neg Hx   . Kidney cancer Neg Hx   . Bladder Cancer Neg Hx       Social History   Tobacco Use  . Smoking status: Former Smoker    Packs/day: 0.50    Years: 50.00    Pack years: 25.00    Types: Cigarettes    Quit date: 12/12/2016    Years since quitting: 2.9  . Smokeless tobacco:  Never Used  Substance Use Topics  . Alcohol use: Yes    Alcohol/week: 3.0 standard drinks    Types: 3 Shots of liquor per week  . Drug use: No     No Known Allergies  Current Outpatient Medications  Medication Sig Dispense Refill  . aspirin EC 81 MG tablet Take 81 mg by mouth daily.    . carvedilol (COREG) 12.5 MG tablet Take 1 tablet (12.5 mg total) by mouth 2 (two) times daily with a meal. 180 tablet 1  . EPINEPHrine 0.3 mg/0.3 mL IJ SOAJ injection Inject 0.3 mg into the muscle as needed for anaphylaxis.   3  . gabapentin (NEURONTIN) 300 MG capsule Take 1 capsule (300 mg total) by mouth at bedtime. 90 capsule 1  . hydrALAZINE (APRESOLINE) 100 MG tablet Take 100 mg by mouth 2 (two) times daily.     Marland Kitchen ibuprofen (ADVIL) 800 MG tablet Take 1 tablet (800 mg total) by mouth every 8  (eight) hours as needed for mild pain or moderate pain. 30 tablet 0  . losartan-hydrochlorothiazide (HYZAAR) 100-25 MG tablet Take 1 tablet by mouth daily.     . potassium chloride SA (K-DUR) 20 MEQ tablet Take 1 tablet (20 mEq total) by mouth daily. (Patient taking differently: Take 20 mEq by mouth 2 (two) times daily. ) 3 tablet 0  . pravastatin (PRAVACHOL) 40 MG tablet TAKE 1 TABLET BY MOUTH EVERY NIGHT    . traMADol (ULTRAM) 50 MG tablet Take 50 mg by mouth every 6 (six) hours as needed for moderate pain.    Marland Kitchen zolpidem (AMBIEN) 10 MG tablet Take 10 mg by mouth at bedtime.     Marland Kitchen ezetimibe (ZETIA) 10 MG tablet Take 10 mg by mouth daily.     No current facility-administered medications for this visit.      REVIEW OF SYSTEMS (Negative unless checked)  Constitutional: [] Weight loss  [] Fever  [] Chills Cardiac: [] Chest pain   [] Chest pressure   [] Palpitations   [] Shortness of breath when laying flat   [] Shortness of breath at rest   [] Shortness of breath with exertion. Vascular:  [] Pain in legs with walking   [] Pain in legs at rest   [] Pain in legs when laying flat   [] Claudication   [] Pain in feet when walking  [] Pain in feet at rest  [] Pain in feet when laying flat   [] History of DVT   [] Phlebitis   [x] Swelling in legs   [] Varicose veins   [] Non-healing ulcers Pulmonary:   [] Uses home oxygen   [] Productive cough   [] Hemoptysis   [] Wheeze  [] COPD   [] Asthma Neurologic:  [] Dizziness  [] Blackouts   [] Seizures   [] History of stroke   [] History of TIA  [] Aphasia   [] Temporary blindness   [] Dysphagia   [] Weakness or numbness in arms   [] Weakness or numbness in legs Musculoskeletal:  [x] Arthritis   [] Joint swelling   [x] Joint pain   [] Low back pain Hematologic:  [] Easy bruising  [] Easy bleeding   [] Hypercoagulable state   [] Anemic  [] Hepatitis Gastrointestinal:  [] Blood in stool   [] Vomiting blood  [] Gastroesophageal reflux/heartburn   [] Abdominal pain Genitourinary:  [] Chronic kidney disease    [] Difficult urination  [] Frequent urination  [] Burning with urination   [] Hematuria Skin:  [] Rashes   [] Ulcers   [] Wounds Psychological:  [] History of anxiety   []  History of major depression.    Physical Exam BP (!) 193/104 (BP Location: Left Arm)   Pulse 64   Resp 16  Ht 6\' 1"  (1.854 m)   Wt 231 lb (104.8 kg)   BMI 30.48 kg/m  Gen:  WD/WN, NAD Head: [place]/AT, No temporalis wasting.  Ear/Nose/Throat: Hearing grossly intact, nares w/o erythema or drainage, oropharynx w/o Erythema/Exudate Eyes: Conjunctiva clear, sclera non-icteric  Neck: trachea midline.  No JVD.  Pulmonary:  Good air movement, respirations not labored, no use of accessory muscles  Cardiac: RRR, no JVD Vascular:  Vessel Right Left  Radial Palpable Palpable                          DP 1+ 1+  PT trace trace   Gastrointestinal:. No masses, surgical incisions, or scars. Musculoskeletal: M/S 5/5 throughout.  Extremities without ischemic changes.  No deformity or atrophy. 1-2+ BLE edema. Neurologic: Sensation grossly intact in extremities.  Symmetrical.  Speech is fluent. Motor exam as listed above. Psychiatric: Judgment intact, Mood & affect appropriate for pt's clinical situation. Dermatologic: No rashes or ulcers noted.  No cellulitis or open wounds.    Radiology No results found.  Labs No results found for this or any previous visit (from the past 2160 hour(s)).  Assessment/Plan:  Hypertension blood pressure control important in reducing the progression of atherosclerotic disease. On appropriate oral medications.   Hyperlipidemia, unspecified lipid control important in reducing the progression of atherosclerotic disease. 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  We have discussed the pathophysiology and  natural history of lymphedema. I have recommended 20-30 mmHg compression stockings, and we have prescribed these today. I have recommended increasing activity and excersize to try to improve the swelling. I have discussed the importance of leg elevation and asked the patient elevate her legs as much as tolerated. I have also ordered a venous duplex to assess for whether or not significant venous reflux may be a contributing factor. The patient will have this done at their convenience. I will see them back following the noninvasive study to discuss the results as well as to assess their response to conservative therapies.      Leotis Pain 11/20/2019, 2:22 PM   This note was created with Dragon medical transcription system.  Any errors from dictation are unintentional.

## 2019-11-20 NOTE — Assessment & Plan Note (Signed)
This was probably a cause of lymphedema of the lower extremities

## 2019-11-20 NOTE — Assessment & Plan Note (Signed)
blood pressure control important in reducing the progression of atherosclerotic disease. On appropriate oral medications.  

## 2019-11-29 ENCOUNTER — Encounter (INDEPENDENT_AMBULATORY_CARE_PROVIDER_SITE_OTHER): Payer: Self-pay

## 2019-12-03 ENCOUNTER — Other Ambulatory Visit: Payer: Self-pay

## 2019-12-03 ENCOUNTER — Ambulatory Visit (INDEPENDENT_AMBULATORY_CARE_PROVIDER_SITE_OTHER): Payer: Medicare HMO | Admitting: Nurse Practitioner

## 2019-12-03 ENCOUNTER — Ambulatory Visit (INDEPENDENT_AMBULATORY_CARE_PROVIDER_SITE_OTHER): Payer: Medicare HMO

## 2019-12-03 ENCOUNTER — Encounter (INDEPENDENT_AMBULATORY_CARE_PROVIDER_SITE_OTHER): Payer: Self-pay | Admitting: Nurse Practitioner

## 2019-12-03 VITALS — BP 182/94 | HR 65 | Resp 16 | Wt 232.4 lb

## 2019-12-03 DIAGNOSIS — C8339 Diffuse large B-cell lymphoma, extranodal and solid organ sites: Secondary | ICD-10-CM | POA: Diagnosis not present

## 2019-12-03 DIAGNOSIS — I890 Lymphedema, not elsewhere classified: Secondary | ICD-10-CM | POA: Diagnosis not present

## 2019-12-03 DIAGNOSIS — I10 Essential (primary) hypertension: Secondary | ICD-10-CM

## 2019-12-03 DIAGNOSIS — M7989 Other specified soft tissue disorders: Secondary | ICD-10-CM

## 2019-12-06 ENCOUNTER — Encounter (INDEPENDENT_AMBULATORY_CARE_PROVIDER_SITE_OTHER): Payer: Self-pay | Admitting: Nurse Practitioner

## 2019-12-06 NOTE — Progress Notes (Signed)
Subjective:    Patient ID: John Gallegos, male    DOB: 07-Oct-1945, 74 y.o.   MRN: [id number] Chief Complaint  Patient presents with  . Follow-up    ultrasound follow up    The patient returns to the office for followup evaluation regarding leg swelling.  The swelling has persisted and the pain associated with swelling continues. There have not been any interval development of a ulcerations or wounds.  Since the previous visit the patient has been wearing graduated compression stockings and has noted little if any improvement in the lymphedema. The patient has been using compression routinely morning until night.  The patient also states elevation during the day and exercise is being done too.   The patient has had a previous history of lymphoma which may also be contributing to the   Review of Systems  Cardiovascular: Positive for leg swelling.  Skin: Positive for rash.  All other systems reviewed and are negative.      Objective:   Physical Exam Vitals reviewed.  Constitutional:      Appearance: Normal appearance.  Cardiovascular:     Rate and Rhythm: Normal rate and regular rhythm.     Pulses: Normal pulses.     Heart sounds: Normal heart sounds.  Musculoskeletal:     Right lower leg: 2+ Edema present.     Left lower leg: 2+ Edema present.  Skin:    Comments: Dermal thickening bilaterally  Neurological:     Mental Status: He is alert.     BP (!) 182/94 (BP Location: Left Arm)   Pulse 65   Resp 16   Wt 232 lb 6.4 oz (105.4 kg)   BMI 30.66 kg/m   Past Medical History:  Diagnosis Date  . Gout   . Hard of hearing   . Hiatal hernia   . Hypertension   . 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 Urinary incontinence    frequency    Social History   Socioeconomic History  . Marital status: Married    Spouse name: Not on file  . Number of children: Not on file  . Years of education: Not on file  .  Highest education level: Not on file  Occupational History  . Not on file  Tobacco Use  . Smoking status: Former Smoker    Packs/day: 0.50    Years: 50.00    Pack years: 25.00    Types: Cigarettes    Quit date: 12/12/2016    Years since quitting: 2.9  . Smokeless tobacco: Never Used  Substance and Sexual Activity  . Alcohol use: Yes    Alcohol/week: 3.0 standard drinks    Types: 3 Shots of liquor per week  . Drug use: No  . Sexual activity: Not on file  Other Topics Concern  . Not on file  Social History Narrative  . Not on file   Social Determinants of Health   Financial Resource Strain:   . Difficulty of Paying Living Expenses:   Food Insecurity:   . Worried About Charity fundraiser in the Last Year:   . Arboriculturist in the Last Year:   Transportation Needs:   . Film/video editor (Medical):   Marland Kitchen Lack of Transportation (Non-Medical):   Physical Activity:   . Days of Exercise per Week:   . Minutes of Exercise per Session:   Stress:   . Feeling of Stress :  Social Connections:   . Frequency of Communication with Friends and Family:   . Frequency of Social Gatherings with Friends and Family:   . Attends Religious Services:   . Active Member of Clubs or Organizations:   . Attends Archivist Meetings:   Marland Kitchen Marital Status:   Intimate Partner Violence:   . Fear of Current or Ex-Partner:   . Emotionally Abused:   Marland Kitchen Physically Abused:   . Sexually Abused:     Past Surgical History:  Procedure Laterality Date  . CYSTOSCOPY W/ RETROGRADES Left 08/19/2017   Procedure: CYSTOSCOPY WITH RETROGRADE PYELOGRAM;  Surgeon: Abbie Sons, MD;  Location: [facility];  Service: Urology;  Laterality: Left;  . CYSTOSCOPY W/ URETERAL STENT REMOVAL Left 08/19/2017   Procedure: CYSTOSCOPY WITH STENT REMOVAL;  Surgeon: Abbie Sons, MD;  Location: [facility];  Service: Urology;  Laterality: Left;  . CYSTOSCOPY WITH BIOPSY Left 05/13/2017   Procedure: CYSTOSCOPY WITH  URETERAL BIOPSY;  Surgeon: Nickie Retort, MD;  Location: [facility];  Service: Urology;  Laterality: Left;  . CYSTOSCOPY WITH STENT PLACEMENT Left 05/13/2017   Procedure: CYSTOSCOPY WITH STENT PLACEMENT;  Surgeon: Nickie Retort, MD;  Location: [facility];  Service: Urology;  Laterality: Left;  . PORTA CATH INSERTION N/A 06/14/2017   Procedure: PORTA CATH INSERTION;  Surgeon: Algernon Huxley, MD;  Location: [place] [facility];  Service: Cardiovascular;  Laterality: N/A;  . pyleostenosis     15 weeks old  . right knne surgery     needle went through knee  . TONSILLECTOMY    . URETEROSCOPY Left 05/13/2017   Procedure: URETEROSCOPY;  Surgeon: Nickie Retort, MD;  Location: [facility];  Service: Urology;  Laterality: Left;  . XI ROBOTIC ASSISTED VENTRAL HERNIA N/A 05/18/2019   Procedure: XI ROBOTIC ASSISTED UMBILICAL HERNIA;  Surgeon: Benjamine Sprague, DO;  Location: [facility];  Service: General;  Laterality: N/A;    Family History  Problem Relation Age of Onset  . Pancreatic cancer Mother   . Aneurysm Mother   . Heart attack Mother   . Lung cancer Father   . Lymphoma Sister   . Prostate cancer Neg Hx   . Kidney cancer Neg Hx   . Bladder Cancer Neg Hx     No Known Allergies     Assessment & Plan:   1. Lymphedema Recommend:  No surgery or intervention at this point in time.    I have reviewed my previous discussion with the patient regarding swelling and why it causes symptoms.  Patient will continue wearing graduated compression stockings class 1 (20-30 mmHg) on a daily basis. The patient will  beginning wearing the stockings first thing in the morning and removing them in the evening. The patient is instructed specifically not to sleep in the stockings.    In addition, behavioral modification including several periods of elevation of the lower extremities during the day will be continued.  This was reviewed with the patient during the initial visit.  The patient will also  continue routine exercise, especially walking on a daily basis as was discussed during the initial visit.    Despite conservative treatments including graduated compression therapy class 1 and behavioral modification including exercise and elevation the patient  has not obtained adequate control of the lymphedema.  The patient still has stage 3 lymphedema and therefore, I believe that a lymph pump should be added to improve the control of the patient's lymphedema.  Additionally, a lymph pump  is warranted because it will reduce the risk of cellulitis and ulceration in the future.  Patient should follow-up in six months    2. Essential hypertension Continue antihypertensive medications as already ordered, these medications have been reviewed and there are no changes at this time.   3. 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  We will continue to follow by oncology and PCP for management   Current Outpatient Medications on File Prior to Visit  Medication Sig Dispense Refill  . aspirin EC 81 MG tablet Take 81 mg by mouth daily.    . carvedilol (COREG) 12.5 MG tablet Take 1 tablet (12.5 mg total) by mouth 2 (two) times daily with a meal. 180 tablet 1  . EPINEPHrine 0.3 mg/0.3 mL IJ SOAJ injection Inject 0.3 mg into the muscle as needed for anaphylaxis.   3  . ezetimibe (ZETIA) 10 MG tablet Take 10 mg by mouth daily.    Marland Kitchen gabapentin (NEURONTIN) 300 MG capsule Take 1 capsule (300 mg total) by mouth at bedtime. 90 capsule 1  . hydrALAZINE (APRESOLINE) 100 MG tablet Take 100 mg by mouth 2 (two) times daily.     Marland Kitchen ibuprofen (ADVIL) 800 MG tablet Take 1 tablet (800 mg total) by mouth every 8 (eight) hours as needed for mild pain or moderate pain. 30 tablet 0  . losartan-hydrochlorothiazide (HYZAAR) 100-25 MG tablet Take 1 tablet by mouth daily.     . potassium chloride SA (K-DUR) 20 MEQ tablet Take 1  tablet (20 mEq total) by mouth daily. (Patient taking differently: Take 20 mEq by mouth 2 (two) times daily. ) 3 tablet 0  . pravastatin (PRAVACHOL) 40 MG tablet TAKE 1 TABLET BY MOUTH EVERY NIGHT    . traMADol (ULTRAM) 50 MG tablet Take 50 mg by mouth every 6 (six) hours as needed for moderate pain.    Marland Kitchen zolpidem (AMBIEN) 10 MG tablet Take 10 mg by mouth at bedtime.      No current facility-administered medications on file prior to visit.    There are no Patient Instructions on file for this visit. No follow-ups on file.   Kris Hartmann, NP

## 2019-12-11 ENCOUNTER — Other Ambulatory Visit: Payer: Self-pay

## 2019-12-11 ENCOUNTER — Inpatient Hospital Stay: Payer: Medicare HMO | Attending: Oncology

## 2019-12-11 DIAGNOSIS — C8339 Diffuse large B-cell lymphoma, extranodal and solid organ sites: Secondary | ICD-10-CM | POA: Diagnosis not present

## 2019-12-11 DIAGNOSIS — Z452 Encounter for adjustment and management of vascular access device: Secondary | ICD-10-CM | POA: Diagnosis not present

## 2019-12-11 DIAGNOSIS — Z95828 Presence of other vascular implants and grafts: Secondary | ICD-10-CM

## 2019-12-11 MED ORDER — HEPARIN SOD (PORK) LOCK FLUSH 100 UNIT/ML IV SOLN
INTRAVENOUS | Status: AC
  Filled 2019-12-11: qty 5

## 2019-12-11 MED ORDER — HEPARIN SOD (PORK) LOCK FLUSH 100 UNIT/ML IV SOLN
500.0000 [IU] | Freq: Once | INTRAVENOUS | Status: AC
  Administered 2019-12-11: 500 [IU] via INTRAVENOUS
  Filled 2019-12-11: qty 5

## 2019-12-11 MED ORDER — SODIUM CHLORIDE 0.9% FLUSH
10.0000 mL | Freq: Once | INTRAVENOUS | Status: AC
  Administered 2019-12-11: 10 mL via INTRAVENOUS
  Filled 2019-12-11: qty 10

## 2020-01-14 NOTE — Progress Notes (Signed)
Cardiology Office Note  Date:  01/16/2020   ID:  John Gallegos, DOB [birth year], MRN [id number]  PCP:  Tracie Harrier, MD   Chief Complaint  Patient presents with  . Other    3 month follow up. patient c.o SOB. Meds reviewed verbally with patient.     HPI:  74 year old male with past medical history of Smoker, quit in 05/2017 HTN diffuse large B cell lymphoma (malaise summer 2018, blood in urine) CT scan 04/20/2017 which revealed moderate left hydronephrosis and delayed excretion down to a large 4.4 cmmass in or around the left proximal ureter. Significant native three-vessel coronary disease on CT scan Presents for follow-up of his chest pain  Last seen in clinic March 2021 For leg swelling amlodipine was held and carvedilol was increased Seen by vascular for lymphedema Blood pressure running high on several visits with vascular and primary care  Medication list through primary care does not match her medication list  Swelling resolved off norvasc  Tolerating pravastatin and zetia Total chol 172, LDL 112, down 153  changed his  Diet Leg hurt from neuropathy  BP stable Chronic SOB, COPD  EKG personally reviewed by myself on todays visit NSr rate 56 bpm nonspecific T wave ABn   History of lymphoma, has finished treatment No cancer recurrence Not smoking completed chemotherapy  No significant shortness of breath  No regular exercise program  BP elevated on initial check Usually 150/80-90 Does not check blood pressure at home Previously elevated in the office  Total chol 234, up from 184, LDL 153 up from 107  Has neuropathy  EKG personally reviewed by myself on todays visit Shows sinus rate of cardiac rate 71 bpm no significant ST or T-wave changes  Past medical history reviewed 06/2017: seen by Dr. Tasia Catchings, had chest pain,  Echo 06/07/2017 Normal EF 60%  previous imaging studies reviewed with him in detail  PET scan 05/18/2017  IMPRESSION: 1.  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  PSH:    Past Surgical History:  Procedure Laterality Date  . CYSTOSCOPY W/ RETROGRADES Left 08/19/2017   Procedure: CYSTOSCOPY WITH RETROGRADE PYELOGRAM;  Surgeon: Abbie Sons, MD;  Location: [facility];  Service: Urology;  Laterality: Left;  . CYSTOSCOPY W/ URETERAL STENT REMOVAL Left 08/19/2017   Procedure: CYSTOSCOPY WITH STENT REMOVAL;  Surgeon: Abbie Sons, MD;  Location: [facility];  Service: Urology;  Laterality: Left;  . CYSTOSCOPY WITH BIOPSY Left 05/13/2017   Procedure: CYSTOSCOPY WITH URETERAL BIOPSY;  Surgeon: Nickie Retort, MD;  Location: [facility];  Service: Urology;  Laterality: Left;  . CYSTOSCOPY WITH STENT PLACEMENT Left 05/13/2017   Procedure: CYSTOSCOPY WITH STENT PLACEMENT;  Surgeon: Nickie Retort, MD;  Location: [facility];  Service: Urology;  Laterality: Left;  . PORTA CATH INSERTION N/A 06/14/2017   Procedure: PORTA CATH INSERTION;  Surgeon: Algernon Huxley, MD;  Location: [facility];  Service: Cardiovascular;  Laterality: N/A;  . 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 right knne surgery     needle went through knee  . TONSILLECTOMY    . URETEROSCOPY Left 05/13/2017   Procedure: URETEROSCOPY;  Surgeon: Nickie Retort, MD;  Location: [facility];  Service: Urology;  Laterality: Left;  . XI ROBOTIC ASSISTED VENTRAL HERNIA N/A 05/18/2019   Procedure: XI ROBOTIC ASSISTED UMBILICAL HERNIA;  Surgeon: Benjamine Sprague, DO;  Location: [facility];  Service: General;  Laterality: N/A;    Current Outpatient Medications  Medication Sig Dispense Refill  . aspirin EC 81 MG tablet Take 81 mg by mouth daily.     . carvedilol (COREG) 12.5 MG tablet Take 1 tablet (12.5 mg total) by mouth 2 (two) times daily with a meal. 180 tablet 1  . EPINEPHrine 0.3 mg/0.3 mL IJ SOAJ injection Inject 0.3 mg into the muscle as needed for anaphylaxis.   3  . ezetimibe (ZETIA) 10 MG tablet Take 10 mg by mouth daily.    Marland Kitchen gabapentin (NEURONTIN) 300 MG capsule Take 1 capsule (300 mg total) by mouth at bedtime. 90 capsule 1  . hydrALAZINE (APRESOLINE) 100 MG tablet Take 100 mg by mouth 2 (two) times daily.     Marland Kitchen ibuprofen (ADVIL) 800 MG tablet Take 1 tablet (800 mg total) by mouth every 8 (eight) hours as needed for mild pain or moderate pain. 30 tablet 0  . losartan-hydrochlorothiazide (HYZAAR) 100-25 MG tablet Take 1 tablet by mouth daily.     . potassium chloride (KLOR-CON) 20 MEQ packet Take one tablet by mouth every Monday, Wednesday, Friday.    . pravastatin (PRAVACHOL) 40 MG tablet TAKE 1 TABLET BY MOUTH EVERY NIGHT    . traMADol (ULTRAM) 50 MG tablet Take 50 mg by mouth every 6 (six) hours as needed for moderate pain.    Marland Kitchen zolpidem (AMBIEN) 10 MG tablet Take 10 mg by mouth at bedtime.      No current facility-administered medications for this visit.     Allergies:   Patient has no known allergies.   Social History:  The patient  reports that he quit smoking about 3 years ago. His smoking use included cigarettes. He has a 25.00 pack-year smoking history. He has never used smokeless tobacco. He reports current alcohol use of about 3.0 standard drinks of alcohol per week. He reports that he does not use drugs.   Family History:   family history includes Aneurysm in his mother; Heart attack in his mother; Lung cancer in his father; Lymphoma in his sister; Pancreatic cancer in his mother.    Review of Systems: Review of Systems  Constitutional: Positive for malaise/fatigue.  Respiratory: Negative.   Cardiovascular: Positive for chest pain.  Gastrointestinal: Negative.   Musculoskeletal: Negative.   Neurological:  Positive for weakness.  Psychiatric/Behavioral: Negative.   All other systems reviewed and are negative.   PHYSICAL EXAM: VS:  BP 140/82 (BP Location: Left Arm, Patient Position: Sitting, Cuff Size: Normal)   Pulse (!) 56   Ht 6\' 1"  (1.854 m)   Wt 225 lb (102.1 kg)   SpO2 95%   BMI 29.69 kg/m  , BMI Body mass index is 29.69 kg/m. Constitutional:  oriented to person, place, and time. No distress.  HENT:  Head: Grossly normal Eyes:  no discharge. No scleral icterus.  Neck: No JVD, no carotid bruits  Cardiovascular: Regular rate and rhythm, no murmurs appreciated Pulmonary/Chest: Clear to auscultation bilaterally, no wheezes or rails Abdominal: Soft.  no distension.  no tenderness.  Musculoskeletal: Normal range of motion Neurological:  normal muscle tone. Coordination normal. No atrophy Skin: Skin warm and dry Psychiatric: normal affect, pleasant   Recent Labs: 08/21/2019: ALT 11; BUN 20; Creatinine, Ser 0.96; Hemoglobin 12.5; Platelets 186; Potassium 3.0; Sodium  138    Lipid Panel No results found for: CHOL, HDL, LDLCALC, TRIG    Wt Readings from Last 3 Encounters:  01/16/20 225 lb (102.1 kg)  12/03/19 232 lb 6.4 oz (105.4 kg)  11/20/19 231 lb (104.8 kg)       ASSESSMENT AND PLAN:  Diffuse large B-cell lymphoma of extranodal site excluding spleen and other solid organs (HCC) Completed RCHOP No recurrence, still has port ij place  Mixed hyperlipidemia Stressed compliance Pravastatin zetia  Essential hypertension  norvasc held for leg swelling Stay on coreg 12.5 bid No changes  Chest pain with moderate risk for cardiac etiology Currently with no symptoms of angina. No further workup at this time. Continue current medication regimen. Discussed stress testing for worsening SOB  Smoker Stop smoking >3 years  COpd Not in inhaler   Total encounter time more than 25 minutes  Greater than 50% was spent in counseling and coordination of care with the  patient   Disposition:   F/U  12 mo  No orders of the defined types were placed in this encounter.    Signed, Esmond Plants, M.D., Ph.D. 01/16/2020  [place], [place]

## 2020-01-16 ENCOUNTER — Ambulatory Visit: Payer: Medicare HMO | Admitting: Cardiovascular Disease

## 2020-01-16 ENCOUNTER — Encounter: Payer: Self-pay | Admitting: Cardiovascular Disease

## 2020-01-16 ENCOUNTER — Other Ambulatory Visit: Payer: Self-pay

## 2020-01-16 VITALS — BP 140/82 | HR 56 | Ht 73.0 in | Wt 225.0 lb

## 2020-01-16 DIAGNOSIS — I25118 Atherosclerotic heart disease of native coronary artery with other forms of angina pectoris: Secondary | ICD-10-CM

## 2020-01-16 DIAGNOSIS — E782 Mixed hyperlipidemia: Secondary | ICD-10-CM

## 2020-01-16 DIAGNOSIS — Z87891 Personal history of nicotine dependence: Secondary | ICD-10-CM | POA: Diagnosis not present

## 2020-01-16 DIAGNOSIS — I10 Essential (primary) hypertension: Secondary | ICD-10-CM

## 2020-01-16 DIAGNOSIS — R079 Chest pain, unspecified: Secondary | ICD-10-CM

## 2020-01-16 NOTE — Patient Instructions (Addendum)

## 2020-02-05 ENCOUNTER — Inpatient Hospital Stay: Payer: Medicare HMO | Attending: Oncology

## 2020-02-05 ENCOUNTER — Other Ambulatory Visit: Payer: Self-pay

## 2020-02-05 DIAGNOSIS — Z452 Encounter for adjustment and management of vascular access device: Secondary | ICD-10-CM | POA: Diagnosis not present

## 2020-02-05 DIAGNOSIS — C8118 Nodular sclerosis classical Hodgkin lymphoma, lymph nodes of multiple sites: Secondary | ICD-10-CM | POA: Diagnosis not present

## 2020-02-05 DIAGNOSIS — Z95828 Presence of other vascular implants and grafts: Secondary | ICD-10-CM

## 2020-02-05 MED ORDER — SODIUM CHLORIDE 0.9% FLUSH
10.0000 mL | Freq: Once | INTRAVENOUS | Status: AC
  Administered 2020-02-05: 10 mL via INTRAVENOUS
  Filled 2020-02-05: qty 10

## 2020-02-05 MED ORDER — HEPARIN SOD (PORK) LOCK FLUSH 100 UNIT/ML IV SOLN
500.0000 [IU] | Freq: Once | INTRAVENOUS | Status: AC
  Administered 2020-02-05: 500 [IU] via INTRAVENOUS
  Filled 2020-02-05: qty 5

## 2020-02-05 MED ORDER — HEPARIN SOD (PORK) LOCK FLUSH 100 UNIT/ML IV SOLN
INTRAVENOUS | Status: AC
  Filled 2020-02-05: qty 5

## 2020-02-18 ENCOUNTER — Inpatient Hospital Stay (HOSPITAL_BASED_OUTPATIENT_CLINIC_OR_DEPARTMENT_OTHER): Payer: Medicare HMO | Admitting: Oncology

## 2020-02-18 ENCOUNTER — Other Ambulatory Visit: Payer: Self-pay

## 2020-02-18 ENCOUNTER — Inpatient Hospital Stay: Payer: Medicare HMO | Attending: Oncology

## 2020-02-18 ENCOUNTER — Encounter: Payer: Self-pay | Admitting: Oncology

## 2020-02-18 VITALS — BP 145/75 | HR 54 | Temp 96.6°F | Resp 18 | Wt 231.3 lb

## 2020-02-18 DIAGNOSIS — Z95828 Presence of other vascular implants and grafts: Secondary | ICD-10-CM

## 2020-02-18 DIAGNOSIS — C83398 Diffuse large b-cell lymphoma of other extranodal and solid organ sites: Secondary | ICD-10-CM

## 2020-02-18 DIAGNOSIS — C8339 Diffuse large B-cell lymphoma, extranodal and solid organ sites: Secondary | ICD-10-CM

## 2020-02-18 LAB — CBC WITH DIFFERENTIAL/PLATELET
Abs Immature Granulocytes: 0.04 10*3/uL (ref 0.00–0.07)
Basophils Absolute: 0 10*3/uL (ref 0.0–0.1)
Basophils Relative: 1 %
Eosinophils Absolute: 0.2 10*3/uL (ref 0.0–0.5)
Eosinophils Relative: 3 %
HCT: 38.2 % — ABNORMAL LOW (ref 39.0–52.0)
Hemoglobin: 13.2 g/dL (ref 13.0–17.0)
Immature Granulocytes: 1 %
Lymphocytes Relative: 9 %
Lymphs Abs: 0.6 10*3/uL — ABNORMAL LOW (ref 0.7–4.0)
MCH: 29.3 pg (ref 26.0–34.0)
MCHC: 34.6 g/dL (ref 30.0–36.0)
MCV: 84.9 fL (ref 80.0–100.0)
Monocytes Absolute: 0.6 10*3/uL (ref 0.1–1.0)
Monocytes Relative: 9 %
Neutro Abs: 4.8 10*3/uL (ref 1.7–7.7)
Neutrophils Relative %: 77 %
Platelets: 168 10*3/uL (ref 150–400)
RBC: 4.5 MIL/uL (ref 4.22–5.81)
RDW: 13.6 % (ref 11.5–15.5)
WBC: 6.3 10*3/uL (ref 4.0–10.5)
nRBC: 0 % (ref 0.0–0.2)

## 2020-02-18 LAB — URINALYSIS, COMPLETE (UACMP) WITH MICROSCOPIC
Bacteria, UA: NONE SEEN
Bilirubin Urine: NEGATIVE
Glucose, UA: NEGATIVE mg/dL
Ketones, ur: NEGATIVE mg/dL
Leukocytes,Ua: NEGATIVE
Nitrite: NEGATIVE
Protein, ur: NEGATIVE mg/dL
Specific Gravity, Urine: 1.005 (ref 1.005–1.030)
Squamous Epithelial / HPF: NONE SEEN (ref 0–5)
pH: 5 (ref 5.0–8.0)

## 2020-02-18 LAB — COMPREHENSIVE METABOLIC PANEL
ALT: 12 U/L (ref 0–44)
AST: 17 U/L (ref 15–41)
Albumin: 3.7 g/dL (ref 3.5–5.0)
Alkaline Phosphatase: 114 U/L (ref 38–126)
Anion gap: 8 (ref 5–15)
BUN: 15 mg/dL (ref 8–23)
CO2: 28 mmol/L (ref 22–32)
Calcium: 8.9 mg/dL (ref 8.9–10.3)
Chloride: 103 mmol/L (ref 98–111)
Creatinine, Ser: 0.99 mg/dL (ref 0.61–1.24)
GFR calc Af Amer: >60 mL/min (ref >60)
GFR calc non Af Amer: >60 mL/min (ref >60)
Glucose, Bld: 110 mg/dL — ABNORMAL HIGH (ref 70–99)
Potassium: 3.6 mmol/L (ref 3.5–5.1)
Sodium: 139 mmol/L (ref 135–145)
Total Bilirubin: 0.7 mg/dL (ref 0.3–1.2)
Total Protein: 7.4 g/dL (ref 6.5–8.1)

## 2020-02-18 LAB — LACTATE DEHYDROGENASE: LDH: 139 U/L (ref 98–192)

## 2020-02-18 NOTE — Progress Notes (Signed)
Hematology/Oncology follow-up note [facility] Telephone:([phone]) [phone] Fax:(336) [phone]   Patient Care Team: Tracie Harrier, MD as PCP - General (Internal Medicine) Earlie Server, MD as Consulting Physician (Oncology) Lucky Cowboy Erskine Squibb, MD as Referring Physician (Vascular Surgery) Abbie Sons, MD (Urology)  REFERRING PROVIDER: Tracie Harrier, MD  REASON FOR VISIT Follow up for surveillance for diffuse large B-cell lymphoma.  HISTORY OF PRESENTING ILLNESS:  John Gallegos is a  74 y.o.  male with PMH listed below presented for follow-up for treatment of diffuse large B cell lymphoma.  Pertinent history of oncology workup includes: Patient was found to have microscopic hematuria on 02/23/2017 with 4-10 RBC's/hpf.    Urine culture was negative.having symptoms of nocturia He is smoker. 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 He was in the [employer] and reports to be exposed to [place] in [place]. CT scan 04/20/2017 which revealed moderate left hydronephrosis and delayed excretion down to a large 4.4 cm mass in or around the left proximal ureter. There was also hazy soft tissue in the fat surrounding the celiac axis and superior mesenteric artery. Thickening of the right diaphragmatic crus. High gastrohepatic ligament lymphadenopathy measures up to 11 mm. Peripancreatic and root of small bowel mesentery adenopathy measures up to 2.6 cm.  He was seen by urologist Dr. Pilar Jarvis and he underwent cystoscopy, left retrograde pyelogram, left ureteroscopy and placement of left ureteral stent placement on 05/13/2017. Finding during the procedure that he has extrinsic compression of the ureter, possibly from lymphoma.  # Pathology:  Biopsy of paraspinal musculature at L2. Pathology results reviewed large B cell lymphoma. It is CD20 positive, CD10 positive. Flow cytometry shows a small clonal population of large B cells positive for CD10 and absent cop and lambda light  chains. The abnormal cells represent 2% of the total cells.The morphology, initial IHC panel, and flow cytometry supports classification as B-cell lymphoma, CD10 positive, with large cell features.diffuse large B-cell lymphoma, germinal center B-cell type. Discussed with pathologist Dr.Onley, Ki67 50%, MUM1 negative, MYC negative, Cycline D1 negative, CD30 negative, FISH is positive for BCL2 gene rearrangement. Negative for MYC and BCL6. 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   # Bone marrow Biopsy: negative for lymphoma involvement.  # PET scan 05/18/2017  IMPRESSION: 1. Multifocal metastatic disease within the LEFT retroperitoneum, central mesenteries, and RIGHT paraspinal musculature. 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 Recommend percutaneous biopsy of the RIGHT paraspinal musculature at L2. 2. 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 # Interim PET scan 07/28/2017 after 2 cycles of RCHOP 1. Marked improvement, with row solution of the prior bony lesions and significant reduction in activity of the central mesenteric and right gastric adenopathy. Central mesenteric nodal is currently Deauville 3, previously Deauville 4 and Deauville 5. 2. Improvement in the right paraspinal muscular activity, currently Deauville 3, previously Deauville 4. I also discussed with radiologist that there has been further improvement in the left periureteral density, likely reflecting treatment response.   # Prognostic Model for the risk of CNS lymphoma involvement per NCCN guideline,  Patient is intermediate risk with score of 2, one from being >60, and one from more than extranodal involvement>1 sites (bone, paraspinal muscle involvement). CNS prophylaxis is usually recommended if score 4-6.  CNS prophylaxis was not offered.    #08/04/2018 CT abdomen pelvis showed no evidence of acute abnormality.  Stable stranding haziness at the root of  mesentery  and retroperitoneum.  No new or enlarged lymph nodes identified.  Small to moderate umbilical hernia containing fat, moderate right lower lobe atelectasis and a small loculated right pleural effusion again noted.  Cholelithiasis without CT evidence of acute cholecystitis.  Prostate enlargement.  Aortic atherosclerosis.    # Treatment:  06/21/2017 Cycle 1 RCHOP [name]. Dexamethasone '20mg'$  on Day 1,  Prednisone 80 mg daily Day 2-5. 07/12/2017 Cycle 2 RCHOP [name] .Dexamethasone '20mg'$  on Day 1, Prednisone 80 mg daily Day 2-5 08/03/2017 Cycle 3 RCHOP/onpro.Dexamethasone '20mg'$  on Day 1, Prednisone 80 mg daily Day 2-5  08/24/2017 Cycle 4 RCHOP/onpro.Dexamethasone '20mg'$  on Day 1, Prednisone 80 mg daily Day 2-5  09/14/2017 Cycle 5 RCHOP/onpro.Dexamethasone '20mg'$  on Day 1, Prednisone 80 mg daily Day 2-5  10/05/2017 Cycle 6 RCHOP/onpro.Dexamethasone '20mg'$  on Day 1, Prednisone 80 mg daily Day 2-5   # Chronic back pain, previous MRI showed diffuse lumbar spine spondylosis. INTERVAL HISTORY 74 y.o. male with oncology history listed as above reviewed by me today presents for follow-up for diffuse large B-cell lymphoma. Patient reports feeling well.  He has no new complaints. No significant more fatigue than his baseline level .  Denies any constitutional symptoms. No gross hematuria. Chronic back pain has not changed pain Stable chronic neuropathy.  Review of Systems  Constitutional: Positive for fatigue. Negative for appetite change, chills, fever and unexpected weight change.  HENT:   Negative for hearing loss and voice change.   Eyes: Negative for eye problems and icterus.  Respiratory: Negative for chest tightness, cough and shortness of breath.   Cardiovascular: Negative for chest pain and leg swelling.  Gastrointestinal: Negative for abdominal distention and abdominal pain.  Endocrine: Negative for hot flashes.  Genitourinary: Negative for difficulty urinating, dysuria and frequency.    Musculoskeletal: Negative for arthralgias.  Skin: Negative for itching and rash.  Neurological: Negative for light-headedness and numbness.  Hematological: Negative for adenopathy. Does not bruise/bleed easily.  Psychiatric/Behavioral: Negative for confusion.     MEDICAL HISTORY:  Past Medical History:  Diagnosis Date  . Gout   . Hard of hearing   . Hiatal hernia   . Hypertension   . Non Hodgkin's lymphoma ([place]) 04/2017   B-cell, chemo tx's and in remission in 2019  . Urinary incontinence    frequency    SURGICAL HISTORY: Past Surgical History:  Procedure Laterality Date  . CYSTOSCOPY W/ RETROGRADES Left 08/19/2017   Procedure: CYSTOSCOPY WITH RETROGRADE PYELOGRAM;  Surgeon: Abbie Sons, MD;  Location: [facility];  Service: Urology;  Laterality: Left;  . CYSTOSCOPY W/ URETERAL STENT REMOVAL Left 08/19/2017   Procedure: CYSTOSCOPY WITH STENT REMOVAL;  Surgeon: Abbie Sons, MD;  Location: [facility];  Service: Urology;  Laterality: Left;  . CYSTOSCOPY WITH BIOPSY Left 05/13/2017   Procedure: CYSTOSCOPY WITH URETERAL BIOPSY;  Surgeon: Nickie Retort, MD;  Location: [facility];  Service: Urology;  Laterality: Left;  . CYSTOSCOPY WITH STENT PLACEMENT Left 05/13/2017   Procedure: CYSTOSCOPY WITH STENT PLACEMENT;  Surgeon: Nickie Retort, MD;  Location: [facility];  Service: Urology;  Laterality: Left;  . PORTA CATH INSERTION N/A 06/14/2017   Procedure: PORTA CATH INSERTION;  Surgeon: Algernon Huxley, MD;  Location: [facility];  Service: Cardiovascular;  Laterality: N/A;  . 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 right knne surgery     needle went through knee  . TONSILLECTOMY    . URETEROSCOPY Left 05/13/2017   Procedure: URETEROSCOPY;  Surgeon: Baruch Gouty  Fayrene Fearing, MD;  Location: [facility];  Service: Urology;  Laterality: Left;  . XI ROBOTIC ASSISTED VENTRAL HERNIA N/A 05/18/2019   Procedure: XI ROBOTIC ASSISTED UMBILICAL HERNIA;  Surgeon: Sung Amabile, DO;  Location: [facility]  [facility];  Service: General;  Laterality: N/A;    SOCIAL HISTORY: Social History   Socioeconomic History  . Marital status: Married    Spouse name: Not on file  . Number of children: Not on file  . Years of education: Not on file  . Highest education level: Not on file  Occupational History  . Not on file  Tobacco Use  . Smoking status: Former Smoker    Packs/day: 0.50    Years: 50.00    Pack years: 25.00    Types: Cigarettes    Quit date: 12/12/2016    Years since quitting: 3.1  . Smokeless tobacco: Never Used  Vaping Use  . Vaping Use: Never used  Substance and Sexual Activity  . Alcohol use: Yes    Alcohol/week: 3.0 standard drinks    Types: 3 Shots of liquor per week  . Drug use: No  . Sexual activity: Not on file  Other Topics Concern  . Not on file  Social History Narrative  . Not on file   Social Determinants of Health   Financial Resource Strain:   . Difficulty of Paying Living Expenses:   Food Insecurity:   . Worried About Programme researcher, broadcasting/film/video in the Last Year:   . Barista in the Last Year:   Transportation Needs:   . Freight forwarder (Medical):   Marland Kitchen Lack of Transportation (Non-Medical):   Physical Activity:   . Days of Exercise per Week:   . Minutes of Exercise per Session:   Stress:   . Feeling of Stress :   Social Connections:   . Frequency of Communication with Friends and Family:   . Frequency of Social Gatherings with Friends and Family:   . Attends Religious Services:   . Active Member of Clubs or Organizations:   . Attends Banker Meetings:   Marland Kitchen Marital Status:   Intimate Partner Violence:   . Fear of Current or Ex-Partner:   . Emotionally Abused:   Marland Kitchen Physically Abused:   . Sexually Abused:     FAMILY HISTORY: Family History  Problem Relation Age of Onset  . Pancreatic cancer Mother   . Aneurysm Mother   . Heart attack Mother   . Lung cancer Father   . Lymphoma Sister   . Prostate cancer Neg Hx   . Kidney  cancer Neg Hx   . Bladder Cancer Neg Hx     ALLERGIES:  is allergic to amlodipine.  MEDICATIONS:  Current Outpatient Medications  Medication Sig Dispense Refill  . aspirin EC 81 MG tablet Take 81 mg by mouth daily.    . carvedilol (COREG) 12.5 MG tablet Take 1 tablet (12.5 mg total) by mouth 2 (two) times daily with a meal. 180 tablet 1  . EPINEPHrine 0.3 mg/0.3 mL IJ SOAJ injection Inject 0.3 mg into the muscle as needed for anaphylaxis.   3  . ezetimibe (ZETIA) 10 MG tablet Take 10 mg by mouth daily.    Marland Kitchen gabapentin (NEURONTIN) 300 MG capsule Take 1 capsule (300 mg total) by mouth at bedtime. 90 capsule 1  . hydrALAZINE (APRESOLINE) 100 MG tablet Take 100 mg by mouth 2 (two) times daily.     Marland Kitchen ibuprofen (ADVIL) 800 MG tablet Take  1 tablet (800 mg total) by mouth every 8 (eight) hours as needed for mild pain or moderate pain. 30 tablet 0  . losartan-hydrochlorothiazide (HYZAAR) 100-25 MG tablet Take 1 tablet by mouth daily.     . potassium chloride (KLOR-CON) 20 MEQ packet Take one tablet by mouth every Monday, Wednesday, Friday.    . pravastatin (PRAVACHOL) 40 MG tablet TAKE 1 TABLET BY MOUTH EVERY NIGHT    . traMADol (ULTRAM) 50 MG tablet Take 50 mg by mouth every 6 (six) hours as needed for moderate pain.    Marland Kitchen zolpidem (AMBIEN) 10 MG tablet Take 10 mg by mouth at bedtime.      No current facility-administered medications for this visit.      Marland Kitchen  PHYSICAL EXAMINATION: ECOG PERFORMANCE STATUS: 0 - Asymptomatic Vitals:   02/18/20 1313  BP: (!) 145/75  Pulse: (!) 54  Resp: 18  Temp: (!) 96.6 F (35.9 C)   Filed Weights   02/18/20 1313  Weight: 231 lb 4.8 oz (104.9 kg)    Physical Exam Constitutional:      General: He is not in acute distress.    Appearance: He is not diaphoretic.  HENT:     Head: Normocephalic and atraumatic.     Nose: Nose normal.     Mouth/Throat:     Pharynx: No oropharyngeal exudate.  Eyes:     General: No scleral icterus.       Right eye: No  discharge.        Left eye: No discharge.     Conjunctiva/sclera: Conjunctivae normal.     Pupils: Pupils are equal, round, and reactive to light.  Neck:     Vascular: No JVD.  Cardiovascular:     Rate and Rhythm: Normal rate and regular rhythm.     Heart sounds: Normal heart sounds. No murmur heard.  No gallop.   Pulmonary:     Effort: Pulmonary effort is normal. No respiratory distress.     Breath sounds: Normal breath sounds. No wheezing or rales.  Chest:     Chest wall: No tenderness.  Abdominal:     General: Bowel sounds are normal. There is no distension.     Palpations: Abdomen is soft. There is no mass.     Tenderness: There is no abdominal tenderness. There is no rebound.  Musculoskeletal:        General: No tenderness or deformity. Normal range of motion.     Cervical back: Normal range of motion and neck supple.  Lymphadenopathy:     Cervical: No cervical adenopathy.  Skin:    General: Skin is warm and dry.     Findings: No erythema or rash.     Comments: Scattered chronic skin pigmentation Skin tag  Neurological:     Mental Status: He is alert and oriented to person, place, and time.     Cranial Nerves: No cranial nerve deficit.     Motor: No abnormal muscle tone.     Coordination: Coordination normal.     Gait: Gait is intact.  Psychiatric:        Mood and Affect: Affect normal.        Judgment: Judgment normal.     LABORATORY DATA:  I have reviewed the data as listed Lab Results  Component Value Date   WBC 6.3 02/18/2020   HGB 13.2 02/18/2020   HCT 38.2 (L) 02/18/2020   MCV 84.9 02/18/2020   PLT 168 02/18/2020   Recent Labs  05/22/19 1015 08/21/19 0955 02/18/20 1216  NA 141 138 139  K 3.2* 3.0* 3.6  CL 106 102 103  CO2 '27 27 28  '$ GLUCOSE 112* 116* 110*  BUN '18 20 15  '$ CREATININE 1.11 0.96 0.99  CALCIUM 9.3 8.9 8.9  GFRNONAA >60 >60 >60  GFRAA >60 >60 >60  PROT 6.9 7.0 7.4  ALBUMIN 3.8 3.6 3.7  AST 17 14* 17  ALT '13 11 12  '$ ALKPHOS 82  106 114  BILITOT 0.7 0.7 0.7   Hepatitis B antigen negative. 2-D echo.showed LVEF 60%  RADIOGRAPHIC STUDIES: I have personally reviewed the radiological images as listed and agreed with the findings in the report. 08/04/2018 CT abdomen and pelvis w contrast 1. No evidence of acute abnormality. 2. Stable stranding/haziness at the root of the mesentery and retroperitoneum. No new or enlarged lymph nodes identified. 3. Small to moderate umbilical hernia containing fat, moderate RIGHT LOWER lobe atelectasis and small loculated RIGHT pleural effusion again noted. 4. Cholelithiasis without CT evidence of acute cholecystitis. 5. Prostate enlargement 6.  Aortic Atherosclerosis (ICD10-I70.0). VAS [place] LOWER EXTREMITY VENOUS REFLUX  Result Date: 12/04/2019  Lower Venous Reflux Study Indications: Pain, and Swelling.  Performing Technologist: Charlane Ferretti RT (R)(VS)  Examination Guidelines: A complete evaluation includes B-mode imaging, spectral Doppler, color Doppler, and power Doppler as needed of all accessible portions of each vessel. Bilateral testing is considered an integral part of a complete examination. Limited examinations for reoccurring indications may be performed as noted. The reflux portion of the exam is performed with the patient in reverse Trendelenburg. Significant venous reflux is defined as >500 ms in the superficial venous system, and >1 second in the deep venous system.  Venous Reflux Times +--------------+---------+----------+-----------+------------+---------+ RIGHT         Reflux NoReflux YesReflux TimeDiameter cmsComments  +--------------+---------+----------+-----------+------------+---------+ CFV           no                                                  +--------------+---------+----------+-----------+------------+---------+ Popliteal     no                                                   +--------------+---------+----------+-----------+------------+---------+ GSV at SFJ    no                                .59               +--------------+---------+----------+-----------+------------+---------+ GSV prox thighno                                .46               +--------------+---------+----------+-----------+------------+---------+ GSV mid thigh no                                .38               +--------------+---------+----------+-----------+------------+---------+ GSV dist thigh  yes      954 ms       .35               +--------------+---------+----------+-----------+------------+---------+ GSV at knee   no                                .38     Branching +--------------+---------+----------+-----------+------------+---------+ GSV prox calf                                   .31               +--------------+---------+----------+-----------+------------+---------+ SSV Pop Fossa no                                                  +--------------+---------+----------+-----------+------------+---------+  +--------------+---------+----------+-----------+------------+---------+ LEFT          Reflux NoReflux YesReflux TimeDiameter cmsComments  +--------------+---------+----------+-----------+------------+---------+ CFV                       yes      1863 ms                        +--------------+---------+----------+-----------+------------+---------+ FV mid        no                                                  +--------------+---------+----------+-----------+------------+---------+ Popliteal     no                                                  +--------------+---------+----------+-----------+------------+---------+ GSV at SFJ                yes      697 ms                         +--------------+---------+----------+-----------+------------+---------+ GSV prox thighno                                                   +--------------+---------+----------+-----------+------------+---------+ GSV mid thigh no                                        Branching +--------------+---------+----------+-----------+------------+---------+ GSV dist thighno                                                  +--------------+---------+----------+-----------+------------+---------+ GSV at knee   no                                                  +--------------+---------+----------+-----------+------------+---------+  GSV prox calf                                           Branching +--------------+---------+----------+-----------+------------+---------+ SSV Pop Fossa no                                                  +--------------+---------+----------+-----------+------------+---------+  Summary: Right: - No evidence of deep vein thrombosis seen in the right lower extremity, from the common femoral through the popliteal veins. - No evidence of superficial venous thrombosis in the right lower extremity. - Venous reflux is noted in the right greater saphenous vein in the calf.  Left: - No evidence of deep vein thrombosis seen in the left lower extremity, from the common femoral through the popliteal veins. - No evidence of superficial venous thrombosis in the left lower extremity. - Venous reflux is noted in the left common femoral vein. - Venous reflux is noted in the left sapheno-femoral junction.  *See table(s) above for measurements and observations. Electronically signed by Leotis Pain MD on 12/04/2019 at 1:03:59 PM.    Final      ASSESSMENT & PLAN:  This is a 74 y.o. male with stage IV diffuse large B-cell lymphoma, s/p first-line chemotherapy with R CHOP with curative intent, present for follow-up.  1. Diffuse large B-cell lymphoma of extranodal site excluding spleen and other solid organs ([place])   2. Port-A-Cath in place    # DLBCL, clinically doing well, in complete  remission.  Labs are reviewed and discussed with patient.  LDH has been stable.  UA is negative for microscopic hematuria. It has been 2.5 years after he finishes treatment Recommend continue patient physical every 6 to 12 months. Image as clinically indicated.  #Port-A-Cath in place, patient desires to discontinue the port. I will refer patient back to see Dr. Lucky Cowboy for port discontinuation.  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  Stable symptoms. Return of visit: lab (cbc, cmp, LDH, ua) /MD in 6 months.  Orders Placed This Encounter  Procedures  . CBC with Differential/Platelet    Standing Status:   Future    Standing Expiration Date:   02/17/2021  . Comprehensive metabolic panel    Standing Status:   Future    Standing Expiration Date:   02/17/2021  . Lactate dehydrogenase    Standing Status:   Future    Standing Expiration Date:   02/17/2021  . Ambulatory referral to Vascular Surgery    Referral Priority:   Routine    Referral Type:   Surgical    Referral Reason:   Specialty Services Required    Referred to Provider:   Algernon Huxley, MD    Requested Specialty:   Vascular Surgery    Number of Visits Requested:   1   Earlie Server, MD, PhD Hematology Oncology [facility] Pager- [phone] 02/18/20

## 2020-02-18 NOTE — Progress Notes (Signed)
Patient denies new problems/concerns today.  He would like to discuss getting his port removed.

## 2020-02-20 ENCOUNTER — Telehealth (INDEPENDENT_AMBULATORY_CARE_PROVIDER_SITE_OTHER): Payer: Self-pay

## 2020-02-20 NOTE — Telephone Encounter (Signed)
Spoke with the patient and he is scheduled with Dr. Lucky Cowboy for a Port removal on 02/25/20 with a 6:45 am arrival time to the [facility]. Covid testing is on 02/21/20 between 8-1 pm at the [facility]. Pre-procedure instructions were discussed and will be mailed.

## 2020-02-21 ENCOUNTER — Other Ambulatory Visit: Payer: Self-pay

## 2020-02-21 ENCOUNTER — Other Ambulatory Visit
Admission: RE | Admit: 2020-02-21 | Discharge: 2020-02-21 | Disposition: A | Discharge status: Home / Self Care | Payer: Medicare HMO | Source: Ambulatory Visit | Attending: Vascular Surgery | Admitting: Vascular Surgery

## 2020-02-21 DIAGNOSIS — Z01812 Encounter for preprocedural laboratory examination: Secondary | ICD-10-CM | POA: Diagnosis not present

## 2020-02-21 DIAGNOSIS — Z20822 Contact with and (suspected) exposure to covid-19: Secondary | ICD-10-CM | POA: Insufficient documentation

## 2020-02-21 LAB — SARS CORONAVIRUS 2 (TAT 6-24 HRS): SARS Coronavirus 2: NEGATIVE

## 2020-02-24 ENCOUNTER — Other Ambulatory Visit (INDEPENDENT_AMBULATORY_CARE_PROVIDER_SITE_OTHER): Payer: Self-pay | Admitting: Nurse Practitioner

## 2020-02-25 ENCOUNTER — Other Ambulatory Visit: Payer: Self-pay

## 2020-02-25 ENCOUNTER — Encounter: Payer: Self-pay | Admitting: Vascular Surgery

## 2020-02-25 ENCOUNTER — Ambulatory Visit
Admission: RE | Admit: 2020-02-25 | Discharge: 2020-02-25 | Disposition: A | Discharge status: Home / Self Care | Payer: Medicare HMO | Attending: Vascular Surgery | Admitting: Vascular Surgery

## 2020-02-25 ENCOUNTER — Encounter
Admission: RE | Disposition: A | Discharge status: Home / Self Care | Payer: Self-pay | Source: Home / Self Care | Attending: Vascular Surgery

## 2020-02-25 DIAGNOSIS — I10 Essential (primary) hypertension: Secondary | ICD-10-CM | POA: Diagnosis not present

## 2020-02-25 DIAGNOSIS — E785 Hyperlipidemia, unspecified: Secondary | ICD-10-CM | POA: Insufficient documentation

## 2020-02-25 DIAGNOSIS — C8300 Small cell B-cell lymphoma, unspecified site: Secondary | ICD-10-CM

## 2020-02-25 DIAGNOSIS — C8590 Non-Hodgkin lymphoma, unspecified, unspecified site: Secondary | ICD-10-CM | POA: Diagnosis not present

## 2020-02-25 DIAGNOSIS — M109 Gout, unspecified: Secondary | ICD-10-CM | POA: Insufficient documentation

## 2020-02-25 DIAGNOSIS — Z888 Allergy status to other drugs, medicaments and biological substances status: Secondary | ICD-10-CM | POA: Diagnosis not present

## 2020-02-25 DIAGNOSIS — Z87891 Personal history of nicotine dependence: Secondary | ICD-10-CM | POA: Diagnosis not present

## 2020-02-25 DIAGNOSIS — Z452 Encounter for adjustment and management of vascular access device: Secondary | ICD-10-CM | POA: Diagnosis not present

## 2020-02-25 HISTORY — PX: PORTA CATH REMOVAL: CATH118286

## 2020-02-25 SURGERY — PORTA CATH REMOVAL
Anesthesia: Moderate Sedation

## 2020-02-25 MED ORDER — CEFAZOLIN SODIUM-DEXTROSE 2-4 GM/100ML-% IV SOLN: 2.0000 g | Freq: Once | INTRAVENOUS | Status: AC

## 2020-02-25 MED ORDER — METHYLPREDNISOLONE SODIUM SUCC 125 MG IJ SOLR: 125.0000 mg | Freq: Once | INTRAMUSCULAR | Status: DC | PRN

## 2020-02-25 MED ORDER — CEFAZOLIN SODIUM-DEXTROSE 2-4 GM/100ML-% IV SOLN
INTRAVENOUS | Status: AC
  Filled 2020-02-25: qty 100

## 2020-02-25 MED ORDER — SODIUM CHLORIDE 0.9 % IV SOLN: INTRAVENOUS | Status: DC

## 2020-02-25 MED ORDER — MIDAZOLAM HCL 5 MG/5ML IJ SOLN
INTRAMUSCULAR | Status: AC
  Filled 2020-02-25: qty 5

## 2020-02-25 MED ORDER — MIDAZOLAM HCL 2 MG/ML PO SYRP: 8.0000 mg | ORAL_SOLUTION | Freq: Once | ORAL | Status: DC | PRN

## 2020-02-25 MED ORDER — FAMOTIDINE 20 MG PO TABS: 40.0000 mg | ORAL_TABLET | Freq: Once | ORAL | Status: DC | PRN

## 2020-02-25 MED ORDER — ONDANSETRON HCL 4 MG/2ML IJ SOLN: 4.0000 mg | Freq: Four times a day (QID) | INTRAMUSCULAR | Status: DC | PRN

## 2020-02-25 MED ORDER — FENTANYL CITRATE (PF) 100 MCG/2ML IJ SOLN
INTRAMUSCULAR | Status: DC | PRN
  Administered 2020-02-25: 50 ug via INTRAVENOUS

## 2020-02-25 MED ORDER — DIPHENHYDRAMINE HCL 50 MG/ML IJ SOLN: 50.0000 mg | Freq: Once | INTRAMUSCULAR | Status: DC | PRN

## 2020-02-25 MED ORDER — HYDROMORPHONE HCL 1 MG/ML IJ SOLN: 1.0000 mg | Freq: Once | INTRAMUSCULAR | Status: DC | PRN

## 2020-02-25 MED ORDER — CHLORHEXIDINE GLUCONATE CLOTH 2 % EX PADS
6.0000 | MEDICATED_PAD | CUTANEOUS | Status: DC | PRN
  Administered 2020-02-25: 6 via TOPICAL

## 2020-02-25 MED ORDER — MIDAZOLAM HCL 2 MG/2ML IJ SOLN
INTRAMUSCULAR | Status: DC | PRN
  Administered 2020-02-25: 2 mg via INTRAVENOUS

## 2020-02-25 MED ORDER — FENTANYL CITRATE (PF) 100 MCG/2ML IJ SOLN
INTRAMUSCULAR | Status: AC
  Filled 2020-02-25: qty 2

## 2020-02-25 SURGICAL SUPPLY — 9 items
CHLORAPREP W/TINT 26 (MISCELLANEOUS) ×4 IMPLANT
DERMABOND ADVANCED (GAUZE/BANDAGES/DRESSINGS) ×1
DERMABOND ADVANCED .7 DNX12 (GAUZE/BANDAGES/DRESSINGS) ×1 IMPLANT
PACK ANGIOGRAPHY (CUSTOM PROCEDURE TRAY) ×2 IMPLANT
SUT MNCRL AB 4-0 PS2 18 (SUTURE) ×2 IMPLANT
SUT VIC AB 3-0 SH 27 (SUTURE) ×1
SUT VIC AB 3-0 SH 27X BRD (SUTURE) ×1 IMPLANT
TOWEL OR 17X26 4PK STRL BLUE (TOWEL DISPOSABLE) ×2 IMPLANT
TUBING CONTRAST HIGH PRESS 72 (TUBING) ×4 IMPLANT

## 2020-02-25 NOTE — H&P (Signed)
[facility] Admission History & Physical  MRN : [id number]  John Gallegos is a 74 y.o. (1946/03/14) male who presents with chief complaint of No chief complaint on file. Marland Kitchen  History of Present Illness: Patient presents today for removal of his Port-A-Cath.  Has lymphoma and has long since completed therapy.  No longer needs the port.  It is mildly bothersome to him and he would like to have it removed. Has also been seen for lower extremity swelling previously and seems to be doing better with this with compression and elevation.  Current Facility-Administered Medications  Medication Dose Route Frequency Provider Last Rate Last Admin  . fentaNYL (SUBLIMAZE) 100 MCG/2ML injection           . midazolam (VERSED) 5 MG/5ML injection           . 0.9 %  sodium chloride infusion   Intravenous Continuous Kris Hartmann, NP 75 mL/hr at 02/25/20 0729 New Bag at 02/25/20 0729  . ceFAZolin (ANCEF) 2-4 GM/100ML-% IVPB           . ceFAZolin (ANCEF) IVPB 2g/100 mL premix  2 g Intravenous Once Kris Hartmann, NP      . Chlorhexidine Gluconate Cloth 2 % PADS 6 each  6 each Topical PRN Kris Hartmann, NP   6 each at 02/25/20 0730  . diphenhydrAMINE (BENADRYL) injection 50 mg  50 mg Intravenous Once PRN Kris Hartmann, NP      . famotidine (PEPCID) tablet 40 mg  40 mg Oral Once PRN Kris Hartmann, NP      . HYDROmorphone (DILAUDID) injection 1 mg  1 mg Intravenous Once PRN Eulogio Ditch E, NP      . methylPREDNISolone sodium succinate (SOLU-MEDROL) 125 mg/2 mL injection 125 mg  125 mg Intravenous Once PRN Eulogio Ditch E, NP      . midazolam (VERSED) 2 MG/ML syrup 8 mg  8 mg Oral Once PRN Kris Hartmann, NP      . ondansetron (ZOFRAN) injection 4 mg  4 mg Intravenous Q6H PRN Kris Hartmann, NP        Past Medical History:  Diagnosis Date  . Gout   . Hard of hearing   . Hiatal hernia   . Hypertension   . 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 Urinary incontinence    frequency    Past Surgical History:  Procedure Laterality Date  . CYSTOSCOPY W/ RETROGRADES Left 08/19/2017   Procedure: CYSTOSCOPY WITH RETROGRADE PYELOGRAM;  Surgeon: Abbie Sons, MD;  Location: [facility];  Service: Urology;  Laterality: Left;  . CYSTOSCOPY W/ URETERAL STENT REMOVAL Left 08/19/2017   Procedure: CYSTOSCOPY WITH STENT REMOVAL;  Surgeon: Abbie Sons, MD;  Location: [facility];  Service: Urology;  Laterality: Left;  . CYSTOSCOPY WITH BIOPSY Left 05/13/2017   Procedure: CYSTOSCOPY WITH URETERAL BIOPSY;  Surgeon: Nickie Retort, MD;  Location: [facility];  Service: Urology;  Laterality: Left;  . CYSTOSCOPY WITH STENT PLACEMENT Left 05/13/2017   Procedure: CYSTOSCOPY WITH STENT PLACEMENT;  Surgeon: Nickie Retort, MD;  Location: [facility];  Service: Urology;  Laterality: Left;  . PORTA CATH INSERTION N/A 06/14/2017   Procedure: PORTA CATH INSERTION;  Surgeon: Algernon Huxley, MD;  Location: [facility];  Service: Cardiovascular;  Laterality: N/A;  . 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 right knne surgery  needle went through knee  . TONSILLECTOMY    . URETEROSCOPY Left 05/13/2017   Procedure: URETEROSCOPY;  Surgeon: Nickie Retort, MD;  Location: [facility];  Service: Urology;  Laterality: Left;  . XI ROBOTIC ASSISTED VENTRAL HERNIA N/A 05/18/2019   Procedure: XI ROBOTIC ASSISTED UMBILICAL HERNIA;  Surgeon: Benjamine Sprague, DO;  Location: [facility];  Service: General;  Laterality: N/A;     Social History   Tobacco Use  . Smoking status: Former Smoker    Packs/day: 0.50    Years: 50.00    Pack years: 25.00    Types: Cigarettes    Quit date: 12/12/2016    Years since quitting: 3.2  . Smokeless tobacco: Never Used  Vaping Use  . Vaping Use: Never used  Substance Use Topics  . Alcohol use: Yes    Alcohol/week: 3.0 standard drinks    Types: 3 Shots of liquor per week  . Drug use: No     Family History   Problem Relation Age of Onset  . Pancreatic cancer Mother   . Aneurysm Mother   . Heart attack Mother   . Lung cancer Father   . Lymphoma Sister   . Prostate cancer Neg Hx   . Kidney cancer Neg Hx   . Bladder Cancer Neg Hx     Allergies  Allergen Reactions  . Amlodipine     Severe leg swelling     REVIEW OF SYSTEMS (Negative unless checked)  Constitutional: [] Weight loss  [] Fever  [] Chills Cardiac: [] Chest pain   [] Chest pressure   [] Palpitations   [] Shortness of breath when laying flat   [] Shortness of breath at rest   [] Shortness of breath with exertion. Vascular:  [x] Pain in legs with walking   [] Pain in legs at rest   [] Pain in legs when laying flat   [] Claudication   [] Pain in feet when walking  [] Pain in feet at rest  [] Pain in feet when laying flat   [] History of DVT   [] Phlebitis   [x] Swelling in legs   [] Varicose veins   [] Non-healing ulcers Pulmonary:   [] Uses home oxygen   [] Productive cough   [] Hemoptysis   [] Wheeze  [] COPD   [] Asthma Neurologic:  [] Dizziness  [] Blackouts   [] Seizures   [] History of stroke   [] History of TIA  [] Aphasia   [] Temporary blindness   [] Dysphagia   [] Weakness or numbness in arms   [] Weakness or numbness in legs Musculoskeletal:  [x] Arthritis   [] Joint swelling   [x] Joint pain   [] Low back pain Hematologic:  [] Easy bruising  [] Easy bleeding   [] Hypercoagulable state   [] Anemic  [] Hepatitis Gastrointestinal:  [] Blood in stool   [] Vomiting blood  [] Gastroesophageal reflux/heartburn   [] Difficulty swallowing. Genitourinary:  [] Chronic kidney disease   [] Difficult urination  [] Frequent urination  [] Burning with urination   [] Blood in urine Skin:  [] Rashes   [] Ulcers   [] Wounds Psychological:  [] History of anxiety   []  History of major depression.  Physical Examination  Vitals:   02/25/20 0725  BP: (!) 160/84  Pulse: 61  Resp: (!) 24  Temp: 98.5 F (36.9 C)  TempSrc: Oral  SpO2: 96%  Weight: 104.3 kg  Height: 6\' 1"  (1.854 m)   Body mass  index is 30.34 kg/m. Gen: WD/WN, NAD Head: [place]/AT, No temporalis wasting.  Ear/Nose/Throat: Hearing grossly intact, nares w/o erythema or drainage, oropharynx w/o Erythema/Exudate,  Eyes: Conjunctiva clear, sclera non-icteric Neck: Trachea midline.  No JVD.  Pulmonary:  Good air movement, respirations  not labored, no use of accessory muscles.  Cardiac: RRR, normal S1, S2. Vascular:  Vessel Right Left  Radial Palpable Palpable                   Musculoskeletal: M/S 5/5 throughout.  Extremities without ischemic changes.  No deformity or atrophy.  Mild lower extremity swelling Neurologic: Sensation grossly intact in extremities.  Symmetrical.  Speech is fluent. Motor exam as listed above. Psychiatric: Judgment intact, Mood & affect appropriate for pt's clinical situation. Dermatologic: No rashes or ulcers noted.  No cellulitis or open wounds.      CBC Lab Results  Component Value Date   WBC 6.3 02/18/2020   HGB 13.2 02/18/2020   HCT 38.2 (L) 02/18/2020   MCV 84.9 02/18/2020   PLT 168 02/18/2020    BMET    Component Value Date/Time   NA 139 02/18/2020 1216   NA 133 (L) 09/18/2013 1128   K 3.6 02/18/2020 1216   K 3.1 (L) 09/18/2013 1128   CL 103 02/18/2020 1216   CL 98 09/18/2013 1128   CO2 28 02/18/2020 1216   CO2 29 09/18/2013 1128   GLUCOSE 110 (H) 02/18/2020 1216   GLUCOSE 97 09/18/2013 1128   BUN 15 02/18/2020 1216   BUN 16 04/05/2017 1514   BUN 15 09/18/2013 1128   CREATININE 0.99 02/18/2020 1216   CREATININE 1.25 09/18/2013 1128   CALCIUM 8.9 02/18/2020 1216   CALCIUM 9.1 09/18/2013 1128   GFRNONAA >60 02/18/2020 1216   GFRNONAA 59 (L) 09/18/2013 1128   GFRAA >60 02/18/2020 1216   GFRAA >60 09/18/2013 1128   Estimated Creatinine Clearance: 83.1 mL/min (by C-G formula based on SCr of 0.99 mg/dL).  COAG Lab Results  Component Value Date   INR 0.96 06/13/2017   INR 0.96 06/01/2017    Radiology No results  found.   Assessment/Plan Hypertension blood pressure control important in reducing the progression of atherosclerotic disease. On appropriate oral medications.   Hyperlipidemia, unspecified lipid control important in reducing the progression of atherosclerotic disease. 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  He no longer needs his port if he has completed therapy some years ago and would like to have this removed.  This is being removed today.   Leotis Pain, MD  02/25/2020 8:16 AM

## 2020-02-25 NOTE — Op Note (Signed)
[facility]       Operative Note  Date: 02/25/2020  Preoperative diagnosis:  1. Lymphoma, completed therapy no longer using port  Postoperative diagnosis:  Same as above  Procedures: #1. Removal of right jugular port a cath   Surgeon: Leotis Pain, MD  Anesthesia: Local with moderate conscious sedation for 15 minutes using 2 mg of Versed and 50 mcg of Fentanyl  Fluoroscopy time: none  Contrast used: 0  Estimated blood loss: Minimal  Indication for the procedure:  The patient is a 74 y.o. male who has completed his therapy for lymphoma and no longer needs their Port-A-Cath. The patient desires to have this removed. Risks and benefits including need for potential replacement with recurrent disease were discussed and patient is agreeable to proceed.  Description of procedure: The patient was brought to the vascular and interventional radiology suite. Moderate conscious sedation was administered during a face to face encounter with the patient throughout the procedure with my supervision of the RN administering medicines and monitoring the patient's vital signs, pulse oximetry, telemetry and mental status throughout from the start of the procedure until the patient was taken to the recovery room.  The right neck chest and shoulder were sterilely prepped and draped, and a sterile surgical field was created. The area was then anesthetized with 1% lidocaine copiously. The previous incision was reopened and electrocautery used to dissected down to the port and the catheter. These were dissected free and the catheter was gently removed from the vein in its entirety. The port was dissected out from the fibrous connective tissue and the Prolene sutures were removed. The port was then removed in its entirety including the catheter. The wound was then closed with a 3-0 Vicryl and a 4-0 Monocryl and Dermabond was placed as a dressing. The  patient was then taken to the recovery room in stable condition having tolerated the procedure well.  Complications: none  Condition: stable   Leotis Pain, MD 02/25/2020 8:43 AM   This note was created with Dragon Medical transcription system. Any errors in dictation are purely unintentional.

## 2020-02-25 NOTE — Discharge Instructions (Signed)
Implanted Port Removal, Care After This sheet gives you information about how to care for yourself after your procedure. Your health care provider may also give you more specific instructions. If you have problems or questions, contact your health care provider. What can I expect after the procedure? After the procedure, it is common to have:  Soreness or pain near your incision.  Some swelling or bruising near your incision. Follow these instructions at home: Medicines  Take over-the-counter and prescription medicines only as told by your health care provider.  If you were prescribed an antibiotic medicine, take it as told by your health care provider. Do not stop taking the antibiotic even if you start to feel better. Bathing  Do not take baths, swim, or use a hot tub until your health care provider approves. Ask your health care provider if you can take showers. You may only be allowed to take sponge baths. Incision care   Follow instructions from your health care provider about how to take care of your incision. Make sure you: ? Wash your hands with soap and water before you change your bandage (dressing). If soap and water are not available, use hand sanitizer. ? Change your dressing as told by your health care provider. ? Keep your dressing dry. ? Leave stitches (sutures), skin glue, or adhesive strips in place. These skin closures may need to stay in place for 2 weeks or longer. If adhesive strip edges start to loosen and curl up, you may trim the loose edges. Do not remove adhesive strips completely unless your health care provider tells you to do that.  Check your incision area every day for signs of infection. Check for: ? More redness, swelling, or pain. ? More fluid or blood. ? Warmth. ? Pus or a bad smell. Driving   Do not drive for 24 hours if you were given a medicine to help you relax (sedative) during your procedure.  If you did not receive a sedative, ask your  health care provider when it is safe to drive. Activity  Return to your normal activities as told by your health care provider. Ask your health care provider what activities are safe for you.  Do not lift anything that is heavier than 10 lb (4.5 kg), or the limit that you are told, until your health care provider says that it is safe.  Do not do activities that involve lifting your arms over your head. General instructions  Do not use any products that contain nicotine or tobacco, such as cigarettes and e-cigarettes. These can delay healing. If you need help quitting, ask your health care provider.  Keep all follow-up visits as told by your health care provider. This is important. Contact a health care provider if:  You have more redness, swelling, or pain around your incision.  You have more fluid or blood coming from your incision.  Your incision feels warm to the touch.  You have pus or a bad smell coming from your incision.  You have pain that is not relieved by your pain medicine. Get help right away if you have:  A fever or chills.  Chest pain.  Difficulty breathing. Summary  After the procedure, it is common to have pain, soreness, swelling, or bruising near your incision.  If you were prescribed an antibiotic medicine, take it as told by your health care provider. Do not stop taking the antibiotic even if you start to feel better.  Do not drive for 24 hours   if you were given a sedative during your procedure.  Return to your normal activities as told by your health care provider. Ask your health care provider what activities are safe for you. This information is not intended to replace advice given to you by your health care provider. Make sure you discuss any questions you have with your health care provider. Document Revised: 09/08/2017 Document Reviewed: 09/08/2017 Elsevier Patient Education  2020 Elsevier Inc.  

## 2020-03-27 DIAGNOSIS — R600 Localized edema: Secondary | ICD-10-CM | POA: Diagnosis not present

## 2020-03-27 DIAGNOSIS — E7800 Pure hypercholesterolemia, unspecified: Secondary | ICD-10-CM | POA: Diagnosis not present

## 2020-03-27 DIAGNOSIS — H9190 Unspecified hearing loss, unspecified ear: Secondary | ICD-10-CM | POA: Diagnosis not present

## 2020-03-27 DIAGNOSIS — E876 Hypokalemia: Secondary | ICD-10-CM | POA: Diagnosis not present

## 2020-03-27 DIAGNOSIS — F5104 Psychophysiologic insomnia: Secondary | ICD-10-CM | POA: Diagnosis not present

## 2020-03-27 DIAGNOSIS — E538 Deficiency of other specified B group vitamins: Secondary | ICD-10-CM | POA: Diagnosis not present

## 2020-03-27 DIAGNOSIS — M1990 Unspecified osteoarthritis, unspecified site: Secondary | ICD-10-CM | POA: Diagnosis not present

## 2020-03-27 DIAGNOSIS — I10 Essential (primary) hypertension: Secondary | ICD-10-CM | POA: Diagnosis not present

## 2020-03-27 DIAGNOSIS — C8339 Diffuse large B-cell lymphoma, extranodal and solid organ sites: Secondary | ICD-10-CM | POA: Diagnosis not present

## 2020-03-28 DIAGNOSIS — Z20822 Contact with and (suspected) exposure to covid-19: Secondary | ICD-10-CM | POA: Diagnosis not present

## 2020-04-01 ENCOUNTER — Inpatient Hospital Stay: Payer: Medicare HMO

## 2020-04-03 DIAGNOSIS — E538 Deficiency of other specified B group vitamins: Secondary | ICD-10-CM | POA: Diagnosis not present

## 2020-04-03 DIAGNOSIS — H9190 Unspecified hearing loss, unspecified ear: Secondary | ICD-10-CM | POA: Diagnosis not present

## 2020-04-03 DIAGNOSIS — G4733 Obstructive sleep apnea (adult) (pediatric): Secondary | ICD-10-CM | POA: Diagnosis not present

## 2020-04-03 DIAGNOSIS — I10 Essential (primary) hypertension: Secondary | ICD-10-CM | POA: Diagnosis not present

## 2020-04-03 DIAGNOSIS — Z0000 Encounter for general adult medical examination without abnormal findings: Secondary | ICD-10-CM | POA: Diagnosis not present

## 2020-04-03 DIAGNOSIS — E785 Hyperlipidemia, unspecified: Secondary | ICD-10-CM | POA: Diagnosis not present

## 2020-04-03 DIAGNOSIS — Z79899 Other long term (current) drug therapy: Secondary | ICD-10-CM | POA: Diagnosis not present

## 2020-04-04 ENCOUNTER — Other Ambulatory Visit: Payer: Self-pay | Admitting: Cardiovascular Disease

## 2020-06-03 ENCOUNTER — Other Ambulatory Visit: Payer: Self-pay

## 2020-06-03 ENCOUNTER — Ambulatory Visit (INDEPENDENT_AMBULATORY_CARE_PROVIDER_SITE_OTHER): Payer: Medicare HMO | Admitting: Vascular Surgery

## 2020-06-03 ENCOUNTER — Encounter (INDEPENDENT_AMBULATORY_CARE_PROVIDER_SITE_OTHER): Payer: Self-pay | Admitting: Vascular Surgery

## 2020-06-03 VITALS — BP 157/97 | HR 60 | Resp 16 | Wt 237.0 lb

## 2020-06-03 DIAGNOSIS — I890 Lymphedema, not elsewhere classified: Secondary | ICD-10-CM | POA: Diagnosis not present

## 2020-06-03 DIAGNOSIS — E782 Mixed hyperlipidemia: Secondary | ICD-10-CM | POA: Diagnosis not present

## 2020-06-03 DIAGNOSIS — I10 Essential (primary) hypertension: Secondary | ICD-10-CM | POA: Diagnosis not present

## 2020-06-03 DIAGNOSIS — C8518 Unspecified B-cell lymphoma, lymph nodes of multiple sites: Secondary | ICD-10-CM | POA: Diagnosis not present

## 2020-06-03 DIAGNOSIS — M7989 Other specified soft tissue disorders: Secondary | ICD-10-CM

## 2020-06-03 NOTE — Assessment & Plan Note (Signed)
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  He clearly would benefit from a lymphedema pump as an adjuvant therapy.  He had minimal venous disease on his reflux study.  He has been diligently wearing compression stockings.  We will try to get that obtained in the near future at his convenience.  I will plan to see him back in 6 months.

## 2020-06-03 NOTE — Progress Notes (Signed)
MRN : [id number]  John Gallegos is a 74 y.o. ([birth year]) male who presents with chief complaint of  Chief Complaint  Patient presents with  . Follow-up    6onth follow up  .  History of Present Illness: Patient returns today in follow up of his leg swelling and lymphedema.  His leg swelling is stable to slightly worse.  No major changes other than slightly worse swelling in the right leg.  We took his port out a little over 3 months ago and he has healed up fine and done well.  No new complaints  Current Outpatient Medications  Medication Sig Dispense Refill  . aspirin EC 81 MG tablet Take 81 mg by mouth daily.    . carvedilol (COREG) 12.5 MG tablet TAKE 1 TABLET(12.5 MG) BY MOUTH TWICE DAILY WITH A MEAL 180 tablet 0  . EPINEPHrine 0.3 mg/0.3 mL IJ SOAJ injection Inject 0.3 mg into the muscle as needed for anaphylaxis.   3  . gabapentin (NEURONTIN) 300 MG capsule Take 1 capsule (300 mg total) by mouth at bedtime. 90 capsule 1  . hydrALAZINE (APRESOLINE) 100 MG tablet Take 100 mg by mouth 2 (two) times daily.     Marland Kitchen losartan-hydrochlorothiazide (HYZAAR) 100-25 MG tablet Take 1 tablet by mouth daily.     . potassium chloride (KLOR-CON) 20 MEQ packet Take one tablet by mouth every Monday, Wednesday, Friday.    . pravastatin (PRAVACHOL) 40 MG tablet TAKE 1 TABLET BY MOUTH EVERY NIGHT    . zolpidem (AMBIEN) 10 MG tablet Take 10 mg by mouth at bedtime.     Marland Kitchen ezetimibe (ZETIA) 10 MG tablet Take 10 mg by mouth daily. (Patient not taking: Reported on 06/03/2020)    . ibuprofen (ADVIL) 800 MG tablet Take 1 tablet (800 mg total) by mouth every 8 (eight) hours as needed for mild pain or moderate pain. (Patient not taking: Reported on 06/03/2020) 30 tablet 0  . traMADol (ULTRAM) 50 MG tablet Take 50 mg by mouth every 6 (six) hours as needed for moderate pain. (Patient not taking: Reported on 06/03/2020)     No current facility-administered medications for this visit.    Past Medical  History:  Diagnosis Date  . Gout   . Hard of hearing   . Hiatal hernia   . Hypertension   . 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 Urinary incontinence    frequency    Past Surgical History:  Procedure Laterality Date  . CYSTOSCOPY W/ RETROGRADES Left 08/19/2017   Procedure: CYSTOSCOPY WITH RETROGRADE PYELOGRAM;  Surgeon: Abbie Sons, MD;  Location: [facility];  Service: Urology;  Laterality: Left;  . CYSTOSCOPY W/ URETERAL STENT REMOVAL Left 08/19/2017   Procedure: CYSTOSCOPY WITH STENT REMOVAL;  Surgeon: Abbie Sons, MD;  Location: [facility];  Service: Urology;  Laterality: Left;  . CYSTOSCOPY WITH BIOPSY Left 05/13/2017   Procedure: CYSTOSCOPY WITH URETERAL BIOPSY;  Surgeon: Nickie Retort, MD;  Location: [facility];  Service: Urology;  Laterality: Left;  . CYSTOSCOPY WITH STENT PLACEMENT Left 05/13/2017   Procedure: CYSTOSCOPY WITH STENT PLACEMENT;  Surgeon: Nickie Retort, MD;  Location: [facility];  Service: Urology;  Laterality: Left;  . PORTA CATH INSERTION N/A 06/14/2017   Procedure: PORTA CATH INSERTION;  Surgeon: Algernon Huxley, MD;  Location: [facility];  Service: Cardiovascular;  Laterality: N/A;  . PORTA CATH REMOVAL N/A 02/25/2020   Procedure:  PORTA CATH REMOVAL;  Surgeon: Algernon Huxley, MD;  Location: [facility];  Service: Cardiovascular;  Laterality: N/A;  . pyleostenosis     23 weeks old  . right knne surgery     needle went through knee  . TONSILLECTOMY    . URETEROSCOPY Left 05/13/2017   Procedure: URETEROSCOPY;  Surgeon: Nickie Retort, MD;  Location: [facility];  Service: Urology;  Laterality: Left;  . XI ROBOTIC ASSISTED VENTRAL HERNIA N/A 05/18/2019   Procedure: XI ROBOTIC ASSISTED UMBILICAL HERNIA;  Surgeon: Benjamine Sprague, DO;  Location: [facility];  Service: General;  Laterality: N/A;     Social History   Tobacco Use  . Smoking status: Former Smoker    Packs/day: 0.50    Years:  50.00    Pack years: 25.00    Types: Cigarettes    Quit date: 12/12/2016    Years since quitting: 3.4  . Smokeless tobacco: Never Used  Vaping Use  . Vaping Use: Never used  Substance Use Topics  . Alcohol use: Yes    Alcohol/week: 3.0 standard drinks    Types: 3 Shots of liquor per week  . Drug use: No     Family History  Problem Relation Age of Onset  . Pancreatic cancer Mother   . Aneurysm Mother   . Heart attack Mother   . Lung cancer Father   . Lymphoma Sister   . Prostate cancer Neg Hx   . Kidney cancer Neg Hx   . Bladder Cancer Neg Hx     Allergies  Allergen Reactions  . Amlodipine     Severe leg swelling    REVIEW OF SYSTEMS (Negative unless checked)  Constitutional: [] ?Weight loss  [] ?Fever  [] ?Chills Cardiac: [] ?Chest pain   [] ?Chest pressure   [] ?Palpitations   [] ?Shortness of breath when laying flat   [] ?Shortness of breath at rest   [] ?Shortness of breath with exertion. Vascular:  [] ?Pain in legs with walking   [] ?Pain in legs at rest   [] ?Pain in legs when laying flat   [] ?Claudication   [] ?Pain in feet when walking  [] ?Pain in feet at rest  [] ?Pain in feet when laying flat   [] ?History of DVT   [] ?Phlebitis   [x] ?Swelling in legs   [] ?Varicose veins   [] ?Non-healing ulcers Pulmonary:   [] ?Uses home oxygen   [] ?Productive cough   [] ?Hemoptysis   [] ?Wheeze  [] ?COPD   [] ?Asthma Neurologic:  [] ?Dizziness  [] ?Blackouts   [] ?Seizures   [] ?History of stroke   [] ?History of TIA  [] ?Aphasia   [] ?Temporary blindness   [] ?Dysphagia   [] ?Weakness or numbness in arms   [] ?Weakness or numbness in legs Musculoskeletal:  [x] ?Arthritis   [] ?Joint swelling   [x] ?Joint pain   [] ?Low back pain Hematologic:  [] ?Easy bruising  [] ?Easy bleeding   [] ?Hypercoagulable state   [] ?Anemic  [] ?Hepatitis Gastrointestinal:  [] ?Blood in stool   [] ?Vomiting blood  [] ?Gastroesophageal reflux/heartburn   [] ?Abdominal pain Genitourinary:  [] ?Chronic kidney disease   [] ?Difficult urination   [] ?Frequent urination  [] ?Burning with urination   [] ?Hematuria Skin:  [] ?Rashes   [] ?Ulcers   [] ?Wounds Psychological:  [] ?History of anxiety   [] ? History of major depression.   Physical Examination  BP (!) 157/97 (BP Location: Left Arm)   Pulse 60   Resp 16   Wt 237 lb (107.5 kg)   BMI 31.27 kg/m  Gen:  WD/WN, NAD Head: [place]/AT, No temporalis wasting. Ear/Nose/Throat: Hearing grossly intact, nares w/o erythema or drainage Eyes: Conjunctiva  clear. Sclera non-icteric Neck: Supple.  Trachea midline Pulmonary:  Good air movement, no use of accessory muscles.  Cardiac: RRR, no JVD Vascular:  Vessel Right Left  Radial Palpable Palpable                   Musculoskeletal: M/S 5/5 throughout.  No deformity or atrophy.  1-2+ right lower extremity edema, 1+ left lower extremity edema. Neurologic: Sensation grossly intact in extremities.  Symmetrical.  Speech is fluent.  Psychiatric: Judgment intact, Mood & affect appropriate for pt's clinical situation. Dermatologic: No rashes or ulcers noted.  No cellulitis or open wounds.  Previous port removal incision well-healed       Labs No results found for this or any previous visit (from the past 2160 hour(s)).  Radiology No results found.  Assessment/Plan Hypertension blood pressure control important in reducing the progression of atherosclerotic disease. On appropriate oral medications.   Hyperlipidemia, unspecified lipid control important in reducing the progression of atherosclerotic disease. 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 Was not using his port and I removed this about 3-4 months ago.  Swelling of limb Stable to slightly worse.   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  He clearly would benefit from a lymphedema pump as an adjuvant therapy.  He  had minimal venous disease on his reflux study.  He has been diligently wearing compression stockings.  We will try to get that obtained in the near future at his convenience.  I will plan to see him back in 6 months.    Leotis Pain, MD  06/03/2020 11:20 AM    This note was created with Dragon medical transcription system.  Any errors from dictation are purely unintentional

## 2020-07-16 NOTE — Progress Notes (Signed)
Cardiology Office Note  Date:  07/21/2020   ID:  John Gallegos, DOB [birth year], MRN [id number]  PCP:  Tracie Harrier, MD   Chief Complaint  Patient presents with  . Follow-up    6 month F/U; Meds verbally reviewed with patient.    HPI:  74 year old male with past medical history of Smoker, quit in 05/2017  HTN diffuse large B cell lymphoma (mala[place] summer 2018, blood in urine) CT scan 04/20/2017 which revealed moderate left hydronephrosis and delayed excretion down to a large 4.4 cmmass in or around the left proximal ureter. Significant native three-vessel coronary disease on CT scan Presents for follow-up of his chest pain  Stress last week, "bad week", details unavailable Was not taking losartan,  Was taking extra coreg, for unclear reasons  Off amlodipine secondary to swelling, swelling improved  Feels like blood pressure is running high consistently, does not have a blood pressure cuff at home Reports he has not been taking extra carvedilol past several days  Blood pressure typically elevated on most office visits  Reports he is not taking Zetia on today's visit He has been compliant with the pravastatin 40, total cholesterol 230 now down to 170  Reports he is not smoking No regular exercise program Denies any chest pain concerning for angina  EKG personally reviewed by myself on todays visit NSr rate 49 bpm nonspecific T wave ABn Unchanged from previous EKGs  History of lymphoma, has finished treatment Has neuropathy  Other past medical history reviewed Echo 06/07/2017 Normal EF 60%  previous imaging studies reviewed with him in detail  PET scan 05/18/2017  IMPRESSION: 1. 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  PSH:    Past Surgical History:  Procedure Laterality Date  . CYSTOSCOPY W/ RETROGRADES Left 08/19/2017   Procedure: CYSTOSCOPY WITH RETROGRADE PYELOGRAM;  Surgeon: Abbie Sons, MD;  Location: [facility];  Service: Urology;  Laterality: Left;  . CYSTOSCOPY W/ URETERAL STENT REMOVAL Left 08/19/2017   Procedure: CYSTOSCOPY WITH STENT REMOVAL;  Surgeon: Abbie Sons, MD;  Location: [facility];  Service: Urology;  Laterality: Left;  . CYSTOSCOPY WITH BIOPSY Left 05/13/2017   Procedure: CYSTOSCOPY WITH URETERAL BIOPSY;  Surgeon: Nickie Retort, MD;  Location: [facility];  Service: Urology;  Laterality: Left;  . CYSTOSCOPY WITH STENT PLACEMENT Left 05/13/2017   Procedure: CYSTOSCOPY WITH STENT PLACEMENT;  Surgeon: Nickie Retort, MD;  Location: [facility];  Service: Urology;  Laterality: Left;  . PORTA CATH INSERTION N/A 06/14/2017   Procedure: PORTA CATH INSERTION;  Surgeon: Algernon Huxley, MD;  Location: [facility];  Service: Cardiovascular;  Laterality: N/A;  . PORTA CATH REMOVAL N/A 02/25/2020   Procedure: PORTA CATH REMOVAL;  Surgeon: Algernon Huxley, MD;  Location: [facility];  Service: Cardiovascular;  Laterality: N/A;  . 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 right knne surgery     needle went through knee  . TONSILLECTOMY    . URETEROSCOPY Left 05/13/2017   Procedure: URETEROSCOPY;  Surgeon: Nickie Retort, MD;  Location: [facility];  Service: Urology;  Laterality: Left;  . XI ROBOTIC ASSISTED VENTRAL HERNIA N/A 05/18/2019   Procedure: XI ROBOTIC ASSISTED UMBILICAL HERNIA;  Surgeon: Benjamine Sprague, DO;  Location: [facility];  Service: General;  Laterality: N/A;    Current Outpatient Medications  Medication Sig Dispense Refill  . aspirin EC 81 MG tablet Take 81 mg by mouth daily.    Marland Kitchen EPINEPHrine 0.3 mg/0.3 mL IJ SOAJ injection Inject 0.3 mg into the muscle as needed for anaphylaxis.    3  . etodolac (LODINE) 500 MG tablet Take 500 mg by mouth as needed.    . gabapentin (NEURONTIN) 300 MG capsule Take 1 capsule (300 mg total) by mouth at bedtime. 90 capsule 1  . ibuprofen (ADVIL) 800 MG tablet Take 1 tablet (800 mg total) by mouth every 8 (eight) hours as needed for mild pain or moderate pain. 30 tablet 0  . losartan-hydrochlorothiazide (HYZAAR) 100-25 MG tablet Take 1 tablet by mouth daily.     . potassium chloride (KLOR-CON) 20 MEQ packet Take one tablet by mouth every Monday, Wednesday, Friday.    . pravastatin (PRAVACHOL) 40 MG tablet TAKE 1 TABLET BY MOUTH EVERY NIGHT    . zolpidem (AMBIEN) 10 MG tablet Take 10 mg by mouth at bedtime.    . carvedilol (COREG) 12.5 MG tablet Take 0.5 tablets (6.25 mg total) by mouth 2 (two) times daily. 180 tablet 3  . doxazosin (CARDURA) 2 MG tablet Take 1 tablet (2 mg total) by mouth 2 (two) times daily. 180 tablet 3  . hydrALAZINE (APRESOLINE) 100 MG tablet Take 1 tablet (100 mg total) by mouth 3 (three) times daily as needed. 180 tablet 3   No current facility-administered medications for this visit.    Allergies:   Amlodipine   Social History:  The patient  reports that he quit smoking about 3 years ago. His smoking use included cigarettes. He has a 25.00 pack-year smoking history. He has never used smokeless tobacco. He reports current alcohol use of about 2.0 standard drinks of alcohol per week. He reports that he does not use drugs.   Family History:   family history includes Aneurysm in his mother; Heart attack in his mother; Lung cancer in his father; Lymphoma in his sister; Pancreatic cancer in his mother.    Review of Systems: Review of Systems  Constitutional: Negative.   HENT: Negative.   Respiratory: Negative.   Cardiovascular: Negative.   Gastrointestinal: Negative.   Musculoskeletal: Negative.   Neurological: Negative.   Psychiatric/Behavioral: Negative.   All other systems reviewed and are negative.   PHYSICAL  EXAM: VS:  BP (!) 150/100 (BP Location: Left Arm, Patient Position: Sitting, Cuff Size: Normal)   Pulse (!) 49   Ht 6' (1.829 m)   Wt 233 lb (105.7 kg)   SpO2 96%   BMI 31.60 kg/m  , BMI Body mass index is 31.6 kg/m. Constitutional:  oriented to person, place, and time. No distress.  HENT:  Head: Grossly normal Eyes:  no discharge. No scleral icterus.  Neck: No JVD, no carotid bruits  Cardiovascular: Regular rate and rhythm, no murmurs appreciated Pulmonary/Chest: Clear to auscultation bilaterally, no wheezes or rails Abdominal: Soft.  no distension.  no tenderness.  Musculoskeletal: Normal range of motion Neurological:  normal muscle tone. Coordination normal. No atrophy Skin: Skin warm and dry Psychiatric: normal affect, pleasant   Recent Labs: 02/18/2020: ALT 12; BUN 15; Creatinine, Ser 0.99; Hemoglobin 13.2; Platelets 168; Potassium 3.6; Sodium 139    Lipid Panel No results found for: CHOL, HDL, LDLCALC, TRIG    Wt Readings from Last 3 Encounters:  07/21/20 233 lb (  105.7 kg)  06/03/20 237 lb (107.5 kg)  02/25/20 230 lb (104.3 kg)     ASSESSMENT AND PLAN:  Diffuse large B-cell lymphoma of extranodal site excluding spleen and other solid organs (HCC) Completed RCHOP Followed by oncology  Mixed hyperlipidemia Stressed compliance Pravastatin , total cholesterol 170s  he does not want to take Zetia at this time  Essential hypertension Has bradycardia today, will decrease Coreg down to 6.25 mg twice daily Recommend he avoid extra dosing Continue losartan HCTZ Blood pressure consistently high Only taking hydralazine in the evening not as directed Recommend he take hydralazine as needed We will start Cardura 2 mg twice daily Avoid calcium channel blockers given prior leg swelling  Chest pain with moderate risk for cardiac etiology Denies anginal symptoms As prior risk factors  Smoker Stop smoking >3 years Continued smoking cessation  Copd Reports  breathing is stable, currently not on inhalers   Total encounter time more than 25 minutes  Greater than 50% was spent in counseling and coordination of care with the patient   Orders Placed This Encounter  Procedures  . EKG 12-Lead     Signed, Esmond Plants, M.D., Ph.D. 07/21/2020  [place], [place]

## 2020-07-21 ENCOUNTER — Other Ambulatory Visit: Payer: Self-pay

## 2020-07-21 ENCOUNTER — Ambulatory Visit: Payer: Medicare HMO | Admitting: Cardiovascular Disease

## 2020-07-21 ENCOUNTER — Encounter: Payer: Self-pay | Admitting: Cardiovascular Disease

## 2020-07-21 VITALS — BP 150/100 | HR 49 | Ht 72.0 in | Wt 233.0 lb

## 2020-07-21 DIAGNOSIS — I25118 Atherosclerotic heart disease of native coronary artery with other forms of angina pectoris: Secondary | ICD-10-CM

## 2020-07-21 DIAGNOSIS — Z87891 Personal history of nicotine dependence: Secondary | ICD-10-CM

## 2020-07-21 DIAGNOSIS — I10 Essential (primary) hypertension: Secondary | ICD-10-CM | POA: Diagnosis not present

## 2020-07-21 DIAGNOSIS — C8518 Unspecified B-cell lymphoma, lymph nodes of multiple sites: Secondary | ICD-10-CM | POA: Diagnosis not present

## 2020-07-21 DIAGNOSIS — C8300 Small cell B-cell lymphoma, unspecified site: Secondary | ICD-10-CM

## 2020-07-21 MED ORDER — DOXAZOSIN MESYLATE 2 MG PO TABS
2.0000 mg | ORAL_TABLET | Freq: Two times a day (BID) | ORAL | 3 refills | Status: DC
Start: 2020-07-21 — End: 2021-07-20

## 2020-07-21 MED ORDER — HYDRALAZINE HCL 100 MG PO TABS: 100.0000 mg | ORAL_TABLET | Freq: Three times a day (TID) | ORAL | 3 refills | Status: DC | PRN

## 2020-07-21 MED ORDER — CARVEDILOL 12.5 MG PO TABS: 6.2500 mg | ORAL_TABLET | Freq: Two times a day (BID) | ORAL | 3 refills | Status: DC

## 2020-07-21 NOTE — Patient Instructions (Addendum)
Medication Instructions:  We have decrease the Coreg down to 6.25 mg two times a day (Break the 12.5 mg pills (that you currently have) in 1/2 two times a day, when you run out the correct dosing has been order and sent to your pharmacy  Start cardura/doxazosin 2 mg two times a day, has been sent to your pharmacy  Take hydralazine 100 mg up to three times a day as needed, You may take this medication in the evening if you prefer.    If you need a refill on your cardiac medications before your next appointment, please call your pharmacy.    Lab work: No new labs needed   If you have labs (blood work) drawn today and your tests are completely normal, you will receive your results only by:  [place] (if you have MyChart) OR  A paper copy in the mail If you have any lab test that is abnormal or we need to change your treatment, we will call you to review the results.   Testing/Procedures: No new testing needed   Follow-Up: At [facility], you and your health needs are our priority.  As part of our continuing mission to provide you with exceptional heart care, we have created designated Provider Care Teams.  These Care Teams include your primary Cardiologist (physician) and Advanced Practice Providers (APPs -  Physician Assistants and Nurse Practitioners) who all work together to provide you with the care you need, when you need it.   You will need a follow up appointment in 6 months   Providers on your designated Care Team:    Murray Hodgkins, NP  Christell Faith, PA-C  Marrianne Mood, PA-C  Any Other Special Instructions Will Be Listed Below (If Applicable).  COVID-19 Vaccine Information can be found at: [email or web address].uk For questions related to vaccine distribution or appointments, please email [email or web address] or call [phone].

## 2020-08-20 ENCOUNTER — Encounter: Payer: Self-pay | Admitting: Oncology

## 2020-08-20 ENCOUNTER — Inpatient Hospital Stay (HOSPITAL_BASED_OUTPATIENT_CLINIC_OR_DEPARTMENT_OTHER): Payer: Medicare HMO | Admitting: Oncology

## 2020-08-20 ENCOUNTER — Inpatient Hospital Stay: Payer: Medicare HMO | Attending: Oncology

## 2020-08-20 VITALS — BP 152/77 | HR 56 | Temp 97.2°F | Resp 18 | Wt 233.0 lb

## 2020-08-20 DIAGNOSIS — C8339 Diffuse large B-cell lymphoma, extranodal and solid organ sites: Secondary | ICD-10-CM | POA: Insufficient documentation

## 2020-08-20 DIAGNOSIS — M109 Gout, unspecified: Secondary | ICD-10-CM | POA: Diagnosis not present

## 2020-08-20 DIAGNOSIS — Z95828 Presence of other vascular implants and grafts: Secondary | ICD-10-CM

## 2020-08-20 DIAGNOSIS — G629 Polyneuropathy, unspecified: Secondary | ICD-10-CM

## 2020-08-20 DIAGNOSIS — I10 Essential (primary) hypertension: Secondary | ICD-10-CM | POA: Diagnosis not present

## 2020-08-20 DIAGNOSIS — F17210 Nicotine dependence, cigarettes, uncomplicated: Secondary | ICD-10-CM | POA: Insufficient documentation

## 2020-08-20 DIAGNOSIS — R3129 Other microscopic hematuria: Secondary | ICD-10-CM | POA: Insufficient documentation

## 2020-08-20 DIAGNOSIS — C83398 Diffuse large b-cell lymphoma of other extranodal and solid organ sites: Secondary | ICD-10-CM

## 2020-08-20 DIAGNOSIS — M549 Dorsalgia, unspecified: Secondary | ICD-10-CM | POA: Insufficient documentation

## 2020-08-20 DIAGNOSIS — Z7982 Long term (current) use of aspirin: Secondary | ICD-10-CM | POA: Insufficient documentation

## 2020-08-20 DIAGNOSIS — G8929 Other chronic pain: Secondary | ICD-10-CM | POA: Insufficient documentation

## 2020-08-20 DIAGNOSIS — Z79899 Other long term (current) drug therapy: Secondary | ICD-10-CM | POA: Insufficient documentation

## 2020-08-20 LAB — CBC WITH DIFFERENTIAL/PLATELET
Abs Immature Granulocytes: 0.01 10*3/uL (ref 0.00–0.07)
Basophils Absolute: 0 10*3/uL (ref 0.0–0.1)
Basophils Relative: 1 %
Eosinophils Absolute: 0.2 10*3/uL (ref 0.0–0.5)
Eosinophils Relative: 3 %
HCT: 39.8 % (ref 39.0–52.0)
Hemoglobin: 13.5 g/dL (ref 13.0–17.0)
Immature Granulocytes: 0 %
Lymphocytes Relative: 11 %
Lymphs Abs: 0.6 10*3/uL — ABNORMAL LOW (ref 0.7–4.0)
MCH: 30.1 pg (ref 26.0–34.0)
MCHC: 33.9 g/dL (ref 30.0–36.0)
MCV: 88.8 fL (ref 80.0–100.0)
Monocytes Absolute: 0.6 10*3/uL (ref 0.1–1.0)
Monocytes Relative: 9 %
Neutro Abs: 4.6 10*3/uL (ref 1.7–7.7)
Neutrophils Relative %: 76 %
Platelets: 169 10*3/uL (ref 150–400)
RBC: 4.48 MIL/uL (ref 4.22–5.81)
RDW: 13 % (ref 11.5–15.5)
WBC: 6 10*3/uL (ref 4.0–10.5)
nRBC: 0 % (ref 0.0–0.2)

## 2020-08-20 LAB — COMPREHENSIVE METABOLIC PANEL
ALT: 14 U/L (ref 0–44)
AST: 17 U/L (ref 15–41)
Albumin: 3.7 g/dL (ref 3.5–5.0)
Alkaline Phosphatase: 83 U/L (ref 38–126)
Anion gap: 6 (ref 5–15)
BUN: 17 mg/dL (ref 8–23)
CO2: 28 mmol/L (ref 22–32)
Calcium: 8.6 mg/dL — ABNORMAL LOW (ref 8.9–10.3)
Chloride: 103 mmol/L (ref 98–111)
Creatinine, Ser: 0.93 mg/dL (ref 0.61–1.24)
GFR, Estimated: >60 mL/min (ref >60)
Glucose, Bld: 107 mg/dL — ABNORMAL HIGH (ref 70–99)
Potassium: 3.5 mmol/L (ref 3.5–5.1)
Sodium: 137 mmol/L (ref 135–145)
Total Bilirubin: 0.6 mg/dL (ref 0.3–1.2)
Total Protein: 7 g/dL (ref 6.5–8.1)

## 2020-08-20 LAB — LACTATE DEHYDROGENASE: LDH: 134 U/L (ref 98–192)

## 2020-08-20 MED ORDER — GABAPENTIN 300 MG PO CAPS: 300.0000 mg | ORAL_CAPSULE | Freq: Every day | ORAL | 1 refills | Status: DC

## 2020-08-20 NOTE — Progress Notes (Signed)
Hematology/Oncology follow-up note [facility] Telephone:(336) [phone] Fax:(336) [phone]   Patient Care Team: Tracie Harrier, MD as PCP - General (Internal Medicine) John Server, MD as Consulting Physician (Oncology) Lucky Cowboy Erskine Squibb, MD as Referring Physician (Vascular Surgery) Abbie Sons, MD (Urology)  REFERRING PROVIDER: Tracie Harrier, MD  REASON FOR VISIT Follow up for surveillance for diffuse large B-cell lymphoma.  HISTORY OF PRESENTING ILLNESS:  John Gallegos is a  75 y.o.  male with PMH listed below presented for follow-up for treatment of diffuse large B cell lymphoma.  Pertinent history of oncology workup includes: Patient was found to have microscopic hematuria on 02/23/2017 with 4-10 RBC's/hpf.    Urine culture was negative.having symptoms of nocturia He is smoker. 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 He was in the [employer] and reports to be exposed to [place] in [place]. CT scan 04/20/2017 which revealed moderate left hydronephrosis and delayed excretion down to a large 4.4 cm mass in or around the left proximal ureter. There was also hazy soft tissue in the fat surrounding the celiac axis and superior mesenteric artery. Thickening of the right diaphragmatic crus. High gastrohepatic ligament lymphadenopathy measures up to 11 mm. Peripancreatic and root of small bowel mesentery adenopathy measures up to 2.6 cm.  He was seen by urologist Dr. Pilar Jarvis and he underwent cystoscopy, left retrograde pyelogram, left ureteroscopy and placement of left ureteral stent placement on 05/13/2017. Finding during the procedure that he has extrinsic compression of the ureter, possibly from lymphoma.  # Pathology:  Biopsy of paraspinal musculature at L2. Pathology results reviewed large B cell lymphoma. It is CD20 positive, CD10 positive. Flow cytometry shows a small clonal population of large B cells positive for CD10 and absent cop and lambda light  chains. The abnormal cells represent 2% of the total cells.The morphology, initial IHC panel, and flow cytometry supports classification as B-cell lymphoma, CD10 positive, with large cell features.diffuse large B-cell lymphoma, germinal center B-cell type. Discussed with pathologist Dr.Onley, Ki67 50%, MUM1 negative, MYC negative, Cycline D1 negative, CD30 negative, FISH is positive for BCL2 gene rearrangement. Negative for MYC and BCL6. 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   # Bone marrow Biopsy: negative for lymphoma involvement.  # PET scan 05/18/2017  IMPRESSION: 1. Multifocal metastatic disease within the LEFT retroperitoneum, central mesenteries, and RIGHT paraspinal musculature. 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 Recommend percutaneous biopsy of the RIGHT paraspinal musculature at L2. 2. 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 # Interim PET scan 07/28/2017 after 2 cycles of RCHOP 1. Marked improvement, with row solution of the prior bony lesions and significant reduction in activity of the central mesenteric and right gastric adenopathy. Central mesenteric nodal is currently Deauville 3, previously Deauville 4 and Deauville 5. 2. Improvement in the right paraspinal muscular activity, currently Deauville 3, previously Deauville 4. I also discussed with radiologist that there has been further improvement in the left periureteral density, likely reflecting treatment response.   # Prognostic Model for the risk of CNS lymphoma involvement per NCCN guideline,  Patient is intermediate risk with score of 2, one from being >60, and one from more than extranodal involvement>1 sites (bone, paraspinal muscle involvement). CNS prophylaxis is usually recommended if score 4-6.  CNS prophylaxis was not offered.    #08/04/2018 CT abdomen pelvis showed no evidence of acute abnormality.  Stable stranding haziness at the root of  mesentery  and retroperitoneum.  No new or enlarged lymph nodes identified.  Small to moderate umbilical hernia containing fat, moderate right lower lobe atelectasis and a small loculated right pleural effusion again noted.  Cholelithiasis without CT evidence of acute cholecystitis.  Prostate enlargement.  Aortic atherosclerosis.    # Treatment:  06/21/2017 Cycle 1 RCHOP [name]. Dexamethasone 43m on Day 1,  Prednisone 80 mg daily Day 2-5. 07/12/2017 Cycle 2 RCHOP /[name].Dexamethasone 261mon Day 1, Prednisone 80 mg daily Day 2-5 08/03/2017 Cycle 3 RCHOP/onpro.Dexamethasone 2048mn Day 1, Prednisone 80 mg daily Day 2-5  08/24/2017 Cycle 4 RCHOP/onpro.Dexamethasone 53m10m Day 1, Prednisone 80 mg daily Day 2-5  09/14/2017 Cycle 5 RCHOP/onpro.Dexamethasone 53mg22mDay 1, Prednisone 80 mg daily Day 2-5  10/05/2017 Cycle 6 RCHOP/onpro.Dexamethasone 53mg 38may 1, Prednisone 80 mg daily Day 2-5   # Chronic back pain, previous MRI showed diffuse lumbar spine spondylosis. INTERVAL HISTORY 74 y.o74male with oncology history listed as above reviewed by me today presents for follow-up for diffuse large B-cell lymphoma. Patient reports feeling well.  He has no new complaints. Mediport was removed on 02/25/2020.  Denies any constitutional symptoms. Chronic back pain, balance problem.  Unchanged.  Review of Systems  Constitutional: Positive for fatigue. Negative for appetite change, chills, fever and unexpected weight change.  HENT:   Negative for hearing loss and voice change.   Eyes: Negative for eye problems and icterus.  Respiratory: Negative for chest tightness, cough and shortness of breath.   Cardiovascular: Negative for chest pain and leg swelling.  Gastrointestinal: Negative for abdominal distention and abdominal pain.  Endocrine: Negative for hot flashes.  Genitourinary: Negative for difficulty urinating, dysuria and frequency.   Musculoskeletal: Positive for back pain. Negative for arthralgias.   Skin: Negative for itching and rash.  Neurological: Negative for light-headedness and numbness.  Hematological: Negative for adenopathy. Does not bruise/bleed easily.  Psychiatric/Behavioral: Negative for confusion.     MEDICAL HISTORY:  Past Medical History:  Diagnosis Date  . Gout   . Hard of hearing   . Hiatal hernia   . Hypertension   . Non Hodgkin's lymphoma (HCC) [id number]   B-cell, chemo tx's and in remission in 2019  . Urinary incontinence    frequency    SURGICAL HISTORY: Past Surgical History:  Procedure Laterality Date  . CYSTOSCOPY W/ RETROGRADES Left 08/19/2017   Procedure: CYSTOSCOPY WITH RETROGRADE PYELOGRAM;  Surgeon: StoiofAbbie Sons Location: [facility];  Service: Urology;  Laterality: Left;  . CYSTOSCOPY W/ URETERAL STENT REMOVAL Left 08/19/2017   Procedure: CYSTOSCOPY WITH STENT REMOVAL;  Surgeon: StoiofAbbie Sons Location: [facility];  Service: Urology;  Laterality: Left;  . CYSTOSCOPY WITH BIOPSY Left 05/13/2017   Procedure: CYSTOSCOPY WITH URETERAL BIOPSY;  Surgeon: BudzynNickie Retort Location: [facility];  Service: Urology;  Laterality: Left;  . CYSTOSCOPY WITH STENT PLACEMENT Left 05/13/2017   Procedure: CYSTOSCOPY WITH STENT PLACEMENT;  Surgeon: BudzynNickie Retort Location: [facility];  Service: Urology;  Laterality: Left;  . PORTA CATH INSERTION N/A 06/14/2017   Procedure: PORTA CATH INSERTION;  Surgeon: Dew, JAlgernon Huxley [facility];  Service: Cardiovascular;  Laterality: N/A;  . PORTA CATH REMOVAL N/A 02/25/2020   Procedure: PORTA CATH REMOVAL;  Surgeon: Dew, JAlgernon Huxley [facility];  Service: Cardiovascular;  Laterality: N/A;  . 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 right knne surgery  needle went through knee  . TONSILLECTOMY    . URETEROSCOPY Left 05/13/2017   Procedure: URETEROSCOPY;  Surgeon: Nickie Retort, MD;  Location: [facility];  Service: Urology;  Laterality: Left;  . XI ROBOTIC  ASSISTED VENTRAL HERNIA N/A 05/18/2019   Procedure: XI ROBOTIC ASSISTED UMBILICAL HERNIA;  Surgeon: Benjamine Sprague, DO;  Location: [facility];  Service: General;  Laterality: N/A;    SOCIAL HISTORY: Social History   Socioeconomic History  . Marital status: Married    Spouse name: Not on file  . Number of children: Not on file  . Years of education: Not on file  . Highest education level: Not on file  Occupational History  . Not on file  Tobacco Use  . Smoking status: Former Smoker    Packs/day: 0.50    Years: 50.00    Pack years: 25.00    Types: Cigarettes    Quit date: 12/12/2016    Years since quitting: 3.6  . Smokeless tobacco: Never Used  Vaping Use  . Vaping Use: Never used  Substance and Sexual Activity  . Alcohol use: Yes    Alcohol/week: 2.0 standard drinks    Types: 2 Shots of liquor per week    Comment: vodka and cranberry juice-one drik daily  . Drug use: No  . Sexual activity: Not on file  Other Topics Concern  . Not on file  Social History Narrative  . Not on file   Social Determinants of Health   Financial Resource Strain: Not on file  Food Insecurity: Not on file  Transportation Needs: Not on file  Physical Activity: Not on file  Stress: Not on file  Social Connections: Not on file  Intimate Partner Violence: Not on file    FAMILY HISTORY: Family History  Problem Relation Age of Onset  . Pancreatic cancer Mother   . Aneurysm Mother   . Heart attack Mother   . Lung cancer Father   . Lymphoma Sister   . Prostate cancer Neg Hx   . Kidney cancer Neg Hx   . Bladder Cancer Neg Hx     ALLERGIES:  is allergic to amlodipine.  MEDICATIONS:  Current Outpatient Medications  Medication Sig Dispense Refill  . aspirin EC 81 MG tablet Take 81 mg by mouth daily.    . carvedilol (COREG) 12.5 MG tablet Take 0.5 tablets (6.25 mg total) by mouth 2 (two) times daily. 180 tablet 3  . doxazosin (CARDURA) 2 MG tablet Take 1 tablet (2 mg total) by mouth 2 (two)  times daily. 180 tablet 3  . EPINEPHrine 0.3 mg/0.3 mL IJ SOAJ injection Inject 0.3 mg into the muscle as needed for anaphylaxis.   3  . etodolac (LODINE) 500 MG tablet Take 500 mg by mouth as needed.    . gabapentin (NEURONTIN) 300 MG capsule Take 1 capsule (300 mg total) by mouth at bedtime. 90 capsule 1  . hydrALAZINE (APRESOLINE) 100 MG tablet Take 1 tablet (100 mg total) by mouth 3 (three) times daily as needed. 180 tablet 3  . ibuprofen (ADVIL) 800 MG tablet Take 1 tablet (800 mg total) by mouth every 8 (eight) hours as needed for mild pain or moderate pain. 30 tablet 0  . losartan-hydrochlorothiazide (HYZAAR) 100-25 MG tablet Take 1 tablet by mouth daily.     . potassium chloride (KLOR-CON) 20 MEQ packet Take one tablet by mouth every Monday, Wednesday, Friday.    . pravastatin (PRAVACHOL) 40 MG tablet TAKE 1 TABLET BY MOUTH EVERY  NIGHT    . zolpidem (AMBIEN) 10 MG tablet Take 10 mg by mouth at bedtime.     No current facility-administered medications for this visit.      Marland Kitchen  PHYSICAL EXAMINATION: ECOG PERFORMANCE STATUS: 0 - Asymptomatic Vitals:   08/20/20 1311  BP: (!) 152/77  Pulse: (!) 56  Resp: 18  Temp: (!) 97.2 F (36.2 C)   Filed Weights   08/20/20 1311  Weight: 233 lb (105.7 kg)    Physical Exam Constitutional:      General: He is not in acute distress.    Appearance: He is not diaphoretic.  HENT:     Head: Normocephalic and atraumatic.     Nose: Nose normal.     Mouth/Throat:     Pharynx: No oropharyngeal exudate.  Eyes:     General: No scleral icterus.       Right eye: No discharge.        Left eye: No discharge.     Conjunctiva/sclera: Conjunctivae normal.     Pupils: Pupils are equal, round, and reactive to light.  Neck:     Vascular: No JVD.  Cardiovascular:     Rate and Rhythm: Normal rate and regular rhythm.     Heart sounds: Normal heart sounds. No murmur heard. No gallop.   Pulmonary:     Effort: Pulmonary effort is normal. No respiratory  distress.     Breath sounds: Normal breath sounds. No wheezing or rales.  Chest:     Chest wall: No tenderness.  Abdominal:     General: Bowel sounds are normal. There is no distension.     Palpations: Abdomen is soft. There is no mass.     Tenderness: There is no abdominal tenderness. There is no rebound.  Musculoskeletal:        General: No tenderness or deformity. Normal range of motion.     Cervical back: Normal range of motion and neck supple.  Lymphadenopathy:     Cervical: No cervical adenopathy.  Skin:    General: Skin is warm and dry.     Findings: No erythema or rash.     Comments: Scattered chronic skin pigmentation Skin tag  Neurological:     Mental Status: He is alert and oriented to person, place, and time.     Cranial Nerves: No cranial nerve deficit.     Motor: No abnormal muscle tone.     Coordination: Coordination normal.     Gait: Gait is intact.  Psychiatric:        Mood and Affect: Affect normal.        Judgment: Judgment normal.     LABORATORY DATA:  I have reviewed the data as listed Lab Results  Component Value Date   WBC 6.0 08/20/2020   HGB 13.5 08/20/2020   HCT 39.8 08/20/2020   MCV 88.8 08/20/2020   PLT 169 08/20/2020   Recent Labs    02/18/20 1216 08/20/20 1237  NA 139 137  K 3.6 3.5  CL 103 103  CO2 28 28  GLUCOSE 110* 107*  BUN 15 17  CREATININE 0.99 0.93  CALCIUM 8.9 8.6*  GFRNONAA >60 >60  GFRAA >60  --   PROT 7.4 7.0  ALBUMIN 3.7 3.7  AST 17 17  ALT 12 14  ALKPHOS 114 83  BILITOT 0.7 0.6   Hepatitis B antigen negative. 2-D echo.showed LVEF 60%  RADIOGRAPHIC STUDIES: I have personally reviewed the radiological images as listed and agreed with the  findings in the report. 08/04/2018 CT abdomen and pelvis w contrast 1. No evidence of acute abnormality. 2. Stable stranding/haziness at the root of the mesentery and retroperitoneum. No new or enlarged lymph nodes identified. 3. Small to moderate umbilical hernia  containing fat, moderate RIGHT LOWER lobe atelectasis and small loculated RIGHT pleural effusion again noted. 4. Cholelithiasis without CT evidence of acute cholecystitis. 5. Prostate enlargement 6.  Aortic Atherosclerosis (ICD10-I70.0).   ASSESSMENT & PLAN:  This is a 75 y.o. male with stage IV diffuse large B-cell lymphoma, s/p first-line chemotherapy with R CHOP with curative intent, present for follow-up.  1. Diffuse large B-cell lymphoma of extranodal site excluding spleen and other solid organs ([place])   2. Neuropathy    # DLBCL, clinically doing well, in complete remission.  It has been 3 years after he finishes treatment Labs are reviewed and discussed with patient.  Normal LDH.  UA is pending. Recommend continue patient physical every 6 to 12 months. Imaging as clinically indicated.  Report has been reviewed.  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  Stable symptoms continue gabapentin 300 mg nightly.  Refill sent to pharmacy.  return of visit: lab (cbc, cmp, LDH, ua) /MD in 6 months.  Orders Placed This Encounter  Procedures  . Urinalysis, Complete w Microscopic    Standing Status:   Future    Standing Expiration Date:   08/20/2021  . CBC with Differential/Platelet    Standing Status:   Future    Standing Expiration Date:   08/20/2021  . Comprehensive metabolic panel    Standing Status:   Future    Standing Expiration Date:   08/20/2021  . Lactate dehydrogenase    Standing Status:   Future    Standing Expiration Date:   08/20/2021   John Server, MD, PhD Hematology Oncology [facility] at [facility] Pager- [phone] 08/20/20

## 2020-08-20 NOTE — Progress Notes (Signed)
PT here for follow up. Requesting refill on Gabapentin.

## 2020-09-02 DIAGNOSIS — Z20822 Contact with and (suspected) exposure to covid-19: Secondary | ICD-10-CM | POA: Diagnosis not present

## 2020-09-04 DIAGNOSIS — I10 Essential (primary) hypertension: Secondary | ICD-10-CM | POA: Diagnosis not present

## 2020-09-04 DIAGNOSIS — E7800 Pure hypercholesterolemia, unspecified: Secondary | ICD-10-CM | POA: Diagnosis not present

## 2020-09-04 DIAGNOSIS — H9190 Unspecified hearing loss, unspecified ear: Secondary | ICD-10-CM | POA: Diagnosis not present

## 2020-09-04 DIAGNOSIS — C8339 Diffuse large B-cell lymphoma, extranodal and solid organ sites: Secondary | ICD-10-CM | POA: Diagnosis not present

## 2020-09-04 DIAGNOSIS — E538 Deficiency of other specified B group vitamins: Secondary | ICD-10-CM | POA: Diagnosis not present

## 2020-09-04 DIAGNOSIS — Z0000 Encounter for general adult medical examination without abnormal findings: Secondary | ICD-10-CM | POA: Diagnosis not present

## 2020-09-04 DIAGNOSIS — F5104 Psychophysiologic insomnia: Secondary | ICD-10-CM | POA: Diagnosis not present

## 2020-09-11 ENCOUNTER — Emergency Department: Payer: Medicare HMO

## 2020-09-11 ENCOUNTER — Other Ambulatory Visit: Payer: Self-pay

## 2020-09-11 ENCOUNTER — Encounter: Payer: Self-pay | Admitting: *Deleted

## 2020-09-11 ENCOUNTER — Inpatient Hospital Stay
Admission: EM | Admit: 2020-09-11 | Discharge: 2020-09-16 | DRG: 178 | Disposition: A | Discharge status: Home / Self Care | Payer: Medicare HMO | Attending: Family Medicine | Admitting: Family Medicine

## 2020-09-11 DIAGNOSIS — R531 Weakness: Secondary | ICD-10-CM

## 2020-09-11 DIAGNOSIS — E876 Hypokalemia: Secondary | ICD-10-CM | POA: Diagnosis not present

## 2020-09-11 DIAGNOSIS — E860 Dehydration: Secondary | ICD-10-CM | POA: Diagnosis not present

## 2020-09-11 DIAGNOSIS — Z888 Allergy status to other drugs, medicaments and biological substances status: Secondary | ICD-10-CM

## 2020-09-11 DIAGNOSIS — U071 COVID-19: Secondary | ICD-10-CM | POA: Diagnosis not present

## 2020-09-11 DIAGNOSIS — K5730 Diverticulosis of large intestine without perforation or abscess without bleeding: Secondary | ICD-10-CM | POA: Diagnosis not present

## 2020-09-11 DIAGNOSIS — T508X5A Adverse effect of diagnostic agents, initial encounter: Secondary | ICD-10-CM | POA: Diagnosis not present

## 2020-09-11 DIAGNOSIS — A0839 Other viral enteritis: Secondary | ICD-10-CM | POA: Diagnosis not present

## 2020-09-11 DIAGNOSIS — Z79899 Other long term (current) drug therapy: Secondary | ICD-10-CM

## 2020-09-11 DIAGNOSIS — R55 Syncope and collapse: Secondary | ICD-10-CM | POA: Diagnosis not present

## 2020-09-11 DIAGNOSIS — R778 Other specified abnormalities of plasma proteins: Secondary | ICD-10-CM

## 2020-09-11 DIAGNOSIS — R319 Hematuria, unspecified: Secondary | ICD-10-CM | POA: Diagnosis not present

## 2020-09-11 DIAGNOSIS — I10 Essential (primary) hypertension: Secondary | ICD-10-CM | POA: Diagnosis not present

## 2020-09-11 DIAGNOSIS — Z807 Family history of other malignant neoplasms of lymphoid, hematopoietic and related tissues: Secondary | ICD-10-CM

## 2020-09-11 DIAGNOSIS — K449 Diaphragmatic hernia without obstruction or gangrene: Secondary | ICD-10-CM

## 2020-09-11 DIAGNOSIS — N141 Nephropathy induced by other drugs, medicaments and biological substances: Secondary | ICD-10-CM | POA: Diagnosis not present

## 2020-09-11 DIAGNOSIS — Z6830 Body mass index (BMI) 30.0-30.9, adult: Secondary | ICD-10-CM

## 2020-09-11 DIAGNOSIS — R4182 Altered mental status, unspecified: Secondary | ICD-10-CM | POA: Diagnosis not present

## 2020-09-11 DIAGNOSIS — I25118 Atherosclerotic heart disease of native coronary artery with other forms of angina pectoris: Secondary | ICD-10-CM | POA: Diagnosis not present

## 2020-09-11 DIAGNOSIS — Z801 Family history of malignant neoplasm of trachea, bronchus and lung: Secondary | ICD-10-CM

## 2020-09-11 DIAGNOSIS — E46 Unspecified protein-calorie malnutrition: Secondary | ICD-10-CM | POA: Diagnosis present

## 2020-09-11 DIAGNOSIS — N281 Cyst of kidney, acquired: Secondary | ICD-10-CM | POA: Diagnosis not present

## 2020-09-11 DIAGNOSIS — I2510 Atherosclerotic heart disease of native coronary artery without angina pectoris: Secondary | ICD-10-CM | POA: Diagnosis not present

## 2020-09-11 DIAGNOSIS — Z800 Family history of malignant neoplasm of digestive organs: Secondary | ICD-10-CM | POA: Diagnosis not present

## 2020-09-11 DIAGNOSIS — N179 Acute kidney failure, unspecified: Secondary | ICD-10-CM | POA: Diagnosis not present

## 2020-09-11 DIAGNOSIS — Y92239 Unspecified place in hospital as the place of occurrence of the external cause: Secondary | ICD-10-CM | POA: Diagnosis not present

## 2020-09-11 DIAGNOSIS — Z8572 Personal history of non-Hodgkin lymphomas: Secondary | ICD-10-CM

## 2020-09-11 DIAGNOSIS — Z87891 Personal history of nicotine dependence: Secondary | ICD-10-CM | POA: Diagnosis not present

## 2020-09-11 DIAGNOSIS — H9190 Unspecified hearing loss, unspecified ear: Secondary | ICD-10-CM | POA: Diagnosis present

## 2020-09-11 DIAGNOSIS — J9811 Atelectasis: Secondary | ICD-10-CM | POA: Diagnosis present

## 2020-09-11 DIAGNOSIS — R918 Other nonspecific abnormal finding of lung field: Secondary | ICD-10-CM | POA: Diagnosis not present

## 2020-09-11 DIAGNOSIS — K8020 Calculus of gallbladder without cholecystitis without obstruction: Secondary | ICD-10-CM | POA: Diagnosis not present

## 2020-09-11 DIAGNOSIS — J189 Pneumonia, unspecified organism: Secondary | ICD-10-CM | POA: Diagnosis not present

## 2020-09-11 DIAGNOSIS — E785 Hyperlipidemia, unspecified: Secondary | ICD-10-CM | POA: Diagnosis present

## 2020-09-11 DIAGNOSIS — Z7982 Long term (current) use of aspirin: Secondary | ICD-10-CM

## 2020-09-11 DIAGNOSIS — Z8249 Family history of ischemic heart disease and other diseases of the circulatory system: Secondary | ICD-10-CM

## 2020-09-11 DIAGNOSIS — R059 Cough, unspecified: Secondary | ICD-10-CM | POA: Diagnosis not present

## 2020-09-11 DIAGNOSIS — R001 Bradycardia, unspecified: Secondary | ICD-10-CM | POA: Diagnosis present

## 2020-09-11 LAB — CBC WITH DIFFERENTIAL/PLATELET
Abs Immature Granulocytes: 0.06 10*3/uL (ref 0.00–0.07)
Basophils Absolute: 0 10*3/uL (ref 0.0–0.1)
Basophils Relative: 0 %
Eosinophils Absolute: 0 10*3/uL (ref 0.0–0.5)
Eosinophils Relative: 0 %
HCT: 39.4 % (ref 39.0–52.0)
Hemoglobin: 13.7 g/dL (ref 13.0–17.0)
Immature Granulocytes: 1 %
Lymphocytes Relative: 5 %
Lymphs Abs: 0.4 10*3/uL — ABNORMAL LOW (ref 0.7–4.0)
MCH: 30.1 pg (ref 26.0–34.0)
MCHC: 34.8 g/dL (ref 30.0–36.0)
MCV: 86.6 fL (ref 80.0–100.0)
Monocytes Absolute: 0.5 10*3/uL (ref 0.1–1.0)
Monocytes Relative: 7 %
Neutro Abs: 6.3 10*3/uL (ref 1.7–7.7)
Neutrophils Relative %: 87 %
Platelets: 234 10*3/uL (ref 150–400)
RBC: 4.55 MIL/uL (ref 4.22–5.81)
RDW: 12.6 % (ref 11.5–15.5)
WBC: 7.3 10*3/uL (ref 4.0–10.5)
nRBC: 0 % (ref 0.0–0.2)

## 2020-09-11 LAB — COMPREHENSIVE METABOLIC PANEL
ALT: 17 U/L (ref 0–44)
AST: 28 U/L (ref 15–41)
Albumin: 3.3 g/dL — ABNORMAL LOW (ref 3.5–5.0)
Alkaline Phosphatase: 62 U/L (ref 38–126)
Anion gap: 12 (ref 5–15)
BUN: 17 mg/dL (ref 8–23)
CO2: 29 mmol/L (ref 22–32)
Calcium: 8.6 mg/dL — ABNORMAL LOW (ref 8.9–10.3)
Chloride: 94 mmol/L — ABNORMAL LOW (ref 98–111)
Creatinine, Ser: 1.29 mg/dL — ABNORMAL HIGH (ref 0.61–1.24)
GFR, Estimated: 58 mL/min — ABNORMAL LOW (ref >60)
Glucose, Bld: 143 mg/dL — ABNORMAL HIGH (ref 70–99)
Potassium: 2.9 mmol/L — ABNORMAL LOW (ref 3.5–5.1)
Sodium: 135 mmol/L (ref 135–145)
Total Bilirubin: 0.9 mg/dL (ref 0.3–1.2)
Total Protein: 7.2 g/dL (ref 6.5–8.1)

## 2020-09-11 LAB — MAGNESIUM: Magnesium: 1.9 mg/dL (ref 1.7–2.4)

## 2020-09-11 LAB — TROPONIN I (HIGH SENSITIVITY)
Troponin I (High Sensitivity): 101 ng/L (ref <18)
Troponin I (High Sensitivity): 97 ng/L — ABNORMAL HIGH (ref <18)

## 2020-09-11 LAB — LACTIC ACID, PLASMA: Lactic Acid, Venous: 1.5 mmol/L (ref 0.5–1.9)

## 2020-09-11 LAB — PROCALCITONIN: Procalcitonin: <0.1 ng/mL

## 2020-09-11 LAB — CK: Total CK: 155 U/L (ref 49–397)

## 2020-09-11 MED ORDER — ENOXAPARIN SODIUM 40 MG/0.4ML ~~LOC~~ SOLN
40.0000 mg | SUBCUTANEOUS | Status: DC
  Administered 2020-09-11 – 2020-09-13 (×3): 40 mg via SUBCUTANEOUS
  Filled 2020-09-11 (×3): qty 0.4

## 2020-09-11 MED ORDER — SODIUM CHLORIDE 0.9 % IV SOLN
200.0000 mg | Freq: Once | INTRAVENOUS | Status: AC
  Administered 2020-09-11: 22:00:00 200 mg via INTRAVENOUS
  Filled 2020-09-11: qty 200
  Filled 2020-09-11: qty 40

## 2020-09-11 MED ORDER — ASCORBIC ACID 500 MG PO TABS
500.0000 mg | ORAL_TABLET | Freq: Every day | ORAL | Status: DC
  Administered 2020-09-11 – 2020-09-16 (×6): 500 mg via ORAL
  Filled 2020-09-11 (×6): qty 1

## 2020-09-11 MED ORDER — SODIUM CHLORIDE 0.9 % IV SOLN: 100.0000 mg | Freq: Every day | INTRAVENOUS | Status: DC

## 2020-09-11 MED ORDER — POTASSIUM CHLORIDE CRYS ER 20 MEQ PO TBCR
40.0000 meq | EXTENDED_RELEASE_TABLET | Freq: Once | ORAL | Status: AC
  Administered 2020-09-11: 40 meq via ORAL
  Filled 2020-09-11: qty 2

## 2020-09-11 MED ORDER — ONDANSETRON HCL 4 MG PO TABS: 4.0000 mg | ORAL_TABLET | Freq: Four times a day (QID) | ORAL | Status: DC | PRN

## 2020-09-11 MED ORDER — SODIUM CHLORIDE 0.9 % IV SOLN
100.0000 mg | Freq: Every day | INTRAVENOUS | Status: AC
  Administered 2020-09-12 – 2020-09-15 (×4): 100 mg via INTRAVENOUS
  Filled 2020-09-11 (×4): qty 20

## 2020-09-11 MED ORDER — ACETAMINOPHEN 325 MG PO TABS
650.0000 mg | ORAL_TABLET | Freq: Four times a day (QID) | ORAL | Status: DC | PRN
  Administered 2020-09-13 – 2020-09-15 (×2): 650 mg via ORAL
  Filled 2020-09-11 (×3): qty 2

## 2020-09-11 MED ORDER — SODIUM CHLORIDE 0.9 % IV SOLN
200.0000 mg | Freq: Once | INTRAVENOUS | Status: DC
  Filled 2020-09-11: qty 40

## 2020-09-11 MED ORDER — METHYLPREDNISOLONE SODIUM SUCC 125 MG IJ SOLR
125.0000 mg | Freq: Once | INTRAMUSCULAR | Status: AC
  Administered 2020-09-11: 125 mg via INTRAVENOUS
  Filled 2020-09-11: qty 2

## 2020-09-11 MED ORDER — ZINC SULFATE 220 (50 ZN) MG PO CAPS
220.0000 mg | ORAL_CAPSULE | Freq: Every day | ORAL | Status: DC
  Administered 2020-09-11 – 2020-09-16 (×6): 220 mg via ORAL
  Filled 2020-09-11 (×6): qty 1

## 2020-09-11 MED ORDER — ASPIRIN 81 MG PO CHEW
CHEWABLE_TABLET | ORAL | Status: AC
  Filled 2020-09-11: qty 1

## 2020-09-11 MED ORDER — ASPIRIN EC 81 MG PO TBEC
81.0000 mg | DELAYED_RELEASE_TABLET | Freq: Every day | ORAL | Status: DC
  Administered 2020-09-11 – 2020-09-16 (×6): 81 mg via ORAL
  Filled 2020-09-11 (×5): qty 1

## 2020-09-11 MED ORDER — ONDANSETRON HCL 4 MG/2ML IJ SOLN: 4.0000 mg | Freq: Four times a day (QID) | INTRAMUSCULAR | Status: DC | PRN

## 2020-09-11 MED ORDER — LACTATED RINGERS IV BOLUS
1000.0000 mL | Freq: Once | INTRAVENOUS | Status: AC
  Administered 2020-09-11: 1000 mL via INTRAVENOUS

## 2020-09-11 MED ORDER — POTASSIUM CHLORIDE CRYS ER 20 MEQ PO TBCR
40.0000 meq | EXTENDED_RELEASE_TABLET | Freq: Two times a day (BID) | ORAL | Status: AC
  Administered 2020-09-11: 40 meq via ORAL
  Filled 2020-09-11: qty 2

## 2020-09-11 MED ORDER — ZOLPIDEM TARTRATE 5 MG PO TABS
5.0000 mg | ORAL_TABLET | Freq: Every day | ORAL | Status: DC
  Administered 2020-09-11 – 2020-09-15 (×5): 5 mg via ORAL
  Filled 2020-09-11 (×5): qty 1

## 2020-09-11 MED ORDER — IOHEXOL 350 MG/ML SOLN
75.0000 mL | Freq: Once | INTRAVENOUS | Status: AC | PRN
  Administered 2020-09-11: 75 mL via INTRAVENOUS

## 2020-09-11 MED ORDER — PRAVASTATIN SODIUM 20 MG PO TABS
40.0000 mg | ORAL_TABLET | Freq: Every day | ORAL | Status: DC
  Administered 2020-09-11 – 2020-09-15 (×5): 40 mg via ORAL
  Filled 2020-09-11: qty 1
  Filled 2020-09-11 (×5): qty 2

## 2020-09-11 MED ORDER — GABAPENTIN 300 MG PO CAPS
300.0000 mg | ORAL_CAPSULE | Freq: Every day | ORAL | Status: DC
  Administered 2020-09-11 – 2020-09-15 (×5): 300 mg via ORAL
  Filled 2020-09-11 (×5): qty 1

## 2020-09-11 NOTE — H&P (Signed)
History and Physical    John Gallegos [id number] DOB: May 14, 1946 DOA: 09/11/2020  PCP: Tracie Harrier, MD   Patient coming from: Home  I have personally briefly reviewed patient's old medical records in [place]  Chief Complaint: Weakness  HPI: John Gallegos is a 75 y.o. male with medical history significant for non-Hodgkin's lymphoma currently in remission, hiatal hernia, hypertension and gout who was diagnosed with COVID-19 viral infection on 09/04/20 at [facility] (results in care everywhere).  He states that since his diagnosis he has felt extremely weak, lightheaded and fatigued.  He also complains of diarrhea, anorexia, cough productive of clear phlegm and nausea but denies having any emesis.  He denies having any fever or chills.  He has no abdominal pain, no urinary frequency, no nocturia, no dysuria, no palpitations, no diaphoresis, no blurred vision, no headache.  He also complains of exertional shortness of breath. He was advised to come into the emergency room by his primary care provider because of worsening symptoms. Patient is unvaccinated Labs show sodium 135, potassium 2.9, chloride 94, bicarb 29, glucose 143, BUN 17, creatinine 1.29, calcium 8.6, magnesium 1.9, alkaline phosphatase 62, albumin 3.3, AST 28, ALT 17, total protein 7.2, total CK 155, troponin I 01 >> 97, lactic acid 1.5, procalcitonin less than 0.10, white count 7.3, hemoglobin 13.7, hematocrit 39.4, MCV 86.6, RDW 12.6, platelet count 234 Chest x-ray reviewed by me shows possible lobar atelectasis of right lower lobe. Mild right basilar opacity is noted laterally concerning for subsegmental atelectasis or possibly infiltrate. Follow-up radiographs are recommended. CT angiogram of the chest shows no evidence of pulmonary embolism.  Increased size of small right pleural effusion with tiny left pleural effusion.   Similar right lower lobe round atelectasis adjacent to this there is a new  scattered bilateral subsegmental atelectasis. Additional new juxtapleural round opacity in the lateral right lower lobe is nonspecific favored to represent represent another focus of round atelectasis atelectasis or most likely pneumonia. Recommend short interval follow-up CT in 3 months assess stability. CT scan of the head without contrast shows no acute intracranial abnormality. Signs of atrophy and chronicmicrovascular ischemic change similar to prior MRI. Sinus disease increased on the LEFT and potentially with acute component and improved on the RIGHT when compared to 2019 evaluation. Twelve-lead EKG shows sinus rhythm with PACs    ED Course: Patient is a 75 year old male who is unvaccinated against the COVID-19 virus and who presents to the ER for evaluation of worsening weakness associated with dizziness, lightheadedness, diarrhea and anorexia.  Patient tested positive for the COVID-19 virus on 09/04/20 and has had progressively worsening symptoms.  Labs showed hypokalemia related to GI loss.  Patient is extremely weak and unable to ambulate.  He will be admitted to the hospital for further evaluation.  Review of Systems: As per HPI otherwise all systems reviewed and negative.    Past Medical History:  Diagnosis Date  . Gout   . Hard of hearing   . Hiatal hernia   . Hypertension   . 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 Urinary incontinence    frequency    Past Surgical History:  Procedure Laterality Date  . CYSTOSCOPY W/ RETROGRADES Left 08/19/2017   Procedure: CYSTOSCOPY WITH RETROGRADE PYELOGRAM;  Surgeon: Abbie Sons, MD;  Location: [facility];  Service: Urology;  Laterality: Left;  . CYSTOSCOPY W/ URETERAL STENT REMOVAL Left 08/19/2017   Procedure: CYSTOSCOPY  WITH STENT REMOVAL;  Surgeon: Abbie Sons, MD;  Location: [facility];  Service: Urology;  Laterality: Left;  . CYSTOSCOPY WITH BIOPSY Left 05/13/2017   Procedure:  CYSTOSCOPY WITH URETERAL BIOPSY;  Surgeon: Nickie Retort, MD;  Location: [facility];  Service: Urology;  Laterality: Left;  . CYSTOSCOPY WITH STENT PLACEMENT Left 05/13/2017   Procedure: CYSTOSCOPY WITH STENT PLACEMENT;  Surgeon: Nickie Retort, MD;  Location: [facility];  Service: Urology;  Laterality: Left;  . PORTA CATH INSERTION N/A 06/14/2017   Procedure: PORTA CATH INSERTION;  Surgeon: Algernon Huxley, MD;  Location: [facility];  Service: Cardiovascular;  Laterality: N/A;  . PORTA CATH REMOVAL N/A 02/25/2020   Procedure: PORTA CATH REMOVAL;  Surgeon: Algernon Huxley, MD;  Location: [facility];  Service: Cardiovascular;  Laterality: N/A;  . 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 right knne surgery     needle went through knee  . TONSILLECTOMY    . URETEROSCOPY Left 05/13/2017   Procedure: URETEROSCOPY;  Surgeon: Nickie Retort, MD;  Location: [facility];  Service: Urology;  Laterality: Left;  . XI ROBOTIC ASSISTED VENTRAL HERNIA N/A 05/18/2019   Procedure: XI ROBOTIC ASSISTED UMBILICAL HERNIA;  Surgeon: Benjamine Sprague, DO;  Location: [facility];  Service: General;  Laterality: N/A;     reports that he quit smoking about 3 years ago. His smoking use included cigarettes. He has a 25.00 pack-year smoking history. He has never used smokeless tobacco. He reports current alcohol use of about 2.0 standard drinks of alcohol per week. He reports that he does not use drugs.  Allergies  Allergen Reactions  . Amlodipine     Severe leg swelling    Family History  Problem Relation Age of Onset  . Pancreatic cancer Mother   . Aneurysm Mother   . Heart attack Mother   . Lung cancer Father   . Lymphoma Sister   . Prostate cancer Neg Hx   . Kidney cancer Neg Hx   . Bladder Cancer Neg Hx      Prior to Admission medications   Medication Sig Start Date End Date Taking? Authorizing Provider  aspirin EC 81 MG tablet Take 81 mg by mouth daily.    [provider]  carvedilol  (COREG) 12.5 MG tablet Take 0.5 tablets (6.25 mg total) by mouth 2 (two) times daily. 07/21/20   Minna Merritts, MD  doxazosin (CARDURA) 2 MG tablet Take 1 tablet (2 mg total) by mouth 2 (two) times daily. 07/21/20   Minna Merritts, MD  EPINEPHrine 0.3 mg/0.3 mL IJ SOAJ injection Inject 0.3 mg into the muscle as needed for anaphylaxis.  Patient not taking: Reported on 08/20/2020 01/20/18   [provider]  etodolac (LODINE) 500 MG tablet Take 500 mg by mouth as needed. Patient not taking: Reported on 08/20/2020    [provider]  gabapentin (NEURONTIN) 300 MG capsule Take 1 capsule (300 mg total) by mouth at bedtime. 08/20/20   Earlie Server, MD  hydrALAZINE (APRESOLINE) 100 MG tablet Take 1 tablet (100 mg total) by mouth 3 (three) times daily as needed. 07/21/20   Minna Merritts, MD  ibuprofen (ADVIL) 800 MG tablet Take 1 tablet (800 mg total) by mouth every 8 (eight) hours as needed for mild pain or moderate pain. Patient not taking: Reported on 08/20/2020 05/19/19   Benjamine Sprague, DO  losartan-hydrochlorothiazide (HYZAAR) 100-25 MG tablet Take 1 tablet by mouth daily.  02/13/19   [provider]  potassium chloride (KLOR-CON) 20 MEQ packet Take one tablet by mouth every Monday, Wednesday, Friday.    [provider]  pravastatin (PRAVACHOL) 40 MG tablet TAKE 1 TABLET BY MOUTH EVERY NIGHT 07/17/19   [provider]  zolpidem (AMBIEN) 10 MG tablet Take 10 mg by mouth at bedtime. 02/10/17   [provider]    Physical Exam: Vitals:   09/11/20 1400 09/11/20 1500 09/11/20 1612 09/11/20 1658  BP: 108/60 (!) 104/52 135/79   Pulse: (!) 54 (!) 49 67   Resp: 18 18 20    Temp:   98.4 F (36.9 C)   TempSrc:      SpO2: 93% 92% (!) 89%   Weight:    105.7 kg  Height:    6\' 1"  (1.854 m)     Vitals:   09/11/20 1400 09/11/20 1500 09/11/20 1612 09/11/20 1658  BP: 108/60 (!) 104/52 135/79   Pulse: (!) 54 (!) 49 67   Resp: 18 18 20    Temp:   98.4 F  (36.9 C)   TempSrc:      SpO2: 93% 92% (!) 89%   Weight:    105.7 kg  Height:    6\' 1"  (1.854 m)    Constitutional: NAD, alert and oriented x 3 Eyes: PERRL, lids and conjunctivae normal ENMT: Mucous membranes are dry.  Neck: normal, supple, no masses, no thyromegaly Respiratory: clear to auscultation bilaterally, no wheezing, no crackles. Normal respiratory effort. No accessory muscle use.  Cardiovascular: Regular rate and rhythm, no murmurs / rubs / gallops. No extremity edema. 2+ pedal pulses. No carotid bruits.  Abdomen: no tenderness, no masses palpated. No hepatosplenomegaly. Bowel sounds positive.  Central adiposity Musculoskeletal: no clubbing / cyanosis. No joint deformity upper and lower extremities.  Skin: no rashes, lesions, ulcers.  Neurologic: No gross focal neurologic deficit.  Generalized weakness Psychiatric: Normal mood and affect.   Labs on Admission: I have personally reviewed following labs and imaging studies  CBC: Recent Labs  Lab 09/11/20 1330  WBC 7.3  NEUTROABS 6.3  HGB 13.7  HCT 39.4  MCV 86.6  PLT 657   Basic Metabolic Panel: Recent Labs  Lab 09/11/20 1330 09/11/20 1520  NA 135  --   K 2.9*  --   CL 94*  --   CO2 29  --   GLUCOSE 143*  --   BUN 17  --   CREATININE 1.29*  --   CALCIUM 8.6*  --   MG  --  1.9   GFR: Estimated Creatinine Clearance: 64.1 mL/min (A) (by C-G formula based on SCr of 1.29 mg/dL (H)). Liver Function Tests: Recent Labs  Lab 09/11/20 1330  AST 28  ALT 17  ALKPHOS 62  BILITOT 0.9  PROT 7.2  ALBUMIN 3.3*   No results for input(s): LIPASE, AMYLASE in the last 168 hours. No results for input(s): AMMONIA in the last 168 hours. Coagulation Profile: No results for input(s): INR, PROTIME in the last 168 hours. Cardiac Enzymes: Recent Labs  Lab 09/11/20 1520  CKTOTAL 155   BNP (last 3 results) No results for input(s): PROBNP in the last 8760 hours. HbA1C: No results for input(s): HGBA1C in the last 72  hours. CBG: No results for input(s): GLUCAP in the last 168 hours. Lipid Profile: No results for input(s): CHOL, HDL, LDLCALC, TRIG, CHOLHDL, LDLDIRECT in the last 72 hours. Thyroid Function Tests: No results for input(s): TSH, T4TOTAL, FREET4, T3FREE, THYROIDAB in the  last 72 hours. Anemia Panel: No results for input(s): VITAMINB12, FOLATE, FERRITIN, TIBC, IRON, RETICCTPCT in the last 72 hours. Urine analysis:    Component Value Date/Time   COLORURINE YELLOW (A) 02/18/2020 1216   APPEARANCEUR CLEAR (A) 02/18/2020 1216   APPEARANCEUR Clear 05/10/2017 1431   LABSPEC 1.005 02/18/2020 1216   LABSPEC 1.010 09/18/2013 1218   PHURINE 5.0 02/18/2020 1216   GLUCOSEU NEGATIVE 02/18/2020 1216   GLUCOSEU Negative 09/18/2013 1218   HGBUR SMALL (A) 02/18/2020 1216   BILIRUBINUR NEGATIVE 02/18/2020 1216   BILIRUBINUR Negative 05/10/2017 1431   BILIRUBINUR Negative 09/18/2013 1218   KETONESUR NEGATIVE 02/18/2020 1216   PROTEINUR NEGATIVE 02/18/2020 1216   NITRITE NEGATIVE 02/18/2020 1216   LEUKOCYTESUR NEGATIVE 02/18/2020 1216   LEUKOCYTESUR Negative 09/18/2013 1218    Radiological Exams on Admission: CT Head Wo Contrast  Result Date: 09/11/2020 CLINICAL DATA:  Mental status changes in a patient with reported COVID-19 infection at age 35. EXAM: CT HEAD WITHOUT CONTRAST TECHNIQUE: Contiguous axial images were obtained from the base of the skull through the vertex without intravenous contrast. COMPARISON:  February 2015 and MRI from 2019. FINDINGS: Brain: No evidence of acute infarction, hemorrhage, hydrocephalus, extra-axial collection or mass lesion/mass effect. Signs of atrophy and chronic microvascular ischemic change similar to prior MRI. Vascular: No hyperdense vessel or unexpected calcification. Skull: Normal. Negative for fracture or focal lesion. Sinuses/Orbits: Extensive LEFT ethmoid and maxillary sinus disease increased since the previous MRI where there was significant sinus disease,  likely with acute component and with resolution of sinus disease within the visualized RIGHT maxillary sinus when compared to more remote imaging. Other: None. IMPRESSION: 1. No acute intracranial abnormality. 2. Signs of atrophy and chronic microvascular ischemic change similar to prior MRI. 3. Sinus disease increased on the LEFT and potentially with acute component and improved on the RIGHT when compared to 2019 evaluation. Electronically Signed   By: Zetta Bills M.D.   On: 09/11/2020 14:47   CT Angio Chest PE W and/or Wo Contrast  Result Date: 09/11/2020 CLINICAL DATA:  Near syncope, cough, elevated troponin, COVID positive EXAM: CT ANGIOGRAPHY CHEST WITH CONTRAST TECHNIQUE: Multidetector CT imaging of the chest was performed using the standard protocol during bolus administration of intravenous contrast. Multiplanar CT image reconstructions and MIPs were obtained to evaluate the vascular anatomy. CONTRAST:  72mL OMNIPAQUE IOHEXOL 350 MG/ML SOLN COMPARISON:  Same day chest radiograph, and PET-CT January 09, 2018. FINDINGS: Cardiovascular: Satisfactory opacification of the pulmonary arteries to the segmental level. No evidence of pulmonary embolism. Coronary artery calcifications. Aortic atherosclerosis. Left-sided cardiac enlargement. No pericardial effusion. Mediastinum/Nodes: No discrete thyroid nodules. No mediastinal, hilar or axillary lymphadenopathy. Trachea and esophagus are unremarkable. Lungs/Pleura: Increased size of the small right pleural effusion with new tiny left pleural effusion. Similar right lower lobe round atelectasis adjacent to this there is a new scattered bilateral subsegmental atelectasis. Juxtapleural round opacity in the lateral right lower lobe. Upper Abdomen: Similar stranding along the celiac and SMA arteries. Cholelithiasis without findings of cholecystitis. Similar nodular thickening of the left adrenal gland follow-up. Musculoskeletal: No chest wall abnormality. No acute or  significant osseous findings. Multilevel degenerative changes spine. Review of the MIP images confirms the above findings. IMPRESSION: 1. No evidence of pulmonary embolism. 2. Increased size of small right pleural effusion with new tiny left pleural effusion. 3. Similar right lower lobe round atelectasis adjacent to this there is a new scattered bilateral subsegmental atelectasis. Additional new juxtapleural round opacity in the lateral right lower  lobe is nonspecific favored to represent represent another focus of round atelectasis atelectasis or most likely pneumonia. Recommend short interval follow-up CT in 3 months assess stability. 4. Stranding along the celiac and SMA arteries, similar prior PET-CT. 5. Cholelithiasis without findings of cholecystitis. 6. Aortic atherosclerosis. Aortic Atherosclerosis (ICD10-I70.0). Electronically Signed   By: Dahlia Bailiff MD   On: 09/11/2020 15:55   DG Chest Portable 1 View  Result Date: 09/11/2020 CLINICAL DATA:  Shortness of breath. EXAM: PORTABLE CHEST 1 VIEW COMPARISON:  September 18, 2013. FINDINGS: Stable cardiomediastinal silhouette. No pneumothorax is noted. Left lung is clear. Possible lobar atelectasis of right lower lobe is noted. Mild right basilar opacity is noted laterally concerning for subsegmental atelectasis or possibly infiltrate. Bony thorax is unremarkable. IMPRESSION: Possible lobar atelectasis of right lower lobe. Mild right basilar opacity is noted laterally concerning for subsegmental atelectasis or possibly infiltrate. Follow-up radiographs are recommended. Electronically Signed   By: Marijo Conception M.D.   On: 09/11/2020 14:14    EKG: Independently reviewed.  Sinus rhythm with PACs  Assessment/Plan Principal Problem:   Gastroenteritis due to COVID-19 virus Active Problems:   Hypertension   Generalized weakness   Hiatal hernia   Hypokalemia     Gastroenteritis due to COVID-19 virus Patient is unvaccinated and tested positive for  the COVID-19 virus on 09/04/20 He has had predominantly gastrointestinal symptoms which include diarrhea, nausea and an occasional cough. He presents for evaluation of weakness and has hypokalemia as well as evidence of dehydration (0.93 >> 1.29) Gentle IV fluid hydration Place patient on remdesivir per protocol for 3 days Supplement potassium check magnesium levels Monitor inflammatory markers    Generalized weakness/near syncope Most likely related to volume depletion from GI loss relative hypotension related to antihypertensive medications Hold carvedilol, hydralazine, losartan and HCTZ   Hypokalemia Most likely related to GI loss as well as HCTZ use Supplement potassium   History of coronary artery disease Continue aspirin and statins  DVT prophylaxis: Lovenox Code Status: Full code Family Communication: Greater than 50% of time was spent discussing patient's condition and plan of care with him at the bedside.  All questions and concerns have been addressed.  He verbalizes understanding and agrees with the plan. Disposition Plan: Back to previous home environment Consults called: Physical therapy    Aliea Bobe MD [facility]     09/11/2020, 5:15 PM

## 2020-09-11 NOTE — Consult Note (Signed)
Remdesivir - Pharmacy Brief Note   O:  ALT: 17 CXR: "Possible lobar atelectasis of right lower lobe. Mild right basilar opacity is noted laterally concerning for subsegmental atelectasis or possibly infiltrate" SpO2: 91-93% on RA   A/P:  Per chart review, patient with reported positive SARS-CoV-2 test on 1/27. No inpatient testing available at this time to confirm.   Remdesivir 200 mg IVPB once followed by 100 mg IVPB daily x 4 days.   John Gallegos 09/11/2020 3:05 PM

## 2020-09-11 NOTE — ED Triage Notes (Signed)
Pt was sent to ED by [facility].  Pt tested positive for covid 1/27 and has nearly fainted 4 times, last time he had a near syncope was 1/27.  Pt feels very weak.  Speaking in full sentences

## 2020-09-11 NOTE — ED Provider Notes (Signed)
[facility] Emergency Department Provider Note  ____________________________________________   Event Date/Time   First MD Initiated Contact with Patient 09/11/20 1316     (approximate)  I have reviewed the triage vital signs and the nursing notes.   HISTORY  Chief Complaint Near Syncope    HPI John Gallegos is a 75 y.o. male with past medical history as below here with generalized weakness.  The patient was reportedly recently diagnosed with Covid.  He states he has felt generally weak and has had increasing extreme fatigue over the last week.  He states that over the last several days, he has been so weak that he has had difficulty even walking around the house.  He states that when he attempts to walk, he gets very lightheaded, weak, and short of breath.  Symptoms seem to be progressively worsening.  He has had decreased appetite and has not wanted to eat or drink anything.  He states he has had some intermittent nausea but no vomiting.  No diarrhea.  States he feels like he cannot take care of himself in his current state.  He went to his PCP today, who sent him here for further evaluation.  Denies any other medical changes.        Past Medical History:  Diagnosis Date  . Gout   . Hard of hearing   . Hiatal hernia   . Hypertension   . Non Hodgkin's lymphoma ([place]) 04/2017   B-cell, chemo tx's and in remission in 2019  . Urinary incontinence    frequency    Patient Active Problem List   Diagnosis Date Noted  . Generalized weakness 09/11/2020  . Lymphedema 06/03/2020  . Swelling of limb 11/20/2019  . Incarcerated umbilical hernia [id number]  . Hearing loss 10/26/2018  . 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 CAD (coronary artery disease), native coronary artery 08/29/2017  . Chest pain with moderate risk for cardiac etiology 08/27/2017  . Smoker 08/27/2017  . 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 B-cell lymphoma of lymph nodes of multiple regions ([place]) 06/17/2017  . Arthritis 05/10/2017  . Hyperlipidemia, unspecified 05/10/2017  . Hypertension 05/10/2017  . Insomnia 05/10/2017    Past Surgical History:  Procedure Laterality Date  . CYSTOSCOPY W/ RETROGRADES Left 08/19/2017   Procedure: CYSTOSCOPY WITH RETROGRADE PYELOGRAM;  Surgeon: Abbie Sons, MD;  Location: [facility] OR[facility];  Service: Urology;  Laterality: Left;  . CYSTOSCOPY W/ URETERAL STENT REMOVAL Left 08/19/2017   Procedure: CYSTOSCOPY WITH STENT REMOVAL;  Surgeon: Abbie Sons, MD;  Location: [facility];  Service: Urology;  Laterality: Left;  . CYSTOSCOPY WITH BIOPSY Left 05/13/2017   Procedure: CYSTOSCOPY WITH URETERAL BIOPSY;  Surgeon: Nickie Retort, MD;  Location: [facility];  Service: Urology;  Laterality: Left;  . CYSTOSCOPY WITH STENT PLACEMENT Left 05/13/2017   Procedure: CYSTOSCOPY WITH STENT PLACEMENT;  Surgeon: Nickie Retort, MD;  Location: [facility];  Service: Urology;  Laterality: Left;  . PORTA CATH INSERTION N/A 06/14/2017   Procedure: PORTA CATH INSERTION;  Surgeon: Algernon Huxley, MD;  Location: [place] [facility];  Service: Cardiovascular;  Laterality: N/A;  . PORTA CATH REMOVAL N/A 02/25/2020   Procedure: PORTA CATH REMOVAL;  Surgeon: Algernon Huxley, MD;  Location: [place] [facility];  Service: Cardiovascular;  Laterality: N/A;  . 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 right knne surgery     needle went  through knee  . TONSILLECTOMY    . URETEROSCOPY Left 05/13/2017   Procedure: URETEROSCOPY;  Surgeon: Nickie Retort, MD;  Location: [facility];  Service: Urology;  Laterality: Left;  . XI ROBOTIC ASSISTED VENTRAL HERNIA N/A 05/18/2019   Procedure: XI ROBOTIC ASSISTED UMBILICAL HERNIA;  Surgeon: Benjamine Sprague, DO;  Location: [facility];  Service: General;  Laterality: N/A;    Prior to Admission medications   Medication Sig Start Date End Date Taking? Authorizing Provider  aspirin EC  81 MG tablet Take 81 mg by mouth daily.    [provider]  carvedilol (COREG) 12.5 MG tablet Take 0.5 tablets (6.25 mg total) by mouth 2 (two) times daily. 07/21/20   Minna Merritts, MD  doxazosin (CARDURA) 2 MG tablet Take 1 tablet (2 mg total) by mouth 2 (two) times daily. 07/21/20   Minna Merritts, MD  EPINEPHrine 0.3 mg/0.3 mL IJ SOAJ injection Inject 0.3 mg into the muscle as needed for anaphylaxis.  Patient not taking: Reported on 08/20/2020 01/20/18   [provider]  etodolac (LODINE) 500 MG tablet Take 500 mg by mouth as needed. Patient not taking: Reported on 08/20/2020    [provider]  gabapentin (NEURONTIN) 300 MG capsule Take 1 capsule (300 mg total) by mouth at bedtime. 08/20/20   Earlie Server, MD  hydrALAZINE (APRESOLINE) 100 MG tablet Take 1 tablet (100 mg total) by mouth 3 (three) times daily as needed. 07/21/20   Minna Merritts, MD  ibuprofen (ADVIL) 800 MG tablet Take 1 tablet (800 mg total) by mouth every 8 (eight) hours as needed for mild pain or moderate pain. Patient not taking: Reported on 08/20/2020 05/19/19   Benjamine Sprague, DO  losartan-hydrochlorothiazide (HYZAAR) 100-25 MG tablet Take 1 tablet by mouth daily.  02/13/19   [provider]  potassium chloride (KLOR-CON) 20 MEQ packet Take one tablet by mouth every Monday, Wednesday, Friday.    [provider]  pravastatin (PRAVACHOL) 40 MG tablet TAKE 1 TABLET BY MOUTH EVERY NIGHT 07/17/19   [provider]  zolpidem (AMBIEN) 10 MG tablet Take 10 mg by mouth at bedtime. 02/10/17   [provider]    Allergies Amlodipine  Family History  Problem Relation Age of Onset  . Pancreatic cancer Mother   . Aneurysm Mother   . Heart attack Mother   . Lung cancer Father   . Lymphoma Sister   . Prostate cancer Neg Hx   . Kidney cancer Neg Hx   . Bladder Cancer Neg Hx     Social History Social History   Tobacco Use  . Smoking status: Former Smoker     Packs/day: 0.50    Years: 50.00    Pack years: 25.00    Types: Cigarettes    Quit date: 12/12/2016    Years since quitting: 3.7  . Smokeless tobacco: Never Used  Vaping Use  . Vaping Use: Never used  Substance Use Topics  . Alcohol use: Yes    Alcohol/week: 2.0 standard drinks    Types: 2 Shots of liquor per week    Comment: vodka and cranberry juice-one drik daily  . Drug use: No    Review of Systems  Review of Systems  Constitutional: Positive for fatigue. Negative for chills and fever.  HENT: Negative for sore throat.   Respiratory: Positive for cough and shortness of breath.   Cardiovascular: Negative for chest pain.  Gastrointestinal: Positive for nausea. Negative for abdominal pain.  Genitourinary: Negative for  flank pain.  Musculoskeletal: Negative for neck pain.  Skin: Negative for rash and wound.  Allergic/Immunologic: Negative for immunocompromised state.  Neurological: Positive for weakness. Negative for numbness.  Hematological: Does not bruise/bleed easily.  All other systems reviewed and are negative.    ____________________________________________  PHYSICAL EXAM:      VITAL SIGNS: ED Triage Vitals  Enc Vitals Group     BP      Pulse      Resp      Temp      Temp src      SpO2      Weight      Height      Head Circumference      Peak Flow      Pain Score      Pain Loc      Pain Edu?      Excl. in [place]?      Physical Exam Vitals and nursing note reviewed.  Constitutional:      General: He is not in acute distress.    Appearance: He is well-developed.  HENT:     Head: Normocephalic and atraumatic.  Eyes:     Conjunctiva/sclera: Conjunctivae normal.  Cardiovascular:     Rate and Rhythm: Regular rhythm. Bradycardia present.     Heart sounds: Normal heart sounds. No murmur heard. No friction rub.  Pulmonary:     Effort: Pulmonary effort is normal. Tachypnea present. No respiratory distress.     Breath sounds: Examination of the right-lower  field reveals rales. Examination of the left-lower field reveals rales. Rales present. No wheezing.  Abdominal:     General: There is no distension.     Palpations: Abdomen is soft.     Tenderness: There is no abdominal tenderness.  Musculoskeletal:     Cervical back: Neck supple.  Skin:    General: Skin is warm.     Capillary Refill: Capillary refill takes less than 2 seconds.  Neurological:     Mental Status: He is alert and oriented to person, place, and time.     Motor: No abnormal muscle tone.       ____________________________________________   LABS (all labs ordered are listed, but only abnormal results are displayed)  Labs Reviewed  CBC WITH DIFFERENTIAL/PLATELET - Abnormal; Notable for the following components:      Result Value   Lymphs Abs 0.4 (*)    All other components within normal limits  COMPREHENSIVE METABOLIC PANEL - Abnormal; Notable for the following components:   Potassium 2.9 (*)    Chloride 94 (*)    Glucose, Bld 143 (*)    Creatinine, Ser 1.29 (*)    Calcium 8.6 (*)    Albumin 3.3 (*)    GFR, Estimated 58 (*)    All other components within normal limits  TROPONIN I (HIGH SENSITIVITY) - Abnormal; Notable for the following components:   Troponin I (High Sensitivity) 101 (*)    All other components within normal limits  LACTIC ACID, PLASMA  PROCALCITONIN  URINALYSIS, COMPLETE (UACMP) WITH MICROSCOPIC  MAGNESIUM  CK  TROPONIN I (HIGH SENSITIVITY)    ____________________________________________  EKG: Normal sinus rhythm, ventricular rate 57.  QRS 94, QTc 525.  Abnormal R wave progression.  Prolonged QT interval.  Nonspecific changes.  No ST elevations ________________________________________  RADIOLOGY All imaging, including plain films, CT scans, and ultrasounds, independently reviewed by me, and interpretations confirmed via formal radiology reads.  ED MD interpretation:   Chest x-ray: Possible  lobar atelectasis versus pneumonia of the  right lower lobe, subsegmental atelectasis or infiltrate in the right base  Official radiology report(s): CT Head Wo Contrast  Result Date: 09/11/2020 CLINICAL DATA:  Mental status changes in a patient with reported COVID-19 infection at age 4. EXAM: CT HEAD WITHOUT CONTRAST TECHNIQUE: Contiguous axial images were obtained from the base of the skull through the vertex without intravenous contrast. COMPARISON:  February 2015 and MRI from 2019. FINDINGS: Brain: No evidence of acute infarction, hemorrhage, hydrocephalus, extra-axial collection or mass lesion/mass effect. Signs of atrophy and chronic microvascular ischemic change similar to prior MRI. Vascular: No hyperdense vessel or unexpected calcification. Skull: Normal. Negative for fracture or focal lesion. Sinuses/Orbits: Extensive LEFT ethmoid and maxillary sinus disease increased since the previous MRI where there was significant sinus disease, likely with acute component and with resolution of sinus disease within the visualized RIGHT maxillary sinus when compared to more remote imaging. Other: None. IMPRESSION: 1. No acute intracranial abnormality. 2. Signs of atrophy and chronic microvascular ischemic change similar to prior MRI. 3. Sinus disease increased on the LEFT and potentially with acute component and improved on the RIGHT when compared to 2019 evaluation. Electronically Signed   By: Zetta Bills M.D.   On: 09/11/2020 14:47   DG Chest Portable 1 View  Result Date: 09/11/2020 CLINICAL DATA:  Shortness of breath. EXAM: PORTABLE CHEST 1 VIEW COMPARISON:  September 18, 2013. FINDINGS: Stable cardiomediastinal silhouette. No pneumothorax is noted. Left lung is clear. Possible lobar atelectasis of right lower lobe is noted. Mild right basilar opacity is noted laterally concerning for subsegmental atelectasis or possibly infiltrate. Bony thorax is unremarkable. IMPRESSION: Possible lobar atelectasis of right lower lobe. Mild right basilar opacity is  noted laterally concerning for subsegmental atelectasis or possibly infiltrate. Follow-up radiographs are recommended. Electronically Signed   By: Marijo Conception M.D.   On: 09/11/2020 14:14    ____________________________________________  PROCEDURES   Procedure(s) performed (including Critical Care):  .1-3 Lead EKG Interpretation Performed by: Duffy Bruce, MD Authorized by: Duffy Bruce, MD     Interpretation: normal     ECG rate:  50-60   ECG rate assessment: normal     Rhythm: sinus bradycardia     Ectopy: none     Conduction: normal   Comments:     Indication: SOB .Critical Care Performed by: Duffy Bruce, MD Authorized by: Duffy Bruce, MD   Critical care provider statement:    Critical care time (minutes):  35   Critical care time was exclusive of:  Separately billable procedures and treating other patients and teaching time   Critical care was necessary to treat or prevent imminent or life-threatening deterioration of the following conditions:  Cardiac failure, circulatory failure, respiratory failure and metabolic crisis   Critical care was time spent personally by me on the following activities:  Development of treatment plan with patient or surrogate, discussions with consultants, evaluation of patient's response to treatment, examination of patient, obtaining history from patient or surrogate, ordering and performing treatments and interventions, ordering and review of laboratory studies, ordering and review of radiographic studies, pulse oximetry, re-evaluation of patient's condition and review of old charts   I assumed direction of critical care for this patient from another provider in my specialty: no      ____________________________________________  INITIAL IMPRESSION / MDM / [place] / ED COURSE  As part of my medical decision making, I reviewed the following data within the [place]  Nursing notes reviewed and  incorporated, Old chart reviewed, Notes from prior ED visits, and [place] Controlled Substance Database       *John Gallegos was evaluated in Emergency Department on 09/11/2020 for the symptoms described in the history of present illness. He was evaluated in the context of the global COVID-19 pandemic, which necessitated consideration that the patient might be at risk for infection with the SARS-CoV-2 virus that causes COVID-19. Institutional protocols and algorithms that pertain to the evaluation of patients at risk for COVID-19 are in a state of rapid change based on information released by regulatory bodies including the CDC and federal and state organizations. These policies and algorithms were followed during the patient's care in the ED.  Some ED evaluations and interventions may be delayed as a result of limited staffing during the pandemic.*     Medical Decision Making: 75 year old male here with generalized weakness in the setting of COVID-19.  Patient appears severely dehydrated and weak on exam.  No focal deficits.  Lab work shows significant hypokalemia and likely AKI.  Chest x-ray shows bibasilar pneumonia consistent with COVID-19.  Troponin elevated.  EKG does show some nonspecific changes although he is also hypokalemic which could be contributing.  Will replete his potassium.  He has no chest pain currently.  Do not suspect STEMI or ACS, likely demand.  Nonetheless, given his COVID-19 status, will check a CT angio for evaluation of concomitant PE.  Admit to medicine.  ____________________________________________  FINAL CLINICAL IMPRESSION(S) / ED DIAGNOSES  Final diagnoses:  Near syncope  COVID-19  Hypokalemia  Elevated troponin     MEDICATIONS GIVEN DURING THIS VISIT:  Medications  potassium chloride SA (KLOR-CON) CR tablet 40 mEq (has no administration in time range)  lactated ringers bolus 1,000 mL (has no administration in time range)  methylPREDNISolone sodium  succinate (SOLU-MEDROL) 125 mg/2 mL injection 125 mg (has no administration in time range)  remdesivir 200 mg in sodium chloride 0.9% 250 mL IVPB (has no administration in time range)    Followed by  remdesivir 100 mg in sodium chloride 0.9 % 100 mL IVPB (has no administration in time range)  potassium chloride SA (KLOR-CON) CR tablet 40 mEq (has no administration in time range)  aspirin EC tablet 81 mg (has no administration in time range)  pravastatin (PRAVACHOL) tablet 40 mg (has no administration in time range)  zolpidem (AMBIEN) tablet 5 mg (has no administration in time range)  gabapentin (NEURONTIN) capsule 300 mg (has no administration in time range)  enoxaparin (LOVENOX) injection 40 mg (has no administration in time range)  ascorbic acid (VITAMIN C) tablet 500 mg (has no administration in time range)  zinc sulfate capsule 220 mg (has no administration in time range)  acetaminophen (TYLENOL) tablet 650 mg (has no administration in time range)  ondansetron (ZOFRAN) tablet 4 mg (has no administration in time range)    Or  ondansetron (ZOFRAN) injection 4 mg (has no administration in time range)  iohexol (OMNIPAQUE) 350 MG/ML injection 75 mL (75 mLs Intravenous Contrast Given 09/11/20 1521)  aspirin 81 MG chewable tablet (  Return to [facility] 09/11/20 1525)     ED Discharge Orders    None       Note:  This document was prepared using Dragon voice recognition software and may include unintentional dictation errors.   Duffy Bruce, MD 09/11/20 1534

## 2020-09-12 DIAGNOSIS — I25118 Atherosclerotic heart disease of native coronary artery with other forms of angina pectoris: Secondary | ICD-10-CM

## 2020-09-12 DIAGNOSIS — U071 COVID-19: Secondary | ICD-10-CM | POA: Diagnosis not present

## 2020-09-12 DIAGNOSIS — N179 Acute kidney failure, unspecified: Secondary | ICD-10-CM

## 2020-09-12 DIAGNOSIS — E860 Dehydration: Secondary | ICD-10-CM

## 2020-09-12 LAB — COMPREHENSIVE METABOLIC PANEL
ALT: 17 U/L (ref 0–44)
AST: 23 U/L (ref 15–41)
Albumin: 3 g/dL — ABNORMAL LOW (ref 3.5–5.0)
Alkaline Phosphatase: 58 U/L (ref 38–126)
Anion gap: 10 (ref 5–15)
BUN: 28 mg/dL — ABNORMAL HIGH (ref 8–23)
CO2: 27 mmol/L (ref 22–32)
Calcium: 8.5 mg/dL — ABNORMAL LOW (ref 8.9–10.3)
Chloride: 99 mmol/L (ref 98–111)
Creatinine, Ser: 1.77 mg/dL — ABNORMAL HIGH (ref 0.61–1.24)
GFR, Estimated: 40 mL/min — ABNORMAL LOW (ref >60)
Glucose, Bld: 171 mg/dL — ABNORMAL HIGH (ref 70–99)
Potassium: 3.6 mmol/L (ref 3.5–5.1)
Sodium: 136 mmol/L (ref 135–145)
Total Bilirubin: 0.6 mg/dL (ref 0.3–1.2)
Total Protein: 6.6 g/dL (ref 6.5–8.1)

## 2020-09-12 LAB — FIBRIN DERIVATIVES D-DIMER (ARMC ONLY): Fibrin derivatives D-dimer (ARMC): 1261.23 ng/mL (FEU) — ABNORMAL HIGH (ref 0.00–499.00)

## 2020-09-12 LAB — CBC WITH DIFFERENTIAL/PLATELET
Abs Immature Granulocytes: 0.03 10*3/uL (ref 0.00–0.07)
Basophils Absolute: 0 10*3/uL (ref 0.0–0.1)
Basophils Relative: 0 %
Eosinophils Absolute: 0 10*3/uL (ref 0.0–0.5)
Eosinophils Relative: 0 %
HCT: 37 % — ABNORMAL LOW (ref 39.0–52.0)
Hemoglobin: 12.5 g/dL — ABNORMAL LOW (ref 13.0–17.0)
Immature Granulocytes: 1 %
Lymphocytes Relative: 9 %
Lymphs Abs: 0.4 10*3/uL — ABNORMAL LOW (ref 0.7–4.0)
MCH: 29.5 pg (ref 26.0–34.0)
MCHC: 33.8 g/dL (ref 30.0–36.0)
MCV: 87.3 fL (ref 80.0–100.0)
Monocytes Absolute: 0.3 10*3/uL (ref 0.1–1.0)
Monocytes Relative: 6 %
Neutro Abs: 3.5 10*3/uL (ref 1.7–7.7)
Neutrophils Relative %: 84 %
Platelets: 222 10*3/uL (ref 150–400)
RBC: 4.24 MIL/uL (ref 4.22–5.81)
RDW: 12.7 % (ref 11.5–15.5)
WBC: 4.2 10*3/uL (ref 4.0–10.5)
nRBC: 0 % (ref 0.0–0.2)

## 2020-09-12 LAB — C-REACTIVE PROTEIN: CRP: 9.9 mg/dL — ABNORMAL HIGH (ref <1.0)

## 2020-09-12 LAB — PHOSPHORUS: Phosphorus: 3.9 mg/dL (ref 2.5–4.6)

## 2020-09-12 LAB — MAGNESIUM: Magnesium: 1.8 mg/dL (ref 1.7–2.4)

## 2020-09-12 LAB — FERRITIN: Ferritin: 268 ng/mL (ref 24–336)

## 2020-09-12 MED ORDER — GUAIFENESIN-DM 100-10 MG/5ML PO SYRP
5.0000 mL | ORAL_SOLUTION | ORAL | Status: DC | PRN
  Administered 2020-09-12: 21:00:00 5 mL via ORAL

## 2020-09-12 MED ORDER — ENSURE MAX PROTEIN PO LIQD
11.0000 [oz_av] | Freq: Every day | ORAL | Status: DC
  Administered 2020-09-12 – 2020-09-14 (×3): 11 [oz_av] via ORAL
  Filled 2020-09-12: qty 330

## 2020-09-12 MED ORDER — ENSURE ENLIVE PO LIQD
237.0000 mL | Freq: Two times a day (BID) | ORAL | Status: DC
  Administered 2020-09-12 – 2020-09-16 (×7): 237 mL via ORAL

## 2020-09-12 NOTE — Progress Notes (Signed)
[facility]  PROGRESS NOTE  John Gallegos VPX:[id number] DOB: 26-Jun-1946 DOA: 09/11/2020 PCP: Tracie Harrier, MD   Brief HPI:   75 yr old male  With medical history of Non hodgkin lymphoma currently in remission, hiatal hernia, hypertension, gout who was diagnosed with Covid 19 viral infection on 09/04/2020 at [place].  Patient felt extremely weak after he was diagnosed with COVID-19, he also had diarrhea, anorexia, cough productive of clear phlegm and nausea.  Denies vomiting.  Patient had worsening symptoms he was asked by his PCP to go to ED for further evaluation.  Vaccination status-unvaccinated  In the ED chest x-ray showed possible lobar atelectasis of right lower lobe, mild right basilar opacity noted laterally concerning for subsegmental atelectasis or possibly infiltrate.  CT chest did not show evidence of PE.  CT scan of the head showed no acute intracranial abnormality.   Subjective   Patient seen and examined, denies nausea vomiting or diarrhea at this time.  Currently on 2 g sodium diet.   Assessment/Plan:     1. Gastroenteritis-secondary to COVID-19 infection.  Patient is not vaccinated against COVID-19.  Patient does not have respiratory symptoms but predominantly GI symptoms.  At this time both vomiting and diarrhea has resolved.  Patient is currently tolerating diet well.   2. COVID-19 infection-patient does not have respiratory symptoms, not requiring oxygen.  CRP is 9.9.  Patient started on remdesivir per pharmacy consultation. 3. Acute kidney injury-patient baseline creatinine is around 0.9-1.  He presents with creatinine 1.29 which has gone up to 1.77.  Likely from dehydration/diarrhea.  We will start gentle IV hydration with normal saline at 75 mill per hour for 12 hours.  Follow BMP in am. 4. Generalized weakness-near-syncope.  Likely from malnutrition from [place].  Losartan, HCTZ, Coreg on hold. 5. Hypokalemia-replete 6. History of CAD-continue  aspirin, statin     COVID-19 Labs  Recent Labs    09/12/20 0433  FERRITIN 268  CRP 9.9*    Lab Results  Component Value Date   SARSCOV2NAA NEGATIVE 02/21/2020   [place] NEGATIVE 05/15/2019     Scheduled medications:   . vitamin C  500 mg Oral Daily  . aspirin EC  81 mg Oral Daily  . enoxaparin (LOVENOX) injection  40 mg Subcutaneous Q24H  . feeding supplement  237 mL Oral BID BM  . gabapentin  300 mg Oral QHS  . pravastatin  40 mg Oral QHS  . Ensure Max Protein  11 oz Oral Daily  . zinc sulfate  220 mg Oral Daily  . zolpidem  5 mg Oral QHS         CBG: No results for input(s): GLUCAP in the last 168 hours.  SpO2: 92 %    CBC: Recent Labs  Lab 09/11/20 1330 09/12/20 0433  WBC 7.3 4.2  NEUTROABS 6.3 3.5  HGB 13.7 12.5*  HCT 39.4 37.0*  MCV 86.6 87.3  PLT 234 462    Basic Metabolic Panel: Recent Labs  Lab 09/11/20 1330 09/11/20 1520 09/12/20 0433  NA 135  --  136  K 2.9*  --  3.6  CL 94*  --  99  CO2 29  --  27  GLUCOSE 143*  --  171*  BUN 17  --  28*  CREATININE 1.29*  --  1.77*  CALCIUM 8.6*  --  8.5*  MG  --  1.9 1.8  PHOS  --   --  3.9     Liver Function Tests: Recent  Labs  Lab 09/11/20 1330 09/12/20 0433  AST 28 23  ALT 17 17  ALKPHOS 62 58  BILITOT 0.9 0.6  PROT 7.2 6.6  ALBUMIN 3.3* 3.0*     Antibiotics: Anti-infectives (From admission, onward)   Start     Dose/Rate Route Frequency Ordered Stop   09/12/20 1000  remdesivir 100 mg in sodium chloride 0.9 % 100 mL IVPB       "Followed by" Linked Group Details   100 mg 200 mL/hr over 30 Minutes Intravenous Daily 09/11/20 1508 09/16/20 0959   09/12/20 1000  remdesivir 100 mg in sodium chloride 0.9 % 100 mL IVPB  Status:  Discontinued       "Followed by" Linked Group Details   100 mg 200 mL/hr over 30 Minutes Intravenous Daily 09/11/20 1513 09/11/20 1516   09/11/20 1600  remdesivir 200 mg in sodium chloride 0.9% 250 mL IVPB       "Followed by" Linked Group Details    200 mg 580 mL/hr over 30 Minutes Intravenous Once 09/11/20 1508 09/11/20 2200   09/11/20 1600  remdesivir 200 mg in sodium chloride 0.9% 250 mL IVPB  Status:  Discontinued       "Followed by" Linked Group Details   200 mg 580 mL/hr over 30 Minutes Intravenous Once 09/11/20 1513 09/11/20 1516       DVT prophylaxis: Lovenox  Code Status: Full code  Family Communication: No family at bedside   Consultants:    Procedures:      Objective   Vitals:   09/11/20 1931 09/11/20 2353 09/12/20 0830 09/12/20 1233  BP: 126/68 127/75 134/76 (!) 141/76  Pulse: 63 61 (!) 55 (!) 54  Resp: 17 20 16 18   Temp: 98.6 F (37 C) 98.6 F (37 C) 98.2 F (36.8 C) 97.9 F (36.6 C)  TempSrc: Oral Oral  Oral  SpO2: 92% 90% 93% 92%  Weight:      Height:        Intake/Output Summary (Last 24 hours) at 09/12/2020 1541 Last data filed at 09/11/2020 2300 Gross per 24 hour  Intake 999 ml  Output 700 ml  Net 299 ml    02/02 1901 - 02/04 0700 In: 999  Out: 700 [Urine:700]  Filed Weights   09/11/20 1323 09/11/20 1658  Weight: 105.7 kg 105.7 kg    Physical Examination:    General-appears in no acute distress  Heart-S1-S2, regular, no murmur auscultated  Lungs-clear to auscultation bilaterally, no wheezing or crackles auscultated  Abdomen-soft, nontender, no organomegaly  Extremities-no edema in the lower extremities  Neuro-alert, oriented x3, no focal deficit noted   Status is: Inpatient  Dispo: The patient is from: Home              Anticipated d/c is to: Home              Anticipated d/c date is: 09/13/2020              Patient currently not medically stable for discharge  Barrier to discharge-treatment for COVID-19 infection, diarrhea      Data Reviewed:   No results found for this or any previous visit (from the past 240 hour(s)).  No results for input(s): LIPASE, AMYLASE in the last 168 hours. No results for input(s): AMMONIA in the last 168 hours.  Cardiac  Enzymes: Recent Labs  Lab 09/11/20 1520  CKTOTAL 155    Studies:  CT Head Wo Contrast  Result Date: 09/11/2020 CLINICAL DATA:  Mental status  changes in a patient with reported COVID-19 infection at age 75. EXAM: CT HEAD WITHOUT CONTRAST TECHNIQUE: Contiguous axial images were obtained from the base of the skull through the vertex without intravenous contrast. COMPARISON:  February 2015 and MRI from 2019. FINDINGS: Brain: No evidence of acute infarction, hemorrhage, hydrocephalus, extra-axial collection or mass lesion/mass effect. Signs of atrophy and chronic microvascular ischemic change similar to prior MRI. Vascular: No hyperdense vessel or unexpected calcification. Skull: Normal. Negative for fracture or focal lesion. Sinuses/Orbits: Extensive LEFT ethmoid and maxillary sinus disease increased since the previous MRI where there was significant sinus disease, likely with acute component and with resolution of sinus disease within the visualized RIGHT maxillary sinus when compared to more remote imaging. Other: None. IMPRESSION: 1. No acute intracranial abnormality. 2. Signs of atrophy and chronic microvascular ischemic change similar to prior MRI. 3. Sinus disease increased on the LEFT and potentially with acute component and improved on the RIGHT when compared to 2019 evaluation. Electronically Signed   By: Zetta Bills M.D.   On: 09/11/2020 14:47   CT Angio Chest PE W and/or Wo Contrast  Result Date: 09/11/2020 CLINICAL DATA:  Near syncope, cough, elevated troponin, COVID positive EXAM: CT ANGIOGRAPHY CHEST WITH CONTRAST TECHNIQUE: Multidetector CT imaging of the chest was performed using the standard protocol during bolus administration of intravenous contrast. Multiplanar CT image reconstructions and MIPs were obtained to evaluate the vascular anatomy. CONTRAST:  51mL OMNIPAQUE IOHEXOL 350 MG/ML SOLN COMPARISON:  Same day chest radiograph, and PET-CT January 09, 2018. FINDINGS: Cardiovascular:  Satisfactory opacification of the pulmonary arteries to the segmental level. No evidence of pulmonary embolism. Coronary artery calcifications. Aortic atherosclerosis. Left-sided cardiac enlargement. No pericardial effusion. Mediastinum/Nodes: No discrete thyroid nodules. No mediastinal, hilar or axillary lymphadenopathy. Trachea and esophagus are unremarkable. Lungs/Pleura: Increased size of the small right pleural effusion with new tiny left pleural effusion. Similar right lower lobe round atelectasis adjacent to this there is a new scattered bilateral subsegmental atelectasis. Juxtapleural round opacity in the lateral right lower lobe. Upper Abdomen: Similar stranding along the celiac and SMA arteries. Cholelithiasis without findings of cholecystitis. Similar nodular thickening of the left adrenal gland follow-up. Musculoskeletal: No chest wall abnormality. No acute or significant osseous findings. Multilevel degenerative changes spine. Review of the MIP images confirms the above findings. IMPRESSION: 1. No evidence of pulmonary embolism. 2. Increased size of small right pleural effusion with new tiny left pleural effusion. 3. Similar right lower lobe round atelectasis adjacent to this there is a new scattered bilateral subsegmental atelectasis. Additional new juxtapleural round opacity in the lateral right lower lobe is nonspecific favored to represent represent another focus of round atelectasis atelectasis or most likely pneumonia. Recommend short interval follow-up CT in 3 months assess stability. 4. Stranding along the celiac and SMA arteries, similar prior PET-CT. 5. Cholelithiasis without findings of cholecystitis. 6. Aortic atherosclerosis. Aortic Atherosclerosis (ICD10-I70.0). Electronically Signed   By: Dahlia Bailiff MD   On: 09/11/2020 15:55   DG Chest Portable 1 View  Result Date: 09/11/2020 CLINICAL DATA:  Shortness of breath. EXAM: PORTABLE CHEST 1 VIEW COMPARISON:  September 18, 2013. FINDINGS:  Stable cardiomediastinal silhouette. No pneumothorax is noted. Left lung is clear. Possible lobar atelectasis of right lower lobe is noted. Mild right basilar opacity is noted laterally concerning for subsegmental atelectasis or possibly infiltrate. Bony thorax is unremarkable. IMPRESSION: Possible lobar atelectasis of right lower lobe. Mild right basilar opacity is noted laterally concerning for subsegmental atelectasis or possibly infiltrate. Follow-up  radiographs are recommended. Electronically Signed   By: Marijo Conception M.D.   On: 09/11/2020 14:14       [place]   [facility] If 7PM-7AM, please contact night-coverage at [email or web address], Office  [phone]   09/12/2020, 3:41 PM  LOS: 1 day

## 2020-09-12 NOTE — Progress Notes (Signed)
Initial Nutrition Assessment  DOCUMENTATION CODES:   Obesity unspecified  INTERVENTION:  Ensure Enlive po BID, each supplement provides 350 kcal and 20 grams of protein  Ensure Max po daily, each supplement provides 150 kcal and 30 grams protein    NUTRITION DIAGNOSIS:   Increased nutrient needs related to catabolic illness (IDPOE-42 virus infection) as evidenced by estimated needs.    GOAL:   Patient will meet greater than or equal to 90% of their needs    MONITOR:   PO intake,Weight trends,Labs,I & O's,Supplement acceptance,Skin  REASON FOR ASSESSMENT:   Malnutrition Screening Tool    ASSESSMENT:   75 year old male with history significant for non-Hodgkin's lymphoma currently in remission, hiatal hernia, HTN, and gout who tested positive for COVID-19 on 1/27 at [place] presents with weakness, lightheadedness, fatigue, diarrhea, anorexia, cough productive of clear phlegm that has been worsening since diagnosis. Pt admitted with gastroenteritis due to COVID-19 virus.  Pt sitting up in bed this morning, reports feeling much better. He endorses good appetite, tolerating po intake, reports 100% of his breakfast and says it was really good. Pt endorses poor appetite and diarrhea over the past couple of weeks. Recalls intake of mostly fluids and Ensure. RD educated on increased needs related to acute catabolic illness and the importance of adequate nutrition. He is agreeable to drinking Ensure and prefers chocolate flavor.   Per chart, weights stable over the last 11 months.  Medications reviewed and include: Vit C, Gabapentin, Zinc, Ambien, Remdesivir  Labs: BUN 28 (H), Cr 1.77 (H), Hgb 12.5 (H), HCT 37 (H)   NUTRITION - FOCUSED PHYSICAL EXAM:  Flowsheet Row Most Recent Value  Orbital Region Moderate depletion  Upper Arm Region Mild depletion  Thoracic and Lumbar Region No depletion  Buccal Region Moderate depletion  Temple Region Mild depletion  Clavicle Bone  Region No depletion  Clavicle and Acromion Bone Region No depletion  Scapular Bone Region Unable to assess  Dorsal Hand No depletion  Patellar Region Unable to assess  Anterior Thigh Region Unable to assess  Posterior Calf Region Unable to assess  Edema (RD Assessment) None  Hair Reviewed  Eyes Reviewed  Mouth Reviewed  Skin Reviewed  Nails Reviewed       Diet Order:   Diet Order            Diet 2 gram sodium Room service appropriate? Yes; Fluid consistency: Thin  Diet effective now                 EDUCATION NEEDS:   Education needs have been addressed  Skin:  Skin Assessment: Reviewed RN Assessment  Last BM:  pta  Height:   Ht Readings from Last 1 Encounters:  09/11/20 6\' 1"  (1.854 m)    Weight:   Wt Readings from Last 1 Encounters:  09/11/20 105.7 kg    BMI:  Body mass index is 30.74 kg/m.  Estimated Nutritional Needs:   Kcal:  3536-1443  Protein:  >150  Fluid:  2.6 L    Lajuan Lines, RD, LDN Clinical Nutrition After Hours/Weekend Pager # in [place]

## 2020-09-12 NOTE — Progress Notes (Signed)
Spoke with patient's ex-wife. She stated that they are currently separated and that patient lives alone, therefore his condition would need to be topnotch for him to be able to go home. He did ambulate to bathroom with one person assist and walker. He is currently resting in bed with call bell in reach.
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
## 2020-09-13 ENCOUNTER — Inpatient Hospital Stay: Payer: Medicare HMO

## 2020-09-13 DIAGNOSIS — I25118 Atherosclerotic heart disease of native coronary artery with other forms of angina pectoris: Secondary | ICD-10-CM | POA: Diagnosis not present

## 2020-09-13 DIAGNOSIS — E876 Hypokalemia: Secondary | ICD-10-CM

## 2020-09-13 DIAGNOSIS — E860 Dehydration: Secondary | ICD-10-CM | POA: Diagnosis not present

## 2020-09-13 DIAGNOSIS — U071 COVID-19: Secondary | ICD-10-CM | POA: Diagnosis not present

## 2020-09-13 DIAGNOSIS — N179 Acute kidney failure, unspecified: Secondary | ICD-10-CM | POA: Diagnosis not present

## 2020-09-13 LAB — CBC WITH DIFFERENTIAL/PLATELET
Abs Immature Granulocytes: 0.06 10*3/uL (ref 0.00–0.07)
Basophils Absolute: 0 10*3/uL (ref 0.0–0.1)
Basophils Relative: 0 %
Eosinophils Absolute: 0 10*3/uL (ref 0.0–0.5)
Eosinophils Relative: 0 %
HCT: 35.3 % — ABNORMAL LOW (ref 39.0–52.0)
Hemoglobin: 12.2 g/dL — ABNORMAL LOW (ref 13.0–17.0)
Immature Granulocytes: 1 %
Lymphocytes Relative: 9 %
Lymphs Abs: 0.7 10*3/uL (ref 0.7–4.0)
MCH: 30.2 pg (ref 26.0–34.0)
MCHC: 34.6 g/dL (ref 30.0–36.0)
MCV: 87.4 fL (ref 80.0–100.0)
Monocytes Absolute: 0.7 10*3/uL (ref 0.1–1.0)
Monocytes Relative: 9 %
Neutro Abs: 6.4 10*3/uL (ref 1.7–7.7)
Neutrophils Relative %: 81 %
Platelets: 243 10*3/uL (ref 150–400)
RBC: 4.04 MIL/uL — ABNORMAL LOW (ref 4.22–5.81)
RDW: 12.9 % (ref 11.5–15.5)
WBC: 7.9 10*3/uL (ref 4.0–10.5)
nRBC: 0 % (ref 0.0–0.2)

## 2020-09-13 LAB — URINALYSIS, COMPLETE (UACMP) WITH MICROSCOPIC
Bilirubin Urine: NEGATIVE
Glucose, UA: NEGATIVE mg/dL
Ketones, ur: NEGATIVE mg/dL
Leukocytes,Ua: NEGATIVE
Nitrite: NEGATIVE
Protein, ur: NEGATIVE mg/dL
RBC / HPF: >50 RBC/hpf — ABNORMAL HIGH (ref 0–5)
Specific Gravity, Urine: 1.017 (ref 1.005–1.030)
pH: 5 (ref 5.0–8.0)

## 2020-09-13 LAB — COMPREHENSIVE METABOLIC PANEL
ALT: 15 U/L (ref 0–44)
AST: 22 U/L (ref 15–41)
Albumin: 2.8 g/dL — ABNORMAL LOW (ref 3.5–5.0)
Alkaline Phosphatase: 58 U/L (ref 38–126)
Anion gap: 11 (ref 5–15)
BUN: 48 mg/dL — ABNORMAL HIGH (ref 8–23)
CO2: 28 mmol/L (ref 22–32)
Calcium: 8.5 mg/dL — ABNORMAL LOW (ref 8.9–10.3)
Chloride: 102 mmol/L (ref 98–111)
Creatinine, Ser: 1.63 mg/dL — ABNORMAL HIGH (ref 0.61–1.24)
GFR, Estimated: 44 mL/min — ABNORMAL LOW (ref >60)
Glucose, Bld: 107 mg/dL — ABNORMAL HIGH (ref 70–99)
Potassium: 3.2 mmol/L — ABNORMAL LOW (ref 3.5–5.1)
Sodium: 141 mmol/L (ref 135–145)
Total Bilirubin: 0.5 mg/dL (ref 0.3–1.2)
Total Protein: 6.3 g/dL — ABNORMAL LOW (ref 6.5–8.1)

## 2020-09-13 LAB — MAGNESIUM: Magnesium: 2.1 mg/dL (ref 1.7–2.4)

## 2020-09-13 LAB — FIBRIN DERIVATIVES D-DIMER (ARMC ONLY): Fibrin derivatives D-dimer (ARMC): 1057.12 ng/mL (FEU) — ABNORMAL HIGH (ref 0.00–499.00)

## 2020-09-13 LAB — PHOSPHORUS: Phosphorus: 3.6 mg/dL (ref 2.5–4.6)

## 2020-09-13 LAB — C-REACTIVE PROTEIN: CRP: 5.1 mg/dL — ABNORMAL HIGH (ref <1.0)

## 2020-09-13 LAB — FERRITIN: Ferritin: 251 ng/mL (ref 24–336)

## 2020-09-13 MED ORDER — SODIUM CHLORIDE 0.9 % IV SOLN: INTRAVENOUS | Status: DC

## 2020-09-13 MED ORDER — POTASSIUM CHLORIDE 10 MEQ/100ML IV SOLN
10.0000 meq | INTRAVENOUS | Status: AC
  Administered 2020-09-13 (×2): 10 meq via INTRAVENOUS
  Filled 2020-09-13 (×2): qty 100

## 2020-09-13 MED ORDER — METHYLPREDNISOLONE SODIUM SUCC 125 MG IJ SOLR
0.5000 mg/kg | Freq: Two times a day (BID) | INTRAMUSCULAR | Status: DC
  Administered 2020-09-13 – 2020-09-15 (×4): 53.125 mg via INTRAVENOUS
  Filled 2020-09-13 (×4): qty 2

## 2020-09-13 MED ORDER — GUAIFENESIN ER 600 MG PO TB12
1200.0000 mg | ORAL_TABLET | Freq: Two times a day (BID) | ORAL | Status: DC
  Administered 2020-09-13 – 2020-09-16 (×7): 1200 mg via ORAL
  Filled 2020-09-13 (×7): qty 2

## 2020-09-13 NOTE — Progress Notes (Signed)
Pt presents with an increased level of anxiety. Pt voided and was noted with gross hematuria. MD notified and new orders have been rendered and carried out with pending UA results.

## 2020-09-13 NOTE — Progress Notes (Signed)
Patient is alert and oriented this morning. Has a nonproductive cough that he stated feels has gotten a little worse over the last few days. His oxygen levels have been around 88-90% RA. When instructed to apply oxygen at 3L [place]. He states he had it on all lastnigjt and does not want it on at the moment. I did mention that it would help him and that its important to keep oxygen levels up so that he could move around better and get perfusion throughout body. He stated right now he didn't want it that he wanted a break from it. He is currently resting in bed with call bell in reach.

## 2020-09-13 NOTE — Progress Notes (Addendum)
[facility]  PROGRESS NOTE  MATRIX SCHWIND [id number] DOB: Jun 24, 1946 DOA: 09/11/2020 PCP: Tracie Harrier, MD   Brief HPI:   75 yr old male  With medical history of Non hodgkin lymphoma currently in remission, hiatal hernia, hypertension, gout who was diagnosed with Covid 19 viral infection on 09/04/2020 at [place].  Patient felt extremely weak after he was diagnosed with COVID-19, he also had diarrhea, anorexia, cough productive of clear phlegm and nausea.  Denies vomiting.  Patient had worsening symptoms he was asked by his PCP to go to ED for further evaluation.  Vaccination status-unvaccinated  In the ED chest x-ray showed possible lobar atelectasis of right lower lobe, mild right basilar opacity noted laterally concerning for subsegmental atelectasis or possibly infiltrate.  CT chest did not show evidence of PE.  CT scan of the head showed no acute intracranial abnormality.   Subjective   Patient seen and examined, he has developed nonproductive cough this morning. O2 sats 88 to 90%  on room air.   Assessment/Plan:     1. Gastroenteritis-secondary to COVID-19 infection.  Patient is not vaccinated against COVID-19.  Patient does not have respiratory symptoms but predominantly GI symptoms.  At this time both vomiting and diarrhea has resolved.  Patient is currently tolerating diet well.   2. COVID-19 infection-patient has developed cough, O2 sats 88 to 90% on room air. CRP was 9.9 yesterday. He was started on remdesivir per pharmacy. Will initiate Solu-Medrol 0.5 mg IV every 2 hours. Will obtain chest x-ray today. Will start Mucinex 1200 mg p.o. twice daily 3. Acute kidney injury-patient baseline creatinine is around 0.9-1.  He presents with creatinine 1.29 which went up to 1.77 likely from dehydration/diarrhe versus contrast-induced nephropathy. He was started on IV normal saline at 75 mill per hour. Creatinine down to 1.68 today. We will avoid giving further IV fluids  as patient respiratory status has worsened slightly. Follow BMP in am.  4. Hypokalemia-potassium is 3.2, replace potassium and follow BMP in am. 5. Generalized weakness-near-syncope.  Likely from malnutrition from [place].  Losartan, HCTZ, Coreg on hold. 6. Hypokalemia-replete 7. History of CAD-continue aspirin, statin     COVID-19 Labs  Recent Labs    09/12/20 0433 09/13/20 0621  FERRITIN 268 251  CRP 9.9*  --     Lab Results  Component Value Date   SARSCOV2NAA NEGATIVE 02/21/2020   [place] NEGATIVE 05/15/2019     Scheduled medications:   . vitamin C  500 mg Oral Daily  . aspirin EC  81 mg Oral Daily  . enoxaparin (LOVENOX) injection  40 mg Subcutaneous Q24H  . feeding supplement  237 mL Oral BID BM  . gabapentin  300 mg Oral QHS  . guaiFENesin  1,200 mg Oral BID  . pravastatin  40 mg Oral QHS  . Ensure Max Protein  11 oz Oral Daily  . zinc sulfate  220 mg Oral Daily  . zolpidem  5 mg Oral QHS         CBG: No results for input(s): GLUCAP in the last 168 hours.  SpO2: 92 % O2 Flow Rate (L/min): 3 L/min    CBC: Recent Labs  Lab 09/11/20 1330 09/12/20 0433 09/13/20 0621  WBC 7.3 4.2 7.9  NEUTROABS 6.3 3.5 6.4  HGB 13.7 12.5* 12.2*  HCT 39.4 37.0* 35.3*  MCV 86.6 87.3 87.4  PLT 234 222 0000000    Basic Metabolic Panel: Recent Labs  Lab 09/11/20 1330 09/11/20 1520 09/12/20 0433 09/13/20 [id number]  NA 135  --  136 141  K 2.9*  --  3.6 3.2*  CL 94*  --  99 102  CO2 29  --  27 28  GLUCOSE 143*  --  171* 107*  BUN 17  --  28* 48*  CREATININE 1.29*  --  1.77* 1.63*  CALCIUM 8.6*  --  8.5* 8.5*  MG  --  1.9 1.8 2.1  PHOS  --   --  3.9 3.6     Liver Function Tests: Recent Labs  Lab 09/11/20 1330 09/12/20 0433 09/13/20 0621  AST 28 23 22   ALT 17 17 15   ALKPHOS 62 58 58  BILITOT 0.9 0.6 0.5  PROT 7.2 6.6 6.3*  ALBUMIN 3.3* 3.0* 2.8*     Antibiotics: Anti-infectives (From admission, onward)   Start     Dose/Rate Route Frequency Ordered  Stop   09/12/20 1000  remdesivir 100 mg in sodium chloride 0.9 % 100 mL IVPB       "Followed by" Linked Group Details   100 mg 200 mL/hr over 30 Minutes Intravenous Daily 09/11/20 1508 09/16/20 0959   09/12/20 1000  remdesivir 100 mg in sodium chloride 0.9 % 100 mL IVPB  Status:  Discontinued       "Followed by" Linked Group Details   100 mg 200 mL/hr over 30 Minutes Intravenous Daily 09/11/20 1513 09/11/20 1516   09/11/20 1600  remdesivir 200 mg in sodium chloride 0.9% 250 mL IVPB       "Followed by" Linked Group Details   200 mg 580 mL/hr over 30 Minutes Intravenous Once 09/11/20 1508 09/11/20 2200   09/11/20 1600  remdesivir 200 mg in sodium chloride 0.9% 250 mL IVPB  Status:  Discontinued       "Followed by" Linked Group Details   200 mg 580 mL/hr over 30 Minutes Intravenous Once 09/11/20 1513 09/11/20 1516       DVT prophylaxis: Lovenox  Code Status: Full code  Family Communication: No family at bedside   Consultants:    Procedures:      Objective   Vitals:   09/13/20 0036 09/13/20 0409 09/13/20 0805 09/13/20 1133  BP:  131/71 137/69 130/70  Pulse:  (!) 51 (!) 50 (!) 52  Resp:  18 20 20   Temp:  98 F (36.7 C) 98.4 F (36.9 C) 97.7 F (36.5 C)  TempSrc:  Oral  Oral  SpO2: (!) 81% 95% (!) 89% 92%  Weight:      Height:        Intake/Output Summary (Last 24 hours) at 09/13/2020 1355 Last data filed at 09/13/2020 0407 Gross per 24 hour  Intake --  Output 300 ml  Net -300 ml    02/03 1901 - 02/05 0700 In: -  Out: 1000 [Urine:1000]  Filed Weights   09/11/20 1323 09/11/20 1658  Weight: 105.7 kg 105.7 kg    Physical Examination:    General-appears in no acute distress  Heart-S1-S2, regular, no murmur auscultated  Lungs-clear to auscultation bilaterally, no wheezing or crackles auscultated  Abdomen-soft, nontender, no organomegaly  Extremities-no edema in the lower extremities  Neuro-alert, oriented x3, no focal deficit noted   Status  is: Inpatient  Dispo: The patient is from: Home              Anticipated d/c is to: Home              Anticipated d/c date is: 09/13/2020  Patient currently not medically stable for discharge  Barrier to discharge-treatment for COVID-19 infection, diarrhea      Data Reviewed:   No results found for this or any previous visit (from the past 240 hour(s)).  No results for input(s): LIPASE, AMYLASE in the last 168 hours. No results for input(s): AMMONIA in the last 168 hours.  Cardiac Enzymes: Recent Labs  Lab 09/11/20 1520  CKTOTAL 155    Studies:  CT Head Wo Contrast  Result Date: 09/11/2020 CLINICAL DATA:  Mental status changes in a patient with reported COVID-19 infection at age 58. EXAM: CT HEAD WITHOUT CONTRAST TECHNIQUE: Contiguous axial images were obtained from the base of the skull through the vertex without intravenous contrast. COMPARISON:  February 2015 and MRI from 2019. FINDINGS: Brain: No evidence of acute infarction, hemorrhage, hydrocephalus, extra-axial collection or mass lesion/mass effect. Signs of atrophy and chronic microvascular ischemic change similar to prior MRI. Vascular: No hyperdense vessel or unexpected calcification. Skull: Normal. Negative for fracture or focal lesion. Sinuses/Orbits: Extensive LEFT ethmoid and maxillary sinus disease increased since the previous MRI where there was significant sinus disease, likely with acute component and with resolution of sinus disease within the visualized RIGHT maxillary sinus when compared to more remote imaging. Other: None. IMPRESSION: 1. No acute intracranial abnormality. 2. Signs of atrophy and chronic microvascular ischemic change similar to prior MRI. 3. Sinus disease increased on the LEFT and potentially with acute component and improved on the RIGHT when compared to 2019 evaluation. Electronically Signed   By: Zetta Bills M.D.   On: 09/11/2020 14:47   CT Angio Chest PE W and/or Wo  Contrast  Result Date: 09/11/2020 CLINICAL DATA:  Near syncope, cough, elevated troponin, COVID positive EXAM: CT ANGIOGRAPHY CHEST WITH CONTRAST TECHNIQUE: Multidetector CT imaging of the chest was performed using the standard protocol during bolus administration of intravenous contrast. Multiplanar CT image reconstructions and MIPs were obtained to evaluate the vascular anatomy. CONTRAST:  12mL OMNIPAQUE IOHEXOL 350 MG/ML SOLN COMPARISON:  Same day chest radiograph, and PET-CT January 09, 2018. FINDINGS: Cardiovascular: Satisfactory opacification of the pulmonary arteries to the segmental level. No evidence of pulmonary embolism. Coronary artery calcifications. Aortic atherosclerosis. Left-sided cardiac enlargement. No pericardial effusion. Mediastinum/Nodes: No discrete thyroid nodules. No mediastinal, hilar or axillary lymphadenopathy. Trachea and esophagus are unremarkable. Lungs/Pleura: Increased size of the small right pleural effusion with new tiny left pleural effusion. Similar right lower lobe round atelectasis adjacent to this there is a new scattered bilateral subsegmental atelectasis. Juxtapleural round opacity in the lateral right lower lobe. Upper Abdomen: Similar stranding along the celiac and SMA arteries. Cholelithiasis without findings of cholecystitis. Similar nodular thickening of the left adrenal gland follow-up. Musculoskeletal: No chest wall abnormality. No acute or significant osseous findings. Multilevel degenerative changes spine. Review of the MIP images confirms the above findings. IMPRESSION: 1. No evidence of pulmonary embolism. 2. Increased size of small right pleural effusion with new tiny left pleural effusion. 3. Similar right lower lobe round atelectasis adjacent to this there is a new scattered bilateral subsegmental atelectasis. Additional new juxtapleural round opacity in the lateral right lower lobe is nonspecific favored to represent represent another focus of round atelectasis  atelectasis or most likely pneumonia. Recommend short interval follow-up CT in 3 months assess stability. 4. Stranding along the celiac and SMA arteries, similar prior PET-CT. 5. Cholelithiasis without findings of cholecystitis. 6. Aortic atherosclerosis. Aortic Atherosclerosis (ICD10-I70.0). Electronically Signed   By: Dahlia Bailiff MD   On: 09/11/2020  15:55   DG Chest Portable 1 View  Result Date: 09/11/2020 CLINICAL DATA:  Shortness of breath. EXAM: PORTABLE CHEST 1 VIEW COMPARISON:  September 18, 2013. FINDINGS: Stable cardiomediastinal silhouette. No pneumothorax is noted. Left lung is clear. Possible lobar atelectasis of right lower lobe is noted. Mild right basilar opacity is noted laterally concerning for subsegmental atelectasis or possibly infiltrate. Bony thorax is unremarkable. IMPRESSION: Possible lobar atelectasis of right lower lobe. Mild right basilar opacity is noted laterally concerning for subsegmental atelectasis or possibly infiltrate. Follow-up radiographs are recommended. Electronically Signed   By: Marijo Conception M.D.   On: 09/11/2020 14:14       [place]   [facility] If 7PM-7AM, please contact night-coverage at [email or web address], Office  [phone]   09/13/2020, 1:55 PM  LOS: 2 days

## 2020-09-13 NOTE — Progress Notes (Signed)
Patient oxygen levels dips throughout the night while asleep. Oxygen saturation was 84% and he was not able to rebound. Oxygen has been applied at 3l via [place]. Pt states he is more comfortable with the supplemental oxygen and Oxygen saturations remain WNL.

## 2020-09-14 ENCOUNTER — Inpatient Hospital Stay: Payer: Medicare HMO

## 2020-09-14 DIAGNOSIS — R55 Syncope and collapse: Secondary | ICD-10-CM

## 2020-09-14 DIAGNOSIS — R531 Weakness: Secondary | ICD-10-CM | POA: Diagnosis not present

## 2020-09-14 DIAGNOSIS — U071 COVID-19: Secondary | ICD-10-CM | POA: Diagnosis not present

## 2020-09-14 DIAGNOSIS — I25118 Atherosclerotic heart disease of native coronary artery with other forms of angina pectoris: Secondary | ICD-10-CM | POA: Diagnosis not present

## 2020-09-14 LAB — COMPREHENSIVE METABOLIC PANEL
ALT: 19 U/L (ref 0–44)
AST: 22 U/L (ref 15–41)
Albumin: 2.9 g/dL — ABNORMAL LOW (ref 3.5–5.0)
Alkaline Phosphatase: 62 U/L (ref 38–126)
Anion gap: 9 (ref 5–15)
BUN: 43 mg/dL — ABNORMAL HIGH (ref 8–23)
CO2: 26 mmol/L (ref 22–32)
Calcium: 8.5 mg/dL — ABNORMAL LOW (ref 8.9–10.3)
Chloride: 102 mmol/L (ref 98–111)
Creatinine, Ser: 1.14 mg/dL (ref 0.61–1.24)
GFR, Estimated: >60 mL/min (ref >60)
Glucose, Bld: 156 mg/dL — ABNORMAL HIGH (ref 70–99)
Potassium: 3.8 mmol/L (ref 3.5–5.1)
Sodium: 137 mmol/L (ref 135–145)
Total Bilirubin: 0.7 mg/dL (ref 0.3–1.2)
Total Protein: 6.6 g/dL (ref 6.5–8.1)

## 2020-09-14 LAB — CBC WITH DIFFERENTIAL/PLATELET
Abs Immature Granulocytes: 0.06 10*3/uL (ref 0.00–0.07)
Basophils Absolute: 0 10*3/uL (ref 0.0–0.1)
Basophils Relative: 0 %
Eosinophils Absolute: 0 10*3/uL (ref 0.0–0.5)
Eosinophils Relative: 0 %
HCT: 35.8 % — ABNORMAL LOW (ref 39.0–52.0)
Hemoglobin: 12 g/dL — ABNORMAL LOW (ref 13.0–17.0)
Immature Granulocytes: 1 %
Lymphocytes Relative: 6 %
Lymphs Abs: 0.4 10*3/uL — ABNORMAL LOW (ref 0.7–4.0)
MCH: 29.5 pg (ref 26.0–34.0)
MCHC: 33.5 g/dL (ref 30.0–36.0)
MCV: 88 fL (ref 80.0–100.0)
Monocytes Absolute: 0.3 10*3/uL (ref 0.1–1.0)
Monocytes Relative: 4 %
Neutro Abs: 7.1 10*3/uL (ref 1.7–7.7)
Neutrophils Relative %: 89 %
Platelets: 292 10*3/uL (ref 150–400)
RBC: 4.07 MIL/uL — ABNORMAL LOW (ref 4.22–5.81)
RDW: 12.5 % (ref 11.5–15.5)
WBC: 7.9 10*3/uL (ref 4.0–10.5)
nRBC: 0 % (ref 0.0–0.2)

## 2020-09-14 LAB — C-REACTIVE PROTEIN: CRP: 5.2 mg/dL — ABNORMAL HIGH (ref <1.0)

## 2020-09-14 LAB — MAGNESIUM: Magnesium: 2 mg/dL (ref 1.7–2.4)

## 2020-09-14 LAB — FIBRIN DERIVATIVES D-DIMER (ARMC ONLY): Fibrin derivatives D-dimer (ARMC): 1089.37 ng/mL (FEU) — ABNORMAL HIGH (ref 0.00–499.00)

## 2020-09-14 LAB — FERRITIN: Ferritin: 239 ng/mL (ref 24–336)

## 2020-09-14 LAB — PHOSPHORUS: Phosphorus: 3.7 mg/dL (ref 2.5–4.6)

## 2020-09-14 MED ORDER — ENOXAPARIN SODIUM 60 MG/0.6ML ~~LOC~~ SOLN
0.5000 mg/kg | SUBCUTANEOUS | Status: DC
  Administered 2020-09-14: 52.5 mg via SUBCUTANEOUS
  Filled 2020-09-14: qty 0.6

## 2020-09-14 NOTE — Progress Notes (Signed)
Spoke with patient's wife, Camile about patient situation and discharge plans. He has walked twice in hall today with rolling walker. Patient safe and alert. Bed in low position and call bell in reach.

## 2020-09-14 NOTE — Progress Notes (Signed)
[facility]  PROGRESS NOTE  John Gallegos ZOX:[id number] DOB: 12/05/1945 DOA: 09/11/2020 PCP: Tracie Harrier, MD   Brief HPI:   75 yr old male  With medical history of Non hodgkin lymphoma currently in remission, hiatal hernia, hypertension, gout who was diagnosed with Covid 19 viral infection on 09/04/2020 at [place].  Patient felt extremely weak after he was diagnosed with COVID-19, he also had diarrhea, anorexia, cough productive of clear phlegm and nausea.  Denies vomiting.  Patient had worsening symptoms he was asked by his PCP to go to ED for further evaluation.  Vaccination status-unvaccinated  In the ED chest x-ray showed possible lobar atelectasis of right lower lobe, mild right basilar opacity noted laterally concerning for subsegmental atelectasis or possibly infiltrate.  CT chest did not show evidence of PE.  CT scan of the head showed no acute intracranial abnormality.   Subjective   Patient seen and examined, had 1 episode of hematuria last night.  He had a few drops of blood noted after he urinated.   Assessment/Plan:     1. Gastroenteritis-secondary to COVID-19 infection.  Patient is not vaccinated against COVID-19.  Patient does not have respiratory symptoms but predominantly GI symptoms.  At this time both vomiting and diarrhea has resolved.  Patient is currently tolerating diet well.   2. Hematuria-UA obtained yesterday showed greater than 50 RBCs per high-power field.  Called and discussed with urologist on-call Dr Bernardo Heater, he recommends that patient can be seen by urology as outpatient for cystoscopy.  No intervention needed at this time.  CT stone protocol obtained shows no nephrolithiasis.  No hydronephrosis noted. 3. COVID-19 infection-patient has developed cough, O2 sats 88 to 90% on room air. CRP was 9.9 yesterday. He was started on remdesivir per pharmacy.  Patient started on  Solu-Medrol 0.5 mg IV every 24 hours.  Chest x-ray shows infiltrate  versus atelectasis.  Continue Mucinex 1200 mg p.o. twice daily.  Currently is not requiring oxygen, O2 sats 94% on room air.  CRP is down to 5.2. 4. Acute kidney injury-patient baseline creatinine is around 0.9-1.  He presents with creatinine 1.29 which went up to 1.77 likely from dehydration/diarrhe versus contrast-induced nephropathy. He was started on IV normal saline at 75 mill per hour. Creatinine down to 1.14 today.  IV fluids were discontinued yesterday.  Patient eating diet well.    5. Hypokalemia-replete 6. Generalized weakness-near-syncope.  Likely from dehydration from GI losses, losartan, HCTZ, Coreg on hold. 7. Hypokalemia-replete 8. History of CAD-continue aspirin, statin     COVID-19 Labs  Recent Labs    09/12/20 0433 09/13/20 0621 09/14/20 0309  FERRITIN 268 251 239  CRP 9.9* 5.1* 5.2*    Lab Results  Component Value Date   SARSCOV2NAA NEGATIVE 02/21/2020   [place] NEGATIVE 05/15/2019     Scheduled medications:   . vitamin C  500 mg Oral Daily  . aspirin EC  81 mg Oral Daily  . enoxaparin (LOVENOX) injection  0.5 mg/kg Subcutaneous Q24H  . feeding supplement  237 mL Oral BID BM  . gabapentin  300 mg Oral QHS  . guaiFENesin  1,200 mg Oral BID  . methylPREDNISolone (SOLU-MEDROL) injection  0.5 mg/kg Intravenous Q12H  . pravastatin  40 mg Oral QHS  . Ensure Max Protein  11 oz Oral Daily  . zinc sulfate  220 mg Oral Daily  . zolpidem  5 mg Oral QHS         CBG: No results for input(s): GLUCAP  in the last 168 hours.  SpO2: 94 % O2 Flow Rate (L/min): 3 L/min    CBC: Recent Labs  Lab 09/11/20 1330 09/12/20 0433 09/13/20 0621 09/14/20 0309  WBC 7.3 4.2 7.9 7.9  NEUTROABS 6.3 3.5 6.4 7.1  HGB 13.7 12.5* 12.2* 12.0*  HCT 39.4 37.0* 35.3* 35.8*  MCV 86.6 87.3 87.4 88.0  PLT 234 222 243 671    Basic Metabolic Panel: Recent Labs  Lab 09/11/20 1330 09/11/20 1520 09/12/20 0433 09/13/20 0621 09/14/20 0309  NA 135  --  136 141 137  K  2.9*  --  3.6 3.2* 3.8  CL 94*  --  99 102 102  CO2 29  --  27 28 26   GLUCOSE 143*  --  171* 107* 156*  BUN 17  --  28* 48* 43*  CREATININE 1.29*  --  1.77* 1.63* 1.14  CALCIUM 8.6*  --  8.5* 8.5* 8.5*  MG  --  1.9 1.8 2.1 2.0  PHOS  --   --  3.9 3.6 3.7     Liver Function Tests: Recent Labs  Lab 09/11/20 1330 09/12/20 0433 09/13/20 0621 09/14/20 0309  AST 28 23 22 22   ALT 17 17 15 19   ALKPHOS 62 58 58 62  BILITOT 0.9 0.6 0.5 0.7  PROT 7.2 6.6 6.3* 6.6  ALBUMIN 3.3* 3.0* 2.8* 2.9*     Antibiotics: Anti-infectives (From admission, onward)   Start     Dose/Rate Route Frequency Ordered Stop   09/12/20 1000  remdesivir 100 mg in sodium chloride 0.9 % 100 mL IVPB       "Followed by" Linked Group Details   100 mg 200 mL/hr over 30 Minutes Intravenous Daily 09/11/20 1508 09/16/20 0959   09/12/20 1000  remdesivir 100 mg in sodium chloride 0.9 % 100 mL IVPB  Status:  Discontinued       "Followed by" Linked Group Details   100 mg 200 mL/hr over 30 Minutes Intravenous Daily 09/11/20 1513 09/11/20 1516   09/11/20 1600  remdesivir 200 mg in sodium chloride 0.9% 250 mL IVPB       "Followed by" Linked Group Details   200 mg 580 mL/hr over 30 Minutes Intravenous Once 09/11/20 1508 09/11/20 2200   09/11/20 1600  remdesivir 200 mg in sodium chloride 0.9% 250 mL IVPB  Status:  Discontinued       "Followed by" Linked Group Details   200 mg 580 mL/hr over 30 Minutes Intravenous Once 09/11/20 1513 09/11/20 1516       DVT prophylaxis: Lovenox  Code Status: Full code  Family Communication: No family at bedside   Consultants:    Procedures:      Objective   Vitals:   09/13/20 2102 09/14/20 0543 09/14/20 0719 09/14/20 1111  BP: (!) 156/95 (!) 145/75 140/77 (!) 147/78  Pulse: (!) 57 (!) 57 (!) 55 (!) 57  Resp: 16 16 15 15   Temp: 97.9 F (36.6 C) 98.4 F (36.9 C) 97.7 F (36.5 C) 98.3 F (36.8 C)  TempSrc: Oral Oral Oral Oral  SpO2: 95% 91% 90% 94%  Weight:       Height:        Intake/Output Summary (Last 24 hours) at 09/14/2020 1256 Last data filed at 09/13/2020 1615 Gross per 24 hour  Intake 300 ml  Output 900 ml  Net -600 ml    02/04 1901 - 02/06 0700 In: 300  Out: 1200 [Urine:1200]  Filed Weights   09/11/20 1323 09/11/20  1658  Weight: 105.7 kg 105.7 kg    Physical Examination:    General-appears in no acute distress  Heart-S1-S2, regular, no murmur auscultated  Lungs-clear to auscultation bilaterally, no wheezing or crackles auscultated  Abdomen-soft, nontender, no organomegaly  Extremities-no edema in the lower extremities  Neuro-alert, oriented x3, no focal deficit noted   Status is: Inpatient  Dispo: The patient is from: Home              Anticipated d/c is to: Home              Anticipated d/c date is: 09/16/2020              Patient currently not medically stable for discharge  Barrier to discharge-treatment for COVID-19 infection, diarrhea      Data Reviewed:   No results found for this or any previous visit (from the past 240 hour(s)).  No results for input(s): LIPASE, AMYLASE in the last 168 hours. No results for input(s): AMMONIA in the last 168 hours.  Cardiac Enzymes: Recent Labs  Lab 09/11/20 1520  CKTOTAL 155    Studies:  DG Chest Port 1V same Day  Result Date: 09/13/2020 CLINICAL DATA:  COVID-19 pneumonia. EXAM: PORTABLE CHEST 1 VIEW COMPARISON:  September 11, 2020. FINDINGS: Stable cardiomediastinal silhouette. No pneumothorax or pleural effusion is noted. Stable right basilar atelectasis or infiltrate is noted. Interval development of mild left basilar subsegmental atelectasis. Bony thorax is unremarkable. IMPRESSION: Stable right basilar atelectasis or infiltrate. Interval development of mild left basilar subsegmental atelectasis. Electronically Signed   By: Marijo Conception M.D.   On: 09/13/2020 14:59   CT RENAL STONE STUDY  Result Date: 09/14/2020 CLINICAL DATA:  Hematuria EXAM: CT ABDOMEN  AND PELVIS WITHOUT CONTRAST TECHNIQUE: Multidetector CT imaging of the abdomen and pelvis was performed following the standard protocol without IV contrast. COMPARISON:  April 20, 2017, November 07, 2017, September 11, 2020 FINDINGS: Lower chest: Small RIGHT pleural effusion with pleural thickening. RIGHT lower lobe rounded atelectasis. Second area of consolidative opacity is unchanged since February 3rd. Three-vessel coronary artery atherosclerotic calcifications. Hepatobiliary: Cholelithiasis. Unremarkable noncontrast appearance of the liver. Pancreas: No new peripancreatic fat stranding. Spleen: Unremarkable Adrenals/Urinary Tract: Adrenal glands are unremarkable. No hydronephrosis. No nephrolithiasis. Exophytic cyst at the superior pole of the RIGHT kidney. There is some streaky hyperdensity within the kidneys likely reflecting retained contrast. Bladder is unremarkable. Stomach/Bowel: Stomach is unremarkable. No evidence of bowel obstruction. Scattered diverticulosis. Vascular/Lymphatic: Revisualization of ill-defined soft tissue encasing the SMA and upper mesentery. This is unchanged in comparison to remote prior CTs. Atherosclerotic calcifications of the aorta, severe. Reproductive: Prostatomegaly. Other: No free air or free fluid. Sequela of injection in the anterior abdominal soft tissues. Musculoskeletal: Degenerative changes of bilateral hips in the thoracolumbar spine. IMPRESSION: 1. No nephrolithiasis or hydronephrosis.  There is prostatomegaly. 2. Delayed excretion of contrast in the kidneys could reflect underlying renal dysfunction. 3. Revisualization of ill-defined soft tissue encasing the SMA and upper mesentery. This is unchanged in comparison to remote prior CTs. Aortic Atherosclerosis (ICD10-I70.0). Electronically Signed   By: Valentino Saxon MD   On: 09/14/2020 11:45       Osw[facility] Hillock   [facility] If 7PM-7AM, please contact night-coverage at [email or web address], Office   [phone]   09/14/2020, 12:56 PM  LOS: 3 days

## 2020-09-14 NOTE — Progress Notes (Signed)
PHARMACIST - PHYSICIAN COMMUNICATION  CONCERNING:  Enoxaparin (Lovenox) for DVT Prophylaxis    RECOMMENDATION: Patient was prescribed enoxaparin 40mg  q24 hours for VTE prophylaxis.   Filed Weights   09/11/20 1323 09/11/20 1658  Weight: 105.7 kg (233 lb) 105.7 kg (233 lb)    Body mass index is 30.74 kg/m.  Estimated Creatinine Clearance: 72.5 mL/min (by C-G formula based on SCr of 1.14 mg/dL).   Based on [place] patient is candidate for enoxaparin 0.5mg /kg TBW SQ every 24 hours based on BMI being >30.  DESCRIPTION: Pharmacy has adjusted enoxaparin dose per [facility] policy.  Patient is now receiving enoxaparin 52.5 mg every 24 hours   Benita Gutter 09/14/2020 7:42 AM

## 2020-09-14 NOTE — TOC Progression Note (Addendum)
Transition of Care [facility]) - Progression Note    Patient Details  Name: John Gallegos MRN: [id number] Date of Birth: [birth year]  [facility] CM/SW Contact  Izola Price, RN Phone Number: 09/14/2020, 12:27 PM  Clinical Narrative:   09/14/20 [facility] Tuesday. Lives [place]. HH PT with RW recommended. Requested HH/and DME orders to prepare for DC planning. Simmie Davies RN CM         Expected Discharge Plan and Services                                                 Social Determinants of Health (SDOH) Interventions    Readmission Risk Interventions No flowsheet data found.

## 2020-09-15 DIAGNOSIS — A0839 Other viral enteritis: Secondary | ICD-10-CM | POA: Diagnosis not present

## 2020-09-15 DIAGNOSIS — I10 Essential (primary) hypertension: Secondary | ICD-10-CM | POA: Diagnosis not present

## 2020-09-15 DIAGNOSIS — R55 Syncope and collapse: Secondary | ICD-10-CM | POA: Diagnosis not present

## 2020-09-15 DIAGNOSIS — U071 COVID-19: Secondary | ICD-10-CM | POA: Diagnosis not present

## 2020-09-15 LAB — CBC WITH DIFFERENTIAL/PLATELET
Abs Immature Granulocytes: 0.1 10*3/uL — ABNORMAL HIGH (ref 0.00–0.07)
Basophils Absolute: 0 10*3/uL (ref 0.0–0.1)
Basophils Relative: 0 %
Eosinophils Absolute: 0 10*3/uL (ref 0.0–0.5)
Eosinophils Relative: 0 %
HCT: 38.9 % — ABNORMAL LOW (ref 39.0–52.0)
Hemoglobin: 13.1 g/dL (ref 13.0–17.0)
Immature Granulocytes: 1 %
Lymphocytes Relative: 5 %
Lymphs Abs: 0.6 10*3/uL — ABNORMAL LOW (ref 0.7–4.0)
MCH: 29.4 pg (ref 26.0–34.0)
MCHC: 33.7 g/dL (ref 30.0–36.0)
MCV: 87.4 fL (ref 80.0–100.0)
Monocytes Absolute: 0.5 10*3/uL (ref 0.1–1.0)
Monocytes Relative: 4 %
Neutro Abs: 11.6 10*3/uL — ABNORMAL HIGH (ref 1.7–7.7)
Neutrophils Relative %: 90 %
Platelets: 334 10*3/uL (ref 150–400)
RBC: 4.45 MIL/uL (ref 4.22–5.81)
RDW: 12.4 % (ref 11.5–15.5)
WBC: 12.8 10*3/uL — ABNORMAL HIGH (ref 4.0–10.5)
nRBC: 0 % (ref 0.0–0.2)

## 2020-09-15 LAB — COMPREHENSIVE METABOLIC PANEL
ALT: 27 U/L (ref 0–44)
AST: 27 U/L (ref 15–41)
Albumin: 3 g/dL — ABNORMAL LOW (ref 3.5–5.0)
Alkaline Phosphatase: 65 U/L (ref 38–126)
Anion gap: 10 (ref 5–15)
BUN: 40 mg/dL — ABNORMAL HIGH (ref 8–23)
CO2: 30 mmol/L (ref 22–32)
Calcium: 8.9 mg/dL (ref 8.9–10.3)
Chloride: 101 mmol/L (ref 98–111)
Creatinine, Ser: 1.05 mg/dL (ref 0.61–1.24)
GFR, Estimated: >60 mL/min (ref >60)
Glucose, Bld: 157 mg/dL — ABNORMAL HIGH (ref 70–99)
Potassium: 4.3 mmol/L (ref 3.5–5.1)
Sodium: 141 mmol/L (ref 135–145)
Total Bilirubin: 0.6 mg/dL (ref 0.3–1.2)
Total Protein: 6.4 g/dL — ABNORMAL LOW (ref 6.5–8.1)

## 2020-09-15 LAB — FIBRIN DERIVATIVES D-DIMER (ARMC ONLY): Fibrin derivatives D-dimer (ARMC): 836.45 ng/mL (FEU) — ABNORMAL HIGH (ref 0.00–499.00)

## 2020-09-15 LAB — MAGNESIUM: Magnesium: 2.1 mg/dL (ref 1.7–2.4)

## 2020-09-15 LAB — FERRITIN: Ferritin: 230 ng/mL (ref 24–336)

## 2020-09-15 LAB — PHOSPHORUS: Phosphorus: 3 mg/dL (ref 2.5–4.6)

## 2020-09-15 LAB — C-REACTIVE PROTEIN: CRP: 2.9 mg/dL — ABNORMAL HIGH (ref <1.0)

## 2020-09-15 MED ORDER — CARVEDILOL 6.25 MG PO TABS
6.2500 mg | ORAL_TABLET | Freq: Two times a day (BID) | ORAL | Status: DC
  Administered 2020-09-15 – 2020-09-16 (×3): 6.25 mg via ORAL
  Filled 2020-09-15 (×3): qty 1

## 2020-09-15 MED ORDER — PREDNISONE 20 MG PO TABS
40.0000 mg | ORAL_TABLET | Freq: Every day | ORAL | Status: DC
  Administered 2020-09-16: 40 mg via ORAL
  Filled 2020-09-15: qty 2

## 2020-09-15 NOTE — Progress Notes (Signed)
Physical Therapy Treatment Patient Details Name: John Gallegos MRN: [id number] DOB: 08/12/1945 Today's Date: 09/15/2020    History of Present Illness Presented to ER secondary to progressive weakness, lightheadedness, generalized fatigue; admitted for management of viral gastroenteritis secondary to covid-19 virus    PT Comments    Progressive increase in gait distance and overall activity tolerance, performing all mobility tasks with use of RW, cga/close sup from therapist.  Absent episodes of LOB this date; marked improvement in gait fluidity and tolerance.  Good awareness of need for activity pacing and activity modification/cessation based on signs/symptoms of fatigue; able to indep guide activity appropriately throughout session.  Sats >90% on RA at rest and with exertion. Patient hopeful for discharge home next date; continues to benefit from HHPT follow up.    Follow Up Recommendations  Home health PT     Equipment Recommendations  Rolling walker with 5" wheels    Recommendations for Other Services       Precautions / Restrictions Precautions Precautions: Fall Restrictions Weight Bearing Restrictions: No    Mobility  Bed Mobility Overal bed mobility: Modified Independent                Transfers Overall transfer level: Needs assistance Equipment used: Rolling walker (2 wheeled)   Sit to Stand: Supervision            Ambulation/Gait Ambulation/Gait assistance: Supervision Gait Distance (Feet): 275 Feet Assistive device: Rolling walker (2 wheeled)   Gait velocity: 10' walk time, 9-10 seconds   General Gait Details: reciprocal stepping pattern, fair step height/length; maintains balance without buckling or LOB this date.  Slightly improved cadence and gait speed; sats 90-91% on RA.  Good awareness of need for activity modification/cessation as needed.   Stairs             Wheelchair Mobility    Modified Rankin (Stroke Patients Only)        Balance Overall balance assessment: Needs assistance Sitting-balance support: No upper extremity supported;Feet supported Sitting balance-Leahy Scale: Good     Standing balance support: Bilateral upper extremity supported Standing balance-Leahy Scale: Fair                              Cognition Arousal/Alertness: Awake/alert Behavior During Therapy: WFL for tasks assessed/performed Overall Cognitive Status: Within Functional Limits for tasks assessed                                        Exercises Other Exercises Other Exercises: Standing LE therex, 1x10, with RW, cga/close sup for muscular strength/endurance.  Requires seated rest period after each standing therex due to fatigue.    General Comments        Pertinent Vitals/Pain Pain Assessment: No/denies pain    Home Living                      Prior Function            PT Goals (current goals can now be found in the care plan section) Acute Rehab PT Goals Patient Stated Goal: to go home PT Goal Formulation: With patient Time For Goal Achievement: 09/26/20 Potential to Achieve Goals: Good Progress towards PT goals: Progressing toward goals    Frequency    Min 2X/week      PT Plan Current plan remains  appropriate    Co-evaluation              AM-PAC PT "6 Clicks" Mobility   Outcome Measure  Help needed turning from your back to your side while in a flat bed without using bedrails?: None Help needed moving from lying on your back to sitting on the side of a flat bed without using bedrails?: None Help needed moving to and from a bed to a chair (including a wheelchair)?: None Help needed standing up from a chair using your arms (e.g., wheelchair or bedside chair)?: None Help needed to walk in hospital room?: None Help needed climbing 3-5 steps with a railing? : A Little 6 Click Score: 23    End of Session   Activity Tolerance: Patient tolerated  treatment well Patient left: in bed;with call bell/phone within reach Nurse Communication: Mobility status PT Visit Diagnosis: Muscle weakness (generalized) (M62.81);Difficulty in walking, not elsewhere classified (R26.2)     Time: 8850-2774 PT Time Calculation (min) (ACUTE ONLY): 25 min  Charges:  $Gait Training: 8-22 mins $Therapeutic Exercise: 8-22 mins                    Ian Cavey H. Owens Shark, PT, DPT, NCS 09/15/20, 3:52 PM [phone]

## 2020-09-15 NOTE — Care Management Important Message (Signed)
Important Message  Patient Details  Name: John Gallegos MRN: [id number] Date of Birth: 01/11/1946   Medicare Important Message Given:  Yes     Shelbie Hutching, RN 09/15/2020, 11:16 AM

## 2020-09-15 NOTE — Progress Notes (Signed)
Daily update given to [facility]

## 2020-09-15 NOTE — TOC Initial Note (Signed)
Transition of Care [facility]) - Initial/Assessment Note    Patient Details  Name: John Gallegos MRN: [id number] Date of Birth: 24-Oct-1945  Transition of Care [facility]) CM/SW Contact:    Shelbie Hutching, RN Phone Number: 09/15/2020, 2:32 PM  Clinical Narrative:                 Patient admitted to the hospital with [place].  Patient will likely be medically stable for discharge tomorrow.  RNCM was able to speak with patient at the bedside.  Patient reports that he is from home and lives alone.  He says that he and his wife are separated but still very close and she lives in an apartment down from his.  Wife or son will be able to pick patient up at discharge.   Patient agrees to home health at discharge.  [place] with [facility] accepted referral for RN, PT, and OT.   PT has recommended for patient to have a walker, patient agrees to getting a walker.  [employer] will provide walker and deliver to the patient's room.     Expected Discharge Plan: [place] Barriers to Discharge: Continued Medical Work up   Patient Goals and CMS Choice Patient states their goals for this hospitalization and ongoing recovery are:: Wants to have access to his medical records- discussed my chart- wants to go home and know what to do once he gets there CMS Medicare.gov Compare Post Acute Care list provided to:: Patient Choice offered to / list presented to : Patient  Expected Discharge Plan and Services Expected Discharge Plan: [place]   Discharge Planning Services: CM Consult Post Acute Care Choice: [place] arrangements for the past 2 months: Apartment                 DME Arranged: [place] Rile DME Agency: [employer] Date DME Agency Contacted: 09/15/20 Time DME Agency Contacted: [phone] Representative spoke with at DME Agency: [place]: RN,PT,OT [place] Agency: [facility] Date [place]: 09/15/20 Time [place]: [place] Representative spoke  with at [place]: [place]  Prior Living Arrangements/Services Living arrangements for the past 2 months: [place] with:: Self Patient language and need for interpreter reviewed:: Yes Do you feel safe going back to the place where you live?: Yes      Need for Family Participation in Patient Care: Yes (Comment) (COVID) Care giver support system in place?: Yes (comment) (wife)   Criminal Activity/Legal Involvement Pertinent to Current Situation/Hospitalization: No - Comment as needed  Activities of Daily Living Home Assistive Devices/Equipment: Cane (specify quad or straight) ADL Screening (condition at time of admission) Patient's cognitive ability adequate to safely complete daily activities?: Yes Is the patient deaf or have difficulty hearing?: Yes Does the patient have difficulty seeing, even when wearing glasses/contacts?: No Does the patient have difficulty concentrating, remembering, or making decisions?: No Patient able to express need for assistance with ADLs?: Yes Does the patient have difficulty dressing or bathing?: No Independently performs ADLs?: Yes (appropriate for developmental age) Does the patient have difficulty walking or climbing stairs?: Yes Weakness of Legs: Both Weakness of Arms/Hands: None  Permission Sought/Granted Permission sought to share information with : Case Manager,Family Supports,Other (comment) Permission granted to share information with : Yes, Verbal Permission Granted  Share Information with NAME: Reeves Forth  Permission granted to share info w AGENCY: home health agency  Permission granted to share info w Relationship: wife     Emotional  Assessment Appearance:: Appears stated age Attitude/Demeanor/Rapport: Engaged Affect (typically observed): Accepting Orientation: : Oriented to Self,Oriented to Place,Oriented to  Time,Oriented to Situation Alcohol / Substance Use: Not Applicable Psych Involvement: No (comment)  Admission diagnosis:   Hypokalemia [E87.6] Elevated troponin [R77.8] Near syncope [R55] COVID-19 [U07.1] Generalized weakness [R53.1] Patient Active Problem List   Diagnosis Date Noted  . Generalized weakness 09/11/2020  . Hiatal hernia   . Gastroenteritis due to COVID-19 virus   . Hypokalemia   . Near syncope   . Dehydration   . Lymphedema 06/03/2020  . Swelling of limb 11/20/2019  . Incarcerated umbilical hernia 13/24/4010  . Hearing loss 10/26/2018  . 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 CAD (coronary artery disease), native coronary artery 08/29/2017  . Chest pain with moderate risk for cardiac etiology 08/27/2017  . Smoker 08/27/2017  . 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 Arthritis 05/10/2017  . Hyperlipidemia, unspecified 05/10/2017  . Hypertension 05/10/2017  . Insomnia 05/10/2017   PCP:  Tracie Harrier, MD Pharmacy:   [facility], [place] [address] [place] [address] Phone: [phone] Fax: [phone]     Social Determinants of Health (SDOH) Interventions    Readmission Risk Interventions No flowsheet data found.

## 2020-09-15 NOTE — Progress Notes (Signed)
[facility]  PROGRESS NOTE  John Gallegos WNI:[id number] DOB: March 24, 1946 DOA: 09/11/2020 PCP: Tracie Harrier, MD   Brief HPI:   75 yr old male  With medical history of Non hodgkin lymphoma currently in remission, hiatal hernia, hypertension, gout who was diagnosed with Covid 19 viral infection on 09/04/2020 at [place].  Patient felt extremely weak after he was diagnosed with COVID-19, he also had diarrhea, anorexia, cough productive of clear phlegm and nausea.  Denies vomiting.  Patient had worsening symptoms he was asked by his PCP to go to ED for further evaluation.  Vaccination status-unvaccinated  In the ED chest x-ray showed possible lobar atelectasis of right lower lobe, mild right basilar opacity noted laterally concerning for subsegmental atelectasis or possibly infiltrate.  CT chest did not show evidence of PE.  CT scan of the head showed no acute intracranial abnormality.   Subjective   Patient seen and examined, denies further episodes of hematuria.   Assessment/Plan:     1. Gastroenteritis-secondary to COVID-19 infection.  Patient is not vaccinated against COVID-19.  Patient does not have respiratory symptoms but predominantly GI symptoms.  At this time both vomiting and diarrhea has resolved.  Patient is currently tolerating diet well.   2. Hematuria-UA obtained yesterday showed greater than 50 RBCs per high-power field.  Called and discussed with urologist on-call Dr Bernardo Heater, he recommends that patient can be seen by urology as outpatient for cystoscopy.  No intervention needed at this time.  CT stone protocol obtained shows no nephrolithiasis.  No hydronephrosis noted. 3. COVID-19 infection-patient has developed cough, O2 sats 88 to 90% on room air. CRP was 9.9 yesterday. He was started on remdesivir per pharmacy.  Patient started on  Solu-Medrol 0.5 mg IV every 24 hours.  Chest x-ray shows infiltrate versus atelectasis.  Continue Mucinex 1200 mg p.o. twice  daily.  Currently is not requiring oxygen, O2 sats 94% on room air.  CRP is down to 2.9 4. Acute kidney injury-resolved, patient baseline creatinine is around 0.9-1.  He presents with creatinine 1.29 which went up to 1.77 likely from dehydration/diarrhe versus contrast-induced nephropathy. He was started on IV normal saline at 75 mill per hour. Creatinine down to 1.05 today.  IV fluids were discontinued yesterday.  Patient eating well.    5. Hypokalemia-replete 6. Generalized weakness-near-syncope.  Likely from dehydration from GI losses, losartan, HCTZ, Coreg on hold. 7. Hypertension-blood pressure is mildly elevated.  Will restart Coreg 8. Hypokalemia-replete 9. History of CAD-continue aspirin, statin     COVID-19 Labs  Recent Labs    09/13/20 0621 09/14/20 0309 09/15/20 0555  FERRITIN 251 239 230  CRP 5.1* 5.2* 2.9*    Lab Results  Component Value Date   SARSCOV2NAA NEGATIVE 02/21/2020   [place] NEGATIVE 05/15/2019     Scheduled medications:   . vitamin C  500 mg Oral Daily  . aspirin EC  81 mg Oral Daily  . enoxaparin (LOVENOX) injection  0.5 mg/kg Subcutaneous Q24H  . feeding supplement  237 mL Oral BID BM  . gabapentin  300 mg Oral QHS  . guaiFENesin  1,200 mg Oral BID  . methylPREDNISolone (SOLU-MEDROL) injection  0.5 mg/kg Intravenous Q12H  . pravastatin  40 mg Oral QHS  . Ensure Max Protein  11 oz Oral Daily  . zinc sulfate  220 mg Oral Daily  . zolpidem  5 mg Oral QHS         CBG: No results for input(s): GLUCAP in the last 168  hours.  SpO2: 94 % O2 Flow Rate (L/min): 3 L/min    CBC: Recent Labs  Lab 09/11/20 1330 09/12/20 0433 09/13/20 0621 09/14/20 0309 09/15/20 0555  WBC 7.3 4.2 7.9 7.9 12.8*  NEUTROABS 6.3 3.5 6.4 7.1 11.6*  HGB 13.7 12.5* 12.2* 12.0* 13.1  HCT 39.4 37.0* 35.3* 35.8* 38.9*  MCV 86.6 87.3 87.4 88.0 87.4  PLT 234 222 243 292 706    Basic Metabolic Panel: Recent Labs  Lab 09/11/20 1330 09/11/20 1520  09/12/20 0433 09/13/20 0621 09/14/20 0309 09/15/20 0555  NA 135  --  136 141 137 141  K 2.9*  --  3.6 3.2* 3.8 4.3  CL 94*  --  99 102 102 101  CO2 29  --  27 28 26 30   GLUCOSE 143*  --  171* 107* 156* 157*  BUN 17  --  28* 48* 43* 40*  CREATININE 1.29*  --  1.77* 1.63* 1.14 1.05  CALCIUM 8.6*  --  8.5* 8.5* 8.5* 8.9  MG  --  1.9 1.8 2.1 2.0 2.1  PHOS  --   --  3.9 3.6 3.7 3.0     Liver Function Tests: Recent Labs  Lab 09/11/20 1330 09/12/20 0433 09/13/20 0621 09/14/20 0309 09/15/20 0555  AST 28 23 22 22 27   ALT 17 17 15 19 27   ALKPHOS 62 58 58 62 65  BILITOT 0.9 0.6 0.5 0.7 0.6  PROT 7.2 6.6 6.3* 6.6 6.4*  ALBUMIN 3.3* 3.0* 2.8* 2.9* 3.0*     Antibiotics: Anti-infectives (From admission, onward)   Start     Dose/Rate Route Frequency Ordered Stop   09/12/20 1000  remdesivir 100 mg in sodium chloride 0.9 % 100 mL IVPB       "Followed by" Linked Group Details   100 mg 200 mL/hr over 30 Minutes Intravenous Daily 09/11/20 1508 09/15/20 0816   09/12/20 1000  remdesivir 100 mg in sodium chloride 0.9 % 100 mL IVPB  Status:  Discontinued       "Followed by" Linked Group Details   100 mg 200 mL/hr over 30 Minutes Intravenous Daily 09/11/20 1513 09/11/20 1516   09/11/20 1600  remdesivir 200 mg in sodium chloride 0.9% 250 mL IVPB       "Followed by" Linked Group Details   200 mg 580 mL/hr over 30 Minutes Intravenous Once 09/11/20 1508 09/11/20 2200   09/11/20 1600  remdesivir 200 mg in sodium chloride 0.9% 250 mL IVPB  Status:  Discontinued       "Followed by" Linked Group Details   200 mg 580 mL/hr over 30 Minutes Intravenous Once 09/11/20 1513 09/11/20 1516       DVT prophylaxis: Lovenox  Code Status: Full code  Family Communication: No family at bedside   Consultants:    Procedures:      Objective   Vitals:   09/14/20 1941 09/15/20 0558 09/15/20 0817 09/15/20 1117  BP: (!) 173/84 (!) 154/85 (!) 149/87 139/71  Pulse: (!) 57 (!) 59 (!) 58 65   Resp: 18 18 18 16   Temp: 98 F (36.7 C) 97.9 F (36.6 C) 98 F (36.7 C) 98.3 F (36.8 C)  TempSrc: Oral Oral    SpO2: 93% 93% 92% 94%  Weight:      Height:       No intake or output data in the 24 hours ending 09/15/20 1120  No intake/output data recorded.  Filed Weights   09/11/20 1323 09/11/20 1658  Weight: 105.7 kg  105.7 kg    Physical Examination:   General-appears in no acute distress Heart-S1-S2, regular, no murmur auscultated Lungs-clear to auscultation bilaterally, no wheezing or crackles auscultated Abdomen-soft, nontender, no organomegaly Extremities-no edema in the lower extremities Neuro-alert, oriented x3, no focal deficit noted  Status is: Inpatient  Dispo: The patient is from: Home              Anticipated d/c is to: Home              Anticipated d/c date is: 09/16/2020              Patient currently not medically stable for discharge  Barrier to discharge-treatment for COVID-19 infection, diarrhea      Data Reviewed:   No results found for this or any previous visit (from the past 240 hour(s)).  No results for input(s): LIPASE, AMYLASE in the last 168 hours. No results for input(s): AMMONIA in the last 168 hours.  Cardiac Enzymes: Recent Labs  Lab 09/11/20 1520  CKTOTAL 155    Studies:  DG Chest Port 1V same Day  Result Date: 09/13/2020 CLINICAL DATA:  COVID-19 pneumonia. EXAM: PORTABLE CHEST 1 VIEW COMPARISON:  September 11, 2020. FINDINGS: Stable cardiomediastinal silhouette. No pneumothorax or pleural effusion is noted. Stable right basilar atelectasis or infiltrate is noted. Interval development of mild left basilar subsegmental atelectasis. Bony thorax is unremarkable. IMPRESSION: Stable right basilar atelectasis or infiltrate. Interval development of mild left basilar subsegmental atelectasis. Electronically Signed   By: Marijo Conception M.D.   On: 09/13/2020 14:59   CT RENAL STONE STUDY  Result Date: 09/14/2020 CLINICAL DATA:  Hematuria  EXAM: CT ABDOMEN AND PELVIS WITHOUT CONTRAST TECHNIQUE: Multidetector CT imaging of the abdomen and pelvis was performed following the standard protocol without IV contrast. COMPARISON:  April 20, 2017, November 07, 2017, September 11, 2020 FINDINGS: Lower chest: Small RIGHT pleural effusion with pleural thickening. RIGHT lower lobe rounded atelectasis. Second area of consolidative opacity is unchanged since February 3rd. Three-vessel coronary artery atherosclerotic calcifications. Hepatobiliary: Cholelithiasis. Unremarkable noncontrast appearance of the liver. Pancreas: No new peripancreatic fat stranding. Spleen: Unremarkable Adrenals/Urinary Tract: Adrenal glands are unremarkable. No hydronephrosis. No nephrolithiasis. Exophytic cyst at the superior pole of the RIGHT kidney. There is some streaky hyperdensity within the kidneys likely reflecting retained contrast. Bladder is unremarkable. Stomach/Bowel: Stomach is unremarkable. No evidence of bowel obstruction. Scattered diverticulosis. Vascular/Lymphatic: Revisualization of ill-defined soft tissue encasing the SMA and upper mesentery. This is unchanged in comparison to remote prior CTs. Atherosclerotic calcifications of the aorta, severe. Reproductive: Prostatomegaly. Other: No free air or free fluid. Sequela of injection in the anterior abdominal soft tissues. Musculoskeletal: Degenerative changes of bilateral hips in the thoracolumbar spine. IMPRESSION: 1. No nephrolithiasis or hydronephrosis.  There is prostatomegaly. 2. Delayed excretion of contrast in the kidneys could reflect underlying renal dysfunction. 3. Revisualization of ill-defined soft tissue encasing the SMA and upper mesentery. This is unchanged in comparison to remote prior CTs. Aortic Atherosclerosis (ICD10-I70.0). Electronically Signed   By: Valentino Saxon MD   On: 09/14/2020 11:45       Oswald Hillock   [facility] If 7PM-7AM, please contact night-coverage at  [email or web address], Office  [phone]   09/15/2020, 11:20 AM  LOS: 4 days

## 2020-09-16 DIAGNOSIS — R531 Weakness: Secondary | ICD-10-CM | POA: Diagnosis not present

## 2020-09-16 DIAGNOSIS — E860 Dehydration: Secondary | ICD-10-CM | POA: Diagnosis not present

## 2020-09-16 DIAGNOSIS — I25118 Atherosclerotic heart disease of native coronary artery with other forms of angina pectoris: Secondary | ICD-10-CM | POA: Diagnosis not present

## 2020-09-16 DIAGNOSIS — U071 COVID-19: Secondary | ICD-10-CM | POA: Diagnosis not present

## 2020-09-16 MED ORDER — PREDNISONE 10 MG PO TABS: ORAL_TABLET | ORAL | 0 refills | Status: DC

## 2020-09-16 MED ORDER — PRAVASTATIN SODIUM 40 MG PO TABS: 40.0000 mg | ORAL_TABLET | Freq: Every day | ORAL | 2 refills | Status: DC

## 2020-09-16 NOTE — Progress Notes (Signed)
Pt being discharged home, discharge instructions reviewed with pt using teach back method, pt states understanding, pt with no complaints, waiting for wife for transport

## 2020-09-16 NOTE — Discharge Summary (Signed)
Physician Discharge Summary  John Gallegos YKD:[id number] DOB: 1945/11/05 DOA: 09/11/2020  PCP: Tracie Harrier, MD  Admit date: 09/11/2020 Discharge date: 09/16/2020  Time spent: 50* minutes  Recommendations for Outpatient Follow-up:  1. Follow-up PCP in 2 weeks  Discharge Diagnoses:  Principal Problem:   Gastroenteritis due to COVID-19 virus Active Problems:   Hypertension   CAD (coronary artery disease), native coronary artery   Generalized weakness   Hiatal hernia   Hypokalemia   Near syncope   Dehydration   Discharge Condition: Stable  Diet recommendation: Heart healthy diet  Filed Weights   09/11/20 1323 09/11/20 1658  Weight: 105.7 kg 105.7 kg    History of present illness:  75 yr old male  With medical history of Non hodgkin lymphoma currently in remission, hiatal hernia, hypertension, gout who was diagnosed with Covid 19 viral infection on 09/04/2020 at [place].  Patient felt extremely weak after he was diagnosed with COVID-19, he also had diarrhea, anorexia, cough productive of clear phlegm and nausea.  Denies vomiting.  Patient had worsening symptoms he was asked by his PCP to go to ED for further evaluation.  Vaccination status-unvaccinated  In the ED chest x-ray showed possible lobar atelectasis of right lower lobe, mild right basilar opacity noted laterally concerning for subsegmental atelectasis or possibly infiltrate.  CT chest did not show evidence of PE.  CT scan of the head showed no acute intracranial abnormality.   Hospital Course:   1. Gastroenteritis-secondary to COVID-19 infection.  Patient is not vaccinated against COVID-19.  Patient does not have respiratory symptoms but predominantly GI symptoms.  At this time both vomiting and diarrhea has resolved.  Patient is currently tolerating diet well.     2. Hematuria-UA obtained yesterday showed greater than 50 RBCs per high-power field.  Called and discussed with urologist on-call Dr Bernardo Heater,  he recommends that patient can be seen by urology as outpatient for cystoscopy.  No intervention needed at this time.  CT stone protocol obtained shows no nephrolithiasis.  No hydronephrosis noted.  Patient will follow up with urology in 2 weeks. 3. COVID-19 infection-patient has developed cough, O2 sats 88 to 90% on room air. CRP was 9.9 yesterday. He was started on remdesivir per pharmacy.  Patient started on  Solu-Medrol 0.5 mg IV every 24 hours.  Chest x-ray shows infiltrate versus atelectasis.    Currently is not requiring oxygen, O2 sats 94% on room air.  CRP is down to 2.9.  Patient has received 5 days of steroids in the hospital.  Will discharge on 5 days prednisone taper. 4. Acute kidney injury-resolved, patient baseline creatinine is around 0.9-1.  He presents with creatinine 1.29 which went up to 1.77 likely from dehydration/diarrhe versus contrast-induced nephropathy. He was started on IV normal saline at 75 mill per hour. Creatinine down to 1.05 today.  IV fluids were discontinued yesterday.  Patient eating well.    5. Hypokalemia-replete 6. Generalized weakness-near-syncope.  Likely from dehydration from GI losses, losartan, HCTZ will be discontinued.  Patient to go home with home health PT 5 inch rolling walker will also be provided. 7. Hypertension-continue home medication including Coreg, hydralazine as needed, doxazosin. 8. Hypokalemia-replete 9. History of CAD-continue aspirin, statin   Procedures:    Consultations:    Discharge Exam: Vitals:   09/16/20 0726 09/16/20 0735  BP: 121/73   Pulse: (!) 56 64  Resp: 16   Temp: 98.4 F (36.9 C)   SpO2: 90%     General: Appears  in no acute distress Cardiovascular: S1-S2, regular, no murmur auscultated Respiratory: Clear to auscultation bilaterally  Discharge Instructions   Discharge Instructions    Diet - low sodium heart healthy   Complete by: As directed    Increase activity slowly   Complete by: As directed       Allergies as of 09/16/2020      Reactions   Amlodipine    Severe leg swelling      Medication List    STOP taking these medications   losartan-hydrochlorothiazide 100-25 MG tablet Commonly known as: HYZAAR     TAKE these medications   aspirin EC 81 MG tablet Take 81 mg by mouth daily.   carvedilol 12.5 MG tablet Commonly known as: COREG Take 0.5 tablets (6.25 mg total) by mouth 2 (two) times daily.   doxazosin 2 MG tablet Commonly known as: Cardura Take 1 tablet (2 mg total) by mouth 2 (two) times daily.   gabapentin 300 MG capsule Commonly known as: NEURONTIN Take 1 capsule (300 mg total) by mouth at bedtime.   hydrALAZINE 100 MG tablet Commonly known as: APRESOLINE Take 1 tablet (100 mg total) by mouth 3 (three) times daily as needed.   pravastatin 40 MG tablet Commonly known as: PRAVACHOL Take 1 tablet (40 mg total) by mouth at bedtime.   predniSONE 10 MG tablet Commonly known as: DELTASONE Prednisone 40 mg po daily x 1 day then Prednisone 30 mg po daily x 1 day then Prednisone 20 mg po daily x 1 day then Prednisone 10 mg daily x 1 day then stop...   zolpidem 10 MG tablet Commonly known as: AMBIEN Take 10 mg by mouth at bedtime.            Durable Medical Equipment  (From admission, onward)         Start     Ordered   09/16/20 1143  For home use only DME Walker rolling  Once       Question Answer Comment  Walker: With [place] Wheels   Patient needs a walker to treat with the following condition COVID-19      09/16/20 1142   09/15/20 1438  For home use only DME Walker rolling  Once       Question Answer Comment  Walker: With 5 Inch Wheels   Patient needs a walker to treat with the following condition COVID-19   Patient needs a walker to treat with the following condition Weakness generalized      09/15/20 1437         Allergies  Allergen Reactions  . Amlodipine     Severe leg swelling      The results of significant diagnostics from  this hospitalization (including imaging, microbiology, ancillary and laboratory) are listed below for reference.    Significant Diagnostic Studies: CT Head Wo Contrast  Result Date: 09/11/2020 CLINICAL DATA:  Mental status changes in a patient with reported COVID-19 infection at age 75. EXAM: CT HEAD WITHOUT CONTRAST TECHNIQUE: Contiguous axial images were obtained from the base of the skull through the vertex without intravenous contrast. COMPARISON:  February 2015 and MRI from 2019. FINDINGS: Brain: No evidence of acute infarction, hemorrhage, hydrocephalus, extra-axial collection or mass lesion/mass effect. Signs of atrophy and chronic microvascular ischemic change similar to prior MRI. Vascular: No hyperdense vessel or unexpected calcification. Skull: Normal. Negative for fracture or focal lesion. Sinuses/Orbits: Extensive LEFT ethmoid and maxillary sinus disease increased since the previous MRI where there was significant  sinus disease, likely with acute component and with resolution of sinus disease within the visualized RIGHT maxillary sinus when compared to more remote imaging. Other: None. IMPRESSION: 1. No acute intracranial abnormality. 2. Signs of atrophy and chronic microvascular ischemic change similar to prior MRI. 3. Sinus disease increased on the LEFT and potentially with acute component and improved on the RIGHT when compared to 2019 evaluation. Electronically Signed   By: Zetta Bills M.D.   On: 09/11/2020 14:47   CT Angio Chest PE W and/or Wo Contrast  Result Date: 09/11/2020 CLINICAL DATA:  Near syncope, cough, elevated troponin, COVID positive EXAM: CT ANGIOGRAPHY CHEST WITH CONTRAST TECHNIQUE: Multidetector CT imaging of the chest was performed using the standard protocol during bolus administration of intravenous contrast. Multiplanar CT image reconstructions and MIPs were obtained to evaluate the vascular anatomy. CONTRAST:  9mL OMNIPAQUE IOHEXOL 350 MG/ML SOLN COMPARISON:  Same  day chest radiograph, and PET-CT January 09, 2018. FINDINGS: Cardiovascular: Satisfactory opacification of the pulmonary arteries to the segmental level. No evidence of pulmonary embolism. Coronary artery calcifications. Aortic atherosclerosis. Left-sided cardiac enlargement. No pericardial effusion. Mediastinum/Nodes: No discrete thyroid nodules. No mediastinal, hilar or axillary lymphadenopathy. Trachea and esophagus are unremarkable. Lungs/Pleura: Increased size of the small right pleural effusion with new tiny left pleural effusion. Similar right lower lobe round atelectasis adjacent to this there is a new scattered bilateral subsegmental atelectasis. Juxtapleural round opacity in the lateral right lower lobe. Upper Abdomen: Similar stranding along the celiac and SMA arteries. Cholelithiasis without findings of cholecystitis. Similar nodular thickening of the left adrenal gland follow-up. Musculoskeletal: No chest wall abnormality. No acute or significant osseous findings. Multilevel degenerative changes spine. Review of the MIP images confirms the above findings. IMPRESSION: 1. No evidence of pulmonary embolism. 2. Increased size of small right pleural effusion with new tiny left pleural effusion. 3. Similar right lower lobe round atelectasis adjacent to this there is a new scattered bilateral subsegmental atelectasis. Additional new juxtapleural round opacity in the lateral right lower lobe is nonspecific favored to represent represent another focus of round atelectasis atelectasis or most likely pneumonia. Recommend short interval follow-up CT in 3 months assess stability. 4. Stranding along the celiac and SMA arteries, similar prior PET-CT. 5. Cholelithiasis without findings of cholecystitis. 6. Aortic atherosclerosis. Aortic Atherosclerosis (ICD10-I70.0). Electronically Signed   By: Dahlia Bailiff MD   On: 09/11/2020 15:55   DG Chest Portable 1 View  Result Date: 09/11/2020 CLINICAL DATA:  Shortness of  breath. EXAM: PORTABLE CHEST 1 VIEW COMPARISON:  September 18, 2013. FINDINGS: Stable cardiomediastinal silhouette. No pneumothorax is noted. Left lung is clear. Possible lobar atelectasis of right lower lobe is noted. Mild right basilar opacity is noted laterally concerning for subsegmental atelectasis or possibly infiltrate. Bony thorax is unremarkable. IMPRESSION: Possible lobar atelectasis of right lower lobe. Mild right basilar opacity is noted laterally concerning for subsegmental atelectasis or possibly infiltrate. Follow-up radiographs are recommended. Electronically Signed   By: Marijo Conception M.D.   On: 09/11/2020 14:14   DG Chest Port 1V same Day  Result Date: 09/13/2020 CLINICAL DATA:  COVID-19 pneumonia. EXAM: PORTABLE CHEST 1 VIEW COMPARISON:  September 11, 2020. FINDINGS: Stable cardiomediastinal silhouette. No pneumothorax or pleural effusion is noted. Stable right basilar atelectasis or infiltrate is noted. Interval development of mild left basilar subsegmental atelectasis. Bony thorax is unremarkable. IMPRESSION: Stable right basilar atelectasis or infiltrate. Interval development of mild left basilar subsegmental atelectasis. Electronically Signed   By: Bobbe Medico.D.  On: 09/13/2020 14:59   CT RENAL STONE STUDY  Result Date: 09/14/2020 CLINICAL DATA:  Hematuria EXAM: CT ABDOMEN AND PELVIS WITHOUT CONTRAST TECHNIQUE: Multidetector CT imaging of the abdomen and pelvis was performed following the standard protocol without IV contrast. COMPARISON:  April 20, 2017, November 07, 2017, September 11, 2020 FINDINGS: Lower chest: Small RIGHT pleural effusion with pleural thickening. RIGHT lower lobe rounded atelectasis. Second area of consolidative opacity is unchanged since February 3rd. Three-vessel coronary artery atherosclerotic calcifications. Hepatobiliary: Cholelithiasis. Unremarkable noncontrast appearance of the liver. Pancreas: No new peripancreatic fat stranding. Spleen: Unremarkable  Adrenals/Urinary Tract: Adrenal glands are unremarkable. No hydronephrosis. No nephrolithiasis. Exophytic cyst at the superior pole of the RIGHT kidney. There is some streaky hyperdensity within the kidneys likely reflecting retained contrast. Bladder is unremarkable. Stomach/Bowel: Stomach is unremarkable. No evidence of bowel obstruction. Scattered diverticulosis. Vascular/Lymphatic: Revisualization of ill-defined soft tissue encasing the SMA and upper mesentery. This is unchanged in comparison to remote prior CTs. Atherosclerotic calcifications of the aorta, severe. Reproductive: Prostatomegaly. Other: No free air or free fluid. Sequela of injection in the anterior abdominal soft tissues. Musculoskeletal: Degenerative changes of bilateral hips in the thoracolumbar spine. IMPRESSION: 1. No nephrolithiasis or hydronephrosis.  There is prostatomegaly. 2. Delayed excretion of contrast in the kidneys could reflect underlying renal dysfunction. 3. Revisualization of ill-defined soft tissue encasing the SMA and upper mesentery. This is unchanged in comparison to remote prior CTs. Aortic Atherosclerosis (ICD10-I70.0). Electronically Signed   By: Valentino Saxon MD   On: 09/14/2020 11:45    Microbiology: No results found for this or any previous visit (from the past 240 hour(s)).   Labs: Basic Metabolic Panel: Recent Labs  Lab 09/11/20 1330 09/11/20 1520 09/12/20 0433 09/13/20 0621 09/14/20 0309 09/15/20 0555  NA 135  --  136 141 137 141  K 2.9*  --  3.6 3.2* 3.8 4.3  CL 94*  --  99 102 102 101  CO2 29  --  27 28 26 30   GLUCOSE 143*  --  171* 107* 156* 157*  BUN 17  --  28* 48* 43* 40*  CREATININE 1.29*  --  1.77* 1.63* 1.14 1.05  CALCIUM 8.6*  --  8.5* 8.5* 8.5* 8.9  MG  --  1.9 1.8 2.1 2.0 2.1  PHOS  --   --  3.9 3.6 3.7 3.0   Liver Function Tests: Recent Labs  Lab 09/11/20 1330 09/12/20 0433 09/13/20 0621 09/14/20 0309 09/15/20 0555  AST 28 23 22 22 27   ALT 17 17 15 19 27    ALKPHOS 62 58 58 62 65  BILITOT 0.9 0.6 0.5 0.7 0.6  PROT 7.2 6.6 6.3* 6.6 6.4*  ALBUMIN 3.3* 3.0* 2.8* 2.9* 3.0*   No results for input(s): LIPASE, AMYLASE in the last 168 hours. No results for input(s): AMMONIA in the last 168 hours. CBC: Recent Labs  Lab 09/11/20 1330 09/12/20 0433 09/13/20 0621 09/14/20 0309 09/15/20 0555  WBC 7.3 4.2 7.9 7.9 12.8*  NEUTROABS 6.3 3.5 6.4 7.1 11.6*  HGB 13.7 12.5* 12.2* 12.0* 13.1  HCT 39.4 37.0* 35.3* 35.8* 38.9*  MCV 86.6 87.3 87.4 88.0 87.4  PLT 234 222 243 292 334   Cardiac Enzymes: Recent Labs  Lab 09/11/20 1520  CKTOTAL 155       Signed:  Oswald Hillock MD.  [facility] 09/16/2020, 11:50 AM

## 2020-09-16 NOTE — TOC Transition Note (Signed)
Transition of Care [facility]) - CM/SW Discharge Note   Patient Details  Name: John Gallegos MRN: [id number] Date of Birth: 1946-05-25  Transition of Care [facility]) CM/SW Contact:  Shelbie Hutching, RN Phone Number: 09/16/2020, 12:36 PM   Clinical Narrative:    Patient is medically cleared for discharge home with home health services.  [place] with [facility] notified of discharge today.  Patient will be picked up by his wife.  Walker delivered to room yesterday.    Final next level of care: [place] Barriers to Discharge: Barriers Resolved   Patient Goals and CMS Choice Patient states their goals for this hospitalization and ongoing recovery are:: Wants to have access to his medical records- discussed my chart- wants to go home and know what to do once he gets there CMS Medicare.gov Compare Post Acute Care list provided to:: Patient Choice offered to / list presented to : Patient  Discharge Placement                       Discharge Plan and Services   Discharge Planning Services: CM Consult Post Acute Care Choice: Home Health          DME Arranged: Gilford Rile DME Agency: [employer] Date DME Agency Contacted: 09/15/20 Time DME Agency Contacted: [phone] Representative spoke with at DME Agency: [place]: RN,PT [place] Agency: [facility] Date [place]: 09/16/20 Time [place]: [id number] Representative spoke with at [place]: [place] ([place]) Interventions     Readmission Risk Interventions No flowsheet data found.

## 2020-09-19 ENCOUNTER — Other Ambulatory Visit: Payer: Self-pay

## 2020-09-19 NOTE — Patient Outreach (Addendum)
[facility]) Care Management  09/19/2020  John Gallegos 07/11/1946 [id number]    EMMI-General Discharge RED ON EMMI ALERT Day # 1 Date: 09/18/2020 Red Alert Reason: "Know who to call about changes in conditions? No"   Outreach attempt #1 to patient. Spoke with patient who denies any acute issues or concerns at present. He voices great appreciation for his care and service provided to him while in the hospital and becomes tearful as he recalls experience. Reviewed and addressed red alert. Patient misunderstood question. He confirm she has MD phone number and spoke with PCP office on yesterday. He has MD follow up appt on Tues.He plans to discuss rather or not he should resume his BP med as it was stopped due to him being dehydrated. He voices that he has been eating fairly well and drinking plenty of water since returning home. Patient states that [facility] called and someone coming out tomorrow but he can not remember the time. RN Cm provided patient with [facility] agency contact number so he can call to follow up on time of visit. Advised patient that they would get one more automated EMMI-GENERAL post discharge calls to assess how they are doing following recent hospitalization and will receive a call from a nurse if any of their responses were abnormal. Patient voiced understanding and was appreciative of f/u call.  Plan: RN CM will close case at this time.   Enzo Montgomery, RN,BSN,CCM [place] [facility] Telephonic Care Management Coordinator Direct Phone: [phone] Toll Free: [phone] Fax: [phone]

## 2020-09-20 DIAGNOSIS — M1990 Unspecified osteoarthritis, unspecified site: Secondary | ICD-10-CM | POA: Diagnosis not present

## 2020-09-20 DIAGNOSIS — U071 COVID-19: Secondary | ICD-10-CM | POA: Diagnosis not present

## 2020-09-20 DIAGNOSIS — E785 Hyperlipidemia, unspecified: Secondary | ICD-10-CM | POA: Diagnosis not present

## 2020-09-20 DIAGNOSIS — I890 Lymphedema, not elsewhere classified: Secondary | ICD-10-CM | POA: Diagnosis not present

## 2020-09-20 DIAGNOSIS — K529 Noninfective gastroenteritis and colitis, unspecified: Secondary | ICD-10-CM | POA: Diagnosis not present

## 2020-09-20 DIAGNOSIS — C8590 Non-Hodgkin lymphoma, unspecified, unspecified site: Secondary | ICD-10-CM | POA: Diagnosis not present

## 2020-09-20 DIAGNOSIS — I2510 Atherosclerotic heart disease of native coronary artery without angina pectoris: Secondary | ICD-10-CM | POA: Diagnosis not present

## 2020-09-20 DIAGNOSIS — I10 Essential (primary) hypertension: Secondary | ICD-10-CM | POA: Diagnosis not present

## 2020-09-20 DIAGNOSIS — G4700 Insomnia, unspecified: Secondary | ICD-10-CM | POA: Diagnosis not present

## 2020-09-21 DIAGNOSIS — I890 Lymphedema, not elsewhere classified: Secondary | ICD-10-CM | POA: Diagnosis not present

## 2020-09-21 DIAGNOSIS — I2510 Atherosclerotic heart disease of native coronary artery without angina pectoris: Secondary | ICD-10-CM | POA: Diagnosis not present

## 2020-09-21 DIAGNOSIS — K529 Noninfective gastroenteritis and colitis, unspecified: Secondary | ICD-10-CM | POA: Diagnosis not present

## 2020-09-21 DIAGNOSIS — U071 COVID-19: Secondary | ICD-10-CM | POA: Diagnosis not present

## 2020-09-21 DIAGNOSIS — I10 Essential (primary) hypertension: Secondary | ICD-10-CM | POA: Diagnosis not present

## 2020-09-21 DIAGNOSIS — C8590 Non-Hodgkin lymphoma, unspecified, unspecified site: Secondary | ICD-10-CM | POA: Diagnosis not present

## 2020-09-21 DIAGNOSIS — G4700 Insomnia, unspecified: Secondary | ICD-10-CM | POA: Diagnosis not present

## 2020-09-21 DIAGNOSIS — M1990 Unspecified osteoarthritis, unspecified site: Secondary | ICD-10-CM | POA: Diagnosis not present

## 2020-09-21 DIAGNOSIS — E785 Hyperlipidemia, unspecified: Secondary | ICD-10-CM | POA: Diagnosis not present

## 2020-09-23 DIAGNOSIS — M1990 Unspecified osteoarthritis, unspecified site: Secondary | ICD-10-CM | POA: Diagnosis not present

## 2020-09-23 DIAGNOSIS — E876 Hypokalemia: Secondary | ICD-10-CM | POA: Diagnosis not present

## 2020-09-23 DIAGNOSIS — H9190 Unspecified hearing loss, unspecified ear: Secondary | ICD-10-CM | POA: Diagnosis not present

## 2020-09-23 DIAGNOSIS — Z79899 Other long term (current) drug therapy: Secondary | ICD-10-CM | POA: Diagnosis not present

## 2020-09-23 DIAGNOSIS — F5104 Psychophysiologic insomnia: Secondary | ICD-10-CM | POA: Diagnosis not present

## 2020-09-23 DIAGNOSIS — C8339 Diffuse large B-cell lymphoma, extranodal and solid organ sites: Secondary | ICD-10-CM | POA: Diagnosis not present

## 2020-09-23 DIAGNOSIS — I10 Essential (primary) hypertension: Secondary | ICD-10-CM | POA: Diagnosis not present

## 2020-09-23 DIAGNOSIS — Z09 Encounter for follow-up examination after completed treatment for conditions other than malignant neoplasm: Secondary | ICD-10-CM | POA: Diagnosis not present

## 2020-09-23 DIAGNOSIS — Z8616 Personal history of COVID-19: Secondary | ICD-10-CM | POA: Diagnosis not present

## 2020-09-26 DIAGNOSIS — M1990 Unspecified osteoarthritis, unspecified site: Secondary | ICD-10-CM | POA: Diagnosis not present

## 2020-09-26 DIAGNOSIS — K529 Noninfective gastroenteritis and colitis, unspecified: Secondary | ICD-10-CM | POA: Diagnosis not present

## 2020-09-26 DIAGNOSIS — U071 COVID-19: Secondary | ICD-10-CM | POA: Diagnosis not present

## 2020-09-26 DIAGNOSIS — C8590 Non-Hodgkin lymphoma, unspecified, unspecified site: Secondary | ICD-10-CM | POA: Diagnosis not present

## 2020-09-26 DIAGNOSIS — I2510 Atherosclerotic heart disease of native coronary artery without angina pectoris: Secondary | ICD-10-CM | POA: Diagnosis not present

## 2020-09-26 DIAGNOSIS — I10 Essential (primary) hypertension: Secondary | ICD-10-CM | POA: Diagnosis not present

## 2020-09-26 DIAGNOSIS — I890 Lymphedema, not elsewhere classified: Secondary | ICD-10-CM | POA: Diagnosis not present

## 2020-09-26 DIAGNOSIS — E785 Hyperlipidemia, unspecified: Secondary | ICD-10-CM | POA: Diagnosis not present

## 2020-09-26 DIAGNOSIS — G4700 Insomnia, unspecified: Secondary | ICD-10-CM | POA: Diagnosis not present

## 2020-09-29 DIAGNOSIS — G4700 Insomnia, unspecified: Secondary | ICD-10-CM | POA: Diagnosis not present

## 2020-09-29 DIAGNOSIS — M1990 Unspecified osteoarthritis, unspecified site: Secondary | ICD-10-CM | POA: Diagnosis not present

## 2020-09-29 DIAGNOSIS — K529 Noninfective gastroenteritis and colitis, unspecified: Secondary | ICD-10-CM | POA: Diagnosis not present

## 2020-09-29 DIAGNOSIS — C8590 Non-Hodgkin lymphoma, unspecified, unspecified site: Secondary | ICD-10-CM | POA: Diagnosis not present

## 2020-09-29 DIAGNOSIS — I10 Essential (primary) hypertension: Secondary | ICD-10-CM | POA: Diagnosis not present

## 2020-09-29 DIAGNOSIS — U071 COVID-19: Secondary | ICD-10-CM | POA: Diagnosis not present

## 2020-09-29 DIAGNOSIS — I890 Lymphedema, not elsewhere classified: Secondary | ICD-10-CM | POA: Diagnosis not present

## 2020-09-29 DIAGNOSIS — I2510 Atherosclerotic heart disease of native coronary artery without angina pectoris: Secondary | ICD-10-CM | POA: Diagnosis not present

## 2020-09-29 DIAGNOSIS — E785 Hyperlipidemia, unspecified: Secondary | ICD-10-CM | POA: Diagnosis not present

## 2020-10-01 DIAGNOSIS — I890 Lymphedema, not elsewhere classified: Secondary | ICD-10-CM | POA: Diagnosis not present

## 2020-10-01 DIAGNOSIS — E785 Hyperlipidemia, unspecified: Secondary | ICD-10-CM | POA: Diagnosis not present

## 2020-10-01 DIAGNOSIS — U071 COVID-19: Secondary | ICD-10-CM | POA: Diagnosis not present

## 2020-10-01 DIAGNOSIS — I10 Essential (primary) hypertension: Secondary | ICD-10-CM | POA: Diagnosis not present

## 2020-10-01 DIAGNOSIS — G4700 Insomnia, unspecified: Secondary | ICD-10-CM | POA: Diagnosis not present

## 2020-10-01 DIAGNOSIS — K529 Noninfective gastroenteritis and colitis, unspecified: Secondary | ICD-10-CM | POA: Diagnosis not present

## 2020-10-01 DIAGNOSIS — C8590 Non-Hodgkin lymphoma, unspecified, unspecified site: Secondary | ICD-10-CM | POA: Diagnosis not present

## 2020-10-01 DIAGNOSIS — I2510 Atherosclerotic heart disease of native coronary artery without angina pectoris: Secondary | ICD-10-CM | POA: Diagnosis not present

## 2020-10-01 DIAGNOSIS — M1990 Unspecified osteoarthritis, unspecified site: Secondary | ICD-10-CM | POA: Diagnosis not present

## 2020-10-03 DIAGNOSIS — K529 Noninfective gastroenteritis and colitis, unspecified: Secondary | ICD-10-CM | POA: Diagnosis not present

## 2020-10-03 DIAGNOSIS — E785 Hyperlipidemia, unspecified: Secondary | ICD-10-CM | POA: Diagnosis not present

## 2020-10-03 DIAGNOSIS — I890 Lymphedema, not elsewhere classified: Secondary | ICD-10-CM | POA: Diagnosis not present

## 2020-10-03 DIAGNOSIS — G4700 Insomnia, unspecified: Secondary | ICD-10-CM | POA: Diagnosis not present

## 2020-10-03 DIAGNOSIS — U071 COVID-19: Secondary | ICD-10-CM | POA: Diagnosis not present

## 2020-10-03 DIAGNOSIS — C8590 Non-Hodgkin lymphoma, unspecified, unspecified site: Secondary | ICD-10-CM | POA: Diagnosis not present

## 2020-10-03 DIAGNOSIS — I10 Essential (primary) hypertension: Secondary | ICD-10-CM | POA: Diagnosis not present

## 2020-10-03 DIAGNOSIS — I2510 Atherosclerotic heart disease of native coronary artery without angina pectoris: Secondary | ICD-10-CM | POA: Diagnosis not present

## 2020-10-03 DIAGNOSIS — M1990 Unspecified osteoarthritis, unspecified site: Secondary | ICD-10-CM | POA: Diagnosis not present

## 2020-10-06 DIAGNOSIS — C8590 Non-Hodgkin lymphoma, unspecified, unspecified site: Secondary | ICD-10-CM | POA: Diagnosis not present

## 2020-10-06 DIAGNOSIS — U071 COVID-19: Secondary | ICD-10-CM | POA: Diagnosis not present

## 2020-10-06 DIAGNOSIS — M1990 Unspecified osteoarthritis, unspecified site: Secondary | ICD-10-CM | POA: Diagnosis not present

## 2020-10-06 DIAGNOSIS — E785 Hyperlipidemia, unspecified: Secondary | ICD-10-CM | POA: Diagnosis not present

## 2020-10-06 DIAGNOSIS — I890 Lymphedema, not elsewhere classified: Secondary | ICD-10-CM | POA: Diagnosis not present

## 2020-10-06 DIAGNOSIS — I10 Essential (primary) hypertension: Secondary | ICD-10-CM | POA: Diagnosis not present

## 2020-10-06 DIAGNOSIS — G4700 Insomnia, unspecified: Secondary | ICD-10-CM | POA: Diagnosis not present

## 2020-10-06 DIAGNOSIS — I2510 Atherosclerotic heart disease of native coronary artery without angina pectoris: Secondary | ICD-10-CM | POA: Diagnosis not present

## 2020-10-06 DIAGNOSIS — K529 Noninfective gastroenteritis and colitis, unspecified: Secondary | ICD-10-CM | POA: Diagnosis not present

## 2020-10-08 DIAGNOSIS — G4700 Insomnia, unspecified: Secondary | ICD-10-CM | POA: Diagnosis not present

## 2020-10-08 DIAGNOSIS — C8590 Non-Hodgkin lymphoma, unspecified, unspecified site: Secondary | ICD-10-CM | POA: Diagnosis not present

## 2020-10-08 DIAGNOSIS — M1990 Unspecified osteoarthritis, unspecified site: Secondary | ICD-10-CM | POA: Diagnosis not present

## 2020-10-08 DIAGNOSIS — U071 COVID-19: Secondary | ICD-10-CM | POA: Diagnosis not present

## 2020-10-08 DIAGNOSIS — E785 Hyperlipidemia, unspecified: Secondary | ICD-10-CM | POA: Diagnosis not present

## 2020-10-08 DIAGNOSIS — I10 Essential (primary) hypertension: Secondary | ICD-10-CM | POA: Diagnosis not present

## 2020-10-08 DIAGNOSIS — K529 Noninfective gastroenteritis and colitis, unspecified: Secondary | ICD-10-CM | POA: Diagnosis not present

## 2020-10-08 DIAGNOSIS — I890 Lymphedema, not elsewhere classified: Secondary | ICD-10-CM | POA: Diagnosis not present

## 2020-10-08 DIAGNOSIS — I2510 Atherosclerotic heart disease of native coronary artery without angina pectoris: Secondary | ICD-10-CM | POA: Diagnosis not present

## 2020-10-10 DIAGNOSIS — I10 Essential (primary) hypertension: Secondary | ICD-10-CM | POA: Diagnosis not present

## 2020-10-10 DIAGNOSIS — M1990 Unspecified osteoarthritis, unspecified site: Secondary | ICD-10-CM | POA: Diagnosis not present

## 2020-10-10 DIAGNOSIS — G4700 Insomnia, unspecified: Secondary | ICD-10-CM | POA: Diagnosis not present

## 2020-10-10 DIAGNOSIS — I2510 Atherosclerotic heart disease of native coronary artery without angina pectoris: Secondary | ICD-10-CM | POA: Diagnosis not present

## 2020-10-10 DIAGNOSIS — E785 Hyperlipidemia, unspecified: Secondary | ICD-10-CM | POA: Diagnosis not present

## 2020-10-10 DIAGNOSIS — U071 COVID-19: Secondary | ICD-10-CM | POA: Diagnosis not present

## 2020-10-10 DIAGNOSIS — C8590 Non-Hodgkin lymphoma, unspecified, unspecified site: Secondary | ICD-10-CM | POA: Diagnosis not present

## 2020-10-10 DIAGNOSIS — K529 Noninfective gastroenteritis and colitis, unspecified: Secondary | ICD-10-CM | POA: Diagnosis not present

## 2020-10-10 DIAGNOSIS — I890 Lymphedema, not elsewhere classified: Secondary | ICD-10-CM | POA: Diagnosis not present

## 2020-10-17 DIAGNOSIS — C8590 Non-Hodgkin lymphoma, unspecified, unspecified site: Secondary | ICD-10-CM | POA: Diagnosis not present

## 2020-10-17 DIAGNOSIS — I890 Lymphedema, not elsewhere classified: Secondary | ICD-10-CM | POA: Diagnosis not present

## 2020-10-17 DIAGNOSIS — E785 Hyperlipidemia, unspecified: Secondary | ICD-10-CM | POA: Diagnosis not present

## 2020-10-17 DIAGNOSIS — K529 Noninfective gastroenteritis and colitis, unspecified: Secondary | ICD-10-CM | POA: Diagnosis not present

## 2020-10-17 DIAGNOSIS — G4700 Insomnia, unspecified: Secondary | ICD-10-CM | POA: Diagnosis not present

## 2020-10-17 DIAGNOSIS — I10 Essential (primary) hypertension: Secondary | ICD-10-CM | POA: Diagnosis not present

## 2020-10-17 DIAGNOSIS — U071 COVID-19: Secondary | ICD-10-CM | POA: Diagnosis not present

## 2020-10-17 DIAGNOSIS — I2510 Atherosclerotic heart disease of native coronary artery without angina pectoris: Secondary | ICD-10-CM | POA: Diagnosis not present

## 2020-10-17 DIAGNOSIS — M1990 Unspecified osteoarthritis, unspecified site: Secondary | ICD-10-CM | POA: Diagnosis not present

## 2020-10-20 DIAGNOSIS — M1990 Unspecified osteoarthritis, unspecified site: Secondary | ICD-10-CM | POA: Diagnosis not present

## 2020-10-20 DIAGNOSIS — K529 Noninfective gastroenteritis and colitis, unspecified: Secondary | ICD-10-CM | POA: Diagnosis not present

## 2020-10-20 DIAGNOSIS — I10 Essential (primary) hypertension: Secondary | ICD-10-CM | POA: Diagnosis not present

## 2020-10-20 DIAGNOSIS — G4700 Insomnia, unspecified: Secondary | ICD-10-CM | POA: Diagnosis not present

## 2020-10-20 DIAGNOSIS — E785 Hyperlipidemia, unspecified: Secondary | ICD-10-CM | POA: Diagnosis not present

## 2020-10-20 DIAGNOSIS — I2510 Atherosclerotic heart disease of native coronary artery without angina pectoris: Secondary | ICD-10-CM | POA: Diagnosis not present

## 2020-10-20 DIAGNOSIS — U071 COVID-19: Secondary | ICD-10-CM | POA: Diagnosis not present

## 2020-10-20 DIAGNOSIS — C8590 Non-Hodgkin lymphoma, unspecified, unspecified site: Secondary | ICD-10-CM | POA: Diagnosis not present

## 2020-10-20 DIAGNOSIS — I890 Lymphedema, not elsewhere classified: Secondary | ICD-10-CM | POA: Diagnosis not present

## 2020-10-27 DIAGNOSIS — I10 Essential (primary) hypertension: Secondary | ICD-10-CM | POA: Diagnosis not present

## 2020-10-27 DIAGNOSIS — C8590 Non-Hodgkin lymphoma, unspecified, unspecified site: Secondary | ICD-10-CM | POA: Diagnosis not present

## 2020-10-27 DIAGNOSIS — E785 Hyperlipidemia, unspecified: Secondary | ICD-10-CM | POA: Diagnosis not present

## 2020-10-27 DIAGNOSIS — K529 Noninfective gastroenteritis and colitis, unspecified: Secondary | ICD-10-CM | POA: Diagnosis not present

## 2020-10-27 DIAGNOSIS — U071 COVID-19: Secondary | ICD-10-CM | POA: Diagnosis not present

## 2020-10-27 DIAGNOSIS — I2510 Atherosclerotic heart disease of native coronary artery without angina pectoris: Secondary | ICD-10-CM | POA: Diagnosis not present

## 2020-10-27 DIAGNOSIS — I890 Lymphedema, not elsewhere classified: Secondary | ICD-10-CM | POA: Diagnosis not present

## 2020-10-27 DIAGNOSIS — M1990 Unspecified osteoarthritis, unspecified site: Secondary | ICD-10-CM | POA: Diagnosis not present

## 2020-10-27 DIAGNOSIS — G4700 Insomnia, unspecified: Secondary | ICD-10-CM | POA: Diagnosis not present

## 2020-11-15 ENCOUNTER — Other Ambulatory Visit: Payer: Self-pay | Admitting: Oncology

## 2020-12-01 ENCOUNTER — Ambulatory Visit (INDEPENDENT_AMBULATORY_CARE_PROVIDER_SITE_OTHER): Payer: Medicare HMO | Admitting: Vascular Surgery

## 2020-12-16 DIAGNOSIS — C8339 Diffuse large B-cell lymphoma, extranodal and solid organ sites: Secondary | ICD-10-CM | POA: Diagnosis not present

## 2020-12-16 DIAGNOSIS — R739 Hyperglycemia, unspecified: Secondary | ICD-10-CM | POA: Diagnosis not present

## 2020-12-16 DIAGNOSIS — H9190 Unspecified hearing loss, unspecified ear: Secondary | ICD-10-CM | POA: Diagnosis not present

## 2020-12-16 DIAGNOSIS — I700 Atherosclerosis of aorta: Secondary | ICD-10-CM | POA: Diagnosis not present

## 2020-12-16 DIAGNOSIS — I10 Essential (primary) hypertension: Secondary | ICD-10-CM | POA: Diagnosis not present

## 2020-12-16 DIAGNOSIS — F5104 Psychophysiologic insomnia: Secondary | ICD-10-CM | POA: Diagnosis not present

## 2020-12-16 DIAGNOSIS — I25118 Atherosclerotic heart disease of native coronary artery with other forms of angina pectoris: Secondary | ICD-10-CM | POA: Diagnosis not present

## 2020-12-23 DIAGNOSIS — G4700 Insomnia, unspecified: Secondary | ICD-10-CM | POA: Diagnosis not present

## 2020-12-23 DIAGNOSIS — E785 Hyperlipidemia, unspecified: Secondary | ICD-10-CM | POA: Diagnosis not present

## 2020-12-23 DIAGNOSIS — C8339 Diffuse large B-cell lymphoma, extranodal and solid organ sites: Secondary | ICD-10-CM | POA: Diagnosis not present

## 2020-12-23 DIAGNOSIS — H9190 Unspecified hearing loss, unspecified ear: Secondary | ICD-10-CM | POA: Diagnosis not present

## 2020-12-23 DIAGNOSIS — I10 Essential (primary) hypertension: Secondary | ICD-10-CM | POA: Diagnosis not present

## 2020-12-23 DIAGNOSIS — Z0000 Encounter for general adult medical examination without abnormal findings: Secondary | ICD-10-CM | POA: Diagnosis not present

## 2021-01-25 NOTE — Progress Notes (Signed)
Cardiology Office Note  Date:  01/26/2021   ID:  John Gallegos, DOB [birth year], MRN [id number]  PCP:  Tracie Harrier, MD   Chief Complaint  Patient presents with   6 month follow up     Patient c/o shortness of breath and occasional LE edema. Medications reviewed by the patient verbally.     HPI:  75 year old male with past medical history of Smoker, quit in 05/2017  HTN diffuse large B cell lymphoma (malaise summer 2018, blood in urine) CT scan 04/20/2017 which revealed moderate left hydronephrosis and delayed excretion down to a large 4.4 cm mass in or around the left proximal ureter. Significant native three-vessel coronary disease on CT scan Presents for follow-up of his chest pain  Gastroenteritis-secondary to COVID-19 infection. 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 Multifocal metastatic disease within the LEFT retroperitoneum, central mesenteries, and RIGHT paraspinal musculature.Two foci of skeletal metastasis  RCHOP chemotherapy, completed 4 rounds rounds, 2 more to go  PMH:   has a past medical history of Gout, Hard of hearing, Hiatal hernia, Hypertension, Non Hodgkin's lymphoma ([place]) (04/2017), and Urinary incontinence.  PSH:    Past Surgical History:  Procedure Laterality Date   CYSTOSCOPY W/ RETROGRADES Left 08/19/2017   Procedure: CYSTOSCOPY WITH RETROGRADE PYELOGRAM;  Surgeon: Abbie Sons, MD;   Location: [facility];  Service: Urology;  Laterality: Left;   CYSTOSCOPY W/ URETERAL STENT REMOVAL Left 08/19/2017   Procedure: CYSTOSCOPY WITH STENT REMOVAL;  Surgeon: Abbie Sons, MD;  Location: [facility];  Service: Urology;  Laterality: Left;   CYSTOSCOPY WITH BIOPSY Left 05/13/2017   Procedure: CYSTOSCOPY WITH URETERAL BIOPSY;  Surgeon: Nickie Retort, MD;  Location: [facility];  Service: Urology;  Laterality: Left;   CYSTOSCOPY WITH STENT PLACEMENT Left 05/13/2017   Procedure: CYSTOSCOPY WITH STENT PLACEMENT;  Surgeon: Nickie Retort, MD;  Location: [facility];  Service: Urology;  Laterality: Left;   PORTA CATH INSERTION N/A 06/14/2017   Procedure: PORTA CATH INSERTION;  Surgeon: Algernon Huxley, MD;  Location: [facility];  Service: Cardiovascular;  Laterality: N/A;   PORTA CATH REMOVAL N/A 02/25/2020   Procedure: PORTA CATH REMOVAL;  Surgeon: Algernon Huxley, MD;  Location: [place] [facility];  Service: Cardiovascular;  Laterality: N/A;   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     carvedilol (COREG) 12.5 MG tablet Take 12.5  mg by mouth 2 (two) times daily with a meal.     doxazosin (CARDURA) 2 MG tablet Take 1 tablet (2 mg total) by mouth 2 (two) times daily. 180 tablet 3   gabapentin (NEURONTIN) 300 MG capsule TAKE 1 CAPSULE(300 MG) BY MOUTH AT BEDTIME 90 capsule 1   hydrALAZINE (APRESOLINE) 100 MG tablet Take 100 mg by mouth 2 (two) times daily.      losartan-hydrochlorothiazide (HYZAAR) 100-25 MG tablet Take 1 tablet by mouth daily.     potassium chloride SA (KLOR-CON) 20 MEQ tablet Monday, Wednesday and Friday.     pravastatin (PRAVACHOL) 40 MG tablet Take 1 tablet (40 mg total) by mouth at bedtime. 30 tablet 2   zolpidem (AMBIEN) 10 MG tablet Take 10 mg by mouth at bedtime.     No current facility-administered medications for this visit.    Allergies:   Amlodipine   Social History:  The patient  reports that he quit smoking about 4 years ago. His smoking use included cigarettes. He has a 25.00 pack-year smoking history. He has never used smokeless tobacco. He reports current alcohol use of about 2.0 standard drinks of alcohol per week. He reports that he does not use drugs.   Family History:   family history includes Aneurysm in his mother; Heart attack in his mother; Lung cancer in his father; Lymphoma in his sister; Pancreatic cancer in his mother.    Review of Systems: Review of Systems  Constitutional: Negative.   HENT: Negative.    Respiratory: Negative.    Cardiovascular: Negative.   Gastrointestinal: Negative.   Musculoskeletal:  Positive for joint pain.       Nerve pain  Neurological: Negative.   Psychiatric/Behavioral: Negative.    All other systems reviewed and are negative.  PHYSICAL EXAM: VS:  BP (!) 150/90 (BP Location: Left Arm, Patient Position: Sitting, Cuff Size: Normal)   Pulse (!) 50   Ht 6\' 1"  (1.854 m)   Wt 229 lb 8 oz (104.1 kg)   SpO2 98%   BMI 30.28 kg/m  , BMI Body mass index is 30.28 kg/m. Constitutional:  oriented to person, place, and time. No distress.  HENT:  Head: Grossly normal Eyes:  no discharge. No scleral icterus.  Neck: No JVD, no carotid bruits  Cardiovascular: Regular rate and rhythm, no murmurs appreciated Pulmonary/Chest: Clear to auscultation bilaterally, no wheezes or rails Abdominal: Soft.  no distension.  no tenderness.  Musculoskeletal: Normal range of  motion Neurological:  normal muscle tone. Coordination normal. No atrophy Skin: Skin warm and dry Psychiatric: normal affect, pleasant   Recent Labs: 09/15/2020: ALT 27; BUN 40; Creatinine, Ser 1.05; Hemoglobin 13.1; Magnesium 2.1; Platelets 334; Potassium 4.3; Sodium 141    Lipid Panel No results found for: CHOL, HDL, LDLCALC, TRIG    Wt Readings from Last 3 Encounters:  01/26/21 229 lb 8 oz (104.1 kg)  09/11/20 233 lb (105.7 kg)  08/20/20 233 lb (105.7 kg)     ASSESSMENT AND PLAN:  Diffuse large B-cell lymphoma of extranodal site excluding spleen and other solid organs (HCC) Completed RCHOP Followed by oncology, no recent treatment  Mixed hyperlipidemia Cholesterol is at goal on the current lipid regimen. No changes to the medications were made.  Essential hypertension Blood pressure is well controlled on today's visit. No changes made to the medications.  Chest pain with moderate risk for cardiac etiology Denies anginal symptoms Risk factors managed  Smoker Stop smoking >3 years Continued smoking cessation  Copd Reports  breathing is stable, currently not on inhalers No issues   Total encounter time more than 25 minutes  Greater than 50% was spent in counseling and coordination of care with the patient   Orders Placed This Encounter  Procedures   EKG 12-Lead     Signed, Esmond Plants, M.D., Ph.D. 01/26/2021  [place], [place]

## 2021-01-26 ENCOUNTER — Ambulatory Visit (INDEPENDENT_AMBULATORY_CARE_PROVIDER_SITE_OTHER): Payer: Medicare HMO | Admitting: Cardiovascular Disease

## 2021-01-26 ENCOUNTER — Encounter: Payer: Self-pay | Admitting: Cardiovascular Disease

## 2021-01-26 ENCOUNTER — Other Ambulatory Visit: Payer: Self-pay

## 2021-01-26 VITALS — BP 150/90 | HR 50 | Ht 73.0 in | Wt 229.5 lb

## 2021-01-26 DIAGNOSIS — I10 Essential (primary) hypertension: Secondary | ICD-10-CM | POA: Diagnosis not present

## 2021-01-26 DIAGNOSIS — E782 Mixed hyperlipidemia: Secondary | ICD-10-CM | POA: Diagnosis not present

## 2021-01-26 DIAGNOSIS — C8300 Small cell B-cell lymphoma, unspecified site: Secondary | ICD-10-CM

## 2021-01-26 DIAGNOSIS — C8518 Unspecified B-cell lymphoma, lymph nodes of multiple sites: Secondary | ICD-10-CM

## 2021-01-26 DIAGNOSIS — I25118 Atherosclerotic heart disease of native coronary artery with other forms of angina pectoris: Secondary | ICD-10-CM | POA: Diagnosis not present

## 2021-01-26 DIAGNOSIS — Z87891 Personal history of nicotine dependence: Secondary | ICD-10-CM | POA: Diagnosis not present

## 2021-01-26 NOTE — Patient Instructions (Addendum)
Medication Instructions:  No changes  If you need a refill on your cardiac medications before your next appointment, please call your pharmacy.    Lab work: No new labs needed   If you have labs (blood work) drawn today and your tests are completely normal, you will receive your results only by: . MyChart Message (if you have MyChart) OR . A paper copy in the mail If you have any lab test that is abnormal or we need to change your treatment, we will call you to review the results.   Testing/Procedures: No new testing needed   Follow-Up: At [facility], you and your health needs are our priority.  As part of our continuing mission to provide you with exceptional heart care, we have created designated Provider Care Teams.  These Care Teams include your primary Cardiologist (physician) and Advanced Practice Providers (APPs -  Physician Assistants and Nurse Practitioners) who all work together to provide you with the care you need, when you need it.  . You will need a follow up appointment in 6 months  . Providers on your designated Care Team:   . Christopher Berge, NP . Ryan Dunn, PA-C . Jacquelyn Visser, PA-C  Any Other Special Instructions Will Be Listed Below (If Applicable).  COVID-19 Vaccine Information can be found at: [email or web address] For questions related to vaccine distribution or appointments, please email [email or web address] or call [phone].     

## 2021-02-17 ENCOUNTER — Encounter: Payer: Self-pay | Admitting: Oncology

## 2021-02-17 ENCOUNTER — Inpatient Hospital Stay: Payer: Medicare HMO | Attending: Oncology

## 2021-02-17 ENCOUNTER — Inpatient Hospital Stay (HOSPITAL_BASED_OUTPATIENT_CLINIC_OR_DEPARTMENT_OTHER): Payer: Medicare HMO | Admitting: Oncology

## 2021-02-17 ENCOUNTER — Other Ambulatory Visit: Payer: Self-pay

## 2021-02-17 VITALS — BP 150/93 | HR 49 | Temp 97.7°F | Resp 18 | Wt 229.0 lb

## 2021-02-17 DIAGNOSIS — M47816 Spondylosis without myelopathy or radiculopathy, lumbar region: Secondary | ICD-10-CM | POA: Diagnosis not present

## 2021-02-17 DIAGNOSIS — M549 Dorsalgia, unspecified: Secondary | ICD-10-CM | POA: Insufficient documentation

## 2021-02-17 DIAGNOSIS — C8339 Diffuse large B-cell lymphoma, extranodal and solid organ sites: Secondary | ICD-10-CM | POA: Diagnosis not present

## 2021-02-17 DIAGNOSIS — Z7982 Long term (current) use of aspirin: Secondary | ICD-10-CM | POA: Insufficient documentation

## 2021-02-17 DIAGNOSIS — Z79899 Other long term (current) drug therapy: Secondary | ICD-10-CM | POA: Diagnosis not present

## 2021-02-17 DIAGNOSIS — G629 Polyneuropathy, unspecified: Secondary | ICD-10-CM | POA: Diagnosis not present

## 2021-02-17 DIAGNOSIS — G8929 Other chronic pain: Secondary | ICD-10-CM | POA: Insufficient documentation

## 2021-02-17 DIAGNOSIS — Z8572 Personal history of non-Hodgkin lymphomas: Secondary | ICD-10-CM | POA: Insufficient documentation

## 2021-02-17 DIAGNOSIS — Z9221 Personal history of antineoplastic chemotherapy: Secondary | ICD-10-CM | POA: Diagnosis not present

## 2021-02-17 DIAGNOSIS — C83398 Diffuse large b-cell lymphoma of other extranodal and solid organ sites: Secondary | ICD-10-CM

## 2021-02-17 DIAGNOSIS — I10 Essential (primary) hypertension: Secondary | ICD-10-CM | POA: Diagnosis not present

## 2021-02-17 DIAGNOSIS — F17210 Nicotine dependence, cigarettes, uncomplicated: Secondary | ICD-10-CM | POA: Diagnosis not present

## 2021-02-17 DIAGNOSIS — R2681 Unsteadiness on feet: Secondary | ICD-10-CM

## 2021-02-17 LAB — CBC WITH DIFFERENTIAL/PLATELET
Abs Immature Granulocytes: 0.02 10*3/uL (ref 0.00–0.07)
Basophils Absolute: 0 10*3/uL (ref 0.0–0.1)
Basophils Relative: 1 %
Eosinophils Absolute: 0.3 10*3/uL (ref 0.0–0.5)
Eosinophils Relative: 4 %
HCT: 39.3 % (ref 39.0–52.0)
Hemoglobin: 13.3 g/dL (ref 13.0–17.0)
Immature Granulocytes: 0 %
Lymphocytes Relative: 12 %
Lymphs Abs: 0.8 10*3/uL (ref 0.7–4.0)
MCH: 30.2 pg (ref 26.0–34.0)
MCHC: 33.8 g/dL (ref 30.0–36.0)
MCV: 89.1 fL (ref 80.0–100.0)
Monocytes Absolute: 0.6 10*3/uL (ref 0.1–1.0)
Monocytes Relative: 9 %
Neutro Abs: 4.9 10*3/uL (ref 1.7–7.7)
Neutrophils Relative %: 74 %
Platelets: 161 10*3/uL (ref 150–400)
RBC: 4.41 MIL/uL (ref 4.22–5.81)
RDW: 12.8 % (ref 11.5–15.5)
WBC: 6.6 10*3/uL (ref 4.0–10.5)
nRBC: 0 % (ref 0.0–0.2)

## 2021-02-17 LAB — COMPREHENSIVE METABOLIC PANEL
ALT: 11 U/L (ref 0–44)
AST: 18 U/L (ref 15–41)
Albumin: 3.6 g/dL (ref 3.5–5.0)
Alkaline Phosphatase: 97 U/L (ref 38–126)
Anion gap: 9 (ref 5–15)
BUN: 15 mg/dL (ref 8–23)
CO2: 29 mmol/L (ref 22–32)
Calcium: 9.1 mg/dL (ref 8.9–10.3)
Chloride: 101 mmol/L (ref 98–111)
Creatinine, Ser: 1.07 mg/dL (ref 0.61–1.24)
GFR, Estimated: >60 mL/min (ref >60)
Glucose, Bld: 100 mg/dL — ABNORMAL HIGH (ref 70–99)
Potassium: 3.4 mmol/L — ABNORMAL LOW (ref 3.5–5.1)
Sodium: 139 mmol/L (ref 135–145)
Total Bilirubin: 0.9 mg/dL (ref 0.3–1.2)
Total Protein: 7.2 g/dL (ref 6.5–8.1)

## 2021-02-17 LAB — URINALYSIS, COMPLETE (UACMP) WITH MICROSCOPIC
Bacteria, UA: NONE SEEN
Bilirubin Urine: NEGATIVE
Glucose, UA: NEGATIVE mg/dL
Ketones, ur: NEGATIVE mg/dL
Leukocytes,Ua: NEGATIVE
Nitrite: NEGATIVE
Protein, ur: NEGATIVE mg/dL
Specific Gravity, Urine: 1.009 (ref 1.005–1.030)
Squamous Epithelial / HPF: NONE SEEN (ref 0–5)
pH: 7 (ref 5.0–8.0)

## 2021-02-17 LAB — LACTATE DEHYDROGENASE: LDH: 129 U/L (ref 98–192)

## 2021-02-17 NOTE — Progress Notes (Signed)
Hematology/Oncology follow-up note [facility] Telephone:([phone]) [phone] Fax:(336) [phone]   Patient Care Team: Tracie Harrier, MD as PCP - General (Internal Medicine) Earlie Server, MD as Consulting Physician (Oncology) Lucky Cowboy Erskine Squibb, MD as Referring Physician (Vascular Surgery) Abbie Sons, MD (Urology)  REFERRING PROVIDER: Tracie Harrier, MD  REASON FOR VISIT Follow up for surveillance for diffuse large B-cell lymphoma.  HISTORY OF PRESENTING ILLNESS:  John Gallegos is a  75 y.o.  male with PMH listed below presented for follow-up for treatment of diffuse large B cell lymphoma.  Pertinent history of oncology workup includes: Patient was found to have microscopic hematuria on 02/23/2017 with 4-10 RBC's/hpf.    Urine culture was negative.having symptoms of nocturia  He is smoker. 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 He was in the [employer] and reports to be exposed to [place] in [place]. CT scan 04/20/2017 which revealed moderate left hydronephrosis and delayed excretion down to a large 4.4 cm mass in or around the left proximal ureter. There was also hazy soft tissue in the fat surrounding the celiac axis and superior mesenteric artery. Thickening of the right diaphragmatic crus. High gastrohepatic ligament lymphadenopathy measures up to 11 mm. Peripancreatic and root of small bowel mesentery adenopathy measures up to 2.6 cm.  He was seen by urologist Dr. Pilar Jarvis and he underwent cystoscopy, left retrograde pyelogram, left ureteroscopy and placement of left ureteral stent placement on 05/13/2017. Finding during the procedure that he has extrinsic compression of the ureter, possibly from lymphoma.  # Pathology:  Biopsy of paraspinal musculature at L2. Pathology results reviewed large B cell lymphoma. It is CD20 positive, CD10 positive. Flow cytometry shows a small clonal population of large B cells positive for CD10 and absent cop and lambda light  chains. The abnormal cells represent 2% of the total cells.The morphology, initial IHC panel, and flow cytometry supports classification as B-cell lymphoma, CD10 positive, with large cell features.diffuse large B-cell lymphoma, germinal center B-cell type. Discussed with pathologist Dr.Onley, Ki67 50%, MUM1 negative, MYC negative, Cycline D1 negative, CD30 negative, FISH is positive for BCL2 gene rearrangement. Negative for MYC and BCL6. 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   # Bone marrow Biopsy: negative for lymphoma involvement.  # PET scan 05/18/2017  IMPRESSION: 1. Multifocal metastatic disease within the LEFT retroperitoneum, central mesenteries, and RIGHT paraspinal musculature. 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 Recommend percutaneous biopsy of the RIGHT paraspinal musculature at L2. 2. 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 # Interim PET scan 07/28/2017 after 2 cycles of RCHOP 1. Marked improvement, with row solution of the prior bony lesions and significant reduction in activity of the central mesenteric and right gastric adenopathy. Central mesenteric nodal is currently Deauville 3, previously Deauville 4 and Deauville 5. 2. Improvement in the right paraspinal muscular activity, currently Deauville 3, previously Deauville 4. I also discussed with radiologist that there has been further improvement in the left periureteral density, likely reflecting treatment response.   # Prognostic Model for the risk of CNS lymphoma involvement per NCCN guideline,  Patient is intermediate risk with score of 2, one from being >60, and one from more than extranodal involvement>1 sites (bone, paraspinal muscle involvement). CNS prophylaxis is usually recommended if score 4-6.  CNS prophylaxis was not offered.    #08/04/2018 CT abdomen pelvis showed no evidence of acute abnormality.  Stable stranding haziness at the root of  mesentery and retroperitoneum.  No new or enlarged lymph nodes identified.  Small to moderate umbilical hernia containing fat, moderate right lower lobe atelectasis and a small loculated right pleural effusion again noted.  Cholelithiasis without CT evidence of acute cholecystitis.  Prostate enlargement.  Aortic atherosclerosis.    # Treatment:  06/21/2017 Cycle 1 RCHOP [name]. Dexamethasone 32m on Day 1,  Prednisone 80 mg daily Day 2-5. 07/12/2017 Cycle 2 RCHOP /[name].Dexamethasone 269mon Day 1, Prednisone 80 mg daily Day 2-5 08/03/2017 Cycle 3 RCHOP/onpro.Dexamethasone 2066mn Day 1, Prednisone 80 mg daily Day 2-5  08/24/2017 Cycle 4 RCHOP/onpro.Dexamethasone 72m72m Day 1, Prednisone 80 mg daily Day 2-5  09/14/2017 Cycle 5 RCHOP/onpro.Dexamethasone 72mg12mDay 1, Prednisone 80 mg daily Day 2-5  10/05/2017 Cycle 6 RCHOP/onpro.Dexamethasone 72mg 45may 1, Prednisone 80 mg daily Day 2-5   # Chronic back pain, previous MRI showed diffuse lumbar spine spondylosis. INTERVAL HISTORY 75 y.o8male with oncology history listed as above reviewed by me today presents for follow-up for diffuse large B-cell lymphoma. Patient denies any constitutional symptoms. He has had chronic back pain, which appears to be worse recently.  6 out of 10.  He continues to have difficulty balancing.  Review of Systems  Constitutional:  Positive for fatigue. Negative for appetite change, chills, fever and unexpected weight change.  HENT:   Negative for hearing loss and voice change.   Eyes:  Negative for eye problems and icterus.  Respiratory:  Negative for chest tightness, cough and shortness of breath.   Cardiovascular:  Negative for chest pain and leg swelling.  Gastrointestinal:  Negative for abdominal distention and abdominal pain.  Endocrine: Negative for hot flashes.  Genitourinary:  Negative for difficulty urinating, dysuria and frequency.   Musculoskeletal:  Positive for back pain and gait problem. Negative  for arthralgias.  Skin:  Negative for itching and rash.  Neurological:  Positive for gait problem. Negative for light-headedness and numbness.  Hematological:  Negative for adenopathy. Does not bruise/bleed easily.  Psychiatric/Behavioral:  Negative for confusion.     MEDICAL HISTORY:  Past Medical History:  Diagnosis Date   Gout    Hard of hearing    Hiatal hernia    Hypertension    Non Hodgkin's lymphoma (HCC) [id number]   B-cell, chemo tx's and in remission in 2019   Urinary incontinence    frequency    SURGICAL HISTORY: Past Surgical History:  Procedure Laterality Date   CYSTOSCOPY W/ RETROGRADES Left 08/19/2017   Procedure: CYSTOSCOPY WITH RETROGRADE PYELOGRAM;  Surgeon: StoiofAbbie Sons Location: [facility];  Service: Urology;  Laterality: Left;   CYSTOSCOPY W/ URETERAL STENT REMOVAL Left 08/19/2017   Procedure: CYSTOSCOPY WITH STENT REMOVAL;  Surgeon: StoiofAbbie Sons Location: [facility];  Service: Urology;  Laterality: Left;   CYSTOSCOPY WITH BIOPSY Left 05/13/2017   Procedure: CYSTOSCOPY WITH URETERAL BIOPSY;  Surgeon: BudzynNickie Retort Location: [facility];  Service: Urology;  Laterality: Left;   CYSTOSCOPY WITH STENT PLACEMENT Left 05/13/2017   Procedure: CYSTOSCOPY WITH STENT PLACEMENT;  Surgeon: BudzynNickie Retort Location: [facility];  Service: Urology;  Laterality: Left;   PORTA CATH INSERTION N/A 06/14/2017   Procedure: PORTA CATH INSERTION;  Surgeon: Dew, JAlgernon Huxley [facility];  Service: Cardiovascular;  Laterality: N/A;   PORTA CATH REMOVAL N/A 02/25/2020   Procedure: PORTA CATH REMOVAL;  Surgeon: Dew, JAlgernon Huxley [facility];  Service: Cardiovascular;  Laterality: N/A;   pyleostenosis     31 weeks old   right knne surgery     needle went through knee   TONSILLECTOMY     URETEROSCOPY Left 05/13/2017   Procedure: URETEROSCOPY;  Surgeon: Nickie Retort, MD;  Location: [facility];  Service: Urology;   Laterality: Left;   XI ROBOTIC ASSISTED VENTRAL HERNIA N/A 05/18/2019   Procedure: XI ROBOTIC ASSISTED UMBILICAL HERNIA;  Surgeon: Benjamine Sprague, DO;  Location: [facility];  Service: General;  Laterality: N/A;    SOCIAL HISTORY: Social History   Socioeconomic History   Marital status: Married    Spouse name: Not on file   Number of children: Not on file   Years of education: Not on file   Highest education level: Not on file  Occupational History   Not on file  Tobacco Use   Smoking status: Former    Packs/day: 0.50    Years: 50.00    Pack years: 25.00    Types: Cigarettes    Quit date: 12/12/2016    Years since quitting: 4.1   Smokeless tobacco: Never  Vaping Use   Vaping Use: Never used  Substance and Sexual Activity   Alcohol use: Yes    Alcohol/week: 2.0 standard drinks    Types: 2 Shots of liquor per week    Comment: vodka and cranberry juice-one drik daily   Drug use: No   Sexual activity: Not on file  Other Topics Concern   Not on file  Social History Narrative   Not on file   Social Determinants of Health   Financial Resource Strain: Not on file  Food Insecurity: Not on file  Transportation Needs: Not on file  Physical Activity: Not on file  Stress: Not on file  Social Connections: Not on file  Intimate Partner Violence: Not on file    FAMILY HISTORY: Family History  Problem Relation Age of Onset   Pancreatic cancer Mother    Aneurysm Mother    Heart attack Mother    Lung cancer Father    Lymphoma Sister    Prostate cancer Neg Hx    Kidney cancer Neg Hx    Bladder Cancer Neg Hx     ALLERGIES:  is allergic to amlodipine.  MEDICATIONS:  Current Outpatient Medications  Medication Sig Dispense Refill   aspirin EC 81 MG tablet Take 81 mg by mouth daily.     carvedilol (COREG) 12.5 MG tablet Take 12.5 mg by mouth 2 (two) times daily with a meal.     doxazosin (CARDURA) 2 MG tablet Take 1 tablet (2 mg total) by mouth 2 (two) times daily. 180 tablet 3    gabapentin (NEURONTIN) 300 MG capsule TAKE 1 CAPSULE(300 MG) BY MOUTH AT BEDTIME 90 capsule 1   hydrALAZINE (APRESOLINE) 100 MG tablet Take 100 mg by mouth 2 (two) times daily.     losartan-hydrochlorothiazide (HYZAAR) 100-25 MG tablet Take 1 tablet by mouth daily.     potassium chloride SA (KLOR-CON) 20 MEQ tablet Monday, Wednesday and Friday.     pravastatin (PRAVACHOL) 40 MG tablet Take 1 tablet (40 mg total) by mouth at bedtime. 30 tablet 2   zolpidem (AMBIEN) 10 MG tablet Take 10 mg by mouth at bedtime.     No current facility-administered medications for this visit.      Marland Kitchen  PHYSICAL EXAMINATION: ECOG PERFORMANCE STATUS: 0 - Asymptomatic Vitals:   02/17/21 1319  BP: (!) 150/93  Pulse: (!) 49  Resp: 18  Temp: 97.7 F (36.5 C)   Filed Weights   02/17/21 1319  Weight: 229 lb (103.9 kg)    Physical Exam Constitutional:      General: He is not in acute distress.    Appearance: He is not diaphoretic.  HENT:     Head: Normocephalic and atraumatic.     Nose: Nose normal.     Mouth/Throat:     Pharynx: No oropharyngeal exudate.  Eyes:     General: No scleral icterus.       Right eye: No discharge.        Left eye: No discharge.     Conjunctiva/sclera: Conjunctivae normal.     Pupils: Pupils are equal, round, and reactive to light.  Neck:     Vascular: No JVD.  Cardiovascular:     Rate and Rhythm: Normal rate and regular rhythm.     Heart sounds: Normal heart sounds. No murmur heard.   No gallop.  Pulmonary:     Effort: Pulmonary effort is normal. No respiratory distress.     Breath sounds: Normal breath sounds. No wheezing or rales.  Chest:     Chest wall: No tenderness.  Abdominal:     General: Bowel sounds are normal. There is no distension.     Palpations: Abdomen is soft. There is no mass.     Tenderness: There is no abdominal tenderness. There is no rebound.  Musculoskeletal:        General: No tenderness or deformity. Normal range of motion.      Cervical back: Normal range of motion and neck supple.  Lymphadenopathy:     Cervical: No cervical adenopathy.  Skin:    General: Skin is warm and dry.     Findings: No erythema or rash.     Comments: Scattered chronic skin pigmentation Skin tag  Neurological:     Mental Status: He is alert and oriented to person, place, and time.     Cranial Nerves: No cranial nerve deficit.     Motor: No abnormal muscle tone.     Coordination: Coordination normal.     Gait: Gait is intact.     Comments: Decreased knee reflex on the right. Abnormal gait.  Psychiatric:        Mood and Affect: Affect normal.        Judgment: Judgment normal.    LABORATORY DATA:  I have reviewed the data as listed Lab Results  Component Value Date   WBC 6.6 02/17/2021   HGB 13.3 02/17/2021   HCT 39.3 02/17/2021   MCV 89.1 02/17/2021   PLT 161 02/17/2021   Recent Labs    09/14/20 0309 09/15/20 0555 02/17/21 1239  NA 137 141 139  K 3.8 4.3 3.4*  CL 102 101 101  CO2 _0 GLUCOSE 156* 157* 100*  BUN 43* 40* 15  CREATININE 1.14 1.05 1.07  CALCIUM 8.5* 8.9 9.1  GFRNONAA >60 >60 >60  PROT 6.6 6.4* 7.2  ALBUMIN 2.9* 3.0* 3.6  AST _1 ALT _2 ALKPHOS 62 65 97  BILITOT 0.7 0.6 0.9    Hepatitis B antigen negative. 2-D echo.showed LVEF 60%  RADIOGRAPHIC STUDIES: I have personally reviewed the radiological images as listed and agreed with the findings in the report. 08/04/2018 CT abdomen and pelvis w contrast 1. No evidence of acute abnormality. 2. Stable stranding/haziness at the root of the mesentery and retroperitoneum. No new or enlarged lymph  nodes identified. 3. Small to moderate umbilical hernia containing fat, moderate RIGHT LOWER lobe atelectasis and small loculated RIGHT pleural effusion again noted. 4. Cholelithiasis without CT evidence of acute cholecystitis. 5. Prostate enlargement 6.  Aortic Atherosclerosis (ICD10-I70.0).    ASSESSMENT & PLAN:  This is a 75 y.o. male  with stage IV diffuse large B-cell lymphoma, s/p first-line chemotherapy with R CHOP with curative intent, present for follow-up.  1. Diffuse large B-cell lymphoma of extranodal site excluding spleen and other solid organs ([place])   2. Neuropathy   3. Unsteady gait when walking    # DLBCL, clinically doing well, in complete remission.  It has been 4 + years after he finishes treatment Labs are reviewed and discussed with patient normal LDH.   Continue follow-up every 6 months. Imaging as clinically indicated.    #Worsened back pain and unsteady gait, weak knee reflex 09/06/2018 MRI lumbar showed diffuse lumbar spine spondylosis. I discussed with patient primary care provider Dr. Ginette Pitman who will arrange patient to repeat an MRI and see him in the office for further evaluation. #Chronic neuropathy, continue gabapentin as needed. return of visit: lab (cbc, cmp, LDH, ) /MD in 6 months.  Orders Placed This Encounter  Procedures   CBC with Differential/Platelet    Standing Status:   Future    Standing Expiration Date:   02/17/2022   Comprehensive metabolic panel    Standing Status:   Future    Standing Expiration Date:   02/17/2022   Lactate dehydrogenase    Standing Status:   Future    Standing Expiration Date:   02/17/2022   Earlie Server, MD, PhD Hematology Oncology [facility] at [facility] Pager- [phone] 02/17/21

## 2021-02-17 NOTE — Progress Notes (Signed)
Patient here for follow up. Pt reports having neuropathy and balance issues.

## 2021-02-19 ENCOUNTER — Other Ambulatory Visit: Payer: Self-pay | Admitting: Internal Medicine

## 2021-02-19 ENCOUNTER — Other Ambulatory Visit (HOSPITAL_COMMUNITY): Payer: Self-pay | Admitting: Internal Medicine

## 2021-02-19 DIAGNOSIS — H9190 Unspecified hearing loss, unspecified ear: Secondary | ICD-10-CM | POA: Diagnosis not present

## 2021-02-19 DIAGNOSIS — G4700 Insomnia, unspecified: Secondary | ICD-10-CM | POA: Diagnosis not present

## 2021-02-19 DIAGNOSIS — C8339 Diffuse large B-cell lymphoma, extranodal and solid organ sites: Secondary | ICD-10-CM | POA: Diagnosis not present

## 2021-02-19 DIAGNOSIS — M5416 Radiculopathy, lumbar region: Secondary | ICD-10-CM

## 2021-02-19 DIAGNOSIS — M47816 Spondylosis without myelopathy or radiculopathy, lumbar region: Secondary | ICD-10-CM

## 2021-02-19 DIAGNOSIS — I10 Essential (primary) hypertension: Secondary | ICD-10-CM | POA: Diagnosis not present

## 2021-03-02 ENCOUNTER — Other Ambulatory Visit: Payer: Self-pay

## 2021-03-02 ENCOUNTER — Ambulatory Visit
Admission: RE | Admit: 2021-03-02 | Discharge: 2021-03-02 | Disposition: A | Discharge status: Home / Self Care | Payer: Medicare HMO | Source: Ambulatory Visit | Attending: Internal Medicine | Admitting: Internal Medicine

## 2021-03-02 DIAGNOSIS — M5416 Radiculopathy, lumbar region: Secondary | ICD-10-CM | POA: Insufficient documentation

## 2021-03-02 DIAGNOSIS — M47816 Spondylosis without myelopathy or radiculopathy, lumbar region: Secondary | ICD-10-CM | POA: Insufficient documentation

## 2021-03-02 DIAGNOSIS — M545 Low back pain, unspecified: Secondary | ICD-10-CM | POA: Diagnosis not present

## 2021-03-02 MED ORDER — GADOBUTROL 1 MMOL/ML IV SOLN
10.0000 mL | Freq: Once | INTRAVENOUS | Status: AC | PRN
  Administered 2021-03-02: 10 mL via INTRAVENOUS

## 2021-03-10 DIAGNOSIS — M5416 Radiculopathy, lumbar region: Secondary | ICD-10-CM | POA: Diagnosis not present

## 2021-03-10 DIAGNOSIS — M48062 Spinal stenosis, lumbar region with neurogenic claudication: Secondary | ICD-10-CM | POA: Diagnosis not present

## 2021-03-10 DIAGNOSIS — M5136 Other intervertebral disc degeneration, lumbar region: Secondary | ICD-10-CM | POA: Diagnosis not present

## 2021-04-02 DIAGNOSIS — M48062 Spinal stenosis, lumbar region with neurogenic claudication: Secondary | ICD-10-CM | POA: Diagnosis not present

## 2021-04-02 DIAGNOSIS — M5416 Radiculopathy, lumbar region: Secondary | ICD-10-CM | POA: Diagnosis not present

## 2021-04-21 DIAGNOSIS — J302 Other seasonal allergic rhinitis: Secondary | ICD-10-CM | POA: Diagnosis not present

## 2021-04-21 DIAGNOSIS — M1990 Unspecified osteoarthritis, unspecified site: Secondary | ICD-10-CM | POA: Diagnosis not present

## 2021-04-21 DIAGNOSIS — E7800 Pure hypercholesterolemia, unspecified: Secondary | ICD-10-CM | POA: Diagnosis not present

## 2021-04-21 DIAGNOSIS — H9190 Unspecified hearing loss, unspecified ear: Secondary | ICD-10-CM | POA: Diagnosis not present

## 2021-04-21 DIAGNOSIS — I700 Atherosclerosis of aorta: Secondary | ICD-10-CM | POA: Diagnosis not present

## 2021-04-21 DIAGNOSIS — C8339 Diffuse large B-cell lymphoma, extranodal and solid organ sites: Secondary | ICD-10-CM | POA: Diagnosis not present

## 2021-04-21 DIAGNOSIS — F5104 Psychophysiologic insomnia: Secondary | ICD-10-CM | POA: Diagnosis not present

## 2021-04-21 DIAGNOSIS — Z131 Encounter for screening for diabetes mellitus: Secondary | ICD-10-CM | POA: Diagnosis not present

## 2021-04-21 DIAGNOSIS — I10 Essential (primary) hypertension: Secondary | ICD-10-CM | POA: Diagnosis not present

## 2021-04-24 DIAGNOSIS — M48062 Spinal stenosis, lumbar region with neurogenic claudication: Secondary | ICD-10-CM | POA: Diagnosis not present

## 2021-04-24 DIAGNOSIS — M5416 Radiculopathy, lumbar region: Secondary | ICD-10-CM | POA: Diagnosis not present

## 2021-04-24 DIAGNOSIS — M5136 Other intervertebral disc degeneration, lumbar region: Secondary | ICD-10-CM | POA: Diagnosis not present

## 2021-07-20 ENCOUNTER — Other Ambulatory Visit: Payer: Self-pay | Admitting: Cardiovascular Disease

## 2021-07-20 NOTE — Telephone Encounter (Signed)
Scheduled

## 2021-07-20 NOTE — Telephone Encounter (Signed)
Please schedule 6 month F/U appointment. Thank you! 

## 2021-08-17 NOTE — Progress Notes (Signed)
Cardiology Office Note  Date:  08/18/2021   ID:  John Gallegos, John Gallegos 1946-01-20, MRN [id number]  PCP:  Tracie Harrier, MD   Chief Complaint  Patient presents with   6 month follow up     "Doing well." Medications reviewed by the patient verbally.     HPI:  76 year old male with past medical history of Smoker, quit in 05/2017  HTN diffuse large B cell lymphoma ([place] summer 2018, blood in urine) CT scan 04/20/2017 which revealed moderate left hydronephrosis and delayed excretion down to a large 4.4 cm mass in or around the left proximal ureter. 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 09/2020 Dehydrated, CR 1.77 Weakness Hypokalemia Treated with steroids Losartan HCTZ was held at d/c  History of lymphoma, has finished treatment Has neuropathy  Echo 06/07/2017 Normal EF 60%  previous imaging studies reviewed with him in detail  PET scan 05/18/2017  IMPRESSION: 1. Multifocal metastatic disease within the LEFT retroperitoneum, central mesenteries, and RIGHT paraspinal musculature.Two foci of skeletal metastasis  RCHOP chemotherapy, completed 4 rounds rounds, 2 more to go  PMH:   has a past medical history of Gout, Hard of hearing, Hiatal hernia, Hypertension, Non Hodgkin's lymphoma ([place]) (04/2017), and Urinary incontinence.  PSH:    Past Surgical History:  Procedure Laterality Date   CYSTOSCOPY W/ RETROGRADES Left 08/19/2017   Procedure: CYSTOSCOPY WITH  RETROGRADE PYELOGRAM;  Surgeon: Abbie Sons, MD;  Location: [facility];  Service: Urology;  Laterality: Left;   CYSTOSCOPY W/ URETERAL STENT REMOVAL Left 08/19/2017   Procedure: CYSTOSCOPY WITH STENT REMOVAL;  Surgeon: Abbie Sons, MD;  Location: [facility];  Service: Urology;  Laterality: Left;   CYSTOSCOPY WITH BIOPSY Left 05/13/2017   Procedure: CYSTOSCOPY WITH URETERAL BIOPSY;  Surgeon: Nickie Retort, MD;  Location: [facility];  Service: Urology;  Laterality: Left;   CYSTOSCOPY WITH STENT PLACEMENT Left 05/13/2017   Procedure: CYSTOSCOPY WITH STENT PLACEMENT;  Surgeon: Nickie Retort, MD;  Location: [facility];  Service: Urology;  Laterality: Left;   PORTA CATH INSERTION N/A 06/14/2017   Procedure: PORTA CATH INSERTION;  Surgeon: Algernon Huxley, MD;  Location: [place] [facility];  Service: Cardiovascular;  Laterality: N/A;   PORTA CATH REMOVAL N/A 02/25/2020   Procedure: PORTA CATH REMOVAL;  Surgeon: Algernon Huxley, MD;  Location: [place] [facility];  Service: Cardiovascular;  Laterality: N/A;   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     carvedilol (COREG) 12.5 MG tablet Take 12.5 mg by mouth 2 (two) times daily with a meal.     doxazosin (CARDURA) 2 MG tablet TAKE 1 TABLET(2 MG) BY MOUTH TWICE DAILY 180 tablet 0   gabapentin (NEURONTIN) 300 MG capsule TAKE 1 CAPSULE(300 MG) BY MOUTH AT BEDTIME 90 capsule 1   hydrALAZINE (APRESOLINE) 100 MG tablet Take 100 mg by mouth  2 (two) times daily.     losartan-hydrochlorothiazide (HYZAAR) 100-25 MG tablet Take 1 tablet by mouth daily.     meloxicam (MOBIC) 7.5 MG tablet Take 7.5 mg by mouth daily as needed.     potassium chloride SA (KLOR-CON) 20 MEQ tablet Monday, Wednesday and Friday.     pravastatin (PRAVACHOL) 40 MG tablet Take 1 tablet (40 mg total) by mouth at bedtime. 30 tablet 2   zolpidem (AMBIEN) 10 MG tablet Take 10 mg by mouth at bedtime.     No current facility-administered medications for this visit.    Allergies:   Amlodipine   Social History:  The patient  reports that he quit smoking about 4 years ago. His smoking use included cigarettes. He has a 25.00 pack-year smoking history. He has never used smokeless tobacco. He reports current alcohol use of about 2.0 standard drinks per week. He reports that he does not use drugs.   Family History:   family history includes Aneurysm in his mother; Heart attack in his mother; Lung cancer in his father; Lymphoma in his sister; Pancreatic cancer in his mother.    Review of Systems: Review of Systems  Constitutional: Negative.   HENT: Negative.    Respiratory: Negative.    Cardiovascular: Negative.   Gastrointestinal: Negative.   Musculoskeletal:  Positive for joint pain.       Nerve pain  Neurological: Negative.   Psychiatric/Behavioral: Negative.    All other systems reviewed and are negative.  PHYSICAL EXAM: VS:  BP 140/84 (BP Location: Left Arm, Patient Position: Sitting, Cuff Size: Normal)    Pulse (!) 51    Ht 6\' 1"  (1.854 m)    Wt 231 lb 8 oz (105 kg)    SpO2 98%    BMI 30.54 kg/m  , BMI Body mass index is 30.54 kg/m. Constitutional:  oriented to person, place, and time. No distress.  HENT:  Head: Grossly normal Eyes:  no discharge. No scleral icterus.  Neck: No JVD, no carotid bruits  Cardiovascular: Regular rate and rhythm, no murmurs appreciated Pulmonary/Chest: Clear to auscultation bilaterally, no wheezes or rails Abdominal: Soft.   no distension.  no tenderness.  Musculoskeletal: Normal range of motion Neurological:  normal muscle tone. Coordination normal. No atrophy Skin: Skin warm and dry Psychiatric: normal affect, pleasant  Recent Labs: 09/15/2020: Magnesium 2.1 02/17/2021: ALT 11; BUN 15; Creatinine, Ser 1.07; Hemoglobin 13.3; Platelets 161; Potassium 3.4; Sodium 139    Lipid Panel No results found for: CHOL, HDL, LDLCALC, TRIG    Wt Readings from Last 3 Encounters:  08/18/21 231 lb 8 oz (105 kg)  02/17/21 229 lb (103.9 kg)  01/26/21 229 lb 8 oz (104.1 kg)     ASSESSMENT AND PLAN:  Diffuse large B-cell lymphoma of extranodal site excluding spleen and other solid organs (HCC) Completed RCHOP Followed by oncology, no recent treatment stable  Mixed hyperlipidemia Off statin, Suggested he restart , script sent in  Essential hypertension Coreg down to 6.25 mg twice a day given bradycardia, rate 51 bpm He will continue to monitor  heart rate at home and call us with numbers  Chest pain with moderate risk for cardiac etiology Denies anginal symptoms Risk factors managed  Smoker Stop smoking >3 years Continued smoking cessation  Copd Reports breathing is stable, currently not on inhalers No issues   Total encounter time more than 25 minutes  Greater than 50% was spent in counseling and coordination of care with the patient   Orders Placed This Encounter  Procedures   EKG 12-Lead     Signed, Esmond Plants, M.D., Ph.D. 08/18/2021  [place], [place]

## 2021-08-18 ENCOUNTER — Other Ambulatory Visit: Payer: Self-pay

## 2021-08-18 ENCOUNTER — Ambulatory Visit: Payer: Medicare HMO | Admitting: Cardiovascular Disease

## 2021-08-18 ENCOUNTER — Encounter: Payer: Self-pay | Admitting: Cardiovascular Disease

## 2021-08-18 VITALS — BP 140/84 | HR 51 | Ht 73.0 in | Wt 231.5 lb

## 2021-08-18 DIAGNOSIS — C8300 Small cell B-cell lymphoma, unspecified site: Secondary | ICD-10-CM

## 2021-08-18 DIAGNOSIS — Z87891 Personal history of nicotine dependence: Secondary | ICD-10-CM | POA: Diagnosis not present

## 2021-08-18 DIAGNOSIS — I10 Essential (primary) hypertension: Secondary | ICD-10-CM | POA: Diagnosis not present

## 2021-08-18 DIAGNOSIS — R079 Chest pain, unspecified: Secondary | ICD-10-CM | POA: Diagnosis not present

## 2021-08-18 DIAGNOSIS — E782 Mixed hyperlipidemia: Secondary | ICD-10-CM

## 2021-08-18 DIAGNOSIS — I25118 Atherosclerotic heart disease of native coronary artery with other forms of angina pectoris: Secondary | ICD-10-CM | POA: Diagnosis not present

## 2021-08-18 MED ORDER — PRAVASTATIN SODIUM 40 MG PO TABS: 40.0000 mg | ORAL_TABLET | Freq: Every day | ORAL | 3 refills | Status: DC

## 2021-08-18 MED ORDER — CARVEDILOL 6.25 MG PO TABS: 6.2500 mg | ORAL_TABLET | Freq: Two times a day (BID) | ORAL | 3 refills | Status: DC

## 2021-08-18 NOTE — Patient Instructions (Addendum)
Medication Instructions:  Please decrease  coreg down to 6.25 mg twice a day  If you need a refill on your cardiac medications before your next appointment, please call your pharmacy.   Lab work: No new labs needed  Testing/Procedures: No new testing needed  Follow-Up: At [facility], you and your health needs are our priority.  As part of our continuing mission to provide you with exceptional heart care, we have created designated Provider Care Teams.  These Care Teams include your primary Cardiologist (physician) and Advanced Practice Providers (APPs -  Physician Assistants and Nurse Practitioners) who all work together to provide you with the care you need, when you need it.  You will need a follow up appointment in, 6 months APP  Providers on your designated Care Team:   Murray Hodgkins, NP Christell Faith, PA-C Cadence Kathlen Mody, [place]  COVID-19 Vaccine Information can be found at: [email or web address].uk For questions related to vaccine distribution or appointments, please email [email or web address] or call [phone].

## 2021-08-21 ENCOUNTER — Inpatient Hospital Stay: Payer: Medicare HMO | Admitting: Oncology

## 2021-08-21 ENCOUNTER — Other Ambulatory Visit: Payer: Self-pay

## 2021-08-21 ENCOUNTER — Encounter: Payer: Self-pay | Admitting: Oncology

## 2021-08-21 ENCOUNTER — Inpatient Hospital Stay: Payer: Medicare HMO | Attending: Oncology

## 2021-08-21 VITALS — BP 149/78 | Temp 97.0°F | Wt 230.3 lb

## 2021-08-21 DIAGNOSIS — G629 Polyneuropathy, unspecified: Secondary | ICD-10-CM | POA: Insufficient documentation

## 2021-08-21 DIAGNOSIS — C8339 Diffuse large B-cell lymphoma, extranodal and solid organ sites: Secondary | ICD-10-CM | POA: Diagnosis not present

## 2021-08-21 DIAGNOSIS — Z7982 Long term (current) use of aspirin: Secondary | ICD-10-CM | POA: Insufficient documentation

## 2021-08-21 DIAGNOSIS — F1090 Alcohol use, unspecified, uncomplicated: Secondary | ICD-10-CM

## 2021-08-21 DIAGNOSIS — Z87891 Personal history of nicotine dependence: Secondary | ICD-10-CM | POA: Insufficient documentation

## 2021-08-21 DIAGNOSIS — Z9221 Personal history of antineoplastic chemotherapy: Secondary | ICD-10-CM | POA: Insufficient documentation

## 2021-08-21 DIAGNOSIS — D696 Thrombocytopenia, unspecified: Secondary | ICD-10-CM | POA: Insufficient documentation

## 2021-08-21 DIAGNOSIS — C83398 Diffuse large b-cell lymphoma of other extranodal and solid organ sites: Secondary | ICD-10-CM

## 2021-08-21 DIAGNOSIS — I10 Essential (primary) hypertension: Secondary | ICD-10-CM | POA: Insufficient documentation

## 2021-08-21 DIAGNOSIS — Z789 Other specified health status: Secondary | ICD-10-CM

## 2021-08-21 DIAGNOSIS — Z8572 Personal history of non-Hodgkin lymphomas: Secondary | ICD-10-CM | POA: Diagnosis not present

## 2021-08-21 DIAGNOSIS — Z79899 Other long term (current) drug therapy: Secondary | ICD-10-CM | POA: Diagnosis not present

## 2021-08-21 LAB — CBC WITH DIFFERENTIAL/PLATELET
Abs Immature Granulocytes: 0.02 10*3/uL (ref 0.00–0.07)
Basophils Absolute: 0 10*3/uL (ref 0.0–0.1)
Basophils Relative: 1 %
Eosinophils Absolute: 0.2 10*3/uL (ref 0.0–0.5)
Eosinophils Relative: 3 %
HCT: 39.7 % (ref 39.0–52.0)
Hemoglobin: 13.6 g/dL (ref 13.0–17.0)
Immature Granulocytes: 0 %
Lymphocytes Relative: 13 %
Lymphs Abs: 0.7 10*3/uL (ref 0.7–4.0)
MCH: 31.1 pg (ref 26.0–34.0)
MCHC: 34.3 g/dL (ref 30.0–36.0)
MCV: 90.8 fL (ref 80.0–100.0)
Monocytes Absolute: 0.5 10*3/uL (ref 0.1–1.0)
Monocytes Relative: 9 %
Neutro Abs: 4.2 10*3/uL (ref 1.7–7.7)
Neutrophils Relative %: 74 %
Platelets: 147 10*3/uL — ABNORMAL LOW (ref 150–400)
RBC: 4.37 MIL/uL (ref 4.22–5.81)
RDW: 12.4 % (ref 11.5–15.5)
WBC: 5.6 10*3/uL (ref 4.0–10.5)
nRBC: 0 % (ref 0.0–0.2)

## 2021-08-21 LAB — COMPREHENSIVE METABOLIC PANEL
ALT: 12 U/L (ref 0–44)
AST: 18 U/L (ref 15–41)
Albumin: 3.8 g/dL (ref 3.5–5.0)
Alkaline Phosphatase: 83 U/L (ref 38–126)
Anion gap: 7 (ref 5–15)
BUN: 21 mg/dL (ref 8–23)
CO2: 27 mmol/L (ref 22–32)
Calcium: 8.5 mg/dL — ABNORMAL LOW (ref 8.9–10.3)
Chloride: 101 mmol/L (ref 98–111)
Creatinine, Ser: 1.04 mg/dL (ref 0.61–1.24)
GFR, Estimated: >60 mL/min (ref >60)
Glucose, Bld: 106 mg/dL — ABNORMAL HIGH (ref 70–99)
Potassium: 3.1 mmol/L — ABNORMAL LOW (ref 3.5–5.1)
Sodium: 135 mmol/L (ref 135–145)
Total Bilirubin: 0.6 mg/dL (ref 0.3–1.2)
Total Protein: 7.2 g/dL (ref 6.5–8.1)

## 2021-08-21 LAB — LACTATE DEHYDROGENASE: LDH: 132 U/L (ref 98–192)

## 2021-08-21 NOTE — Progress Notes (Signed)
°Hematology/Oncology follow-up note ° °[phone] [phone] ° ° °Patient Care Team: °Hande, Vishwanath, MD as PCP - General (Internal Medicine) °Yu, Zhou, MD as Consulting Physician (Oncology) °Dew, Jason S, MD as Referring Physician (Vascular Surgery) °Stoioff, Scott C, MD (Urology) ° °REFERRING PROVIDER: °Hande, Vishwanath, MD ° °REASON FOR VISIT °Follow up for surveillance for diffuse large B-cell lymphoma. ° °HISTORY OF PRESENTING ILLNESS:  °John Gallegos is a  76 y.o.  male with PMH listed below presented for follow-up for treatment of diffuse large B cell lymphoma.  °Pertinent history of oncology workup includes: °Patient was found to have microscopic hematuria on 02/23/2017 with 4-10 RBC's/hpf.    Urine culture was negative.having symptoms of nocturia ° He is smoker. 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 He was in the [employer] and reports to be exposed to Agent Orange in [place]. CT scan 04/20/2017 which revealed moderate left hydronephrosis and delayed excretion down to a large 4.4 cm mass in or around the left proximal ureter. There was also hazy soft tissue in the fat surrounding the celiac axis and superior mesenteric artery. Thickening of the right diaphragmatic crus. High gastrohepatic ligament lymphadenopathy measures up to 11 mm. Peripancreatic and root of small bowel mesentery adenopathy measures up to 2.6 cm.  °He was seen by urologist Dr. Budzyn and he underwent cystoscopy, left retrograde pyelogram, left ureteroscopy and placement of left ureteral stent placement on 05/13/2017. Finding during the procedure that he has extrinsic compression of the ureter, possibly from lymphoma. ° # Pathology:  °Biopsy of paraspinal musculature at L2. Pathology results reviewed large B cell lymphoma. It is CD20 positive, CD10 positive. Flow cytometry shows a small clonal population of large B cells positive for CD10 and absent cop and lambda light chains. The abnormal cells  represent 2% of the total cells.The morphology, initial IHC panel, and flow cytometry supports classification as B-cell lymphoma, CD10 positive, with large cell features.diffuse large B-cell lymphoma, germinal center B-cell type. Discussed with pathologist Dr.Onley, Ki67 50%, MUM1 negative, MYC negative, Cycline D1 negative, CD30 negative, FISH is positive for BCL2 gene rearrangement. Negative for MYC and BCL6. 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  ° °# Bone marrow Biopsy: negative for lymphoma involvement. ° °# PET scan 05/18/2017  IMPRESSION: °1. Multifocal metastatic disease within the LEFT retroperitoneum, central mesenteries, and RIGHT paraspinal musculature. 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 Recommend percutaneous biopsy of the RIGHT paraspinal musculature at L2. 2. 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 °# Interim PET scan 07/28/2017 after 2 cycles of RCHOP °1. Marked improvement, with row solution of the prior bony lesions and significant reduction in activity of the central mesenteric and °right gastric adenopathy. Central mesenteric nodal is currently Deauville 3, previously Deauville 4 and Deauville 5. °2. Improvement in the right paraspinal muscular activity, currently Deauville 3, previously Deauville 4. °I also discussed with radiologist that there has been further improvement in the left periureteral density, likely reflecting treatment response.  ° °# Prognostic Model for the risk of CNS lymphoma involvement per NCCN guideline,  °Patient is intermediate risk with score of 2, one from being >60, and one from more than extranodal involvement>1 sites (bone, paraspinal muscle involvement). CNS prophylaxis is usually recommended if score 4-6.  CNS prophylaxis was not offered.   ° °#08/04/2018 CT abdomen pelvis showed no evidence of acute abnormality.  Stable stranding haziness at the root of mesentery and  retroperitoneum.    No new or enlarged lymph nodes identified.  Small to moderate umbilical hernia containing fat, moderate right lower lobe atelectasis and a small loculated right pleural effusion again noted.  Cholelithiasis without CT evidence of acute cholecystitis.  Prostate enlargement.  Aortic atherosclerosis.   ° °# Treatment:  °06/21/2017 Cycle 1 RCHOP /neulasta. Dexamethasone 20mg on Day 1,  Prednisone 80 mg daily Day 2-5. °07/12/2017 Cycle 2 RCHOP /onpro .Dexamethasone 20mg on Day 1, Prednisone 80 mg daily Day 2-5 °08/03/2017 Cycle 3 RCHOP/onpro.Dexamethasone 20mg on Day 1, Prednisone 80 mg daily Day 2-5 ° 08/24/2017 Cycle 4 RCHOP/onpro.Dexamethasone 20mg on Day 1, Prednisone 80 mg daily Day 2-5 ° 09/14/2017 Cycle 5 RCHOP/onpro.Dexamethasone 20mg on Day 1, Prednisone 80 mg daily Day 2-5 ° 10/05/2017 Cycle 6 RCHOP/onpro.Dexamethasone 20mg on Day 1, Prednisone 80 mg daily Day 2-5 ° ° °# Chronic back pain, previous MRI showed diffuse lumbar spine spondylosis. °INTERVAL HISTORY °76 y.o. male with oncology history listed as above reviewed by me today presents for follow-up for diffuse large B-cell lymphoma. °Denies weight loss, fever, chills, fatigue, night sweats.  °He has no new complaints. He drinks alcohol daily.  ° °Review of Systems  °Constitutional:  Positive for fatigue. Negative for appetite change, chills, fever and unexpected weight change.  °HENT:   Negative for hearing loss and voice change.   °Eyes:  Negative for eye problems and icterus.  °Respiratory:  Negative for chest tightness, cough and shortness of breath.   °Cardiovascular:  Negative for chest pain and leg swelling.  °Gastrointestinal:  Negative for abdominal distention and abdominal pain.  °Endocrine: Negative for hot flashes.  °Genitourinary:  Negative for difficulty urinating, dysuria and frequency.   °Musculoskeletal:  Positive for back pain and gait problem. Negative for arthralgias.  °Skin:  Negative for itching and rash.  °Neurological:   Positive for gait problem. Negative for light-headedness and numbness.  °Hematological:  Negative for adenopathy. Does not bruise/bleed easily.  °Psychiatric/Behavioral:  Negative for confusion.   ° ° °MEDICAL HISTORY:  °Past Medical History:  °Diagnosis Date  ° Gout   ° Hard of hearing   ° Hiatal hernia   ° Hypertension   ° Non Hodgkin's lymphoma (HCC) 04/2017  ° B-cell, chemo tx's and in remission in 2019  ° Urinary incontinence   ° frequency  ° ° °SURGICAL HISTORY: °Past Surgical History:  °Procedure Laterality Date  ° CYSTOSCOPY W/ RETROGRADES Left 08/19/2017  ° Procedure: CYSTOSCOPY WITH RETROGRADE PYELOGRAM;  Surgeon: Stoioff, Scott C, MD;  Location: [facility];  Service: Urology;  Laterality: Left;  ° CYSTOSCOPY W/ URETERAL STENT REMOVAL Left 08/19/2017  ° Procedure: CYSTOSCOPY WITH STENT REMOVAL;  Surgeon: Stoioff, Scott C, MD;  Location: [facility];  Service: Urology;  Laterality: Left;  ° CYSTOSCOPY WITH BIOPSY Left 05/13/2017  ° Procedure: CYSTOSCOPY WITH URETERAL BIOPSY;  Surgeon: Budzyn, Brian James, MD;  Location: [facility];  Service: Urology;  Laterality: Left;  ° CYSTOSCOPY WITH STENT PLACEMENT Left 05/13/2017  ° Procedure: CYSTOSCOPY WITH STENT PLACEMENT;  Surgeon: Budzyn, Brian James, MD;  Location: [facility];  Service: Urology;  Laterality: Left;  ° PORTA CATH INSERTION N/A 06/14/2017  ° Procedure: PORTA CATH INSERTION;  Surgeon: Dew, Jason S, MD;  Location: [facility];  Service: Cardiovascular;  Laterality: N/A;  ° PORTA CATH REMOVAL N/A 02/25/2020  ° Procedure: PORTA CATH REMOVAL;  Surgeon: Dew, Jason S, MD;  Location: [facility];  Service: Cardiovascular;  Laterality: N/A;  ° pyleostenosis    ° 12 weeks   old  ° right knne surgery    ° needle went through knee  ° TONSILLECTOMY    ° URETEROSCOPY Left 05/13/2017  ° Procedure: URETEROSCOPY;  Surgeon: Budzyn, Brian James, MD;  Location: [facility];  Service: Urology;  Laterality: Left;  ° XI ROBOTIC ASSISTED VENTRAL HERNIA N/A 05/18/2019  °  Procedure: XI ROBOTIC ASSISTED UMBILICAL HERNIA;  Surgeon: Sakai, Isami, DO;  Location: [facility];  Service: General;  Laterality: N/A;  ° ° °SOCIAL HISTORY: °Social History  ° °Socioeconomic History  ° Marital status: Legally Separated  °  Spouse name: Not on file  ° Number of children: Not on file  ° Years of education: Not on file  ° Highest education level: Not on file  °Occupational History  ° Not on file  °Tobacco Use  ° Smoking status: Former  °  Packs/day: 0.50  °  Years: 50.00  °  Pack years: 25.00  °  Types: Cigarettes  °  Quit date: 12/12/2016  °  Years since quitting: 4.6  ° Smokeless tobacco: Never  °Vaping Use  ° Vaping Use: Never used  °Substance and Sexual Activity  ° Alcohol use: Yes  °  Alcohol/week: 2.0 standard drinks  °  Types: 2 Shots of liquor per week  °  Comment: vodka and cranberry juice-one drik daily  ° Drug use: No  ° Sexual activity: Not on file  °Other Topics Concern  ° Not on file  °Social History Narrative  ° Not on file  ° °Social Determinants of Health  ° °Financial Resource Strain: Not on file  °Food Insecurity: Not on file  °Transportation Needs: Not on file  °Physical Activity: Not on file  °Stress: Not on file  °Social Connections: Not on file  °Intimate Partner Violence: Not on file  ° ° °FAMILY HISTORY: °Family History  °Problem Relation Age of Onset  ° Pancreatic cancer Mother   ° Aneurysm Mother   ° Heart attack Mother   ° Lung cancer Father   ° Lymphoma Sister   ° Prostate cancer Neg Hx   ° Kidney cancer Neg Hx   ° Bladder Cancer Neg Hx   ° ° °ALLERGIES:  is allergic to amlodipine. ° °MEDICATIONS:  °Current Outpatient Medications  °Medication Sig Dispense Refill  ° aspirin EC 81 MG tablet Take 81 mg by mouth daily.    ° carvedilol (COREG) 6.25 MG tablet Take 1 tablet (6.25 mg total) by mouth 2 (two) times daily with a meal. 180 tablet 3  ° doxazosin (CARDURA) 2 MG tablet TAKE 1 TABLET(2 MG) BY MOUTH TWICE DAILY 180 tablet 0  ° gabapentin (NEURONTIN) 300 MG capsule TAKE 1  CAPSULE(300 MG) BY MOUTH AT BEDTIME 90 capsule 1  ° hydrALAZINE (APRESOLINE) 100 MG tablet Take 100 mg by mouth 2 (two) times daily.    ° losartan-hydrochlorothiazide (HYZAAR) 100-25 MG tablet Take 1 tablet by mouth daily.    ° meloxicam (MOBIC) 7.5 MG tablet Take 7.5 mg by mouth daily as needed.    ° potassium chloride SA (KLOR-CON) 20 MEQ tablet Monday, Wednesday and Friday.    ° pravastatin (PRAVACHOL) 40 MG tablet Take 1 tablet (40 mg total) by mouth at bedtime. 90 tablet 3  ° zolpidem (AMBIEN) 10 MG tablet Take 10 mg by mouth at bedtime.    ° °No current facility-administered medications for this visit.  ° ° °  °. ° °PHYSICAL EXAMINATION: °ECOG PERFORMANCE STATUS: 0 - Asymptomatic °Vitals:  ° 08/21/21 1142  °BP: (!) 149/78  °  149/78  Temp: (!) 97 F (36.1 C)   Filed Weights   08/21/21 1142  Weight: 230 lb 4.8 oz (104.5 kg)    Physical Exam Constitutional:      General: He is not in acute distress.    Appearance: He is not diaphoretic.  HENT:     Head: Normocephalic and atraumatic.     Nose: Nose normal.     Mouth/Throat:     Pharynx: No oropharyngeal exudate.  Eyes:     General: No scleral icterus.       Right eye: No discharge.        Left eye: No discharge.     Conjunctiva/sclera: Conjunctivae normal.     Pupils: Pupils are equal, round, and reactive to light.  Neck:     Vascular: No JVD.  Cardiovascular:     Rate and Rhythm: Normal rate and regular rhythm.     Heart sounds: Normal heart sounds. No murmur heard.   No gallop.  Pulmonary:     Effort: Pulmonary effort is normal. No respiratory distress.     Breath sounds: Normal breath sounds. No wheezing or rales.  Chest:     Chest wall: No tenderness.  Abdominal:     General: Bowel sounds are normal. There is no distension.     Palpations: Abdomen is soft. There is no mass.     Tenderness: There is no abdominal tenderness. There is no rebound.  Musculoskeletal:        General: No tenderness or deformity. Normal range of motion.      Cervical back: Normal range of motion and neck supple.  Lymphadenopathy:     Cervical: No cervical adenopathy.  Skin:    General: Skin is warm and dry.     Findings: No erythema or rash.     Comments: Scattered chronic skin pigmentation Skin tag  Neurological:     Mental Status: He is alert and oriented to person, place, and time.     Cranial Nerves: No cranial nerve deficit.     Motor: No abnormal muscle tone.     Coordination: Coordination normal.     Gait: Gait is intact.     Comments: Decreased knee reflex on the right. Abnormal gait.  Psychiatric:        Mood and Affect: Affect normal.        Judgment: Judgment normal.    LABORATORY DATA:  I have reviewed the data as listed Lab Results  Component Value Date   WBC 5.6 08/21/2021   HGB 13.6 08/21/2021   HCT 39.7 08/21/2021   MCV 90.8 08/21/2021   PLT 147 (L) 08/21/2021   Recent Labs    09/15/20 0555 02/17/21 1239 08/21/21 1133  NA 141 139 135  K 4.3 3.4* 3.1*  CL 101 101 101  CO2 _0 GLUCOSE 157* 100* 106*  BUN 40* 15 21  CREATININE 1.05 1.07 1.04  CALCIUM 8.9 9.1 8.5*  GFRNONAA >60 >60 >60  PROT 6.4* 7.2 7.2  ALBUMIN 3.0* 3.6 3.8  AST _1 ALT _2 ALKPHOS 65 97 83  BILITOT 0.6 0.9 0.6    Hepatitis B antigen negative. 2-D echo.showed LVEF 60%  RADIOGRAPHIC STUDIES: I have personally reviewed the radiological images as listed and agreed with the findings in the report. 08/04/2018 CT abdomen and pelvis w contrast 1. No evidence of acute abnormality. 2. Stable stranding/haziness at the root of the mesentery and retroperitoneum. No new  or enlarged lymph nodes identified. 3. Small to moderate umbilical hernia containing fat, moderate RIGHT LOWER lobe atelectasis and small loculated RIGHT pleural effusion again noted. 4. Cholelithiasis without CT evidence of acute cholecystitis. 5. Prostate enlargement 6.  Aortic Atherosclerosis (ICD10-I70.0).    ASSESSMENT & PLAN:  This is a 76 y.o.  male with stage IV diffuse large B-cell lymphoma, s/p first-line chemotherapy with R CHOP with curative intent, present for follow-up.  1. Diffuse large B-cell lymphoma of extranodal site excluding spleen and other solid organs ([place])   2. Neuropathy   3. Thrombocytopenia ([place])   4. Alcohol use    # DLBCL, clinically doing well, in complete remission.  It has been 4 + years after he finishes treatment Labs are reviewed and discussed with patient. Imaging as indicated.   # Mild thrombocytopenia. Possible due to chronic alcohol use.  Alcohol cessation was discussed with patient.   #Chronic neuropathy, continue gabapentin as needed. return of visit: lab (cbc, cmp, LDH, B12 and folate) /MD in 6 months.  Orders Placed This Encounter  Procedures   CBC with Differential/Platelet    Standing Status:   Future    Standing Expiration Date:   08/21/2022   Comprehensive metabolic panel    Standing Status:   Future    Standing Expiration Date:   08/21/2022   Lactate dehydrogenase    Standing Status:   Future    Standing Expiration Date:   08/21/2022   Vitamin B12    Standing Status:   Future    Standing Expiration Date:   08/21/2022   Earlie Server, MD, PhD 08/21/21

## 2021-08-27 DIAGNOSIS — I10 Essential (primary) hypertension: Secondary | ICD-10-CM | POA: Diagnosis not present

## 2021-09-03 DIAGNOSIS — C8339 Diffuse large B-cell lymphoma, extranodal and solid organ sites: Secondary | ICD-10-CM | POA: Diagnosis not present

## 2021-09-03 DIAGNOSIS — Z0000 Encounter for general adult medical examination without abnormal findings: Secondary | ICD-10-CM | POA: Diagnosis not present

## 2021-09-03 DIAGNOSIS — I10 Essential (primary) hypertension: Secondary | ICD-10-CM | POA: Diagnosis not present

## 2021-09-03 DIAGNOSIS — H9190 Unspecified hearing loss, unspecified ear: Secondary | ICD-10-CM | POA: Diagnosis not present

## 2021-09-03 DIAGNOSIS — G4700 Insomnia, unspecified: Secondary | ICD-10-CM | POA: Diagnosis not present

## 2021-09-25 ENCOUNTER — Other Ambulatory Visit: Payer: Self-pay | Admitting: *Deleted

## 2021-09-28 MED ORDER — GABAPENTIN 300 MG PO CAPS: ORAL_CAPSULE | ORAL | 1 refills | Status: DC

## 2021-10-17 ENCOUNTER — Other Ambulatory Visit: Payer: Self-pay | Admitting: Cardiovascular Disease

## 2021-11-30 ENCOUNTER — Other Ambulatory Visit: Payer: Self-pay

## 2021-11-30 ENCOUNTER — Inpatient Hospital Stay
Admission: EM | Admit: 2021-11-30 | Discharge: 2021-12-03 | DRG: 291 | Disposition: A | Discharge status: Home / Self Care | Payer: Medicare HMO | Attending: Obstetrics and Gynecology | Admitting: Obstetrics and Gynecology

## 2021-11-30 ENCOUNTER — Emergency Department: Payer: Medicare HMO

## 2021-11-30 DIAGNOSIS — I110 Hypertensive heart disease with heart failure: Principal | ICD-10-CM | POA: Diagnosis present

## 2021-11-30 DIAGNOSIS — I160 Hypertensive urgency: Secondary | ICD-10-CM | POA: Diagnosis present

## 2021-11-30 DIAGNOSIS — I5043 Acute on chronic combined systolic (congestive) and diastolic (congestive) heart failure: Secondary | ICD-10-CM | POA: Diagnosis present

## 2021-11-30 DIAGNOSIS — Z635 Disruption of family by separation and divorce: Secondary | ICD-10-CM | POA: Diagnosis not present

## 2021-11-30 DIAGNOSIS — J80 Acute respiratory distress syndrome: Secondary | ICD-10-CM | POA: Diagnosis not present

## 2021-11-30 DIAGNOSIS — E785 Hyperlipidemia, unspecified: Secondary | ICD-10-CM | POA: Diagnosis not present

## 2021-11-30 DIAGNOSIS — G629 Polyneuropathy, unspecified: Secondary | ICD-10-CM | POA: Diagnosis not present

## 2021-11-30 DIAGNOSIS — Z66 Do not resuscitate: Secondary | ICD-10-CM | POA: Diagnosis not present

## 2021-11-30 DIAGNOSIS — R0689 Other abnormalities of breathing: Secondary | ICD-10-CM | POA: Diagnosis not present

## 2021-11-30 DIAGNOSIS — M109 Gout, unspecified: Secondary | ICD-10-CM | POA: Diagnosis present

## 2021-11-30 DIAGNOSIS — R531 Weakness: Secondary | ICD-10-CM | POA: Diagnosis not present

## 2021-11-30 DIAGNOSIS — Z20822 Contact with and (suspected) exposure to covid-19: Secondary | ICD-10-CM | POA: Diagnosis not present

## 2021-11-30 DIAGNOSIS — R609 Edema, unspecified: Secondary | ICD-10-CM | POA: Diagnosis not present

## 2021-11-30 DIAGNOSIS — Z87891 Personal history of nicotine dependence: Secondary | ICD-10-CM

## 2021-11-30 DIAGNOSIS — E876 Hypokalemia: Secondary | ICD-10-CM | POA: Diagnosis not present

## 2021-11-30 DIAGNOSIS — R778 Other specified abnormalities of plasma proteins: Secondary | ICD-10-CM | POA: Diagnosis not present

## 2021-11-30 DIAGNOSIS — M7989 Other specified soft tissue disorders: Secondary | ICD-10-CM | POA: Diagnosis not present

## 2021-11-30 DIAGNOSIS — C8518 Unspecified B-cell lymphoma, lymph nodes of multiple sites: Secondary | ICD-10-CM | POA: Diagnosis not present

## 2021-11-30 DIAGNOSIS — R0602 Shortness of breath: Secondary | ICD-10-CM | POA: Diagnosis not present

## 2021-11-30 DIAGNOSIS — Z79899 Other long term (current) drug therapy: Secondary | ICD-10-CM

## 2021-11-30 DIAGNOSIS — I2510 Atherosclerotic heart disease of native coronary artery without angina pectoris: Secondary | ICD-10-CM | POA: Diagnosis not present

## 2021-11-30 DIAGNOSIS — I509 Heart failure, unspecified: Principal | ICD-10-CM

## 2021-11-30 DIAGNOSIS — J90 Pleural effusion, not elsewhere classified: Secondary | ICD-10-CM | POA: Diagnosis not present

## 2021-11-30 DIAGNOSIS — J9601 Acute respiratory failure with hypoxia: Secondary | ICD-10-CM | POA: Diagnosis present

## 2021-11-30 DIAGNOSIS — I5031 Acute diastolic (congestive) heart failure: Secondary | ICD-10-CM | POA: Diagnosis not present

## 2021-11-30 DIAGNOSIS — Z9221 Personal history of antineoplastic chemotherapy: Secondary | ICD-10-CM

## 2021-11-30 DIAGNOSIS — Z807 Family history of other malignant neoplasms of lymphoid, hematopoietic and related tissues: Secondary | ICD-10-CM | POA: Diagnosis not present

## 2021-11-30 DIAGNOSIS — I248 Other forms of acute ischemic heart disease: Secondary | ICD-10-CM | POA: Diagnosis not present

## 2021-11-30 DIAGNOSIS — R0600 Dyspnea, unspecified: Secondary | ICD-10-CM | POA: Diagnosis not present

## 2021-11-30 DIAGNOSIS — I5033 Acute on chronic diastolic (congestive) heart failure: Secondary | ICD-10-CM | POA: Diagnosis present

## 2021-11-30 DIAGNOSIS — Z7982 Long term (current) use of aspirin: Secondary | ICD-10-CM | POA: Diagnosis not present

## 2021-11-30 DIAGNOSIS — J449 Chronic obstructive pulmonary disease, unspecified: Secondary | ICD-10-CM | POA: Diagnosis not present

## 2021-11-30 DIAGNOSIS — I5023 Acute on chronic systolic (congestive) heart failure: Secondary | ICD-10-CM

## 2021-11-30 DIAGNOSIS — Z8249 Family history of ischemic heart disease and other diseases of the circulatory system: Secondary | ICD-10-CM

## 2021-11-30 DIAGNOSIS — I10 Essential (primary) hypertension: Secondary | ICD-10-CM | POA: Diagnosis not present

## 2021-11-30 DIAGNOSIS — H9190 Unspecified hearing loss, unspecified ear: Secondary | ICD-10-CM | POA: Diagnosis present

## 2021-11-30 DIAGNOSIS — I25118 Atherosclerotic heart disease of native coronary artery with other forms of angina pectoris: Secondary | ICD-10-CM | POA: Diagnosis not present

## 2021-11-30 HISTORY — DX: Chronic obstructive pulmonary disease, unspecified: J44.9

## 2021-11-30 HISTORY — DX: Other ill-defined heart diseases: I51.89

## 2021-11-30 LAB — BLOOD GAS, VENOUS
Acid-Base Excess: 4.8 mmol/L — ABNORMAL HIGH (ref 0.0–2.0)
Bicarbonate: 29.9 mmol/L — ABNORMAL HIGH (ref 20.0–28.0)
O2 Saturation: 63.7 %
Patient temperature: 37
pCO2, Ven: 45 mmHg (ref 44–60)
pH, Ven: 7.43 (ref 7.25–7.43)
pO2, Ven: 41 mmHg (ref 32–45)

## 2021-11-30 LAB — CBC WITH DIFFERENTIAL/PLATELET
Abs Immature Granulocytes: 0.04 10*3/uL (ref 0.00–0.07)
Basophils Absolute: 0.1 10*3/uL (ref 0.0–0.1)
Basophils Relative: 1 %
Eosinophils Absolute: 0.1 10*3/uL (ref 0.0–0.5)
Eosinophils Relative: 1 %
HCT: 41 % (ref 39.0–52.0)
Hemoglobin: 13.2 g/dL (ref 13.0–17.0)
Immature Granulocytes: 1 %
Lymphocytes Relative: 15 %
Lymphs Abs: 1.2 10*3/uL (ref 0.7–4.0)
MCH: 29.7 pg (ref 26.0–34.0)
MCHC: 32.2 g/dL (ref 30.0–36.0)
MCV: 92.3 fL (ref 80.0–100.0)
Monocytes Absolute: 0.7 10*3/uL (ref 0.1–1.0)
Monocytes Relative: 9 %
Neutro Abs: 5.8 10*3/uL (ref 1.7–7.7)
Neutrophils Relative %: 73 %
Platelets: 206 10*3/uL (ref 150–400)
RBC: 4.44 MIL/uL (ref 4.22–5.81)
RDW: 12.9 % (ref 11.5–15.5)
WBC: 7.9 10*3/uL (ref 4.0–10.5)
nRBC: 0 % (ref 0.0–0.2)

## 2021-11-30 LAB — COMPREHENSIVE METABOLIC PANEL
ALT: 12 U/L (ref 0–44)
AST: 17 U/L (ref 15–41)
Albumin: 3.6 g/dL (ref 3.5–5.0)
Alkaline Phosphatase: 82 U/L (ref 38–126)
Anion gap: 8 (ref 5–15)
BUN: 18 mg/dL (ref 8–23)
CO2: 26 mmol/L (ref 22–32)
Calcium: 9.1 mg/dL (ref 8.9–10.3)
Chloride: 106 mmol/L (ref 98–111)
Creatinine, Ser: 1.15 mg/dL (ref 0.61–1.24)
GFR, Estimated: >60 mL/min (ref >60)
Glucose, Bld: 102 mg/dL — ABNORMAL HIGH (ref 70–99)
Potassium: 3.5 mmol/L (ref 3.5–5.1)
Sodium: 140 mmol/L (ref 135–145)
Total Bilirubin: 1.1 mg/dL (ref 0.3–1.2)
Total Protein: 7.4 g/dL (ref 6.5–8.1)

## 2021-11-30 LAB — RESP PANEL BY RT-PCR (FLU A&B, COVID) ARPGX2
Influenza A by PCR: NEGATIVE
Influenza B by PCR: NEGATIVE
SARS Coronavirus 2 by RT PCR: NEGATIVE

## 2021-11-30 LAB — TROPONIN I (HIGH SENSITIVITY)
Troponin I (High Sensitivity): 62 ng/L — ABNORMAL HIGH (ref <18)
Troponin I (High Sensitivity): 68 ng/L — ABNORMAL HIGH (ref <18)

## 2021-11-30 LAB — PROCALCITONIN: Procalcitonin: <0.1 ng/mL

## 2021-11-30 LAB — BRAIN NATRIURETIC PEPTIDE: B Natriuretic Peptide: 873.1 pg/mL — ABNORMAL HIGH (ref 0.0–100.0)

## 2021-11-30 MED ORDER — CARVEDILOL 6.25 MG PO TABS
6.2500 mg | ORAL_TABLET | Freq: Two times a day (BID) | ORAL | Status: DC
  Administered 2021-12-01 – 2021-12-02 (×3): 6.25 mg via ORAL
  Filled 2021-11-30 (×3): qty 1

## 2021-11-30 MED ORDER — NITROGLYCERIN 0.4 MG SL SUBL
0.4000 mg | SUBLINGUAL_TABLET | Freq: Once | SUBLINGUAL | Status: AC
  Administered 2021-11-30: 0.4 mg via SUBLINGUAL
  Filled 2021-11-30: qty 1

## 2021-11-30 MED ORDER — ACETAMINOPHEN 650 MG RE SUPP: 650.0000 mg | Freq: Four times a day (QID) | RECTAL | Status: DC | PRN

## 2021-11-30 MED ORDER — ZOLPIDEM TARTRATE 5 MG PO TABS
5.0000 mg | ORAL_TABLET | Freq: Every day | ORAL | Status: DC
  Administered 2021-12-01 – 2021-12-02 (×3): 5 mg via ORAL
  Filled 2021-11-30 (×4): qty 1

## 2021-11-30 MED ORDER — FUROSEMIDE 10 MG/ML IJ SOLN
40.0000 mg | Freq: Once | INTRAMUSCULAR | Status: AC
  Administered 2021-11-30: 40 mg via INTRAVENOUS
  Filled 2021-11-30: qty 4

## 2021-11-30 MED ORDER — MAGNESIUM HYDROXIDE 400 MG/5ML PO SUSP: 30.0000 mL | Freq: Every day | ORAL | Status: DC | PRN

## 2021-11-30 MED ORDER — TRAZODONE HCL 50 MG PO TABS: 25.0000 mg | ORAL_TABLET | Freq: Every evening | ORAL | Status: DC | PRN

## 2021-11-30 MED ORDER — ONDANSETRON HCL 4 MG PO TABS: 4.0000 mg | ORAL_TABLET | Freq: Four times a day (QID) | ORAL | Status: DC | PRN

## 2021-11-30 MED ORDER — LOSARTAN POTASSIUM-HCTZ 100-25 MG PO TABS: 1.0000 | ORAL_TABLET | Freq: Every day | ORAL | Status: DC

## 2021-11-30 MED ORDER — HYDRALAZINE HCL 50 MG PO TABS
100.0000 mg | ORAL_TABLET | Freq: Two times a day (BID) | ORAL | Status: DC
  Administered 2021-12-01 (×3): 100 mg via ORAL
  Filled 2021-11-30 (×3): qty 2

## 2021-11-30 MED ORDER — ONDANSETRON HCL 4 MG/2ML IJ SOLN: 4.0000 mg | Freq: Four times a day (QID) | INTRAMUSCULAR | Status: DC | PRN

## 2021-11-30 MED ORDER — HYDRALAZINE HCL 20 MG/ML IJ SOLN
10.0000 mg | Freq: Four times a day (QID) | INTRAMUSCULAR | Status: DC | PRN
Start: 2021-11-30 — End: 2021-12-03

## 2021-11-30 MED ORDER — ENOXAPARIN SODIUM 40 MG/0.4ML IJ SOSY
40.0000 mg | PREFILLED_SYRINGE | Freq: Every day | INTRAMUSCULAR | Status: DC
  Administered 2021-11-30 – 2021-12-02 (×3): 40 mg via SUBCUTANEOUS
  Filled 2021-11-30 (×3): qty 0.4

## 2021-11-30 MED ORDER — ACETAMINOPHEN 325 MG PO TABS: 650.0000 mg | ORAL_TABLET | Freq: Four times a day (QID) | ORAL | Status: DC | PRN

## 2021-11-30 MED ORDER — PRAVASTATIN SODIUM 40 MG PO TABS
40.0000 mg | ORAL_TABLET | Freq: Every day | ORAL | Status: DC
  Administered 2021-11-30 – 2021-12-02 (×3): 40 mg via ORAL
  Filled 2021-11-30: qty 2
  Filled 2021-11-30 (×2): qty 1

## 2021-11-30 MED ORDER — NITROGLYCERIN 2 % TD OINT
1.0000 [in_us] | TOPICAL_OINTMENT | Freq: Once | TRANSDERMAL | Status: AC
Start: 2021-11-30 — End: 2021-11-30
  Administered 2021-11-30: 1 [in_us] via TOPICAL
  Filled 2021-11-30: qty 1

## 2021-11-30 MED ORDER — DOXAZOSIN MESYLATE 2 MG PO TABS
2.0000 mg | ORAL_TABLET | Freq: Two times a day (BID) | ORAL | Status: DC
  Administered 2021-12-01 – 2021-12-03 (×5): 2 mg via ORAL
  Filled 2021-11-30 (×6): qty 1

## 2021-11-30 MED ORDER — POTASSIUM CHLORIDE CRYS ER 20 MEQ PO TBCR
20.0000 meq | EXTENDED_RELEASE_TABLET | ORAL | Status: DC
  Administered 2021-12-02: 20 meq via ORAL
  Filled 2021-11-30: qty 1

## 2021-11-30 MED ORDER — GABAPENTIN 300 MG PO CAPS
300.0000 mg | ORAL_CAPSULE | Freq: Every day | ORAL | Status: DC
  Administered 2021-11-30 – 2021-12-02 (×3): 300 mg via ORAL
  Filled 2021-11-30 (×3): qty 1

## 2021-11-30 MED ORDER — ASPIRIN EC 81 MG PO TBEC
81.0000 mg | DELAYED_RELEASE_TABLET | Freq: Every day | ORAL | Status: DC
  Administered 2021-12-01 – 2021-12-03 (×3): 81 mg via ORAL
  Filled 2021-11-30 (×3): qty 1

## 2021-11-30 MED ORDER — FUROSEMIDE 10 MG/ML IJ SOLN
40.0000 mg | Freq: Two times a day (BID) | INTRAMUSCULAR | Status: DC
  Administered 2021-12-01 – 2021-12-02 (×3): 40 mg via INTRAVENOUS
  Filled 2021-11-30 (×3): qty 4

## 2021-11-30 NOTE — ED Notes (Signed)
Patient reports no SHOB or difficulty breathing after taking off BIPAP.  Patient maintaining saturations on 3 L [place] ?

## 2021-11-30 NOTE — ED Triage Notes (Signed)
Patient BIB EMS for evaluation of shortness of breath.  No hx of COPD/Asthma.  Symptoms started around noon today.  Given Magnesium 2 grams, Solu-Medrol 125 mg IV, and DuoNeb x 2 PTA  ?

## 2021-11-30 NOTE — ED Notes (Signed)
Patient wanted to wait to take Ambien once he got to room ?

## 2021-11-30 NOTE — ED Notes (Signed)
Patient taken off BIPAP at this time ?

## 2021-11-30 NOTE — H&P (Addendum)
?  ?  ?[place] ? ? ?PATIENT NAME: John Gallegos   ? ?MR#:  [id number] ? ?DATE OF BIRTH:  09-Apr-1946 ? ?DATE OF ADMISSION:  11/30/2021 ? ?PRIMARY CARE PHYSICIAN: Tracie Harrier, MD  ? ?Patient is coming from: Home ? ?REQUESTING/REFERRING PHYSICIAN: Duffy Bruce, MD ? ?CHIEF COMPLAINT:  ? ?Chief Complaint  ?Patient presents with  ? Shortness of Breath  ? ? ?HISTORY OF PRESENT ILLNESS:  ?John Gallegos is a 76 y.o. Caucasian male with medical history significant for gout, hypertension, non-Hodgkin's lymphoma, and hearing loss, who presented to the ER with acute onset of worsening dyspnea over the last 2 to 3 days to get significantly worse today. ? ?ED Course: Upon presenting to the emergency room, BP was 209/122 with respiratory to 26 and temperature 97.4 and oximetry was 97 to 100% on BiPAP that was placed due to respiratory distress.  Labs revealed borderline potassium of 3.5 and a blood glucose of 102 with otherwise unremarkable BMP was 873.1 with high-sensitivity troponin I was 62.  ABG showed pH 7.43 with HCO3 29.9.  CBC was within normal. ? ?EKG as reviewed by me : EKG showed sinus rhythm with a rate of 59 with PACs and prolonged QT interval with QTc of 511 MS with T wave inversion inferiorly. ?Imaging: Chest x-ray showed cardiomegaly with mild interstitial pulmonary edema and small bilateral pleural effusions with superimposed bilateral lower lobe opacities likely atelectasis although left lower lobe prolonged pneumonia is not excluded. ? ?The patient was given 40 mg of IV Lasix, initially Nitropaste and sublingual nitroglycerin.  The patient will be admitted to a cardiac telemetry bed for further evaluation and management. ?PAST MEDICAL HISTORY:  ? ?Past Medical History:  ?Diagnosis Date  ? Gout   ? Hard of hearing   ? Hiatal hernia   ? Hypertension   ? Non Hodgkin's lymphoma ([place]) 04/2017  ? B-cell, chemo tx's and in remission in 2019  ? Urinary incontinence   ? frequency  ? ? ?PAST SURGICAL  HISTORY:  ? ?Past Surgical History:  ?Procedure Laterality Date  ? CYSTOSCOPY W/ RETROGRADES Left 08/19/2017  ? Procedure: CYSTOSCOPY WITH RETROGRADE PYELOGRAM;  Surgeon: Abbie Sons, MD;  Location: [facility];  Service: Urology;  Laterality: Left;  ? CYSTOSCOPY W/ URETERAL STENT REMOVAL Left 08/19/2017  ? Procedure: CYSTOSCOPY WITH STENT REMOVAL;  Surgeon: Abbie Sons, MD;  Location: [facility];  Service: Urology;  Laterality: Left;  ? CYSTOSCOPY WITH BIOPSY Left 05/13/2017  ? Procedure: CYSTOSCOPY WITH URETERAL BIOPSY;  Surgeon: Nickie Retort, MD;  Location: [facility];  Service: Urology;  Laterality: Left;  ? CYSTOSCOPY WITH STENT PLACEMENT Left 05/13/2017  ? Procedure: CYSTOSCOPY WITH STENT PLACEMENT;  Surgeon: Nickie Retort, MD;  Location: [facility];  Service: Urology;  Laterality: Left;  ? PORTA CATH INSERTION N/A 06/14/2017  ? Procedure: PORTA CATH INSERTION;  Surgeon: Algernon Huxley, MD;  Location: [facility];  Service: Cardiovascular;  Laterality: N/A;  ? PORTA CATH REMOVAL N/A 02/25/2020  ? Procedure: PORTA CATH REMOVAL;  Surgeon: Algernon Huxley, MD;  Location: [facility];  Service: Cardiovascular;  Laterality: N/A;  ? 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 right knne surgery    ? needle went through knee  ? TONSILLECTOMY    ? URETEROSCOPY Left 05/13/2017  ? Procedure: URETEROSCOPY;  Surgeon: Nickie Retort, MD;  Location: [facility];  Service: Urology;  Laterality: Left;  ? XI ROBOTIC ASSISTED VENTRAL  HERNIA N/A 05/18/2019  ? Procedure: XI ROBOTIC ASSISTED UMBILICAL HERNIA;  Surgeon: Benjamine Sprague, DO;  Location: [facility];  Service: General;  Laterality: N/A;  ? ? ?SOCIAL HISTORY:  ? ?Social History  ? ?Tobacco Use  ? Smoking status: Former  ?  Packs/day: 0.50  ?  Years: 50.00  ?  Pack years: 25.00  ?  Types: Cigarettes  ?  Quit date: 12/12/2016  ?  Years since quitting: 4.9  ? Smokeless tobacco: Never  ?Substance Use Topics  ? Alcohol use: Yes  ?  Alcohol/week: 2.0 standard drinks  ?   Types: 2 Shots of liquor per week  ?  Comment: vodka and cranberry juice-one drik daily  ? ? ?FAMILY HISTORY:  ? ?Family History  ?Problem Relation Age of Onset  ? Pancreatic cancer Mother   ? Aneurysm Mother   ? Heart attack Mother   ? Lung cancer Father   ? Lymphoma Sister   ? Prostate cancer Neg Hx   ? Kidney cancer Neg Hx   ? Bladder Cancer Neg Hx   ? ? ?DRUG ALLERGIES:  ? ?Allergies  ?Allergen Reactions  ? Amlodipine   ?  Severe leg swelling  ? ? ?REVIEW OF SYSTEMS:  ? ?ROS ?As per history of present illness. All pertinent systems were reviewed above. Constitutional, HEENT, cardiovascular, respiratory, GI, GU, musculoskeletal, neuro, psychiatric, endocrine, integumentary and hematologic systems were reviewed and are otherwise negative/unremarkable except for positive findings mentioned above in the HPI. ? ? ?MEDICATIONS AT HOME:  ? ?Prior to Admission medications   ?Medication Sig Start Date End Date Taking? Authorizing Provider  ?aspirin EC 81 MG tablet Take 81 mg by mouth daily.    [provider]  ?carvedilol (COREG) 6.25 MG tablet Take 1 tablet (6.25 mg total) by mouth 2 (two) times daily with a meal. 08/18/21   Gollan, Kathlene November, MD  ?doxazosin (CARDURA) 2 MG tablet TAKE 1 TABLET(2 MG) BY MOUTH TWICE DAILY 10/19/21   Minna Merritts, MD  ?gabapentin (NEURONTIN) 300 MG capsule TAKE 1 CAPSULE(300 MG) BY MOUTH AT BEDTIME 09/28/21   Earlie Server, MD  ?hydrALAZINE (APRESOLINE) 100 MG tablet Take 100 mg by mouth 2 (two) times daily.    [provider]  ?losartan-hydrochlorothiazide (HYZAAR) 100-25 MG tablet Take 1 tablet by mouth daily. 12/23/20 12/23/21  [provider]  ?meloxicam (MOBIC) 7.5 MG tablet Take 7.5 mg by mouth daily as needed. 06/14/21   [provider]  ?potassium chloride SA (KLOR-CON) 20 MEQ tablet Monday, Wednesday and Friday.    [provider]  ?pravastatin (PRAVACHOL) 40 MG tablet Take 1 tablet (40 mg total) by mouth at bedtime. 08/18/21   Minna Merritts, MD  ?zolpidem (AMBIEN) 10 MG tablet Take 10 mg by mouth at bedtime. 02/10/17   [provider]  ? ?  ? ?VITAL SIGNS:  ?Blood pressure (!) 153/100, pulse 65, temperature 98.6 ?F (37 ?C), temperature source Oral, resp. rate 20, height '6\' 1"'$  (1.854 m), weight 102.1 kg, SpO2 95 %. ? ?PHYSICAL EXAMINATION:  ?Physical Exam ? ?GENERAL:  76 y.o.-year-old Caucasian male patient lying in the bed with no acute distress.  ?EYES: Pupils equal, round, reactive to light and accommodation. No scleral icterus. Extraocular muscles intact.  ?HEENT: Head atraumatic, normocephalic. Oropharynx and nasopharynx clear.  ?NECK:  Supple, no jugular venous distention. No thyroid enlargement, no tenderness.  ?LUNGS: Diminished bibasilar breath sounds with bibasilar rales.  No use of accessory muscles of respiration.  ?CARDIOVASCULAR: Regular  rate and rhythm, S1, S2 normal. No murmurs, rubs, or gallops.  ?ABDOMEN: Soft, nondistended, nontender. Bowel sounds present. No organomegaly or mass.  ?EXTREMITIES: 2+ bilateral lower extremity pitting edema with cyanosis, or clubbing.  ?NEUROLOGIC: Cranial nerves II through XII are intact. Muscle strength 5/5 in all extremities. Sensation intact. Gait not checked.  ?PSYCHIATRIC: The patient is alert and oriented x 3.  Normal affect and good eye contact. ?SKIN: No obvious rash, lesion, or ulcer.  ? ?LABORATORY PANEL:  ? ?CBC ?Recent Labs  ?Lab 11/30/21 ?2019  ?WBC 7.9  ?HGB 13.2  ?HCT 41.0  ?PLT 206  ? ?------------------------------------------------------------------------------------------------------------------ ? ?Chemistries  ?Recent Labs  ?Lab 11/30/21 ?2019  ?NA 140  ?K 3.5  ?CL 106  ?CO2 26  ?GLUCOSE 102*  ?BUN 18  ?CREATININE 1.15  ?CALCIUM 9.1  ?AST 17  ?ALT 12  ?ALKPHOS 82  ?BILITOT 1.1  ? ?------------------------------------------------------------------------------------------------------------------ ? ?Cardiac Enzymes ?No results for input(s): TROPONINI in the last 168  hours. ?------------------------------------------------------------------------------------------------------------------ ? ?RADIOLOGY:  ?DG Chest Portable 1 View ? ?Result Date: 11/30/2021 ?CLINICAL DATA:  [place]

## 2021-11-30 NOTE — ED Provider Notes (Signed)
? ?[facility] ?Provider Note ? ? ? Event Date/Time  ? First MD Initiated Contact with Patient 11/30/21 2011   ?  (approximate) ? ? ?History  ? ?Shortness of Breath ? ? ?HPI ? ?John Gallegos is a 76 y.o. male with past medical history hypertension, hyperlipidemia, coronary disease, prior lymphoma, here with acute shortness of breath.  The patient states that for the last 2 to 3 days, he has felt mildly more short of breath, did not necessarily significantly.  He had an episode yesterday in which she fairly acutely got short of breath, felt like he could not breathe, but this resolved over several minutes and he was actually able to go to a gathering afterwards.  He states that just prior to arrival, he began to feel more short of breath and then acutely, severely winded.  Feels like he cannot get enough air.  He states that he has never had symptoms similar to this in the past.  Denies any recent medication changes.  Denies any recent fevers.  No sputum production.  No known COVID or other sick exposures. ?  ? ? ?Physical Exam  ? ?Triage Vital Signs: ?ED Triage Vitals  ?Enc Vitals Group  ?   BP 11/30/21 2011 (!) 209/122  ?   Pulse Rate 11/30/21 2011 80  ?   Resp 11/30/21 2011 (!) 26  ?   Temp 11/30/21 2011 (!) 97.4 ?F (36.3 ?C)  ?   Temp Source 11/30/21 2011 Axillary  ?   SpO2 11/30/21 2006 91 %  ?   Weight 11/30/21 2015 225 lb (102.1 kg)  ?   Height 11/30/21 2015 '6\' 1"'$  (1.854 m)  ?   Head Circumference --   ?   Peak Flow --   ?   Pain Score 11/30/21 2015 0  ?   Pain Loc --   ?   Pain Edu? --   ?   Excl. in [place]? --   ? ? ?Most recent vital signs: ?Vitals:  ? 12/01/21 0800 12/01/21 0828  ?BP:  (!) 158/91  ?Pulse:  71  ?Resp:  (!) 22  ?Temp:  98.7 ?F (37.1 ?C)  ?SpO2: 94% 97%  ? ? ? ?General: Awake, mild respiratory distress, sitting upright. ?CV:  Good peripheral perfusion.  Regular rate and rhythm. ?Resp:  Sitting upright, leaning forward, increased work of breathing with suprasternal  retractions.  Speaking in 2-3 word sentences.  Breath sounds with diffuse wheezing and diminished aeration.  Rales bilaterally ?Abd:  No distention.  ?Other:  1+ edema bilateral lower extremities. ? ? ?ED Results / Procedures / Treatments  ? ?Labs ?(all labs ordered are listed, but only abnormal results are displayed) ?Labs Reviewed  ?COMPREHENSIVE METABOLIC PANEL - Abnormal; Notable for the following components:  ?    Result Value  ? Glucose, Bld 102 (*)   ? All other components within normal limits  ?BRAIN NATRIURETIC PEPTIDE - Abnormal; Notable for the following components:  ? B Natriuretic Peptide 873.1 (*)   ? All other components within normal limits  ?BLOOD GAS, VENOUS - Abnormal; Notable for the following components:  ? Bicarbonate 29.9 (*)   ? Acid-Base Excess 4.8 (*)   ? All other components within normal limits  ?BASIC METABOLIC PANEL - Abnormal; Notable for the following components:  ? Potassium 3.2 (*)   ? Glucose, Bld 143 (*)   ? Calcium 8.6 (*)   ? All other components within normal limits  ?GLUCOSE, CAPILLARY -  Abnormal; Notable for the following components:  ? Glucose-Capillary 126 (*)   ? All other components within normal limits  ?TROPONIN I (HIGH SENSITIVITY) - Abnormal; Notable for the following components:  ? Troponin I (High Sensitivity) 62 (*)   ? All other components within normal limits  ?TROPONIN I (HIGH SENSITIVITY) - Abnormal; Notable for the following components:  ? Troponin I (High Sensitivity) 68 (*)   ? All other components within normal limits  ?RESP PANEL BY RT-PCR (FLU A&B, COVID) ARPGX2  ?CBC WITH DIFFERENTIAL/PLATELET  ?PROCALCITONIN  ?CBC  ? ? ? ?EKG ?Normal sinus rhythm, ventricular rate 59.  PR 138, QRS 89, QTc 511.  Prolonged QT, otherwise unremarkable. ? ? ?RADIOLOGY ?Chest x-ray: Cardiomegaly with significant interstitial edema and small bilateral effusions ? ? ?I also independently reviewed and agree wit radiologist interpretations. ? ? ?PROCEDURES: ? ?Critical Care  performed: Yes, see critical care procedure note(s) ? ?.Critical Care ?Performed by: Duffy Bruce, MD ?Authorized by: Duffy Bruce, MD  ? ?Critical care provider statement:  ?  Critical care time (minutes):  40 ?  Critical care time was exclusive of:  Separately billable procedures and treating other patients ?  Critical care was necessary to treat or prevent imminent or life-threatening deterioration of the following conditions:  Cardiac failure, circulatory failure and respiratory failure ?  Critical care was time spent personally by me on the following activities:  Development of treatment plan with patient or surrogate, discussions with consultants, evaluation of patient's response to treatment, examination of patient, ordering and review of laboratory studies, ordering and review of radiographic studies, ordering and performing treatments and interventions, pulse oximetry, re-evaluation of patient's condition and review of old charts ? ? ? ?MEDICATIONS ORDERED IN ED: ?Medications  ?aspirin EC tablet 81 mg (81 mg Oral Given 12/01/21 0913)  ?carvedilol (COREG) tablet 6.25 mg (6.25 mg Oral Given 12/01/21 0913)  ?doxazosin (CARDURA) tablet 2 mg (2 mg Oral Given 12/01/21 0913)  ?hydrALAZINE (APRESOLINE) tablet 100 mg (100 mg Oral Given 12/01/21 0913)  ?pravastatin (PRAVACHOL) tablet 40 mg (40 mg Oral Given 11/30/21 2310)  ?zolpidem (AMBIEN) tablet 5 mg (5 mg Oral Given 12/01/21 0031)  ?gabapentin (NEURONTIN) capsule 300 mg (300 mg Oral Given 11/30/21 2310)  ?potassium chloride SA (KLOR-CON M) CR tablet 20 mEq (has no administration in time range)  ?enoxaparin (LOVENOX) injection 40 mg (40 mg Subcutaneous Given 11/30/21 2311)  ?furosemide (LASIX) injection 40 mg (40 mg Intravenous Given 12/01/21 0914)  ?acetaminophen (TYLENOL) tablet 650 mg (has no administration in time range)  ?  Or  ?acetaminophen (TYLENOL) suppository 650 mg (has no administration in time range)  ?traZODone (DESYREL) tablet 25 mg (has no  administration in time range)  ?magnesium hydroxide (MILK OF MAGNESIA) suspension 30 mL (has no administration in time range)  ?ondansetron (ZOFRAN) tablet 4 mg (has no administration in time range)  ?  Or  ?ondansetron (ZOFRAN) injection 4 mg (has no administration in time range)  ?hydrALAZINE (APRESOLINE) injection 10 mg (has no administration in time range)  ?losartan (COZAAR) tablet 100 mg (100 mg Oral Given 12/01/21 0913)  ?potassium chloride SA (KLOR-CON M) CR tablet 40 mEq (has no administration in time range)  ?nitroGLYCERIN (NITROSTAT) SL tablet 0.4 mg (0.4 mg Sublingual Given 11/30/21 2135)  ?nitroGLYCERIN (NITROGLYN) 2 % ointment 1 inch (1 inch Topical Given 11/30/21 2133)  ?furosemide (LASIX) injection 40 mg (40 mg Intravenous Given 11/30/21 2131)  ? ? ? ?IMPRESSION / MDM / ASSESSMENT AND PLAN / ED COURSE  ?  I reviewed the triage vital signs and the nursing notes. ?             ?               ? ? ?The patient is on the cardiac monitor to evaluate for evidence of arrhythmia and/or significant heart rate changes. ? ? ?Ddx:  ?CHF exacerbation, COPD exacerbation, PNA, COVID-19, PE, ACS ? ? ?MDM:  ?76 yo M with PMHx HTN, CAD, ?COPD here with acute SOB. Pt with marked dyspnea on arrival. Placed on BIPAP with improve. Clinically, suspect acute CHF exacerbation. Pt has known CAD but I do not see CHF in his record. CXR obtained, reviewed, shows pulm edema b/l. He is hypertensive. Pt given IV lasix, nitroglycerin, BIPAP, and will admit. No fevers, infectious sx. Labs show no leukocytosis or anemia. CMP unremarkable - normal renal function. Trop, BNP both elevated c/w CHF and likely demand. VBG suggest no hypercapnea, suspect it is mostly CHF. Procal is negative. COVID is negative. ? ?Will admit to medicine for new onset acute CHF with flash edema. Pt updated and in agreement. Hospitalist consulted for admission. ? ? ?MEDICATIONS GIVEN IN ED: ?Medications  ?aspirin EC tablet 81 mg (81 mg Oral Given 12/01/21 0913)   ?carvedilol (COREG) tablet 6.25 mg (6.25 mg Oral Given 12/01/21 0913)  ?doxazosin (CARDURA) tablet 2 mg (2 mg Oral Given 12/01/21 0913)  ?hydrALAZINE (APRESOLINE) tablet 100 mg (100 mg Oral Given 12/01/21 0913)  ?pravastatin (PR

## 2021-11-30 NOTE — ED Notes (Signed)
Patient reports improvement in symptoms

## 2021-12-01 ENCOUNTER — Inpatient Hospital Stay: Payer: Medicare HMO

## 2021-12-01 ENCOUNTER — Encounter: Payer: Self-pay | Admitting: Family Medicine

## 2021-12-01 ENCOUNTER — Inpatient Hospital Stay (HOSPITAL_COMMUNITY)
Admit: 2021-12-01 | Discharge: 2021-12-01 | Disposition: A | Discharge status: Home / Self Care | Payer: Medicare HMO | Attending: Family Medicine | Admitting: Family Medicine

## 2021-12-01 DIAGNOSIS — I160 Hypertensive urgency: Secondary | ICD-10-CM | POA: Diagnosis not present

## 2021-12-01 DIAGNOSIS — I5031 Acute diastolic (congestive) heart failure: Secondary | ICD-10-CM

## 2021-12-01 DIAGNOSIS — G629 Polyneuropathy, unspecified: Secondary | ICD-10-CM | POA: Diagnosis not present

## 2021-12-01 DIAGNOSIS — I5033 Acute on chronic diastolic (congestive) heart failure: Secondary | ICD-10-CM

## 2021-12-01 DIAGNOSIS — I2510 Atherosclerotic heart disease of native coronary artery without angina pectoris: Secondary | ICD-10-CM

## 2021-12-01 DIAGNOSIS — R778 Other specified abnormalities of plasma proteins: Secondary | ICD-10-CM

## 2021-12-01 DIAGNOSIS — E785 Hyperlipidemia, unspecified: Secondary | ICD-10-CM

## 2021-12-01 LAB — BASIC METABOLIC PANEL
Anion gap: 9 (ref 5–15)
BUN: 19 mg/dL (ref 8–23)
CO2: 23 mmol/L (ref 22–32)
Calcium: 8.6 mg/dL — ABNORMAL LOW (ref 8.9–10.3)
Chloride: 106 mmol/L (ref 98–111)
Creatinine, Ser: 0.99 mg/dL (ref 0.61–1.24)
GFR, Estimated: >60 mL/min (ref >60)
Glucose, Bld: 143 mg/dL — ABNORMAL HIGH (ref 70–99)
Potassium: 3.2 mmol/L — ABNORMAL LOW (ref 3.5–5.1)
Sodium: 138 mmol/L (ref 135–145)

## 2021-12-01 LAB — CBC
HCT: 39.3 % (ref 39.0–52.0)
Hemoglobin: 13 g/dL (ref 13.0–17.0)
MCH: 30 pg (ref 26.0–34.0)
MCHC: 33.1 g/dL (ref 30.0–36.0)
MCV: 90.6 fL (ref 80.0–100.0)
Platelets: 200 10*3/uL (ref 150–400)
RBC: 4.34 MIL/uL (ref 4.22–5.81)
RDW: 12.9 % (ref 11.5–15.5)
WBC: 6.6 10*3/uL (ref 4.0–10.5)
nRBC: 0 % (ref 0.0–0.2)

## 2021-12-01 LAB — GLUCOSE, CAPILLARY: Glucose-Capillary: 126 mg/dL — ABNORMAL HIGH (ref 70–99)

## 2021-12-01 LAB — ECHOCARDIOGRAM COMPLETE
AR max vel: 3.65 cm2
AV Area VTI: 5.02 cm2
AV Area mean vel: 3.23 cm2
AV Mean grad: 1.5 mmHg
AV Peak grad: 2.7 mmHg
Ao pk vel: 0.82 m/s
Area-P 1/2: 1.84 cm2
Height: 73 in
MV VTI: 2.69 cm2
S' Lateral: 3.2 cm
Weight: 3668.45 oz

## 2021-12-01 LAB — MAGNESIUM: Magnesium: 2.1 mg/dL (ref 1.7–2.4)

## 2021-12-01 MED ORDER — POTASSIUM CHLORIDE CRYS ER 20 MEQ PO TBCR
40.0000 meq | EXTENDED_RELEASE_TABLET | Freq: Once | ORAL | Status: AC
  Administered 2021-12-01: 40 meq via ORAL
  Filled 2021-12-01: qty 2

## 2021-12-01 MED ORDER — SPIRONOLACTONE 25 MG PO TABS
25.0000 mg | ORAL_TABLET | Freq: Every day | ORAL | Status: DC
  Administered 2021-12-01 – 2021-12-03 (×3): 25 mg via ORAL
  Filled 2021-12-01 (×3): qty 1

## 2021-12-01 MED ORDER — LOSARTAN POTASSIUM 50 MG PO TABS
100.0000 mg | ORAL_TABLET | Freq: Every day | ORAL | Status: DC
Start: 2021-12-01 — End: 2021-12-03
  Administered 2021-12-01 – 2021-12-03 (×3): 100 mg via ORAL
  Filled 2021-12-01 (×3): qty 2

## 2021-12-01 MED ORDER — HYDROCHLOROTHIAZIDE 25 MG PO TABS: 25.0000 mg | ORAL_TABLET | Freq: Every day | ORAL | Status: DC

## 2021-12-01 NOTE — Assessment & Plan Note (Addendum)
Continue aspirin & stain, Coreg, losartan ?

## 2021-12-01 NOTE — Assessment & Plan Note (Addendum)
In the setting of hypertensive urgency. ?Echo 4/25: EF 60-65%, severe asymmetric LVH, grade 2 diastolic dysfunction. ?--Cardiology following ?--On IV Lasix 40 mg BID ?--Coreg, doxazosin, hydralazine, losartan ?--Hold HCTZ on Lasix ?--Aldactone added ?--Consider SGLT2 inhibitor ?

## 2021-12-01 NOTE — Assessment & Plan Note (Addendum)
POA, resulting in acute decompensated HFpEF ?PRN IV hydralazine ?Resumed on home regimen ?Aldactone added ?

## 2021-12-01 NOTE — Consult Note (Signed)
? ?Cardiology Consult  ?  ?Patient ID: John Gallegos ?MRN: [id number], DOB/AGE: [birth year]  ? ?Admit date: 11/30/2021 ?Date of Consult: 12/01/2021 ? ?Primary Physician: Tracie Harrier, MD ?Primary Cardiologist: Ida Rogue, MD ?Requesting Provider: Darlyne Russian, DO ? ?Patient Profile  ?  ?John Gallegos is a 76 y.o. male with a history of HTN, diastolic dysfunction, non-Hodgkin's lymphoma status post chemotherapy, remote tobacco abuse, and gout, who is being seen today for the evaluation of hypertensive urgency and heart failure at the request of Dr. Arbutus Ped. ? ?Past Medical History  ? ?Past Medical History:  ?Diagnosis Date  ? Diastolic dysfunction   ? a. 09/2017 Echo: EF 50-55%, mild RM, PASP 80mHg.  ? Gout   ? Hard of hearing   ? Hiatal hernia   ? Hypertension   ? Non Hodgkin's lymphoma ([place] 04/2017  ? B-cell, chemo tx's and in remission in 2019  ? Urinary incontinence   ? frequency  ?  ?Past Surgical History:  ?Procedure Laterality Date  ? CYSTOSCOPY W/ RETROGRADES Left 08/19/2017  ? Procedure: CYSTOSCOPY WITH RETROGRADE PYELOGRAM;  Surgeon: SAbbie Sons MD;  Location: [facility];  Service: Urology;  Laterality: Left;  ? CYSTOSCOPY W/ URETERAL STENT REMOVAL Left 08/19/2017  ? Procedure: CYSTOSCOPY WITH STENT REMOVAL;  Surgeon: SAbbie Sons MD;  Location: [facility];  Service: Urology;  Laterality: Left;  ? CYSTOSCOPY WITH BIOPSY Left 05/13/2017  ? Procedure: CYSTOSCOPY WITH URETERAL BIOPSY;  Surgeon: BNickie Retort MD;  Location: [facility];  Service: Urology;  Laterality: Left;  ? CYSTOSCOPY WITH STENT PLACEMENT Left 05/13/2017  ? Procedure: CYSTOSCOPY WITH STENT PLACEMENT;  Surgeon: BNickie Retort MD;  Location: [facility];  Service: Urology;  Laterality: Left;  ? PORTA CATH INSERTION N/A 06/14/2017  ? Procedure: PORTA CATH INSERTION;  Surgeon: DAlgernon Huxley MD;  [facility];  Service: Cardiovascular;  Laterality: N/A;  ? PORTA CATH REMOVAL N/A 02/25/2020  ? Procedure:  PORTA CATH REMOVAL;  Surgeon: DAlgernon Huxley MD;  [facility];  Service: Cardiovascular;  Laterality: N/A;  ? 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 right knne surgery    ? needle went through knee  ? TONSILLECTOMY    ? URETEROSCOPY Left 05/13/2017  ? Procedure: URETEROSCOPY;  Surgeon: BNickie Retort MD;  Location: [facility];  Service: Urology;  Laterality: Left;  ? XI ROBOTIC ASSISTED VENTRAL HERNIA N/A 05/18/2019  ? Procedure: XI ROBOTIC ASSISTED UMBILICAL HERNIA;  Surgeon: SBenjamine Sprague DO;  Location: [facility];  Service: General;  Laterality: N/A;  ?  ? ?Allergies ? ?Allergies  ?Allergen Reactions  ? Amlodipine   ?  Severe leg swelling  ? ? ?History of Present Illness  ?  ?76year old male with the above past medical history including HTN, diastolic dysfunction, non-Hodgkin's lymphoma status post chemotherapy, remote tobacco abuse, and gout.  He has also been noted to have significant three-vessel coronary calcium on CT scan.  Prior echocardiogram in February 2019 showed an EF of 50 to 55% with elevated PASP at 37 mmHg.  Hypertension has been managed with carvedilol, doxazosin, hydralazine, and losartan HCTZ in the outpatient setting.  He was last seen by Dr. GRockey Situin January 2023, at which time he was doing well. ? ?Over the past month, he has been experiencing progressive DOE and R>L lower ext edema.  Dyspnea was more noticeable w/ minimal activity on 4/23, and then on 4/24, he noted profound dyspnea and wheezing  throughoug the day.  He forgot to take his medications on April 24.  He denies palpitations, pnd, orthopnea, or chest pain.  Due to ongoing dyspnea and what he describes as loud wheezing, he called EMS around 19:30 on April 24, and was found to be hypoxic and hypertensive.  He was placed on BiPAP and taken to the ED where his BP was 209/122.  ECG showed sinus bradycardia at 59 with inferior T wave inversion and prolonged QTc at 511 ms.  Potassium was 3.5 on arrival.  BNP was elevated  at 873.1.  Troponin elevated at 62  68.  Chest x-ray showed cardiomegaly with mild interstitial edema and small bilateral pleural effusions.  He was treated with intravenous Lasix and nitroglycerin paste.  He was subsequently admitted and home medications resumed.  This morning, his breathing is much improved, though not quite at baseline.  Blood pressure has been trending in the 150s this morning.  Echocardiogram is pending. ? ?Inpatient Medications  ?  ? aspirin EC  81 mg Oral Daily  ? carvedilol  6.25 mg Oral BID WC  ? doxazosin  2 mg Oral BID  ? enoxaparin (LOVENOX) injection  40 mg Subcutaneous QHS  ? furosemide  40 mg Intravenous Q12H  ? gabapentin  300 mg Oral QHS  ? hydrALAZINE  100 mg Oral BID  ? losartan  100 mg Oral Daily  ? [START ON 12/02/2021] potassium chloride SA  20 mEq Oral Q M,W,F  ? pravastatin  40 mg Oral QHS  ? zolpidem  5 mg Oral QHS  ? ? ?Family History  ?  ?Family History  ?Problem Relation Age of Onset  ? Pancreatic cancer Mother   ?     died @ 38  ? Aneurysm Mother   ? Heart attack Mother   ? Lung cancer Father   ? Lymphoma Sister   ? Prostate cancer Neg Hx   ? Kidney cancer Neg Hx   ? Bladder Cancer Neg Hx   ? ?He indicated that his mother is deceased. He indicated that his father is deceased. He indicated that both of his sisters are alive. He indicated that both of his brothers are alive. He indicated that the status of his neg hx is unknown. ? ? ?Social History  ?  ?Social History  ? ?Socioeconomic History  ? Marital status: Legally Separated  ?  Spouse name: Not on file  ? Number of children: Not on file  ? Years of education: Not on file  ? Highest education level: Not on file  ?Occupational History  ? Not on file  ?Tobacco Use  ? Smoking status: Former  ?  Packs/day: 0.50  ?  Years: 50.00  ?  Pack years: 25.00  ?  Types: Cigarettes  ?  Quit date: 12/12/2016  ?  Years since quitting: 4.9  ? Smokeless tobacco: Never  ?Vaping Use  ? Vaping Use: Never used  ?Substance and Sexual Activity   ? Alcohol use: Yes  ?  Alcohol/week: 2.0 standard drinks  ?  Types: 2 Shots of liquor per week  ?  Comment: vodka and cranberry juice-one drink daily (10 oz glass)  ? Drug use: No  ? Sexual activity: Not on file  ?Other Topics Concern  ? Not on file  ?Social History Narrative  ? Lives locally in apartment.  Divorced but he and his ex-wife are close friends and they live in the same apt complex.  He does not routinely exercise.  ? ?  Social Determinants of Health  ? ?Financial Resource Strain: Not on file  ?Food Insecurity: Not on file  ?Transportation Needs: Not on file  ?Physical Activity: Not on file  ?Stress: Not on file  ?Social Connections: Not on file  ?Intimate Partner Violence: Not on file  ?  ? ?Review of Systems  ?  ?General:  No chills, fever, night sweats or weight changes.  ?Cardiovascular:  No chest pain, +++ dyspnea on exertion, +++ right greater than left lower extremity edema, no orthopnea, palpitations, paroxysmal nocturnal dyspnea. ?Dermatological: No rash, lesions/masses ?Respiratory: No cough, +++ dyspnea ?Urologic: No hematuria, dysuria ?Abdominal:   No nausea, vomiting, diarrhea, bright red blood per rectum, melena, or hematemesis ?Neurologic:  No visual changes, wkns, changes in mental status. ?All other systems reviewed and are otherwise negative except as noted above. ? ?Physical Exam  ?  ?Blood pressure 126/76, pulse 72, temperature 97.9 ?F (36.6 ?C), temperature source Oral, resp. rate 15, height '6\' 1"'$  (1.854 m), weight 104 kg, SpO2 95 %.  ?General: Pleasant, NAD ?Psych: Normal affect. ?Neuro: Alert and oriented X 3. Moves all extremities spontaneously. ?HEENT: Normal  ?Neck: Supple without bruits or JVD. ?Lungs:  Resp regular and unlabored, coarse breath sounds throughout. ?Heart: RRR no s3, s4, or murmurs. ?Abdomen: Soft, non-tender, non-distended, BS + x 4.  ?Extremities: No clubbing, cyanosis.  1+ bilateral lower extremity edema. DP/PT2+, Radials 2+ and equal bilaterally. ? ?Labs  ?   ?Cardiac Enzymes ?Recent Labs  ?Lab 11/30/21 ?2019 11/30/21 ?2314  ?TROPONINIHS 62* 68*  ?   ?BNP ?   ?Component Value Date/Time  ? BNP 873.1 (H) 11/30/2021 2019  ? ?Lab Results  ?Component Value Date

## 2021-12-01 NOTE — Hospital Course (Signed)
John Gallegos is a 76 y.o. male with medical history significant for gout, hypertension, non-Hodgkin's lymphoma, and hearing loss, who presented to the ED on evening of 11/30/2021 with worsening dyspnea over 2 to 3 days Got significantly worse. ? ?ED course --BP severely elevated 209/122, RR 26 with respiratory distress, placed on BiPAP.  Afebrile and O2 sats were stable.  BNP was elevated 873.1, at hs-troponin 62.  Chest x-ray showed cardiomegaly with mild interstitial pulmonary edema and small bilateral pleural effusions with superimposed bilateral lower lobe atelectasis (versus infiltrate). ? ?Patient was started on IV diuresis, treated with Nitropaste and sublingual nitro and admitted to the hospitalist service with cardiology consulted. ? ? ?

## 2021-12-01 NOTE — Progress Notes (Signed)
*  PRELIMINARY RESULTS* ?Echocardiogram ?2D Echocardiogram has been performed. ? ?Cerinity Zynda, Sonia Side ?12/01/2021, 8:52 AM ?

## 2021-12-01 NOTE — Progress Notes (Signed)
?  Progress Note ? ? ?Patient: John Gallegos VQM:[id number] DOB: 10-30-1945 DOA: 11/30/2021     1 ?DOS: the patient was seen and examined on 12/01/2021 ?  ?Brief hospital course: ?John Gallegos is a 76 y.o. male with medical history significant for gout, hypertension, non-Hodgkin's lymphoma, and hearing loss, who presented to the ED on evening of 11/30/2021 with worsening dyspnea over 2 to 3 days Got significantly worse. ? ?ED course --BP severely elevated 209/122, RR 26 with respiratory distress, placed on BiPAP.  Afebrile and O2 sats were stable.  BNP was elevated 873.1, at hs-troponin 62.  Chest x-ray showed cardiomegaly with mild interstitial pulmonary edema and small bilateral pleural effusions with superimposed bilateral lower lobe atelectasis (versus infiltrate). ? ?Patient was started on IV diuresis, treated with Nitropaste and sublingual nitro and admitted to the hospitalist service with cardiology consulted. ? ? ? ?Assessment and Plan: ?* Acute on chronic diastolic CHF (congestive heart failure) ([place]) ?In the setting of hypertensive urgency. ?Echo 4/25: EF 60-65%, severe asymmetric LVH, grade 2 diastolic dysfunction. ?--Cardiology following ?--On IV Lasix 40 mg BID ?--Coreg, doxazosin, hydralazine, losartan ?--Hold HCTZ on Lasix ?--Aldactone added ?--Consider SGLT2 inhibitor ? ?Hypertensive urgency ?POA, resulting in acute decompensated HFpEF ?PRN IV hydralazine ?Resumed on home regimen ?Aldactone added ? ?Coronary artery disease ?Continue aspirin & stain, Coreg, losartan ? ?Peripheral neuropathy ?On Neurontin ? ?Dyslipidemia ?Continue statin  ? ? ? ? ?  ? ?Subjective: Patient awake sitting up in bed when seen today.  Reports high urine output with diuresis.  Very concerned about the swelling in his right leg which is chronic and waxes and wanes.  He says his circulation has been looked at before but no interventions were needed.  Denies chest pain, palpitations, shortness of breath and other acute  complaints at this time. ? ?Physical Exam: ?Vitals:  ? 12/01/21 0348 12/01/21 0500 12/01/21 0800 12/01/21 0828  ?BP: 126/76   (!) 158/91  ?Pulse: 72   71  ?Resp: 15   (!) 22  ?Temp: 97.9 ?F (36.6 ?C)   98.7 ?F (37.1 ?C)  ?TempSrc: Oral     ?SpO2: 95%  94% 97%  ?Weight:  104 kg    ?Height:      ? ?General exam: awake, alert, no acute distress ?HEENT: oist mucus membranes, hearing grossly normal  ?Respiratory system: CTAB, no wheezes, normal respiratory effort. ?Cardiovascular system: normal S1/S2, RRR, right greater than left lower extremity pretibial edema   ?Gastrointestinal system: soft, NT ?Central nervous system: A&O x3. no gross focal neurologic deficits, normal speech ?Extremities: moves all, no cyanosis, normal tone ?Skin: dry, intact, normal temperature ?Psychiatry: normal mood, congruent affect, judgement and insight appear normal ? ? ?Data Reviewed: ? ?Notable labs: K3.2, glucose 143, calcium 8.6 ? ?Right lower extremity Doppler ultrasound negative for DVT. ? ?Echocardiogram today with LVEF 60 to 65%, severe asymmetric left ventricular hypertrophy of the septal segment, grade 2 diastolic dysfunction, no significant valvular disease ? ?Family Communication: None ? ?Disposition: ?Status is: Inpatient ?Remains inpatient appropriate because: Remains on IV diuresis still volume overloaded.  Discharge pending clearance by cardiology ? ? Planned Discharge Destination: Home ? ? ? ?Time spent: 35 minutes ? ?Author: ?Ezekiel Slocumb, DO ?12/01/2021 5:44 PM ? ?For on call review [email or web address].  ?

## 2021-12-01 NOTE — Assessment & Plan Note (Addendum)
Continue statin. 

## 2021-12-01 NOTE — Assessment & Plan Note (Addendum)
On Neurontin ?

## 2021-12-01 NOTE — Consult Note (Signed)
? ?  Heart Failure Nurse Navigator Note ? ?HFpEF 60 to 65%.  Severe asymmetrical left ventricular hypertrophy of the septal segment.  Ventricular diastolic parameters consistent with grade 2 diastolic dysfunction mild biatrial enlargement. ? ?Presented to the emergency room with complaints of worsening dyspnea and lower extremity edema over the last 2 to 3 days..  Pressure was noted to be 209/122.  Forgot to take his medication the day of admission. ? ?Comorbidities: ? ?Gout ?Hard of hearing ?Hypertension ?Non-Hodgkin's lymphoma ? ?Medications: ? ?Aspirin 81 mg daily carvedilol 6.25 mg 2 times a day with meals ?Doxazosin 2 mg 2 times a day  ?furosemide 40 mg IV every 12 hours ?Hydralazine 100 mg 2 times a day ?Losartan 100 mg daily ?Hydrochlorothiazide 25 mg daily ?Pravastatin 40 mg at bedtime ?Spironolactone 25 mg daily ? ?Labs: ? ?BNP 871, sodium 138, potassium 3.2, chloride 106, CO2 23, BUN 19, creatinine 0.99, GFR greater than 60 ?Weight is 104 kg ?Blood pressure 158/91 ? ? ? ?Initial meeting with patient today, he is lying in bed in no acute distress. ? ?He states that he lives by himself in an apartment, he and his wife are currently separated.  He has grown sons. ? ?He states that he likes to cook and he does his own cooking.  He states that he does not use salt at the table but he does add when fixing pasta.  He states once a month that he may get a small pizza also likes to order [place].  Made aware that these food items are higher in sodium content. Daily limit of 2000 mg daily. ? ?Also went over fluid restriction of 64 ounces daily and what constitutes a liquid.  He states he drinks 2-10 ounce glasses of water, ice tea and drinks a mixed drink of 10 ounces of vodka and cranberry juice daily. ? ?He states he does not have a scale, states he is weighed at his doctors office every three months.  Explained the reasoning of daily weights and what to report to keep himself healthy,feeling good  and out of the  hospital. ? ?Discussed remaining active, he states he gets out for an hour each day. ? ?Also is discussed follow up in the Heart failure clinic and made aware he qualifies for [facility] program, given a flyer. ? ?He has an appointment in the outpatient heart failure clinic on May 8 at 830.  He has a 4% no-show of 4 out of 111 appointments. ? ?Pricilla Riffle RN [facility] ?

## 2021-12-02 DIAGNOSIS — I5033 Acute on chronic diastolic (congestive) heart failure: Secondary | ICD-10-CM | POA: Diagnosis not present

## 2021-12-02 DIAGNOSIS — I160 Hypertensive urgency: Secondary | ICD-10-CM | POA: Diagnosis not present

## 2021-12-02 LAB — BASIC METABOLIC PANEL
Anion gap: 5 (ref 5–15)
BUN: 31 mg/dL — ABNORMAL HIGH (ref 8–23)
CO2: 28 mmol/L (ref 22–32)
Calcium: 8.7 mg/dL — ABNORMAL LOW (ref 8.9–10.3)
Chloride: 107 mmol/L (ref 98–111)
Creatinine, Ser: 1.02 mg/dL (ref 0.61–1.24)
GFR, Estimated: >60 mL/min (ref >60)
Glucose, Bld: 103 mg/dL — ABNORMAL HIGH (ref 70–99)
Potassium: 3.3 mmol/L — ABNORMAL LOW (ref 3.5–5.1)
Sodium: 140 mmol/L (ref 135–145)

## 2021-12-02 MED ORDER — HYDRALAZINE HCL 50 MG PO TABS
100.0000 mg | ORAL_TABLET | Freq: Three times a day (TID) | ORAL | Status: DC
  Administered 2021-12-02 – 2021-12-03 (×4): 100 mg via ORAL
  Filled 2021-12-02 (×4): qty 2

## 2021-12-02 MED ORDER — ORAL CARE MOUTH RINSE
15.0000 mL | Freq: Two times a day (BID) | OROMUCOSAL | Status: DC
  Administered 2021-12-02 – 2021-12-03 (×2): 15 mL via OROMUCOSAL

## 2021-12-02 MED ORDER — FUROSEMIDE 40 MG PO TABS
40.0000 mg | ORAL_TABLET | Freq: Every day | ORAL | Status: DC
  Administered 2021-12-03: 40 mg via ORAL
  Filled 2021-12-02: qty 1

## 2021-12-02 MED ORDER — POTASSIUM CHLORIDE CRYS ER 20 MEQ PO TBCR
40.0000 meq | EXTENDED_RELEASE_TABLET | Freq: Once | ORAL | Status: AC
  Administered 2021-12-02: 40 meq via ORAL
  Filled 2021-12-02: qty 2

## 2021-12-02 NOTE — Plan of Care (Signed)
Nutrition Education Note ? ?RD consulted for nutrition education regarding new onset CHF. ? ?Case discussed with Heart Failure RN.  ? ?Spoke with pt at bedside, who reports feeling better. He is frustrated that he is unsure about the etiology of his CHF, as he consumes a low salt diet at home. He cooks at home, usually meats with spices and makes his own tomato sauce. RD reinforced importance of self-management of CHF (low sodium diet, medication compliance, and daily weights) to help prevent further hospitalizations. Pt expressed understanding. ? ?RD provided "Low Sodium Nutrition Therapy" handout from the Academy of [employer]. Reviewed patient's dietary recall. Provided examples on ways to decrease sodium intake in diet. Discouraged intake of processed foods and use of salt shaker. Encouraged fresh fruits and vegetables as well as whole grain sources of carbohydrates to maximize fiber intake.  ? ?RD discussed why it is important for patient to adhere to diet recommendations, and emphasized the role of fluids, foods to avoid, and importance of weighing self daily. Teach back method used. ? ?Expect fair to good compliance. ? ?Current diet order is heart healthy (will liberalize to 2 gram sodium for wider variety of meal selections), patient is consuming approximately 100% of meals at this time. Labs and medications reviewed. No further nutrition interventions warranted at this time. RD contact information provided. If additional nutrition issues arise, please re-consult RD.  ? ?Loistine Chance, RD, LDN, CDCES ?Registered Dietitian II ?Certified Diabetes Care and Education Specialist ?Please refer to [facility] for RD and/or RD on-call/weekend/after hours pager   ?

## 2021-12-02 NOTE — Plan of Care (Signed)

## 2021-12-02 NOTE — Progress Notes (Signed)
? ?Progress Note ? ?Patient Name: John Gallegos ?Date of Encounter: 12/02/2021 ? ?[facility] Cardiologist: John Rogue, MD  ? ?Subjective  ? ?UOP -1.9L. Kidney function mildly up this AM. Bps better, elevated at times. Overall patient is feeling good. Potassium still mildly low.  ? ?Inpatient Medications  ?  ?Scheduled Meds: ? aspirin EC  81 mg Oral Daily  ? carvedilol  6.25 mg Oral BID WC  ? doxazosin  2 mg Oral BID  ? enoxaparin (LOVENOX) injection  40 mg Subcutaneous QHS  ? [START ON 12/03/2021] furosemide  40 mg Oral Daily  ? gabapentin  300 mg Oral QHS  ? hydrALAZINE  100 mg Oral Q8H  ? losartan  100 mg Oral Daily  ? mouth rinse  15 mL Mouth Rinse BID  ? potassium chloride SA  20 mEq Oral Q M,W,F  ? pravastatin  40 mg Oral QHS  ? spironolactone  25 mg Oral Daily  ? zolpidem  5 mg Oral QHS  ? ?Continuous Infusions: ? ?PRN Meds: ?acetaminophen **OR** acetaminophen, hydrALAZINE, magnesium hydroxide, traZODone  ? ?Vital Signs  ?  ?Vitals:  ? 12/02/21 0124 12/02/21 0445 12/02/21 0500 12/02/21 0728  ?BP: 122/74 131/74  (!) 149/92  ?Pulse: (!) 52 (!) 54  (!) 51  ?Resp: '18 16  17  '$ ?Temp: 97.7 ?F (36.5 ?C) 97.9 ?F (36.6 ?C)  97.7 ?F (36.5 ?C)  ?TempSrc: Oral Oral    ?SpO2: 97% 95%  94%  ?Weight:   101.2 kg   ?Height:      ? ? ?Intake/Output Summary (Last 24 hours) at 12/02/2021 0943 ?Last data filed at 12/02/2021 0446 ?Gross per 24 hour  ?Intake --  ?Output 1920 ml  ?Net -1920 ml  ? ? ?  12/02/2021  ?  5:00 AM 12/01/2021  ?  5:00 AM 11/30/2021  ?  8:15 PM  ?Last 3 Weights  ?Weight (lbs) 223 lb 229 lb 4.5 oz 225 lb  ?Weight (kg) 101.152 kg 104 kg 102.059 kg  ?   ? ?Telemetry  ?  ?NSR, HR 50s, upper 40s overnight - Personally Reviewed ? ?ECG  ?  ?NO new - Personally Reviewed ? ?Physical Exam  ? ?GEN: No acute distress.   ?Neck: No JVD ?Cardiac: RRR, no murmurs, rubs, or gallops.  ?Respiratory: mild crackles right lower lung. ?GI: Soft, nontender, non-distended  ?MS: No edema; No deformity. ?Neuro:  Nonfocal   ?Psych: Normal affect  ? ?Labs  ?  ?High Sensitivity Troponin:   ?Recent Labs  ?Lab 11/30/21 ?2019 11/30/21 ?2314  ?TROPONINIHS 62* 68*  ?   ?Chemistry ?Recent Labs  ?Lab 11/30/21 ?2019 12/01/21 ?0356 12/02/21 ?0541  ?NA 140 138 140  ?K 3.5 3.2* 3.3*  ?CL 106 106 107  ?CO2 '26 23 28  '$ ?GLUCOSE 102* 143* 103*  ?BUN 18 19 31*  ?CREATININE 1.15 0.99 1.02  ?CALCIUM 9.1 8.6* 8.7*  ?MG  --  2.1  --   ?PROT 7.4  --   --   ?ALBUMIN 3.6  --   --   ?AST 17  --   --   ?ALT 12  --   --   ?ALKPHOS 82  --   --   ?BILITOT 1.1  --   --   ?GFRNONAA >60 >60 >60  ?ANIONGAP '8 9 5  '$ ?  ?Lipids No results for input(s): CHOL, TRIG, HDL, LABVLDL, LDLCALC, CHOLHDL in the last 168 hours.  ?Hematology ?Recent Labs  ?Lab 11/30/21 ?2019 12/01/21 ?0356  ?WBC  7.9 6.6  ?RBC 4.44 4.34  ?HGB 13.2 13.0  ?HCT 41.0 39.3  ?MCV 92.3 90.6  ?MCH 29.7 30.0  ?MCHC 32.2 33.1  ?RDW 12.9 12.9  ?PLT 206 200  ? ?Thyroid No results for input(s): TSH, FREET4 in the last 168 hours.  ?BNP ?Recent Labs  ?Lab 11/30/21 ?2019  ?BNP 873.1*  ?  ?DDimer No results for input(s): DDIMER in the last 168 hours.  ? ?Radiology  ?  ?US Venous Img Lower Unilateral Right (DVT) ? ?Result Date: 12/01/2021 ?CLINICAL DATA:  Right lower extremity swelling EXAM: RIGHT LOWER EXTREMITY VENOUS DOPPLER ULTRASOUND TECHNIQUE: Gray-scale sonography with graded compression, as well as color Doppler and duplex ultrasound were performed to evaluate the lower extremity deep venous systems from the level of the common femoral vein and including the common femoral, femoral, profunda femoral, popliteal and calf veins including the posterior tibial, peroneal and gastrocnemius veins when visible. The superficial great saphenous vein was also interrogated. Spectral Doppler was utilized to evaluate flow at rest and with distal augmentation maneuvers in the common femoral, femoral and popliteal veins. COMPARISON:  None. FINDINGS: Contralateral Common Femoral Vein: Respiratory phasicity is normal and symmetric  with the symptomatic side. No evidence of thrombus. Normal compressibility. Common Femoral Vein: No evidence of thrombus. Normal compressibility, respiratory phasicity and response to augmentation. Saphenofemoral Junction: No evidence of thrombus. Normal compressibility and flow on color Doppler imaging. Profunda Femoral Vein: No evidence of thrombus. Normal compressibility and flow on color Doppler imaging. Femoral Vein: No evidence of thrombus. Normal compressibility, respiratory phasicity and response to augmentation. Popliteal Vein: No evidence of thrombus. Normal compressibility, respiratory phasicity and response to augmentation. Calf Veins: No evidence of thrombus. Normal compressibility and flow on color Doppler imaging. IMPRESSION: No evidence of deep venous thrombosis. Electronically Signed   By: John Mages.  Gallegos M.D.   On: 12/01/2021 13:09  ? ?DG Chest Portable 1 View ? ?Result Date: 11/30/2021 ?CLINICAL DATA:  Shortness of breath EXAM: PORTABLE CHEST 1 VIEW COMPARISON:  09/13/2020 FINDINGS: Cardiomegaly with mild interstitial edema. Small bilateral pleural effusions. Superimposed bilateral lower lobe opacities, favoring atelectasis, although left lower lobe pneumonia is not excluded. No pneumothorax. IMPRESSION: Cardiomegaly with mild interstitial edema and small bilateral pleural effusions. Superimposed bilateral lower lobe opacities, likely atelectasis, although left lower lobe pneumonia is not excluded. Electronically Signed   By: Julian Hy M.D.   On: 11/30/2021 21:11  ? ?ECHOCARDIOGRAM COMPLETE ? ?Result Date: 12/01/2021 ?   ECHOCARDIOGRAM REPORT   Patient Name:   John Gallegos Date of Exam: 12/01/2021 Medical Rec #:  [id number]           Height:       73.0 in Accession #:    [id number]          Weight:       229.3 lb Date of Birth:  [birth year]           BSA:          2.280 m? Patient Age:    76 years            BP:           158/91 mmHg Patient Gender: M                   HR:           71 bpm.  Exam Location:  [facility] Procedure: 2D Echo, Cardiac Doppler and Color Doppler Indications:     CHF-acute diastolic I50.27  History:         Patient has no prior history of Echocardiogram examinations and                  Patient has prior history of Echocardiogram examinations, most                  recent 09/20/2017. COPD; Risk Factors:Hypertension.  Sonographer:     Sherrie Sport Referring Phys:  [id number] JAN A MANSY Diagnosing Phys: Harrell Gave End MD  Sonographer Comments: Suboptimal apical window and suboptimal parasternal window. Image acquisition challenging due to COPD. IMPRESSIONS  1. Left ventricular ejection fraction, by estimation, is 60 to 65%. The left ventricle has normal function. Left ventricular endocardial border not optimally defined to evaluate regional wall motion. There is severe asymmetric left ventricular hypertrophy of the septal segment. Left ventricular diastolic parameters are consistent with Grade II diastolic dysfunction (pseudonormalization). Elevated left atrial pressure.  2. Right ventricular systolic function is normal. The right ventricular size is not well visualized. Tricuspid regurgitation signal is inadequate for assessing PA pressure.  3. Left atrial size was mildly dilated.  4. Right atrial size was mildly dilated.  5. The mitral valve was not well visualized. No evidence of mitral valve regurgitation. No evidence of mitral stenosis.  6. The aortic valve was not well visualized. Aortic valve regurgitation is not visualized. No aortic stenosis is present. FINDINGS  Left Ventricle: Left ventricular ejection fraction, by estimation, is 60 to 65%. The left ventricle has normal function. Left ventricular endocardial border not optimally defined to evaluate regional wall motion. The left ventricular internal cavity size was normal in size. There is severe asymmetric left ventricular hypertrophy of the septal segment. Left ventricular diastolic parameters are consistent with Grade II  diastolic dysfunction (pseudonormalization). Elevated left atrial pressure. Right Ventricle: The right ventricular size is not well visualized. Right vetricular wall thickness was not well visualized. Right ventricu

## 2021-12-02 NOTE — Progress Notes (Signed)
HR dropping to low 40s. Notified MD.  ? ?

## 2021-12-02 NOTE — Discharge Instructions (Signed)
Low Sodium Nutrition Therapy  ?Eating less sodium can help you if you have high blood pressure, heart failure, or kidney or liver disease.  ? ?Your body needs a little sodium, but too much sodium can cause your body to hold onto extra water. This extra water will raise your blood pressure and can cause damage to your heart, kidneys, or liver as they are forced to work harder.  ? ?Sometimes you can see how the extra fluid affects you because your hands, legs, or belly swell. You may also hold water around your heart and lungs, which makes it hard to breathe.  ? ?Even if you take medication for blood pressure or a water pill (diuretic) to remove fluid, it is still important to have less salt in your diet.  ? ?Check with your primary care provider before drinking alcohol since it may affect the amount of fluid in your body and how your heart, kidneys, or liver work. ?Sodium in Food ?A low-sodium meal plan limits the sodium that you get from food and beverages to 1,500-2,000 milligrams (mg) per day. Salt is the main source of sodium. Read the nutrition label on the package to find out how much sodium is in one serving of a food.  ?Select foods with 140 milligrams (mg) of sodium or less per serving.  ?You may be able to eat one or two servings of foods with a little more than 140 milligrams (mg) of sodium if you are closely watching how much sodium you eat in a day.  ?Check the serving size on the label. The amount of sodium listed on the label shows the amount in one serving of the food. So, if you eat more than one serving, you will get more sodium than the amount listed. ? ?Tips ?Cutting Back on Sodium ?Eat more fresh foods.  ?Fresh fruits and vegetables are low in sodium, as well as frozen vegetables and fruits that have no added juices or sauces.  ?Fresh meats are lower in sodium than processed meats, such as bacon, sausage, and hotdogs.  ?Not all processed foods are unhealthy, but some processed foods may have too  much sodium.  ?Eat less salt at the table and when cooking. One of the ingredients in salt is sodium.  ?One teaspoon of table salt has 2,300 milligrams of sodium.  ?Leave the salt out of recipes for pasta, casseroles, and soups. ?Be a Paramedic.  ?Food packages that say ?Salt-free?, sodium-free?, ?very low sodium,? and ?low sodium? have less than 140 milligrams of sodium per serving.  ?Beware of products identified as ?Unsalted,? ?No Salt Added,? ?Reduced Sodium,? or ?Lower Sodium.? These items may still be high in sodium. You should always check the nutrition label. ?Add flavors to your food without adding sodium.  ?Try lemon juice, lime juice, or vinegar.  ?Dry or fresh herbs add flavor.  ?Buy a sodium-free seasoning blend or make your own at home. ?You can purchase salt-free or sodium-free condiments like barbeque sauce in stores and online. Ask your registered dietitian nutritionist for recommendations and where to find them.  ? ?Eating in Restaurants ?Choose foods carefully when you eat outside your home. Restaurant foods can be very high in sodium. Many restaurants provide nutrition facts on their menus or their websites. If you cannot find that information, ask your server. Let your server know that you want your food to be cooked without salt and that you would like your salad dressing and sauces to be served on the  side.  ? ? ?Foods Recommended ?Food Group Foods Recommended  ?Grains Bread, bagels, rolls without salted tops ?Homemade bread made with reduced-sodium baking powder ?Cold cereals, especially shredded wheat and puffed rice ?Oats, grits, or cream of wheat ?Pastas, quinoa, and rice ?Popcorn, pretzels or crackers without salt ?Corn tortillas  ?Protein Foods Fresh meats and fish; [place] bacon (check the nutrition labels - make sure they are not packaged in a sodium solution) ?Canned or packed tuna (no more than 4 ounces at 1 serving) ?Beans and peas ?Soybeans) and tofu ?Eggs ?Nuts or nut butters  without salt  ?Dairy Milk or milk powder ?Plant milks, such as rice and soy ?Yogurt, including Greek yogurt ?Small amounts of natural cheese (blocks of cheese) or reduced-sodium cheese can be used in moderation. (Swiss, ricotta, and fresh mozzarella cheese are lower in sodium than the others) ?Cream Cheese ?Low sodium cottage cheese  ?Vegetables Fresh and frozen vegetables without added sauces or salt ?Homemade soups (without salt) ?Low-sodium, salt-free or sodium-free canned vegetables and soups  ?Fruit Fresh and canned fruits ?Dried fruits, such as raisins, cranberries, and prunes  ?Oils Tub or liquid margarine, regular or without salt ?Canola, corn, peanut, olive, safflower, or sunflower oils  ?Condiments Fresh or dried herbs such as basil, bay leaf, dill, mustard (dry), nutmeg, paprika, parsley, rosemary, sage, or thyme.  ?Low sodium ketchup ?Vinegar  ?Lemon or lime juice ?Pepper, red pepper flakes, and cayenne. ?Hot sauce contains sodium, but if you use just a drop or two, it will not add up to much.  ?Salt-free or sodium-free seasoning mixes and marinades ?Simple salad dressings: vinegar and oil  ? ?Foods Not Recommended ?Food Group Foods Not Recommended  ?Grains Breads or crackers topped with salt ?Cereals (hot/cold) with more than 300 mg sodium per serving ?Biscuits, cornbread, and other ?quick? breads prepared with baking soda ?Pre-packaged bread crumbs ?Seasoned and packaged rice and pasta mixes ?Self-rising flours  ?Protein Foods Cured meats: Bacon, ham, sausage, pepperoni and hot dogs ?Canned meats (chili, vienna sausage, or sardines) ?Smoked fish and meats ?Frozen meals that have more than 600 mg of sodium per serving ?Egg substitute (with added sodium)  ?Dairy Buttermilk ?Processed cheese spreads ?Cottage cheese (1 cup may have over 500 mg of sodium; look for low-sodium.) ?American or feta cheese ?Shredded Cheese has more sodium than blocks of cheese ?String cheese  ?Vegetables Canned vegetables  (unless they are salt-free, sodium-free or low sodium) ?Frozen vegetables with seasoning and sauces ?Sauerkraut and pickled vegetables ?Canned or dried soups (unless they are salt-free, sodium-free, or low sodium) ?[place] fries and onion rings  ?Fruit Dried fruits preserved with additives that have sodium  ?Oils Salted butter or margarine, all types of olives  ?Condiments Salt, sea salt, kosher salt, onion salt, and garlic salt ?Seasoning mixes with salt ?Bouillon cubes ?Ketchup ?Barbeque sauce and Worcestershire sauce unless low sodium ?Soy sauce ?Salsa, pickles, olives, relish ?Salad dressings: ranch, blue cheese, [place], and [place].  ? ?Low Sodium Sample 1-Day Menu  ?Breakfast 1 cup cooked oatmeal  ?1 slice whole wheat bread toast  ?1 tablespoon peanut butter without salt  ?1 banana  ?1 cup 1% milk  ?Lunch Tacos made with: 2 corn tortillas  ?? cup black beans, low sodium  ?? cup roasted or grilled chicken (without skin)  ?? avocado  ?Squeeze of lime juice  ?1 cup salad greens  ?1 tablespoon low-sodium salad dressing  ?? cup strawberries  ?1 orange  ?Afternoon Snack 1/3 cup grapes  ?6 ounces yogurt  ?  Evening Meal 3 ounces herb-baked fish  ?1 baked potato  ?2 teaspoons olive oil  ?? cup cooked carrots  ?2 thick slices tomatoes on:  ?2 lettuce leaves  ?1 teaspoon olive oil  ?1 teaspoon balsamic vinegar  ?1 cup 1% milk  ?Evening Snack 1 apple  ?? cup almonds without salt  ? ?Low-Sodium Vegetarian (Lacto-Ovo) Sample 1-Day Menu  ?Breakfast 1 cup cooked oatmeal  ?1 slice whole wheat toast  ?1 tablespoon peanut butter without salt  ?1 banana  ?1 cup 1% milk  ?Lunch Tacos made with: 2 corn tortillas  ?? cup black beans, low sodium  ?? cup roasted or grilled chicken (without skin)  ?? avocado  ?Squeeze of lime juice  ?1 cup salad greens  ?1 tablespoon low-sodium salad dressing  ?? cup strawberries  ?1 orange  ?Evening Meal Stir fry made with: ? cup tofu  ?1 cup brown rice  ?? cup broccoli  ?? cup green beans  ?? cup  peppers  ?? tablespoon peanut oil  ?1 orange  ?1 cup 1% milk  ?Evening Snack 4 strips celery  ?2 tablespoons hummus  ?1 hard-boiled egg  ? ?Low-Sodium Vegan Sample 1-Day Menu  ?Breakfast 1 cup cooked oatmeal  ?1

## 2021-12-02 NOTE — Progress Notes (Signed)
? ?  Heart Failure Nurse Navigator Note ? ?Met with patient, he was sitting up in the bed, states his breathing is much improved.    ? ?Discussed getting a scale and blood pressure machine.  Went over resting 10 minutes before taking blood pressure. ? ?He states he has not made up his mind about the Ventric[facility] health program but will follow in the heart failure clinic. ? ?He has no further questions. ? ?Pricilla Riffle RN [facility] ?

## 2021-12-02 NOTE — Progress Notes (Signed)
?  Progress Note ? ? ?Patient: John Gallegos YYP:[id number] DOB: 1946-06-09 DOA: 11/30/2021     2 ?DOS: the patient was seen and examined on 12/02/2021 ?  ?Brief hospital course: ?John Gallegos is a 76 y.o. male with medical history significant for gout, hypertension, non-Hodgkin's lymphoma, and hearing loss, who presented to the ED on evening of 11/30/2021 with worsening dyspnea over 2 to 3 days Got significantly worse. ?  ?ED course --BP severely elevated 209/122, RR 26 with respiratory distress, placed on BiPAP.  Afebrile and O2 sats were stable.  BNP was elevated 873.1, at hs-troponin 62.  Chest x-ray showed cardiomegaly with mild interstitial pulmonary edema and small bilateral pleural effusions with superimposed bilateral lower lobe atelectasis (versus infiltrate). ?  ?Patient was started on IV diuresis, treated with Nitropaste and sublingual nitro and admitted to the hospitalist service with cardiology consulted. ? ? ?Assessment and Plan: ?* Acute on chronic diastolic CHF (congestive heart failure) ([place]) ?In the setting of hypertensive urgency. ?Echo 4/25: EF 60-65%, severe asymmetric LVH, grade 2 diastolic dysfunction. ?--Cardiology following ?--On IV Lasix 40 mg BID converted to oral lasix today ?--, doxazosin, hydralazine, losartan ?- coreg on hold 2/2 bradycardia ?--Hold HCTZ on Lasix ?--Aldactone added ?--Consider SGLT2 inhibitor  ? ?Hypertensive urgency ?Resolved with above treatments and med changes ? ?Coronary artery disease ?Continue aspirin & stain, Coreg, losartan ? ?Peripheral neuropathy ?On Neurontin ? ?Dyslipidemia ?Continue statin  ? ?Lymphoma ?In remission ? ?Hypokalemia ?Mg wnl yesterday ?- 40 po ? ?Edema, right leg ?Greater than left. PVL neg for dvt ? ? ? ?Subjective: dyspnea and lower extremity swelling much improved. ? ?Physical Exam: ?Vitals:  ? 12/02/21 0500 12/02/21 0728 12/02/21 0800 12/02/21 1423  ?BP:  (!) 149/92  124/76  ?Pulse:  (!) 51 61 (!) 58  ?Resp:  17  18  ?Temp:  97.7  ?F (36.5 ?C)  97.7 ?F (36.5 ?C)  ?TempSrc:  Oral  Oral  ?SpO2:  94%  95%  ?Weight: 101.2 kg     ?Height:      ? ?General exam: awake, alert, no acute distress ?HEENT: oist mucus membranes, hearing grossly normal  ?Respiratory system: CTAB, no wheezes, normal respiratory effort. ?Cardiovascular system: normal S1/S2, RRR, right greater than left lower extremity pretibial edema   ?Gastrointestinal system: soft, NT ?Central nervous system: A&O x3. no gross focal neurologic deficits, normal speech ?Extremities: moves all, no cyanosis, normal tone ?Skin: dry, intact, normal temperature ?Psychiatry: normal mood, congruent affect, judgement and insight appear normal ? ? ?Family Communication: None ? ?Disposition: ?Status is: Inpatient ?Remains inpatient appropriate because: pending AM labs ? ? Planned Discharge Destination: Home ? ?Time spent: 30 minutes ? ?Author: ?Desma Maxim, MD ?12/02/2021 3:25 PM ? ?For on call review [email or web address].  ?

## 2021-12-02 NOTE — TOC CM/SW Note (Signed)
?  Transition of Care (TOC) Screening Note ? ? ?Patient Details  ?Name: John Gallegos ?Date of Birth: 1946-02-15 ? ? ?Transition of Care (TOC) CM/SW Contact:    ?Candie Chroman, LCSW ?Phone Number: ?12/02/2021, 4:03 PM ? ? ? ?Transition of Care Department [facility]) has reviewed patient and no TOC needs have been identified at this time. We will continue to monitor patient advancement through interdisciplinary progression rounds. If new patient transition needs arise, please place a TOC consult. ? ? ?

## 2021-12-03 DIAGNOSIS — J9601 Acute respiratory failure with hypoxia: Secondary | ICD-10-CM | POA: Diagnosis not present

## 2021-12-03 DIAGNOSIS — I25118 Atherosclerotic heart disease of native coronary artery with other forms of angina pectoris: Secondary | ICD-10-CM

## 2021-12-03 DIAGNOSIS — C8518 Unspecified B-cell lymphoma, lymph nodes of multiple sites: Secondary | ICD-10-CM | POA: Diagnosis not present

## 2021-12-03 DIAGNOSIS — I5033 Acute on chronic diastolic (congestive) heart failure: Secondary | ICD-10-CM | POA: Diagnosis not present

## 2021-12-03 DIAGNOSIS — E876 Hypokalemia: Secondary | ICD-10-CM

## 2021-12-03 LAB — BASIC METABOLIC PANEL
Anion gap: 5 (ref 5–15)
BUN: 28 mg/dL — ABNORMAL HIGH (ref 8–23)
CO2: 27 mmol/L (ref 22–32)
Calcium: 8.9 mg/dL (ref 8.9–10.3)
Chloride: 107 mmol/L (ref 98–111)
Creatinine, Ser: 0.88 mg/dL (ref 0.61–1.24)
GFR, Estimated: >60 mL/min (ref >60)
Glucose, Bld: 95 mg/dL (ref 70–99)
Potassium: 3.5 mmol/L (ref 3.5–5.1)
Sodium: 139 mmol/L (ref 135–145)

## 2021-12-03 MED ORDER — LOSARTAN POTASSIUM 100 MG PO TABS: 100.0000 mg | ORAL_TABLET | Freq: Every day | ORAL | 1 refills | Status: AC

## 2021-12-03 MED ORDER — FUROSEMIDE 40 MG PO TABS: 40.0000 mg | ORAL_TABLET | Freq: Every day | ORAL | 1 refills | Status: DC

## 2021-12-03 MED ORDER — SPIRONOLACTONE 25 MG PO TABS
25.0000 mg | ORAL_TABLET | Freq: Every day | ORAL | 1 refills | Status: AC
Start: 2021-12-04 — End: ?

## 2021-12-03 MED ORDER — POTASSIUM CHLORIDE CRYS ER 20 MEQ PO TBCR: 20.0000 meq | EXTENDED_RELEASE_TABLET | Freq: Every day | ORAL | 1 refills | Status: DC

## 2021-12-03 NOTE — Discharge Summary (Signed)
John Gallegos DXI:[id number] DOB: Jan 15, 1946 DOA: 11/30/2021 ? ?PCP: Tracie Harrier, MD ? ?Admit date: 11/30/2021 ?Discharge date: 12/03/2021 ? ?Time spent: 40 minutes ? ?Recommendations for Outpatient Follow-up:  ?Pcp f/u, cardiology f/u ?Bmp 1-2 weeks  ? ? ? ?Discharge Diagnoses:  ?Principal Problem: ?  Acute on chronic diastolic CHF (congestive heart failure) ([place]) ?Active Problems: ?  Hypertensive urgency ?  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  Hypokalemia ?  Dyslipidemia ?  Peripheral neuropathy ?  Coronary artery disease ? ? ?Discharge Condition: stable ? ?Diet recommendation: heart healthy ? ?Filed Weights  ? 12/01/21 0500 12/02/21 0500 12/03/21 0318  ?Weight: 104 kg 101.2 kg 100.3 kg  ? ? ?History of present illness:  ?From admission h and p by dr. Sidney Ace: ?John Gallegos is a 76 y.o. Caucasian male with medical history significant for gout, hypertension, non-Hodgkin's lymphoma, and hearing loss, who presented to the ER with acute onset of worsening dyspnea over the last 2 to 3 days to get significantly worse today. ? ?Hospital Course:  ?Patient presented with acute diastolic heart failure. EF preserved on TTE, grade 2 dd. Treated with IV lasix and symptoms improved. Discharged on losartan, spironolactone, and lasix. Will need bmp in 1-2 weeks. For bradycardia home coreg was held. Hypertensive urgency resolved with diuresis. Hypokalemia was repleted and he will start potassium supplement. He will follow up with cardiology as outpatient. ? ?Procedures: ?none  ? ?Consultations: ? cardiology ? ?Discharge Exam: ?Vitals:  ? 12/03/21 0320 12/03/21 0836  ?BP: (!) 143/77 (!) 145/82  ?Pulse: (!) 54 (!) 54  ?Resp: 18 18  ?Temp: 97.6 ?F (36.4 ?C) 97.6 ?F (36.4 ?C)  ?SpO2: 94% 95%  ? ? ?General exam: awake, alert, no acute distress ?HEENT: oist mucus membranes, hearing grossly normal  ?Respiratory system: CTAB, no wheezes, normal respiratory effort. ?Cardiovascular system: normal S1/S2,  RRR, right greater than left lower extremity pretibial edema   ?Gastrointestinal system: soft, NT ?Central nervous system: A&O x3. no gross focal neurologic deficits, normal speech ?Extremities: moves all, no cyanosis, normal tone ?Skin: dry, intact, normal temperature ?Psychiatry: normal mood, congruent affect, judgement and insight appear normal ? ?Discharge Instructions ? ? ?Discharge Instructions   ? ? Diet - low sodium heart healthy   Complete by: As directed ?  ? Increase activity slowly   Complete by: As directed ?  ? No wound care   Complete by: As directed ?  ? ?  ? ?Allergies as of 12/03/2021   ? ?   Reactions  ? Amlodipine   ? Severe leg swelling  ? ?  ? ?  ?Medication List  ?  ? ?STOP taking these medications   ? ?carvedilol 6.25 MG tablet ?Commonly known as: COREG ?  ?losartan-hydrochlorothiazide 100-25 MG tablet ?Commonly known as: HYZAAR ?  ? ?  ? ?TAKE these medications   ? ?aspirin EC 81 MG tablet ?Take 81 mg by mouth daily. ?  ?doxazosin 2 MG tablet ?Commonly known as: CARDURA ?TAKE 1 TABLET(2 MG) BY MOUTH TWICE DAILY ?  ?furosemide 40 MG tablet ?Commonly known as: LASIX ?Take 1 tablet (40 mg total) by mouth daily. ?Start taking on: December 04, 2021 ?  ?gabapentin 300 MG capsule ?Commonly known as: NEURONTIN ?TAKE 1 CAPSULE(300 MG) BY MOUTH AT BEDTIME ?  ?hydrALAZINE 100 MG tablet ?Commonly known as: APRESOLINE ?Take 100 mg by mouth 2 (two) times daily. ?  ?losartan 100 MG tablet ?Commonly known as: COZAAR ?Take 1 tablet (100  mg total) by mouth daily. ?Start taking on: December 04, 2021 ?  ?meloxicam 7.5 MG tablet ?Commonly known as: MOBIC ?Take 7.5 mg by mouth daily as needed. ?  ?potassium chloride SA 20 MEQ tablet ?Commonly known as: KLOR-CON M ?Take 1 tablet (20 mEq total) by mouth daily. Monday, Wednesday and Friday. ?What changed:  ?how much to take ?how to take this ?when to take this ?  ?pravastatin 40 MG tablet ?Commonly known as: PRAVACHOL ?Take 1 tablet (40 mg total) by mouth at bedtime. ?   ?spironolactone 25 MG tablet ?Commonly known as: ALDACTONE ?Take 1 tablet (25 mg total) by mouth daily. ?Start taking on: December 04, 2021 ?  ?zolpidem 10 MG tablet ?Commonly known as: AMBIEN ?Take 10 mg by mouth at bedtime. ?  ? ?  ? ?Allergies  ?Allergen Reactions  ? Amlodipine   ?  Severe leg swelling  ? ? Follow-up Information   ? ? Tracie Harrier, MD Follow up.   ?Specialty: Internal Medicine ?Contact information: ?[address] ?[place] [address] ?[phone] ? ? ?  ?  ? ? Minna Merritts, MD .   ?Specialty: Cardiology ?Contact information: ?[address] ?[place] [address] ?[phone] ? ? ?  ?  ? ?  ?  ? ?  ? ? ? ?The results of significant diagnostics from this hospitalization (including imaging, microbiology, ancillary and laboratory) are listed below for reference.   ? ?Significant Diagnostic Studies: ?US Venous Img Lower Unilateral Right (DVT) ? ?Result Date: 12/01/2021 ?CLINICAL DATA:  Right lower extremity swelling EXAM: RIGHT LOWER EXTREMITY VENOUS DOPPLER ULTRASOUND TECHNIQUE: Gray-scale sonography with graded compression, as well as color Doppler and duplex ultrasound were performed to evaluate the lower extremity deep venous systems from the level of the common femoral vein and including the common femoral, femoral, profunda femoral, popliteal and calf veins including the posterior tibial, peroneal and gastrocnemius veins when visible. The superficial great saphenous vein was also interrogated. Spectral Doppler was utilized to evaluate flow at rest and with distal augmentation maneuvers in the common femoral, femoral and popliteal veins. COMPARISON:  None. FINDINGS: Contralateral Common Femoral Vein: Respiratory phasicity is normal and symmetric with the symptomatic side. No evidence of thrombus. Normal compressibility. Common Femoral Vein: No evidence of thrombus. Normal compressibility, respiratory phasicity and response to augmentation.  Saphenofemoral Junction: No evidence of thrombus. Normal compressibility and flow on color Doppler imaging. Profunda Femoral Vein: No evidence of thrombus. Normal compressibility and flow on color Doppler imaging. Femoral Vein: No evidence of thrombus. Normal compressibility, respiratory phasicity and response to augmentation. Popliteal Vein: No evidence of thrombus. Normal compressibility, respiratory phasicity and response to augmentation. Calf Veins: No evidence of thrombus. Normal compressibility and flow on color Doppler imaging. IMPRESSION: No evidence of deep venous thrombosis. Electronically Signed   By: Jerilynn Mages.  Shick M.D.   On: 12/01/2021 13:09  ? ?DG Chest Portable 1 View ? ?Result Date: 11/30/2021 ?CLINICAL DATA:  Shortness of breath EXAM: PORTABLE CHEST 1 VIEW COMPARISON:  09/13/2020 FINDINGS: Cardiomegaly with mild interstitial edema. Small bilateral pleural effusions. Superimposed bilateral lower lobe opacities, favoring atelectasis, although left lower lobe pneumonia is not excluded. No pneumothorax. IMPRESSION: Cardiomegaly with mild interstitial edema and small bilateral pleural effusions. Superimposed bilateral lower lobe opacities, likely atelectasis, although left lower lobe pneumonia is not excluded. Electronically Signed   By: Julian Hy M.D.   On: 11/30/2021 21:11  ? ?ECHOCARDIOGRAM COMPLETE ? ?Result Date: 12/01/2021 ?  ECHOCARDIOGRAM REPORT   Patient Name:   MONTARIUS KITAGAWA Date of Exam: 12/01/2021 Medical Rec #:  [id number]           Height:       73.0 in Accession #:    [id number]          Weight:       229.3 lb Date of Birth:  24-Feb-1946           BSA:          2.280 m? Patient Age:    54 years            BP:           158/91 mmHg Patient Gender: M                   HR:           71 bpm. Exam Location:  [facility] Procedure: 2D Echo, Cardiac Doppler and Color Doppler Indications:     CHF-acute diastolic F02.77  History:         Patient has no prior history of Echocardiogram examinations and                   Patient has prior history of Echocardiogram examinations, most                  recent 09/20/2017. COPD; Risk Factors:Hypertension.  Sonographer:     Sherrie Sport Referring Phys:  [id number] [place] A

## 2021-12-03 NOTE — Progress Notes (Signed)
? ?Progress Note ? ?Patient Name: John Gallegos ?Date of Encounter: 12/03/2021 ? ?[facility] Cardiologist: Ida Rogue, MD  ? ?Subjective  ? ?Ambulating around the unit, feels his breathing is improved. ?Feels close to his baseline ?Concerned about his medication regiment for home ?Long history of smoking 35 years stopped 2018 ?Does not have inhaler or pulmonary regiment ?Reports he was not taking his home blood pressure medications appropriately, would cluster some of his medications for example would take 3 hydralazine at nighttime together ?--Was not checking his blood pressure at home ? ?Inpatient Medications  ?  ?Scheduled Meds: ? aspirin EC  81 mg Oral Daily  ? doxazosin  2 mg Oral BID  ? enoxaparin (LOVENOX) injection  40 mg Subcutaneous QHS  ? furosemide  40 mg Oral Daily  ? gabapentin  300 mg Oral QHS  ? hydrALAZINE  100 mg Oral Q8H  ? losartan  100 mg Oral Daily  ? mouth rinse  15 mL Mouth Rinse BID  ? potassium chloride SA  20 mEq Oral Q M,W,F  ? pravastatin  40 mg Oral QHS  ? spironolactone  25 mg Oral Daily  ? zolpidem  5 mg Oral QHS  ? ?Continuous Infusions: ? ?PRN Meds: ?acetaminophen **OR** acetaminophen, hydrALAZINE, magnesium hydroxide, traZODone  ? ?Vital Signs  ?  ?Vitals:  ? 12/02/21 2335 12/03/21 1497 12/03/21 0320 12/03/21 0836  ?BP: (!) 161/84  (!) 143/77 (!) 145/82  ?Pulse: 62  (!) 54 (!) 54  ?Resp: '17  18 18  '$ ?Temp: 98 ?F (36.7 ?C)  97.6 ?F (36.4 ?C) 97.6 ?F (36.4 ?C)  ?TempSrc: Oral  Oral Oral  ?SpO2: 95%  94% 95%  ?Weight:  100.3 kg    ?Height:      ? ? ?Intake/Output Summary (Last 24 hours) at 12/03/2021 0957 ?Last data filed at 12/03/2021 0310 ?Gross per 24 hour  ?Intake 600 ml  ?Output 2450 ml  ?Net -1850 ml  ? ? ?  12/03/2021  ?  3:18 AM 12/02/2021  ?  5:00 AM 12/01/2021  ?  5:00 AM  ?Last 3 Weights  ?Weight (lbs) 221 lb 3.2 oz 223 lb 229 lb 4.5 oz  ?Weight (kg) 100.336 kg 101.152 kg 104 kg  ?   ? ?Telemetry  ?  ?NSR - Personally Reviewed ? ?ECG  ?  ? - Personally  Reviewed ? ?Physical Exam  ? ?GEN: No acute distress.   ?Neck: No JVD ?Cardiac: RRR, no murmurs, rubs, or gallops.  ?Respiratory: Clear to auscultation bilaterally. ?GI: Soft, nontender, non-distended  ?MS: No edema; No deformity. ?Neuro:  Nonfocal  ?Psych: Normal affect  ? ?Labs  ?  ?High Sensitivity Troponin:   ?Recent Labs  ?Lab 11/30/21 ?2019 11/30/21 ?2314  ?TROPONINIHS 62* 68*  ?   ?Chemistry ?Recent Labs  ?Lab 11/30/21 ?2019 12/01/21 ?0356 12/02/21 ?0263 12/03/21 ?0530  ?NA 140 138 140 139  ?K 3.5 3.2* 3.3* 3.5  ?CL 106 106 107 107  ?CO2 '26 23 28 27  '$ ?GLUCOSE 102* 143* 103* 95  ?BUN 18 19 31* 28*  ?CREATININE 1.15 0.99 1.02 0.88  ?CALCIUM 9.1 8.6* 8.7* 8.9  ?MG  --  2.1  --   --   ?PROT 7.4  --   --   --   ?ALBUMIN 3.6  --   --   --   ?AST 17  --   --   --   ?ALT 12  --   --   --   ?ALKPHOS  82  --   --   --   ?BILITOT 1.1  --   --   --   ?GFRNONAA >60 >60 >60 >60  ?ANIONGAP '8 9 5 5  '$ ?  ?Lipids No results for input(s): CHOL, TRIG, HDL, LABVLDL, LDLCALC, CHOLHDL in the last 168 hours.  ?Hematology ?Recent Labs  ?Lab 11/30/21 ?2019 12/01/21 ?0356  ?WBC 7.9 6.6  ?RBC 4.44 4.34  ?HGB 13.2 13.0  ?HCT 41.0 39.3  ?MCV 92.3 90.6  ?MCH 29.7 30.0  ?MCHC 32.2 33.1  ?RDW 12.9 12.9  ?PLT 206 200  ? ?Thyroid No results for input(s): TSH, FREET4 in the last 168 hours.  ?BNP ?Recent Labs  ?Lab 11/30/21 ?2019  ?BNP 873.1*  ?  ?DDimer No results for input(s): DDIMER in the last 168 hours.  ? ?Radiology  ?  ?US Venous Img Lower Unilateral Right (DVT) ? ?Result Date: 12/01/2021 ?CLINICAL DATA:  Right lower extremity swelling EXAM: RIGHT LOWER EXTREMITY VENOUS DOPPLER ULTRASOUND TECHNIQUE: Gray-scale sonography with graded compression, as well as color Doppler and duplex ultrasound were performed to evaluate the lower extremity deep venous systems from the level of the common femoral vein and including the common femoral, femoral, profunda femoral, popliteal and calf veins including the posterior tibial, peroneal and gastrocnemius  veins when visible. The superficial great saphenous vein was also interrogated. Spectral Doppler was utilized to evaluate flow at rest and with distal augmentation maneuvers in the common femoral, femoral and popliteal veins. COMPARISON:  None. FINDINGS: Contralateral Common Femoral Vein: Respiratory phasicity is normal and symmetric with the symptomatic side. No evidence of thrombus. Normal compressibility. Common Femoral Vein: No evidence of thrombus. Normal compressibility, respiratory phasicity and response to augmentation. Saphenofemoral Junction: No evidence of thrombus. Normal compressibility and flow on color Doppler imaging. Profunda Femoral Vein: No evidence of thrombus. Normal compressibility and flow on color Doppler imaging. Femoral Vein: No evidence of thrombus. Normal compressibility, respiratory phasicity and response to augmentation. Popliteal Vein: No evidence of thrombus. Normal compressibility, respiratory phasicity and response to augmentation. Calf Veins: No evidence of thrombus. Normal compressibility and flow on color Doppler imaging. IMPRESSION: No evidence of deep venous thrombosis. Electronically Signed   By: Jerilynn Mages.  Shick M.D.   On: 12/01/2021 13:09   ? ?Cardiac Studies  ? ?Echo ? 1. Left ventricular ejection fraction, by estimation, is 60 to 65%. The  ?left ventricle has normal function. Left ventricular endocardial border  ?not optimally defined to evaluate regional wall motion. There is severe  ?asymmetric left ventricular  ?hypertrophy of the septal segment. Left ventricular diastolic parameters  ?are consistent with Grade II diastolic dysfunction (pseudonormalization).  ?Elevated left atrial pressure.  ? 2. Right ventricular systolic function is normal. The right ventricular  ?size is not well visualized. Tricuspid regurgitation signal is inadequate  ?for assessing PA pressure.  ? 3. Left atrial size was mildly dilated.  ? 4. Right atrial size was mildly dilated.  ? 5. The mitral valve  was not well visualized. No evidence of mitral valve  ?regurgitation. No evidence of mitral stenosis.  ? 6. The aortic valve was not well visualized. Aortic valve regurgitation  ?is not visualized. No aortic stenosis is present.  ? ?Patient Profile  ?   ?76 y.o. male  with a history of HTN, diastolic dysfunction, non-Hodgkin's lymphoma status post chemotherapy, remote tobacco abuse, and gout, who is being seen today for the evaluation of hypertensive urgency and heart failure. ? ?Assessment & Plan  ?  ?Hypertensive UrgencyAcute diastolic  CHF ?Pressures into the 233K systolic on arrival concerning for hypertensive heart disease ?-Treated with IV Lasix, aggressive blood pressure control with improvement of his symptoms ?-Feels he is at his baseline ?- presented with progressive dyspnea on exertion over the weekend, initially requiring BiPAP. Hs trop up to 68.  ?- BNP 873 and CXR with mild interstitial edema and small b/l pleural effusions ?- IV lasix '40mg'$  BID>transitioned to lasix '40mg'$  daily ?- Echo showed preserved LVEF with G2DD ?--Would continue Doxazosin '2mg'$ BID, Hydralazine '100mg'$ TID, losartan'100mg'$  daily, Spiro '25mg'$  daily, Lasix 40 mg daily ? ?  ?Demand ischemia ?Long smoking history, troponin up to 68 in the setting of hypertensive urgency and CHF, acute respiratory distress ?- Echo with preserved EF ?- coronary calcium on CT ?-Plan for outpatient ischemic work-up, consider cardiac CTA versus Myoview ?  ?Hypokalemia ?Low in the setting of aggressive diuresis this admission , on spiro ?will need home potassium ?  ?Prolonged QT ?- exacerbated by hypokalemia ?  ?COPD ?Reports 35 years of smoking, likely contributing to acute respiratory distress symptoms on arrival ?-Would likely benefit from albuterol inhaler as needed as outpatient ?  ?Sinus bradycardia ?- HR in the 50s/upper 40s ?Beta-blocker held ? ?Long discussion concerning CHF management, home monitoring of blood pressure and weight, follow-up in CHF clinic ?  Total encounter time more than 50 minutes ? Greater than 50% was spent in counseling and coordination of care with the patient ? ? ?For questions or updates, please contact [place] ?Please consult [email or web address]

## 2021-12-03 NOTE — Plan of Care (Signed)

## 2021-12-08 DIAGNOSIS — M1711 Unilateral primary osteoarthritis, right knee: Secondary | ICD-10-CM | POA: Diagnosis not present

## 2021-12-08 DIAGNOSIS — I5031 Acute diastolic (congestive) heart failure: Secondary | ICD-10-CM | POA: Diagnosis not present

## 2021-12-08 DIAGNOSIS — Z87891 Personal history of nicotine dependence: Secondary | ICD-10-CM | POA: Diagnosis not present

## 2021-12-08 DIAGNOSIS — I110 Hypertensive heart disease with heart failure: Secondary | ICD-10-CM | POA: Diagnosis not present

## 2021-12-08 DIAGNOSIS — G4700 Insomnia, unspecified: Secondary | ICD-10-CM | POA: Diagnosis not present

## 2021-12-08 DIAGNOSIS — C8339 Diffuse large B-cell lymphoma, extranodal and solid organ sites: Secondary | ICD-10-CM | POA: Diagnosis not present

## 2021-12-08 DIAGNOSIS — Z09 Encounter for follow-up examination after completed treatment for conditions other than malignant neoplasm: Secondary | ICD-10-CM | POA: Diagnosis not present

## 2021-12-10 ENCOUNTER — Telehealth: Payer: Self-pay

## 2021-12-10 NOTE — Telephone Encounter (Signed)
? ?  [facility] Discharge Note ? ?Called and spoke directly to patient. ? ?He feels he is doing well. Reported he saw his family doctor earlier in the week.  Blood pressure was 130/80, he states it has not been like that in years. ? ?During the hospitalization discussed getting a scale and blood pressure cuff to monitor weights and blood pressure readings at home.  He has not purchased those as of yet. ? ?Discussed his medications, he stopped the coreg and hyzaar. Changed the dosage of the potassium.  Started taking the Lasix, losartan and spironolactone. ? ?He states he is drinking- reminded not to go over restriction as that would defeat the purpose of the lasix.  He voices understanding. ? ?He states he wants to take care of himself as he does not want to return or feel that lousy again. ? ?He has not made his appointment to follow up with Dr. Rockey Situ yet. Reminded to do so. ? ?Has appointment May 8 at 8:30 AM. States transportation to the appointment is not a problem. ? ?Pricilla Riffle RN [facility] ?

## 2021-12-13 NOTE — Progress Notes (Signed)
? Patient ID: John Gallegos, male    DOB: 06-11-1946, 76 y.o.   MRN: [id number] ? ?HPI ? ?John Gallegos is a 76 y/o male with a history of HTN, COPD, gout, non-hodgkin's lymphoma, previous tobacco use and chronic heart failure.  ? ?Echo report from 12/01/21 reviewed and showed an EF of 60-65% along with severe LVH and mild LAE ? ?Admitted 11/30/21 due to acute on chronic heart failure. Cardiology consult obtained. Initially given IV lasix with transition to oral diuretics. Coreg held due to bradycardia. Hypokalemia corrected. Discharged after 3 days.  ? ?He presents today for his initial visit with a chief complaint of minimal fatigue upon moderate exertion. Describes this as chronic in nature but is "much better" than prior to his admission. He has associated edema in right leg (occasionally) and intermittent light-headedness along with this. He denies any difficulty sleeping, abdominal distention, palpitations, chest pain, shortness of breath or cough.  ? ?Has not been weighing himself as he hasn't purchased any scales yet nor a BP cuff. Says that he's going through a separation with his wife and just hasn't made the time to get them.  ? ?Did not bring his medications and says they are all the same from discharge. Is able to say the dosages of what he's taking, just not the names of them.  ? ?Drinks ~ 20 ounces of iced tea, 16-20 ounces coffee daily along with a vodka/cranberry drink each evening. Drinks occasional water during the day.  ? ?Past Medical History:  ?Diagnosis Date  ? CHF (congestive heart failure) ([place])   ? COPD (chronic obstructive pulmonary disease) ([place])   ? a. Quit smoking ~ 2018.  ? Diastolic dysfunction   ? a. 09/2017 Echo: EF 50-55%, mild RM, PASP 77mHg.  ? Gout   ? Hard of hearing   ? Hiatal hernia   ? Hypertension   ? [place] Hod[place]kin's [place]  ? B-cell, chemo tx's and in remission in 2019  ? Urinary incontinence   ? frequency  ? ?Past Surgical History:  ?Procedure Laterality  Date  ? CYSTOSCOPY W/ RETROGRADES Left 08/19/2017  ? Procedure: CYSTOSCOPY WITH RETROGRADE PYELOGRAM;  Surgeon: SAbbie Sons MD;  Location: [facility];  Service: Urology;  Laterality: Left;  ? CYSTOSCOPY W/ URETERAL STENT REMOVAL Left 08/19/2017  ? Procedure: CYSTOSCOPY WITH STENT REMOVAL;  Surgeon: SAbbie Sons MD;  Location: [facility];  Service: Urology;  Laterality: Left;  ? CYSTOSCOPY WITH BIOPSY Left 05/13/2017  ? Procedure: CYSTOSCOPY WITH URETERAL BIOPSY;  Surgeon: BNickie Retort MD;  Location: [facility];  Service: Urology;  Laterality: Left;  ? CYSTOSCOPY WITH STENT PLACEMENT Left 05/13/2017  ? Procedure: CYSTOSCOPY WITH STENT PLACEMENT;  Surgeon: BNickie Retort MD;  Location: [facility];  Service: Urology;  Laterality: Left;  ? PORTA CATH INSERTION N/A 06/14/2017  ? Procedure: PORTA CATH INSERTION;  Surgeon: DAlgernon Huxley MD;  [facility];  Service: Cardiovascular;  Laterality: N/A;  ? PORTA CATH REMOVAL N/A 02/25/2020  ? Procedure: PORTA CATH REMOVAL;  Surgeon: DAlgernon Huxley MD;  [facility];  Service: Cardiovascular;  Laterality: N/A;  ? 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 right knne surgery    ? needle went through knee  ? TONSILLECTOMY    ? URETEROSCOPY Left 05/13/2017  ? Procedure: URETEROSCOPY;  Surgeon: BNickie Retort MD;  Location: [facility];  Service: Urology;  Laterality: Left;  ? XI ROBOTIC ASSISTED  VENTRAL HERNIA N/A 05/18/2019  ? Procedure: XI ROBOTIC ASSISTED UMBILICAL HERNIA;  Surgeon: Benjamine Sprague, DO;  Location: [facility];  Service: General;  Laterality: N/A;  ? ?Family History  ?Problem Relation Age of Onset  ? Pancreatic cancer Mother   ?     died @ 45  ? Aneurysm Mother   ? Heart attack Mother   ? Lung cancer Father   ? Lymphoma Sister   ? Prostate cancer Neg Hx   ? Kidney cancer Neg Hx   ? Bladder Cancer Neg Hx   ? ?Social History  ? ?Tobacco Use  ? Smoking status: Former  ?  Packs/day: 0.50  ?  Years: 50.00  ?  Pack years: 25.00  ?   Types: Cigarettes  ?  Quit date: 12/12/2016  ?  Years since quitting: 5.0  ? Smokeless tobacco: Never  ?Substance Use Topics  ? Alcohol use: Yes  ?  Alcohol/week: 2.0 standard drinks  ?  Types: 2 Shots of liquor per week  ?  Comment: vodka and cranberry juice-one drink daily (10 oz glass)  ? ?Allergies  ?Allergen Reactions  ? Amlodipine   ?  Severe leg swelling  ? ?Prior to Admission medications   ?Medication Sig Start Date End Date Taking? Authorizing Provider  ?aspirin EC 81 MG tablet Take 81 mg by mouth daily.   Yes [provider]  ?cholecalciferol (VITAMIN D3) 25 MCG (1000 UNIT) tablet Take 1,000 Units by mouth daily.   Yes [provider]  ?doxazosin (CARDURA) 2 MG tablet TAKE 1 TABLET(2 MG) BY MOUTH TWICE DAILY 10/19/21  Yes Gollan, Kathlene November, MD  ?furosemide (LASIX) 40 MG tablet Take 1 tablet (40 mg total) by mouth daily. 12/04/21  Yes Wouk, Ailene Rud, MD  ?gabapentin (NEURONTIN) 300 MG capsule TAKE 1 CAPSULE(300 MG) BY MOUTH AT BEDTIME 09/28/21  Yes Earlie Server, MD  ?hydrALAZINE (APRESOLINE) 100 MG tablet Take 100 mg by mouth 2 (two) times daily.   Yes [provider]  ?losartan (COZAAR) 100 MG tablet Take 1 tablet (100 mg total) by mouth daily. 12/04/21  Yes Wouk, Ailene Rud, MD  ?meloxicam (MOBIC) 7.5 MG tablet Take 7.5 mg by mouth daily as needed. 06/14/21  Yes [provider]  ?potassium chloride SA (KLOR-CON M) 20 MEQ tablet Take 1 tablet (20 mEq total) by mouth daily. Monday, Wednesday and Friday. 12/03/21  Yes Wouk, Ailene Rud, MD  ?pravastatin (PRAVACHOL) 40 MG tablet Take 1 tablet (40 mg total) by mouth at bedtime. 08/18/21  Yes Minna Merritts, MD  ?spironolactone (ALDACTONE) 25 MG tablet Take 1 tablet (25 mg total) by mouth daily. 12/04/21  Yes Wouk, Ailene Rud, MD  ?vitamin B-12 (CYANOCOBALAMIN) 100 MCG tablet Take 100 mcg by mouth daily.   Yes [provider]  ?vitamin C (ASCORBIC ACID) 500 MG tablet Take 500 mg by mouth daily.   Yes [provider]  ?zolpidem (AMBIEN) 10 MG tablet Take 10 mg by mouth at bedtime. 02/10/17  Yes [provider]  ? ?Review of Systems  ?Constitutional:  Positive for fatigue (varies). Negative for appetite change.  ?HENT:  Negative for congestion, postnasal drip and sore throat.   ?Eyes: Negative.   ?Respiratory:  Negative for cough and shortness of breath.   ?Cardiovascular:  Positive for leg swelling ("sometimes in right leg"). Negative for chest pain and palpitations.  ?Gastrointestinal:  Negative for abdominal distention and abdominal pain.  ?Endocrine: Negative.   ?Genitourinary: Negative.   ?Musculoskeletal:  Negative  for back pain and neck pain.  ?Skin: Negative.   ?Allergic/Immunologic: Negative.   ?Neurological:  Positive for light-headedness (on occasion). Negative for dizziness.  ?Hematological:  Negative for adenopathy. Does not bruise/bleed easily.  ?Psychiatric/Behavioral:  Negative for dysphoric mood and sleep disturbance (sleeping on 1 pillow). The patient is not nervous/anxious.   ? ?Vitals:  ? 12/14/21 0837  ?BP: (!) 156/67  ?Pulse: (!) 58  ?Resp: 20  ?SpO2: 98%  ?Weight: 229 lb (103.9 kg)  ?Height: '6\' 1"'$  (1.854 m)  ? ?Wt Readings from Last 3 Encounters:  ?12/14/21 229 lb (103.9 kg)  ?12/03/21 221 lb 3.2 oz (100.3 kg)  ?08/21/21 230 lb 4.8 oz (104.5 kg)  ? ?Lab Results  ?Component Value Date  ? CREATININE 0.88 12/03/2021  ? CREATININE 1.02 12/02/2021  ? CREATININE 0.99 12/01/2021  ? ?Physical Exam ?Vitals and nursing note reviewed.  ?Constitutional:   ?   Appearance: Normal appearance.  ?HENT:  ?   Head: Normocephalic and atraumatic.  ?Cardiovascular:  ?   Rate and Rhythm: Regular rhythm. Bradycardia present.  ?Pulmonary:  ?   Effort: Pulmonary effort is normal. No respiratory distress.  ?   Breath sounds: No wheezing or rales.  ?Abdominal:  ?   General: There is no distension.  ?   Palpations: Abdomen is soft.  ?Musculoskeletal:     ?   General: No tenderness.  ?   Cervical back: Normal range  of motion and neck supple.  ?   Right lower leg: No edema.  ?   Left lower leg: No edema.  ?Skin: ?   General: Skin is warm and dry.  ?Neurological:  ?   General: No focal deficit present.  ?   Mental Status: He

## 2021-12-14 ENCOUNTER — Encounter: Payer: Self-pay | Admitting: Family

## 2021-12-14 ENCOUNTER — Ambulatory Visit: Payer: Medicare HMO | Attending: Family | Admitting: Family

## 2021-12-14 VITALS — BP 156/67 | HR 58 | Resp 20 | Ht 73.0 in | Wt 229.0 lb

## 2021-12-14 DIAGNOSIS — C8590 Non-Hodgkin lymphoma, unspecified, unspecified site: Secondary | ICD-10-CM | POA: Insufficient documentation

## 2021-12-14 DIAGNOSIS — I10 Essential (primary) hypertension: Secondary | ICD-10-CM

## 2021-12-14 DIAGNOSIS — C8518 Unspecified B-cell lymphoma, lymph nodes of multiple sites: Secondary | ICD-10-CM | POA: Diagnosis not present

## 2021-12-14 DIAGNOSIS — Z7982 Long term (current) use of aspirin: Secondary | ICD-10-CM | POA: Diagnosis not present

## 2021-12-14 DIAGNOSIS — I110 Hypertensive heart disease with heart failure: Secondary | ICD-10-CM | POA: Diagnosis not present

## 2021-12-14 DIAGNOSIS — J449 Chronic obstructive pulmonary disease, unspecified: Secondary | ICD-10-CM | POA: Diagnosis not present

## 2021-12-14 DIAGNOSIS — Z79899 Other long term (current) drug therapy: Secondary | ICD-10-CM | POA: Insufficient documentation

## 2021-12-14 DIAGNOSIS — Z87891 Personal history of nicotine dependence: Secondary | ICD-10-CM | POA: Insufficient documentation

## 2021-12-14 DIAGNOSIS — Z791 Long term (current) use of non-steroidal anti-inflammatories (NSAID): Secondary | ICD-10-CM | POA: Insufficient documentation

## 2021-12-14 DIAGNOSIS — I5032 Chronic diastolic (congestive) heart failure: Secondary | ICD-10-CM | POA: Insufficient documentation

## 2021-12-14 NOTE — Patient Instructions (Addendum)
Begin weighing daily and call for an overnight weight gain of 3 pounds or more or a weekly weight gain of more than 5 pounds.   If you have voicemail, please make sure your mailbox is cleaned out so that we may leave a message and please make sure to listen to any voicemails.     

## 2021-12-15 NOTE — Progress Notes (Deleted)
Cardiology Office Note    Date:  12/15/2021   ID:  John Gallegos, John Gallegos May 10, 1946, MRN [id number]  PCP:  Tracie Harrier, MD  Cardiologist:  Ida Rogue, MD  Electrophysiologist:  None   Chief Complaint: Hospital follow-up  History of Present Illness:   John Gallegos is a 75 y.o. male with history of coronary calcification by CT imaging, diastolic dysfunction, non-Hodgkin's lymphoma status postchemotherapy, HTN, remote tobacco use, and gout who presents for hospital follow-up as outlined below.  Prior echo in 2019 demonstrated an EF of 50 to 55% with a PASP of 37 mmHg.  He was admitted to the hospital from 4/24 through 4/27 with a 1 month history of progressive dyspnea and lower extremity swelling in the context of hypertensive urgency with a BP of 209/122 (had forgotten to take his medications today prior).  EKG showed sinus bradycardia with inferior T wave inversion and QTc of 511 ms.  High-sensitivity troponin peaked at 68.  BNP 873.  Chest x-ray showed cardiomegaly with mild interstitial edema and small bilateral pleural effusions.  Symptoms improved with diuresis and BP control.  Echo during the admission demonstrated an EF of 60 to 65%, severe asymmetric LVH of the septal segment, grade 2 diastolic dysfunction, normal RV systolic function, mild biatrial enlargement, and no significant valvular abnormalities.  ***   Labs independently reviewed: 11/2021 - potassium 3.5, BUN 28, serum creatinine 0.88, magnesium 2.1, Hgb 13.0, PLT 200, albumin 3.6, AST/ALT normal 08/2021 - TC 180, TG 81, HDL 51, LDL 112, TSH normal 04/2021 - A1c 5.5  Past Medical History:  Diagnosis Date   CHF (congestive heart failure) ([facility])    COPD (chronic obstructive pulmonary disease) ([place])    a. Quit smoking ~ 2018.   Diastolic dysfunction    a. 09/2017 Echo: EF 50-55%, mild RM, PASP 20mHg.   Gout    Hard of hearing    Hiatal hernia    Hypertension    [place]    B-cell, chemo tx's and in remission in 2019   Urinary incontinence    frequency    Past Surgical History:  Procedure Laterality Date   CYSTOSCOPY W/ RETROGRADES Left 08/19/2017   Procedure: CYSTOSCOPY WITH RETROGRADE PYELOGRAM;  Surgeon: SAbbie Sons MD;  Location: [facility];  Service: Urology;  Laterality: Left;   CYSTOSCOPY W/ URETERAL STENT REMOVAL Left 08/19/2017   Procedure: CYSTOSCOPY WITH STENT REMOVAL;  Surgeon: SAbbie Sons MD;  Location: [facility];  Service: Urology;  Laterality: Left;   CYSTOSCOPY WITH BIOPSY Left 05/13/2017   Procedure: CYSTOSCOPY WITH URETERAL BIOPSY;  Surgeon: BNickie Retort MD;  Location: [facility];  Service: Urology;  Laterality: Left;   CYSTOSCOPY WITH STENT PLACEMENT Left 05/13/2017   Procedure: CYSTOSCOPY WITH STENT PLACEMENT;  Surgeon: BNickie Retort MD;  Location: [facility];  Service: Urology;  Laterality: Left;   PORTA CATH INSERTION N/A 06/14/2017   Procedure: PORTA CATH INSERTION;  Surgeon: DAlgernon Huxley MD;  [facility];  Service: Cardiovascular;  Laterality: N/A;   PORTA CATH REMOVAL N/A 02/25/2020   Procedure: PORTA CATH REMOVAL;  Surgeon: DAlgernon Huxley MD;  [facility];  Service: Cardiovascular;  Laterality: N/A;   pyleostenosis     180weeks old   right knne surgery     needle went through knee   TONSILLECTOMY     URETEROSCOPY Left 05/13/2017   Procedure: URETEROSCOPY;  Surgeon: BNickie Retort MD;  Location: [facility];  Service: Urology;  Laterality: Left;   XI ROBOTIC ASSISTED VENTRAL HERNIA N/A 05/18/2019   Procedure: XI ROBOTIC ASSISTED UMBILICAL HERNIA;  Surgeon: Benjamine Sprague, DO;  Location: [facility];  Service: General;  Laterality: N/A;    Current Medications: No outpatient medications have been marked as taking for the 12/16/21 encounter (Appointment) with Rise Mu, PA-C.    Allergies:   Amlodipine   Social History   Socioeconomic History   Marital status: Legally  Separated    Spouse name: Not on file   Number of children: Not on file   Years of education: Not on file   Highest education level: Not on file  Occupational History   Not on file  Tobacco Use   Smoking status: Former    Packs/day: 0.50    Years: 50.00    Pack years: 25.00    Types: Cigarettes    Quit date: 12/12/2016    Years since quitting: 5.0   Smokeless tobacco: Never  Vaping Use   Vaping Use: Never used  Substance and Sexual Activity   Alcohol use: Yes    Alcohol/week: 2.0 standard drinks    Types: 2 Shots of liquor per week    Comment: vodka and cranberry juice-one drink daily (10 oz glass)   Drug use: No   Sexual activity: Not on file  Other Topics Concern   Not on file  Social History Narrative   Lives locally in apartment.  Divorced but he and his ex-wife are close friends and they live in the same apt complex.  He does not routinely exercise.   Social Determinants of Health   Financial Resource Strain: Not on file  Food Insecurity: Not on file  Transportation Needs: Not on file  Physical Activity: Not on file  Stress: Not on file  Social Connections: Not on file     Family History:  The patient's family history includes Aneurysm in his mother; Heart attack in his mother; Lung cancer in his father; Lymphoma in his sister; Pancreatic cancer in his mother. There is no history of Prostate cancer, Kidney cancer, or Bladder Cancer.  ROS:   ROS   EKGs/Labs/Other Studies Reviewed:    Studies reviewed were summarized above. The additional studies were reviewed today:  2D echo 12/01/2021: 1. Left ventricular ejection fraction, by estimation, is 60 to 65%. The  left ventricle has normal function. Left ventricular endocardial border  not optimally defined to evaluate regional wall motion. There is severe  asymmetric left ventricular  hypertrophy of the septal segment. Left ventricular diastolic parameters  are consistent with Grade II diastolic dysfunction  (pseudonormalization).  Elevated left atrial pressure.   2. Right ventricular systolic function is normal. The right ventricular  size is not well visualized. Tricuspid regurgitation signal is inadequate  for assessing PA pressure.   3. Left atrial size was mildly dilated.   4. Right atrial size was mildly dilated.   5. The mitral valve was not well visualized. No evidence of mitral valve  regurgitation. No evidence of mitral stenosis.   6. The aortic valve was not well visualized. Aortic valve regurgitation  is not visualized. No aortic stenosis is present. __________  2D echo 09/20/2017: - Left ventricle: The cavity size was normal. Systolic function was    normal. The estimated ejection fraction was in the range of 50%    to 55%.  - Aortic valve: Valve area (VTI): 3.22 cm^2. Valve area (Vmax):    2.91 cm^2.  Valve area (Vmean): 3.31 cm^2.  - Mitral valve: There was mild regurgitation. Valve area by    continuity equation (using LVOT flow): 3.1 cm^2.  - Pulmonary arteries: PA peak pressure: 37 mm Hg (S). __________  2D echo 06/07/2017: - Technically difficult study due to chest wall and/or lung    interference.  - Left ventricle: The cavity size was normal. Wall thickness was    increased in a pattern of mild LVH. Systolic function was normal.    The estimated ejection fraction was in the range of 60% to 65%.    Doppler parameters are consistent with abnormal left ventricular    relaxation (grade 1 diastolic dysfunction).  - Left atrium: The atrium was mildly dilated.  - Right ventricle: The cavity size was normal. Systolic function    was normal.  - Right atrium: The atrium was mildly dilated.   EKG:  EKG is ordered today.  The EKG ordered today demonstrates ***  Recent Labs: 11/30/2021: ALT 12; B Natriuretic Peptide 873.1 12/01/2021: Hemoglobin 13.0; Magnesium 2.1; Platelets 200 12/03/2021: BUN 28; Creatinine, Ser 0.88; Potassium 3.5; Sodium 139  Recent Lipid Panel No  results found for: CHOL, TRIG, HDL, CHOLHDL, VLDL, LDLCALC, LDLDIRECT  PHYSICAL EXAM:    VS:  There were no vitals taken for this visit.  BMI: There is no height or weight on file to calculate BMI.  Physical Exam  Wt Readings from Last 3 Encounters:  12/14/21 229 lb (103.9 kg)  12/03/21 221 lb 3.2 oz (100.3 kg)  08/21/21 230 lb 4.8 oz (104.5 kg)     ASSESSMENT & PLAN:   HFpEF:  Coronary calcification with demand ischemia/HLD: ***.  LDL 112.  HTN: Blood pressure ***  Prolonged QTc:  Sinus bradycardia:   {Are you ordering a CV Procedure (e.g. stress test, cath, DCCV, TEE, etc)?   Press F2        :[id number]}     Disposition: F/u with Dr. Rockey Situ or an APP in ***.   Medication Adjustments/Labs and Tests Ordered: Current medicines are reviewed at length with the patient today.  Concerns regarding medicines are outlined above. Medication changes, Labs and Tests ordered today are summarized above and listed in the Patient Instructions accessible in Encounters.   Signed, Christell Faith, PA-C 12/15/2021 7:13 AM     [address] [place], [place] [address] [phone]

## 2021-12-16 ENCOUNTER — Ambulatory Visit: Payer: Medicare HMO | Admitting: Physician Assistant

## 2021-12-17 ENCOUNTER — Encounter: Payer: Self-pay | Admitting: Physician Assistant

## 2021-12-23 DIAGNOSIS — Z0000 Encounter for general adult medical examination without abnormal findings: Secondary | ICD-10-CM | POA: Diagnosis not present

## 2021-12-23 DIAGNOSIS — H9190 Unspecified hearing loss, unspecified ear: Secondary | ICD-10-CM | POA: Diagnosis not present

## 2021-12-23 DIAGNOSIS — C8339 Diffuse large B-cell lymphoma, extranodal and solid organ sites: Secondary | ICD-10-CM | POA: Diagnosis not present

## 2021-12-23 DIAGNOSIS — G4709 Other insomnia: Secondary | ICD-10-CM | POA: Diagnosis not present

## 2021-12-23 DIAGNOSIS — I10 Essential (primary) hypertension: Secondary | ICD-10-CM | POA: Diagnosis not present

## 2021-12-23 DIAGNOSIS — Z125 Encounter for screening for malignant neoplasm of prostate: Secondary | ICD-10-CM | POA: Diagnosis not present

## 2021-12-23 DIAGNOSIS — E876 Hypokalemia: Secondary | ICD-10-CM | POA: Diagnosis not present

## 2021-12-30 DIAGNOSIS — H9190 Unspecified hearing loss, unspecified ear: Secondary | ICD-10-CM | POA: Diagnosis not present

## 2021-12-30 DIAGNOSIS — Z23 Encounter for immunization: Secondary | ICD-10-CM | POA: Diagnosis not present

## 2021-12-30 DIAGNOSIS — C8330 Diffuse large B-cell lymphoma, unspecified site: Secondary | ICD-10-CM | POA: Diagnosis not present

## 2021-12-30 DIAGNOSIS — I110 Hypertensive heart disease with heart failure: Secondary | ICD-10-CM | POA: Diagnosis not present

## 2021-12-30 DIAGNOSIS — Z6831 Body mass index (BMI) 31.0-31.9, adult: Secondary | ICD-10-CM | POA: Diagnosis not present

## 2021-12-30 DIAGNOSIS — I5032 Chronic diastolic (congestive) heart failure: Secondary | ICD-10-CM | POA: Diagnosis not present

## 2021-12-30 DIAGNOSIS — M1711 Unilateral primary osteoarthritis, right knee: Secondary | ICD-10-CM | POA: Diagnosis not present

## 2021-12-30 DIAGNOSIS — R2689 Other abnormalities of gait and mobility: Secondary | ICD-10-CM | POA: Diagnosis not present

## 2021-12-30 DIAGNOSIS — E785 Hyperlipidemia, unspecified: Secondary | ICD-10-CM | POA: Diagnosis not present

## 2022-01-25 DIAGNOSIS — E876 Hypokalemia: Secondary | ICD-10-CM | POA: Diagnosis not present

## 2022-01-25 DIAGNOSIS — I10 Essential (primary) hypertension: Secondary | ICD-10-CM | POA: Diagnosis not present

## 2022-01-25 DIAGNOSIS — I700 Atherosclerosis of aorta: Secondary | ICD-10-CM | POA: Diagnosis not present

## 2022-01-27 ENCOUNTER — Encounter: Payer: Self-pay | Admitting: Oncology

## 2022-02-18 ENCOUNTER — Other Ambulatory Visit: Payer: Medicare HMO

## 2022-02-18 ENCOUNTER — Ambulatory Visit: Payer: Medicare HMO | Admitting: Oncology

## 2022-02-25 ENCOUNTER — Encounter: Payer: Self-pay | Admitting: Oncology

## 2022-02-25 ENCOUNTER — Inpatient Hospital Stay: Payer: Medicare HMO | Attending: Oncology

## 2022-02-25 ENCOUNTER — Inpatient Hospital Stay: Payer: Medicare HMO | Admitting: Oncology

## 2022-02-25 VITALS — BP 139/94 | HR 47 | Temp 97.6°F | Resp 18 | Wt 229.7 lb

## 2022-02-25 DIAGNOSIS — C8339 Diffuse large B-cell lymphoma, extranodal and solid organ sites: Secondary | ICD-10-CM

## 2022-02-25 DIAGNOSIS — Z789 Other specified health status: Secondary | ICD-10-CM

## 2022-02-25 DIAGNOSIS — Z9221 Personal history of antineoplastic chemotherapy: Secondary | ICD-10-CM | POA: Diagnosis not present

## 2022-02-25 DIAGNOSIS — G629 Polyneuropathy, unspecified: Secondary | ICD-10-CM | POA: Diagnosis not present

## 2022-02-25 DIAGNOSIS — Z8572 Personal history of non-Hodgkin lymphomas: Secondary | ICD-10-CM | POA: Insufficient documentation

## 2022-02-25 DIAGNOSIS — D696 Thrombocytopenia, unspecified: Secondary | ICD-10-CM

## 2022-02-25 LAB — URINALYSIS, COMPLETE (UACMP) WITH MICROSCOPIC
Bacteria, UA: NONE SEEN
Bilirubin Urine: NEGATIVE
Glucose, UA: NEGATIVE mg/dL
Hgb urine dipstick: NEGATIVE
Ketones, ur: NEGATIVE mg/dL
Leukocytes,Ua: NEGATIVE
Nitrite: NEGATIVE
Protein, ur: NEGATIVE mg/dL
Specific Gravity, Urine: 1.003 — ABNORMAL LOW (ref 1.005–1.030)
Squamous Epithelial / HPF: NONE SEEN (ref 0–5)
pH: 7 (ref 5.0–8.0)

## 2022-02-25 LAB — CBC WITH DIFFERENTIAL/PLATELET
Abs Immature Granulocytes: 0.02 10*3/uL (ref 0.00–0.07)
Basophils Absolute: 0 10*3/uL (ref 0.0–0.1)
Basophils Relative: 1 %
Eosinophils Absolute: 0.2 10*3/uL (ref 0.0–0.5)
Eosinophils Relative: 3 %
HCT: 40.5 % (ref 39.0–52.0)
Hemoglobin: 13.7 g/dL (ref 13.0–17.0)
Immature Granulocytes: 0 %
Lymphocytes Relative: 13 %
Lymphs Abs: 0.8 10*3/uL (ref 0.7–4.0)
MCH: 30.6 pg (ref 26.0–34.0)
MCHC: 33.8 g/dL (ref 30.0–36.0)
MCV: 90.6 fL (ref 80.0–100.0)
Monocytes Absolute: 0.5 10*3/uL (ref 0.1–1.0)
Monocytes Relative: 7 %
Neutro Abs: 4.7 10*3/uL (ref 1.7–7.7)
Neutrophils Relative %: 76 %
Platelets: 156 10*3/uL (ref 150–400)
RBC: 4.47 MIL/uL (ref 4.22–5.81)
RDW: 13 % (ref 11.5–15.5)
WBC: 6.2 10*3/uL (ref 4.0–10.5)
nRBC: 0 % (ref 0.0–0.2)

## 2022-02-25 LAB — COMPREHENSIVE METABOLIC PANEL
ALT: 11 U/L (ref 0–44)
AST: 16 U/L (ref 15–41)
Albumin: 3.5 g/dL (ref 3.5–5.0)
Alkaline Phosphatase: 85 U/L (ref 38–126)
Anion gap: 5 (ref 5–15)
BUN: 13 mg/dL (ref 8–23)
CO2: 23 mmol/L (ref 22–32)
Calcium: 8.8 mg/dL — ABNORMAL LOW (ref 8.9–10.3)
Chloride: 110 mmol/L (ref 98–111)
Creatinine, Ser: 0.87 mg/dL (ref 0.61–1.24)
GFR, Estimated: >60 mL/min (ref >60)
Glucose, Bld: 107 mg/dL — ABNORMAL HIGH (ref 70–99)
Potassium: 4 mmol/L (ref 3.5–5.1)
Sodium: 138 mmol/L (ref 135–145)
Total Bilirubin: 0.7 mg/dL (ref 0.3–1.2)
Total Protein: 7.2 g/dL (ref 6.5–8.1)

## 2022-02-25 LAB — VITAMIN B12: Vitamin B-12: 170 pg/mL — ABNORMAL LOW (ref 180–914)

## 2022-02-25 LAB — LACTATE DEHYDROGENASE: LDH: 120 U/L (ref 98–192)

## 2022-02-25 LAB — FOLATE: Folate: 8.5 ng/mL (ref >5.9)

## 2022-02-25 NOTE — Progress Notes (Signed)
Pt here for follow up. No new concerns voiced.   

## 2022-02-25 NOTE — Progress Notes (Signed)
Hematology/Oncology follow-up note  [phone] [phone]   Patient Care Team: Tracie Harrier, MD as PCP - General (Internal Medicine) Minna Merritts, MD as PCP - Cardiology (Cardiology) Earlie Server, MD as Consulting Physician (Oncology) Lucky Cowboy Erskine Squibb, MD as Referring Physician (Vascular Surgery) Abbie Sons, MD (Urology)  REFERRING PROVIDER: Tracie Harrier, MD  REASON FOR VISIT Follow up for surveillance for diffuse large B-cell lymphoma.  HISTORY OF PRESENTING ILLNESS:  John Gallegos is a  76 y.o.  male with PMH listed below presented for follow-up for treatment of diffuse large B cell lymphoma.  Pertinent history of oncology workup includes: Patient was found to have microscopic hematuria on 02/23/2017 with 4-10 RBC's/hpf.    Urine culture was negative.having symptoms of nocturia  He is smoker. 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 He was in the [employer] and reports to be exposed to [place] in [place]. CT scan 04/20/2017 which revealed moderate left hydronephrosis and delayed excretion down to a large 4.4 cm mass in or around the left proximal ureter. There was also hazy soft tissue in the fat surrounding the celiac axis and superior mesenteric artery. Thickening of the right diaphragmatic crus. High gastrohepatic ligament lymphadenopathy measures up to 11 mm. Peripancreatic and root of small bowel mesentery adenopathy measures up to 2.6 cm.  He was seen by urologist Dr. Pilar Jarvis and he underwent cystoscopy, left retrograde pyelogram, left ureteroscopy and placement of left ureteral stent placement on 05/13/2017. Finding during the procedure that he has extrinsic compression of the ureter, possibly from lymphoma.  # Pathology:  Biopsy of paraspinal musculature at L2. Pathology results reviewed large B cell lymphoma. It is CD20 positive, CD10 positive. Flow cytometry shows a small clonal population of large B cells positive for CD10 and  absent cop and lambda light chains. The abnormal cells represent 2% of the total cells.The morphology, initial IHC panel, and flow cytometry supports classification as B-cell lymphoma, CD10 positive, with large cell features.diffuse large B-cell lymphoma, germinal center B-cell type. Discussed with pathologist Dr.Onley, Ki67 50%, MUM1 negative, MYC negative, Cycline D1 negative, CD30 negative, FISH is positive for BCL2 gene rearrangement. Negative for MYC and BCL6. 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   # Bone marrow Biopsy: negative for lymphoma involvement.  # PET scan 05/18/2017  IMPRESSION: 1. Multifocal metastatic disease within the LEFT retroperitoneum, central mesenteries, and RIGHT paraspinal musculature. 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 Recommend percutaneous biopsy of the RIGHT paraspinal musculature at L2. 2. 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 # Interim PET scan 07/28/2017 after 2 cycles of RCHOP 1. Marked improvement, with row solution of the prior bony lesions and significant reduction in activity of the central mesenteric and right gastric adenopathy. Central mesenteric nodal is currently Deauville 3, previously Deauville 4 and Deauville 5. 2. Improvement in the right paraspinal muscular activity, currently Deauville 3, previously Deauville 4. I also discussed with radiologist that there has been further improvement in the left periureteral density, likely reflecting treatment response.   # Prognostic Model for the risk of CNS lymphoma involvement per NCCN guideline,  Patient is intermediate risk with score of 2, one from being >60, and one from more than extranodal involvement>1 sites (bone, paraspinal muscle involvement). CNS prophylaxis is usually recommended if score 4-6.  CNS prophylaxis was not offered.    #08/04/2018 CT abdomen pelvis showed no evidence of acute abnormality.  Stable  stranding haziness at the root of mesentery and retroperitoneum.  No new or enlarged lymph nodes identified.  Small to moderate umbilical hernia containing fat, moderate right lower lobe atelectasis and a small loculated right pleural effusion again noted.  Cholelithiasis without CT evidence of acute cholecystitis.  Prostate enlargement.  Aortic atherosclerosis.    # Treatment:  06/21/2017 Cycle 1 RCHOP [name]. Dexamethasone 48m on Day 1,  Prednisone 80 mg daily Day 2-5. 07/12/2017 Cycle 2 RCHOP /[name].Dexamethasone 212mon Day 1, Prednisone 80 mg daily Day 2-5 08/03/2017 Cycle 3 RCHOP/onpro.Dexamethasone 2093mn Day 1, Prednisone 80 mg daily Day 2-5  08/24/2017 Cycle 4 RCHOP/onpro.Dexamethasone 61m70m Day 1, Prednisone 80 mg daily Day 2-5  09/14/2017 Cycle 5 RCHOP/onpro.Dexamethasone 61mg68mDay 1, Prednisone 80 mg daily Day 2-5  10/05/2017 Cycle 6 RCHOP/onpro.Dexamethasone 61mg 81may 1, Prednisone 80 mg daily Day 2-5   # Chronic back pain, previous MRI showed diffuse lumbar spine spondylosis. INTERVAL HISTORY 76 y.o82male with oncology history listed as above reviewed by me today presents for follow-up for diffuse large B-cell lymphoma. Denies weight loss, fever, chills, fatigue, night sweats.  He has no new complaints. He drinks alcohol daily.  During the interval he was admitted due to acute CHF exacerbation, hypertensive urgency. He take diuretics. No SOB lower extremity swelling today. Chronic back pain.  Review of Systems  Constitutional:  Positive for fatigue. Negative for appetite change, chills, fever and unexpected weight change.  HENT:   Negative for hearing loss and voice change.   Eyes:  Negative for eye problems and icterus.  Respiratory:  Negative for chest tightness, cough and shortness of breath.   Cardiovascular:  Negative for chest pain and leg swelling.  Gastrointestinal:  Negative for abdominal distention and abdominal pain.  Endocrine: Negative for hot flashes.   Genitourinary:  Negative for difficulty urinating, dysuria and frequency.   Musculoskeletal:  Positive for back pain and gait problem. Negative for arthralgias.  Skin:  Negative for itching and rash.  Neurological:  Positive for gait problem. Negative for light-headedness and numbness.  Hematological:  Negative for adenopathy. Does not bruise/bleed easily.  Psychiatric/Behavioral:  Negative for confusion.      MEDICAL HISTORY:  Past Medical History:  Diagnosis Date   CHF (congestive heart failure) (HCC)    COPD (chronic obstructive pulmonary disease) (HCC)  [place]. Quit smoking ~ 2018.   Diastolic dysfunction    a. 09/2017 Echo: EF 50-55%, mild RM, PASP 37mmHg29mGout    Hard of hearing    Hiatal hernia    Hypertension    Non Hodgkin's lymphoma (HCC) [id number]   B-cell, chemo tx's and in remission in 2019   Urinary incontinence    frequency    SURGICAL HISTORY: Past Surgical History:  Procedure Laterality Date   CYSTOSCOPY W/ RETROGRADES Left 08/19/2017   Procedure: CYSTOSCOPY WITH RETROGRADE PYELOGRAM;  Surgeon: StoioffAbbie Sons[facility];  Service: Urology;  Laterality: Left;   CYSTOSCOPY W/ URETERAL STENT REMOVAL Left 08/19/2017   Procedure: CYSTOSCOPY WITH STENT REMOVAL;  Surgeon: StoioffAbbie SonsLocation: [facility];  Service: Urology;  Laterality: Left;   CYSTOSCOPY WITH BIOPSY Left 05/13/2017   Procedure: CYSTOSCOPY WITH URETERAL BIOPSY;  Surgeon: Budzyn,Nickie RetortLocation: [facility];  Service: Urology;  Laterality: Left;   CYSTOSCOPY WITH STENT PLACEMENT Left 05/13/2017   Procedure: CYSTOSCOPY WITH STENT PLACEMENT;  Surgeon: Budzyn,Nickie RetortLocation: [facility];  Service: Urology;  Laterality: Left;   PORTA CATH INSERTION N/A 06/14/2017   Procedure: PORTA CATH INSERTION;  Surgeon: Algernon Huxley, MD;  Location: [place] [facility];  Service: Cardiovascular;  Laterality: N/A;   PORTA CATH REMOVAL N/A 02/25/2020   Procedure: PORTA CATH REMOVAL;   Surgeon: Algernon Huxley, MD;  Location: [place] [facility];  Service: Cardiovascular;  Laterality: N/A;   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   right knne surgery     needle went through knee   TONSILLECTOMY     URETEROSCOPY Left 05/13/2017   Procedure: URETEROSCOPY;  Surgeon: Nickie Retort, MD;  Location: [facility];  Service: Urology;  Laterality: Left;   XI ROBOTIC ASSISTED VENTRAL HERNIA N/A 05/18/2019   Procedure: XI ROBOTIC ASSISTED UMBILICAL HERNIA;  Surgeon: Benjamine Sprague, DO;  Location: [facility];  Service: General;  Laterality: N/A;    SOCIAL HISTORY: Social History   Socioeconomic History   Marital status: Legally Separated    Spouse name: Not on file   Number of children: Not on file   Years of education: Not on file   Highest education level: Not on file  Occupational History   Not on file  Tobacco Use   Smoking status: Former    Packs/day: 0.50    Years: 50.00    Total pack years: 25.00    Types: Cigarettes    Quit date: 12/12/2016    Years since quitting: 5.2   Smokeless tobacco: Never  Vaping Use   Vaping Use: Never used  Substance and Sexual Activity   Alcohol use: Yes    Alcohol/week: 2.0 standard drinks of alcohol    Types: 2 Shots of liquor per week    Comment: vodka and cranberry juice-one drink daily (10 oz glass)   Drug use: No   Sexual activity: Not on file  Other Topics Concern   Not on file  Social History Narrative   Lives locally in apartment.  Divorced but he and his ex-wife are close friends and they live in the same apt complex.  He does not routinely exercise.   Social Determinants of Health   Financial Resource Strain: Not on file  Food Insecurity: Not on file  Transportation Needs: Not on file  Physical Activity: Not on file  Stress: Not on file  Social Connections: Not on file  Intimate Partner Violence: Not on file    FAMILY HISTORY: Family History  Problem Relation Age of Onset   Pancreatic cancer Mother        died @ 46    Aneurysm Mother    Heart attack Mother    Lung cancer Father    Lymphoma Sister    Prostate cancer Neg Hx    Kidney cancer Neg Hx    Bladder Cancer Neg Hx     ALLERGIES:  is allergic to amlodipine.  MEDICATIONS:  Current Outpatient Medications  Medication Sig Dispense Refill   aspirin EC 81 MG tablet Take 81 mg by mouth daily.     cholecalciferol (VITAMIN D3) 25 MCG (1000 UNIT) tablet Take 1,000 Units by mouth daily.     doxazosin (CARDURA) 2 MG tablet TAKE 1 TABLET(2 MG) BY MOUTH TWICE DAILY 180 tablet 0   furosemide (LASIX) 40 MG tablet Take 1 tablet (40 mg total) by mouth daily. 30 tablet 1   gabapentin (NEURONTIN) 300 MG capsule TAKE 1 CAPSULE(300 MG) BY MOUTH AT BEDTIME 90 capsule 1   hydrALAZINE (APRESOLINE) 100 MG  tablet Take 100 mg by mouth 2 (two) times daily.     losartan (COZAAR) 100 MG tablet Take 1 tablet (100 mg total) by mouth daily. 30 tablet 1   meloxicam (MOBIC) 7.5 MG tablet Take 7.5 mg by mouth daily as needed.     potassium chloride SA (KLOR-CON M) 20 MEQ tablet Take 1 tablet (20 mEq total) by mouth daily. Monday, Wednesday and Friday. 30 tablet 1   pravastatin (PRAVACHOL) 40 MG tablet Take 1 tablet (40 mg total) by mouth at bedtime. 90 tablet 3   spironolactone (ALDACTONE) 25 MG tablet Take 1 tablet (25 mg total) by mouth daily. 30 tablet 1   vitamin B-12 (CYANOCOBALAMIN) 100 MCG tablet Take 100 mcg by mouth daily.     vitamin C (ASCORBIC ACID) 500 MG tablet Take 500 mg by mouth daily.     zolpidem (AMBIEN) 10 MG tablet Take 10 mg by mouth at bedtime.     No current facility-administered medications for this visit.      Marland Kitchen  PHYSICAL EXAMINATION: ECOG PERFORMANCE STATUS: 1 - Symptomatic but completely ambulatory Vitals:   02/25/22 1103  BP: (!) 139/94  Pulse: (!) 47  Resp: 18  Temp: 97.6 F (36.4 C)   Filed Weights   02/25/22 1103  Weight: 229 lb 11.2 oz (104.2 kg)    Physical Exam Constitutional:      General: He is not in acute distress.     Appearance: He is not diaphoretic.  HENT:     Head: Normocephalic and atraumatic.     Nose: Nose normal.     Mouth/Throat:     Pharynx: No oropharyngeal exudate.  Eyes:     General: No scleral icterus.    Pupils: Pupils are equal, round, and reactive to light.  Cardiovascular:     Rate and Rhythm: Normal rate and regular rhythm.     Heart sounds: No murmur heard. Pulmonary:     Effort: Pulmonary effort is normal. No respiratory distress.     Breath sounds: No rales.  Chest:     Chest wall: No tenderness.  Abdominal:     General: There is no distension.     Palpations: Abdomen is soft.     Tenderness: There is no abdominal tenderness.  Musculoskeletal:        General: Normal range of motion.     Cervical back: Normal range of motion and neck supple.  Skin:    General: Skin is warm and dry.     Findings: No erythema.  Neurological:     Mental Status: He is alert and oriented to person, place, and time.     Cranial Nerves: No cranial nerve deficit.     Motor: No abnormal muscle tone.     Coordination: Coordination normal.  Psychiatric:        Mood and Affect: Affect normal.     LABORATORY DATA:  I have reviewed the data as listed    Latest Ref Rng & Units 02/25/2022   10:50 AM 12/01/2021    3:56 AM 11/30/2021    8:19 PM  CBC  WBC 4.0 - 10.5 K/uL 6.2  6.6  7.9   Hemoglobin 13.0 - 17.0 g/dL 13.7  13.0  13.2   Hematocrit 39.0 - 52.0 % 40.5  39.3  41.0   Platelets 150 - 400 K/uL 156  200  206        Latest Ref Rng & Units 02/25/2022   10:50 AM 12/03/2021  5:30 AM 12/02/2021    5:41 AM  CMP  Glucose 70 - 99 mg/dL 107  95  103   BUN 8 - 23 mg/dL '13  28  31   ' Creatinine 0.61 - 1.24 mg/dL 0.87  0.88  1.02   Sodium 135 - 145 mmol/L 138  139  140   Potassium 3.5 - 5.1 mmol/L 4.0  3.5  3.3   Chloride 98 - 111 mmol/L 110  107  107   CO2 22 - 32 mmol/L '23  27  28   ' Calcium 8.9 - 10.3 mg/dL 8.8  8.9  8.7   Total Protein 6.5 - 8.1 g/dL 7.2     Total Bilirubin 0.3 - 1.2  mg/dL 0.7     Alkaline Phos 38 - 126 U/L 85     AST 15 - 41 U/L 16     ALT 0 - 44 U/L 11       Hepatitis B antigen negative. 2-D echo.showed LVEF 60%  RADIOGRAPHIC STUDIES: I have personally reviewed the radiological images as listed and agreed with the findings in the report. 08/04/2018 CT abdomen and pelvis w contrast 1. No evidence of acute abnormality. 2. Stable stranding/haziness at the root of the mesentery and retroperitoneum. No new or enlarged lymph nodes identified. 3. Small to moderate umbilical hernia containing fat, moderate RIGHT LOWER lobe atelectasis and small loculated RIGHT pleural effusion again noted. 4. Cholelithiasis without CT evidence of acute cholecystitis. 5. Prostate enlargement 6.  Aortic Atherosclerosis (ICD10-I70.0).    ASSESSMENT & PLAN:  This is a 76 y.o. male with stage IV diffuse large B-cell lymphoma, presents to follow up.  1. Diffuse large B-cell lymphoma of extranodal site excluding spleen and other solid organs ([place])   2. Neuropathy    # DLBCL, clinically doing well, in complete remission.  It has been 4.5 years after he finishes treatment Labs are reviewed and discussed with patient. No constitutional symptoms Check UA. - no hematuria Previous imaging showed chronic  stable size, non hypermetabolic soft tissue encasing SMA and upper mesentery. Imaging as indicated.   #Chronic neuropathy, continue gabapentin as needed.  return of visit: lab (cbc, cmp, LDH, B12 and folate) /MD in 6 months.  Orders Placed This Encounter  Procedures   Urinalysis, Complete w Microscopic    Standing Status:   Future    Number of Occurrences:   1    Standing Expiration Date:   02/26/2023   CBC with Differential/Platelet    Standing Status:   Future    Standing Expiration Date:   02/26/2023   Comprehensive metabolic panel    Standing Status:   Future    Standing Expiration Date:   02/26/2023   Lactate dehydrogenase    Standing Status:   Future    Standing  Expiration Date:   02/26/2023   Earlie Server, MD, PhD 02/25/22

## 2022-02-26 ENCOUNTER — Telehealth: Payer: Self-pay

## 2022-02-26 NOTE — Telephone Encounter (Signed)
Spoke to patient who states that he is suppose to be taking B12 supplement daily but has not taken in a few days. Advised him to take daily as B12 level is low and will inform him if directions change. Pt verbalized understanding.

## 2022-02-26 NOTE — Telephone Encounter (Signed)
-----   Message from Earlie Server, MD sent at 02/25/2022 11:59 PM EDT ----- Please let patient know that his B12 level is low.  Recommend him to take Vitamin B12 1046mg daily.[ Currently B12 104m was listed in the list, please clarify]. Let me know if he is already taking 100087m

## 2022-03-09 ENCOUNTER — Other Ambulatory Visit: Payer: Self-pay | Admitting: *Deleted

## 2022-03-09 NOTE — Patient Outreach (Signed)
  Care Coordination   Initial Visit Note   03/09/2022 Name: John Gallegos MRN: [id number] DOB: 21-Aug-1945  John Gallegos is a 76 y.o. year old male who sees Tracie Harrier, MD for primary care. I spoke with  John Gallegos by phone today  What matters to the patients health and wellness today? Patient states he would like to improve his mobility and decrease the pain in his lower back   Goals Addressed             This Visit's Progress    Patient Stated       Improve my mobility and decrease my lower back pain.        SDOH assessments and interventions completed:   Yes   Care Coordination Interventions Activated:  Yes Care Coordination Interventions:  No, not indicated  Follow up plan: Follow up call scheduled for 03/18/22 @ 1500  Encounter Outcome:  Pt. Visit Completed  Emelia Loron RN, BSN [place] [phone] [email or web address]

## 2022-03-18 ENCOUNTER — Ambulatory Visit: Payer: Self-pay

## 2022-03-18 NOTE — Patient Instructions (Signed)
Visit Information  Thank you for taking time to visit with me today. Please don't hesitate to contact me if I can be of assistance to you.   Following are the goals we discussed today:   Goals Addressed             This Visit's Progress    COMPLETED: Patient Stated       Improve my mobility and decrease my lower back pain.     RNCM: Effective Management of pain       Care Coordination Interventions: 03-18-2022: The patient rates his back pain at a 2/3 today and rates his bilateral leg, neuropathy pain at an 8 today, states the right is worse than the left Reviewed provider established plan for pain management. 03-18-2022: the patient states that the meloxicam does not help with pain and he stopped taking it. The patient states that he sees pcp on a regular basis with next upcoming visit in September after having blood work on 04-05-2022. The patient does take gabapentin at bedtime and also Etodolac 400 mg as needed. He says he is able to live with it but it is annoying.  Discussed importance of adherence to all scheduled medical appointments: 04-05-2022 and 04-13-2022 Counseled on the importance of reporting any/all new or changed pain symptoms or management strategies to pain management provider Advised patient to report to care team affect of pain on daily activities. 03-18-2022: the patient does not do any structured activities. He does plan on working out in the gym on the treadmill when he feels more confident. He has a nice gym at the apartments he stays at.  Discussed use of relaxation techniques and/or diversional activities to assist with pain reduction (distraction, imagery, relaxation, massage, acupressure, TENS, heat, and cold application. 03-18-2022: Discussed the use of compression socks to help with circulation and pain in legs. The patient has used these in the past but not recently. The patient states that he will try this out and see if it will help. Reviewed with patient prescribed  pharmacological and nonpharmacological pain relief strategies Advised patient to discuss unresolved pain, changes in level or intensity of pain, other pain relief options available  with provider Screening for signs and symptoms of depression related to chronic disease state  Assessed social determinant of health barriers           Our next appointment is by telephone on 06-02-2022 at 330 pm  Please call the care guide team at [phone] if you need to cancel or reschedule your appointment.   If you are experiencing a Mental Health or [place] or need someone to talk to, please call the Suicide and Crisis Lifeline: 988 call the [place] National Suicide Prevention Lifeline: 478-857-2843 or TTY: (830)363-7454 TTY 480-792-9124) to talk to a trained counselor call 1-800-273-TALK (toll free, 24 hour hotline)  The patient verbalized understanding of instructions, educational materials, and care plan provided today and DECLINED offer to receive copy of patient instructions, educational materials, and care plan.   Telephone follow up appointment with care management team member scheduled for: 06-02-2022 at 330 pm   [place], MSN, [facility] Mobile: [phone]

## 2022-03-18 NOTE — Patient Outreach (Signed)
  Care Coordination   Initial Visit Note   03/18/2022 Name: John Gallegos MRN: [id number] DOB: May 31, 1946  John [place] is a 76 y.o. year old male who sees Tracie Harrier, MD for primary care. I spoke with  John Gallegos by phone today  What matters to the patients health and wellness today?  The neuropathy pain that is annoying     Goals Addressed             This Visit's Progress    COMPLETED: Patient Stated       Improve my mobility and decrease my lower back pain.     RNCM: Effective Management of pain       Care Coordination Interventions: 03-18-2022: The patient rates his back pain at a 2/3 today and rates his bilateral leg, neuropathy pain at an 8 today, states the right is worse than the left Reviewed provider established plan for pain management. 03-18-2022: the patient states that the meloxicam does not help with pain and he stopped taking it. The patient states that he sees pcp on a regular basis with next upcoming visit in September after having blood work on 04-05-2022. The patient does take gabapentin at bedtime and also Etodolac 400 mg as needed. He says he is able to live with it but it is annoying.  Discussed importance of adherence to all scheduled medical appointments: 04-05-2022 and 04-13-2022 Counseled on the importance of reporting any/all new or changed pain symptoms or management strategies to pain management provider Advised patient to report to care team affect of pain on daily activities. 03-18-2022: the patient does not do any structured activities. He does plan on working out in the gym on the treadmill when he feels more confident. He has a nice gym at the apartments he stays at.  Discussed use of relaxation techniques and/or diversional activities to assist with pain reduction (distraction, imagery, relaxation, massage, acupressure, TENS, heat, and cold application. 03-18-2022: Discussed the use of compression socks to help with circulation and  pain in legs. The patient has used these in the past but not recently. The patient states that he will try this out and see if it will help. Reviewed with patient prescribed pharmacological and nonpharmacological pain relief strategies Advised patient to discuss unresolved pain, changes in level or intensity of pain, other pain relief options available  with provider Screening for signs and symptoms of depression related to chronic disease state  Assessed social determinant of health barriers           SDOH assessments and interventions completed:  Yes  SDOH Interventions Today    Flowsheet Row Most Recent Value  SDOH Interventions   Food Insecurity Interventions Intervention Not Indicated  Financial Strain Interventions Intervention Not Indicated  Housing Interventions Intervention Not Indicated  Physical Activity Interventions Other (Comments)  [no structured activity, encouraged increasing activity]  Stress Interventions Intervention Not Indicated  Social Connections Interventions Other (Comment)  [is catholic, does not go to church, good support system]  Transportation Interventions Intervention Not Indicated        Care Coordination Interventions Activated:  Yes  Care Coordination Interventions:  Yes, provided   Follow up plan: Follow up call scheduled for 06-02-2022 at 330 pm    Encounter Outcome:  Pt. Visit Completed   Noreene Larsson RN, MSN, [facility] Mobile: [phone]

## 2022-03-22 ENCOUNTER — Ambulatory Visit: Payer: Medicare HMO | Admitting: Medical

## 2022-03-22 ENCOUNTER — Encounter: Payer: Self-pay | Admitting: Medical

## 2022-03-22 ENCOUNTER — Other Ambulatory Visit
Admission: RE | Admit: 2022-03-22 | Discharge: 2022-03-22 | Disposition: A | Discharge status: Home / Self Care | Payer: Medicare HMO | Source: Ambulatory Visit | Attending: Medical | Admitting: Medical

## 2022-03-22 VITALS — BP 128/64 | HR 58 | Ht 73.0 in | Wt 224.4 lb

## 2022-03-22 DIAGNOSIS — R778 Other specified abnormalities of plasma proteins: Secondary | ICD-10-CM | POA: Insufficient documentation

## 2022-03-22 DIAGNOSIS — I5032 Chronic diastolic (congestive) heart failure: Secondary | ICD-10-CM

## 2022-03-22 DIAGNOSIS — R001 Bradycardia, unspecified: Secondary | ICD-10-CM

## 2022-03-22 DIAGNOSIS — R7989 Other specified abnormal findings of blood chemistry: Secondary | ICD-10-CM

## 2022-03-22 DIAGNOSIS — R072 Precordial pain: Secondary | ICD-10-CM | POA: Diagnosis not present

## 2022-03-22 DIAGNOSIS — Z87898 Personal history of other specified conditions: Secondary | ICD-10-CM | POA: Diagnosis not present

## 2022-03-22 DIAGNOSIS — I10 Essential (primary) hypertension: Secondary | ICD-10-CM

## 2022-03-22 DIAGNOSIS — I25118 Atherosclerotic heart disease of native coronary artery with other forms of angina pectoris: Secondary | ICD-10-CM | POA: Diagnosis not present

## 2022-03-22 LAB — BASIC METABOLIC PANEL
Anion gap: 7 (ref 5–15)
BUN: 31 mg/dL — ABNORMAL HIGH (ref 8–23)
CO2: 26 mmol/L (ref 22–32)
Calcium: 9 mg/dL (ref 8.9–10.3)
Chloride: 103 mmol/L (ref 98–111)
Creatinine, Ser: 1.25 mg/dL — ABNORMAL HIGH (ref 0.61–1.24)
GFR, Estimated: 60 mL/min — ABNORMAL LOW (ref >60)
Glucose, Bld: 118 mg/dL — ABNORMAL HIGH (ref 70–99)
Potassium: 3 mmol/L — ABNORMAL LOW (ref 3.5–5.1)
Sodium: 136 mmol/L (ref 135–145)

## 2022-03-22 NOTE — Progress Notes (Signed)
Cardiology Office Note:    Date:  03/22/2022   ID:  John Gallegos, DOB February 09, 1946, MRN [id number]  PCP:  John Harrier, Gallegos  [facility] Cardiologist:  John Rogue, Gallegos  [facility] Electrophysiologist:  None   Referring Gallegos: John Harrier, Gallegos   Chief Complaint: 6 month follow-up  History of Present Illness:    John Gallegos is a 76 y.o. male with a hx of  history of HTN, diastolic dysfunction, non-Hodgkin's lymphoma status post chemotherapy, remote tobacco abuse, and gout, who is being seen today for 6 month follow-up.  He has also been noted to have significant three-vessel coronary calcium on CT scan.  Prior echocardiogram in February 2019 showed an EF of 50 to 55% with elevated PASP at 37 mmHg.  Hypertension has been managed with carvedilol, doxazosin, hydralazine, and losartan HCTZ in the outpatient setting.  Admitted 11/2021 with hypertensive urgency and acute diastolic heart failure. Treated with IV lasix. Troponin was elevated. Echo showed preserved EF. BB was held for bradcyardia. Outpatient icshemic testing was recommended.   Today, the patient reports he forgot about follow-up post-hospital visit. Has been doing well overall. He reports some shortness of breath at times with walking. Occasional lightheadedness. No chest pain. No LLE, orthopnea, or pnd. He takes lasix '40mg'$  daily or every other day. He will need a BMET. Patient is agreeable to Cardiac CTA.   Past Medical History:  Diagnosis Date   CHF (congestive heart failure) (HCC)    COPD (chronic obstructive pulmonary disease) ([place])    a. Quit smoking ~ 2018.   Diastolic dysfunction    a. 09/2017 Echo: EF 50-55%, mild RM, PASP 28mHg.   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, chemo tx's and in remission in 2019   Urinary incontinence    frequency    Past Surgical History:  Procedure Laterality Date   CYSTOSCOPY W/  RETROGRADES Left 08/19/2017   Procedure: CYSTOSCOPY WITH RETROGRADE PYELOGRAM;  Surgeon: John Gallegos;  Location: [facility];  Service: Urology;  Laterality: Left;   CYSTOSCOPY W/ URETERAL STENT REMOVAL Left 08/19/2017   Procedure: CYSTOSCOPY WITH STENT REMOVAL;  Surgeon: John Gallegos;  Location: [facility];  Service: Urology;  Laterality: Left;   CYSTOSCOPY WITH BIOPSY Left 05/13/2017   Procedure: CYSTOSCOPY WITH URETERAL BIOPSY;  Surgeon: John Retort Gallegos;  Location: [facility];  Service: Urology;  Laterality: Left;   CYSTOSCOPY WITH STENT PLACEMENT Left 05/13/2017   Procedure: CYSTOSCOPY WITH STENT PLACEMENT;  Surgeon: John Retort Gallegos;  Location: [facility];  Service: Urology;  Laterality: Left;   PORTA CATH INSERTION N/A 06/14/2017   Procedure: PORTA CATH INSERTION;  Surgeon: John Huxley Gallegos;  [facility];  Service: Cardiovascular;  Laterality: N/A;   PORTA CATH REMOVAL N/A 02/25/2020   Procedure: PORTA CATH REMOVAL;  Surgeon: John Huxley Gallegos;  [facility];  Service: Cardiovascular;  Laterality: N/A;   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   cholecalciferol (VITAMIN D3) 25 MCG (1000 UNIT) tablet Take 1,000 Units by mouth daily.   doxazosin (CARDURA) 2 MG tablet TAKE 1 TABLET(2 MG) BY MOUTH TWICE DAILY   etodolac (LODINE) 400 MG tablet Take 400 mg by mouth daily as needed for moderate pain.   furosemide (LASIX) 40 MG tablet Take 1  tablet (40 mg total) by mouth daily.   gabapentin (NEURONTIN) 300 MG capsule TAKE 1 CAPSULE(300 MG) BY MOUTH AT BEDTIME   hydrALAZINE (APRESOLINE) 100 MG tablet Take 100 mg by mouth 2 (two) times daily.   losartan (COZAAR) 100 MG tablet Take 1 tablet (100 mg total) by mouth daily.   potassium chloride SA (KLOR-CON M) 20 MEQ tablet Take 1 tablet (20 mEq total) by mouth daily. Monday, Wednesday and Friday.   pravastatin (PRAVACHOL) 40 MG tablet Take 1 tablet (40 mg total) by mouth at bedtime.   spironolactone (ALDACTONE) 25 MG tablet Take 1 tablet (25 mg total) by mouth daily.   vitamin B-12 (CYANOCOBALAMIN) 100 MCG tablet Take 100 mcg by mouth daily.   vitamin C (ASCORBIC ACID) 500 MG tablet Take 500 mg by mouth daily.   zolpidem (AMBIEN) 10 MG tablet Take 10 mg by mouth at bedtime.     Allergies:   Amlodipine   Social History   Socioeconomic History   Marital status: Legally Separated    Spouse name: Not on file   Number of children: Not on file   Years of education: Not on file   Highest education level: Not on file  Occupational History   Not on file  Tobacco Use   Smoking status: Former    Packs/day: 0.50    Years: 50.00    Total pack years: 25.00    Types: Cigarettes    Quit date: 12/12/2016    Years since quitting: 5.2   Smokeless tobacco: Never  Vaping Use   Vaping Use: Never used  Substance and Sexual Activity   Alcohol use: Yes    Alcohol/week: 2.0 standard drinks of alcohol    Types: 2 Shots of liquor per week    Comment: vodka and cranberry juice-one drink daily (10 oz glass)   Drug use: No   Sexual activity: Not on file  Other Topics Concern   Not on file  Social History Narrative   Lives locally in apartment.  Divorced but he and his ex-wife are close friends and they live in the same apt complex.  He does not routinely exercise.   Social Determinants of Health   Financial Resource Strain: Low Risk  (03/18/2022)   Overall Financial Resource Strain (CARDIA)     Difficulty of Paying Living Expenses: Not hard at all  Food Insecurity: No Food Insecurity (03/18/2022)   Hunger Vital Sign    Worried About Running Out of Food in the Last Year: Never true    Ran Out of Food in the Last Year: Never true  Transportation Needs: No Transportation Needs (03/18/2022)   PRAPARE - Hydrologist (Medical): No    Lack of Transportation (Non-Medical): No  Physical Activity: Unknown (03/18/2022)   Exercise Vital Sign    Days of Exercise per Week: 0 days    Minutes of Exercise per Session: Not on file  Stress: No Stress  Concern Present (03/18/2022)   [place]    Feeling of Stress : Not at all  Social Connections: Socially Isolated (03/18/2022)   Social Connection and Isolation Panel [NHANES]    Frequency of Communication with Friends and Family: More than three times a week    Frequency of Social Gatherings with Friends and Family: More than three times a week    Attends Religious Services: Never    Marine scientist or Organizations: No    Attends Music therapist: Never    Marital Status: Separated     Family History: The patient's family history includes Aneurysm in his mother; Heart attack in his mother; Lung cancer in his father; Lymphoma in his sister; Pancreatic cancer in his mother. There is no history of Prostate cancer, Kidney cancer, or Bladder Cancer.  ROS:   Please see the history of present illness.     All other systems reviewed and are negative.  EKGs/Labs/Other Studies Reviewed:    The following studies were reviewed today:  Echo 11/2021  1. Left ventricular ejection fraction, by estimation, is 60 to 65%. The  left ventricle has normal function. Left ventricular endocardial border  not optimally defined to evaluate regional wall motion. There is severe  asymmetric left ventricular  hypertrophy of the septal segment. Left  ventricular diastolic parameters  are consistent with Grade II diastolic dysfunction (pseudonormalization).  Elevated left atrial pressure.   2. Right ventricular systolic function is normal. The right ventricular  size is not well visualized. Tricuspid regurgitation signal is inadequate  for assessing PA pressure.   3. Left atrial size was mildly dilated.   4. Right atrial size was mildly dilated.   5. The mitral valve was not well visualized. No evidence of mitral valve  regurgitation. No evidence of mitral stenosis.   6. The aortic valve was not well visualized. Aortic valve regurgitation  is not visualized. No aortic stenosis is present.   EKG:  EKG is ordered today.  The ekg ordered today demonstrates SB 58bpm, TWI inferior leads, no changes  Recent Labs: 11/30/2021: B Natriuretic Peptide 873.1 12/01/2021: Magnesium 2.1 02/25/2022: ALT 11; BUN 13; Creatinine, Ser 0.87; Hemoglobin 13.7; Platelets 156; Potassium 4.0; Sodium 138  Recent Lipid Panel No results found for: "CHOL", "TRIG", "HDL", "CHOLHDL", "VLDL", "LDLCALC", "LDLDIRECT"  Physical Exam:    VS:  BP 128/64 (BP Location: Left Arm, Patient Position: Sitting, Cuff Size: Normal)   Pulse (!) 58   Ht '6\' 1"'$  (1.854 m)   Wt 224 lb 6.4 oz (101.8 kg)   SpO2 95%   BMI 29.61 kg/m     Wt Readings from Last 3 Encounters:  03/22/22 224 lb 6.4 oz (101.8 kg)  02/25/22 229 lb 11.2 oz (104.2 kg)  12/14/21 229 lb (103.9 kg)     GEN:  Well nourished, well developed in no acute distress HEENT: Normal NECK: No JVD; No carotid bruits LYMPHATICS: No lymphadenopathy CARDIAC: RRR, no murmurs, rubs, gallops RESPIRATORY:  Clear to auscultation without rales, wheezing or rhonchi  ABDOMEN: Soft, non-tender, non-distended MUSCULOSKELETAL:  No edema; No deformity  SKIN: Warm and dry NEUROLOGIC:  Alert and oriented x 3 PSYCHIATRIC:  Normal affect   ASSESSMENT:    1. Chronic diastolic heart failure (HCC)   2. Elevated troponin   3.  Coronary artery disease of native artery of native heart with stable angina pectoris ([place])   4. Essential hypertension   5. Sinus bradycardia  6. H/O prolonged Q-T interval on ECG    PLAN:    In order of problems listed above:  Chronic diastolic heart failure Echo 11/2021 showed LVEF 60-65%, severe LVH and G2DD. Patient is euvolemic on exam today. He takes lasix '40mg'$  daily or every other day. BMET today. Continue Losartan, spironolactone, and Hydralazine.  HTN BP is good today, 128/64. Continue Cardura '2mg'$ , Hydralazine '100mg'$  BID, Losartan '100mg'$  daily, spironolactone '25mg'$  daily.  Elevated troponin CAD by CT Elevated troponin in the setting of hypertension and acute heart failure during hospitalization in 11/2021. He denies chest pain, has some DOE. We will order a cardiac CTA. Continue Aspirin and simvastatin. No BB with bradycardia.   Prolonged Qtc Qtc 477m on EKG today.  Sinus bradycardia EKG shows SB with heart rate 59bpm. He is off the BB.   Disposition: Follow up in 6 month(s) with Gallegos/APP    Signed, John Matsuo HNinfa Meeker PA-C  03/22/2022 12:42 PM    [facility]

## 2022-03-22 NOTE — Patient Instructions (Signed)
Medication Instructions:  Your physician recommends that you continue on your current medications as directed. Please refer to the Current Medication list given to you today.  *If you need a refill on your cardiac medications before your next appointment, please call your pharmacy*   Lab Work: BMP today.  Please have your lab drawn at the [facility] Stop at the Registration desk to check in.  If you have labs (blood work) drawn today and your tests are completely normal, you will receive your results only by: [place] (if you have MyChart) OR A paper copy in the mail If you have any lab test that is abnormal or we need to change your treatment, we will call you to review the results.   Testing/Procedures: Your physician has requested that you have cardiac CT. Cardiac computed tomography (CT) is a painless test that uses an x-ray machine to take clear, detailed pictures of your heart. For further information please visit [email or web address].[email or web address]. Please follow instruction sheet as given.     Follow-Up: At [facility], you and your health needs are our priority.  As part of our continuing mission to provide you with exceptional heart care, we have created designated Provider Care Teams.  These Care Teams include your primary Cardiologist (physician) and Advanced Practice Providers (APPs -  Physician Assistants and Nurse Practitioners) who all work together to provide you with the care you need, when you need it.  We recommend signing up for the patient portal called "MyChart".  Sign up information is provided on this After Visit Summary.  MyChart is used to connect with patients for Virtual Visits (Telemedicine).  Patients are able to view lab/test results, encounter notes, upcoming appointments, etc.  Non-urgent messages can be sent to your provider as well.   To learn more about what you can do with MyChart, go to [email or web address].    Your next appointment:   6  month(s)  The format for your next appointment:   In Person  Provider:   You may see Ida Rogue, MD or one of the following Advanced Practice Providers on your designated Care Team:   Murray Hodgkins, NP Christell Faith, PA-C Cadence Kathlen Mody, PA-C{   Other Instructions  Your physician has requested that you have cardiac CT. Cardiac computed tomography (CT) is a painless test that uses an x-ray machine to take clear, detailed pictures of your heart.    Your cardiac CT will be scheduled at:  [facility] [address] [place], [place] [address] [phone]   Your appointment is scheduled on Thurs 03/25/22 @ 11:30 am  Please arrive 15 mins early for check-in and test prep.    Please follow these instructions carefully (unless otherwise directed):  Hold all erectile dysfunction medications at least 3 days (72 hrs) prior to test.  On the Night Before the Test: Be sure to Drink plenty of water. Do not consume any caffeinated/decaffeinated beverages or chocolate 12 hours prior to your test.  On the Day of the Test: Drink plenty of water until 1 hour prior to the test. Do not eat any food 4 hours prior to the test. You may take your regular medications prior to the test.         After the Test: Drink plenty of water. After receiving IV contrast, you may experience a mild flushed feeling. This is normal. On occasion, you may experience a mild rash up to 24 hours after the test. This is not  dangerous. If this occurs, you can take Benadryl 25 mg and increase your fluid intake. If you experience trouble breathing, this can be serious. If it is severe call 911 IMMEDIATELY. If it is mild, please call our office. If you take any of these medications: Glipizide/Metformin, Avandament, Glucavance, please do not take 48 hours after completing test unless otherwise instructed.  Please allow 2-4 weeks for scheduling of routine cardiac CTs.  Some insurance companies require a pre-authorization which may delay scheduling of this test.   For non-scheduling related questions, please contact the cardiac imaging nurse navigator should you have any questions/concerns: Marchia Bond, Cardiac Imaging Nurse Navigator Gordy Clement, Cardiac Imaging Nurse Navigator [facility] Direct Office Dial: [phone]   For scheduling needs, including cancellations and rescheduling, please call [place], [phone].

## 2022-03-22 NOTE — Addendum Note (Signed)
Addended by: Lamar Laundry on: 03/22/2022 02:25 PM   Modules accepted: Orders

## 2022-03-24 ENCOUNTER — Telehealth (HOSPITAL_COMMUNITY): Payer: Self-pay | Admitting: *Deleted

## 2022-03-24 ENCOUNTER — Telehealth: Payer: Self-pay | Admitting: *Deleted

## 2022-03-24 DIAGNOSIS — E876 Hypokalemia: Secondary | ICD-10-CM

## 2022-03-24 MED ORDER — FUROSEMIDE 40 MG PO TABS: 20.0000 mg | ORAL_TABLET | Freq: Every day | ORAL | Status: DC

## 2022-03-24 NOTE — Telephone Encounter (Signed)
Reaching out to patient to offer assistance regarding upcoming cardiac imaging study; pt verbalizes understanding of appt date/time, parking situation and where to check in, pre-test NPO status, and verified current allergies; name and call back number provided for further questions should they arise  Donnelle Olmeda RN Navigator Cardiac Imaging [facility] [phone] office [phone] cell  

## 2022-03-24 NOTE — Telephone Encounter (Signed)
Pt notified of lab results and provider's recc. Pt voiced understanding.   Pt states that he has actually missed several doses of potassium and states he just picked up a new Rx of potassium 20 mEq and does not want to get another Rx for 10 mEq. Pt states that he will take KCl 80 mEq (4 tablets of current dose) today and then asks if he can resume KCl 20 mEq Monday, Wednesday, Friday thereafter since he has new Rx and had not been taking regularly.   Pt will cut current Lasix 40 mg in half for decr dose of 20 mg daily.   Will forward to Cadence re pt's request for potassium dose.   Lab orders placed.

## 2022-03-24 NOTE — Telephone Encounter (Signed)
-----   Message from [place], PA-C sent at 03/23/2022  6:21 PM EDT ----- Potassium is low and it looks like he is dehydrated. Lets decrease lasix to '20mg'$  daily . Also clor-kon 23mq once and 135mdaily after that. BMET in 1 week

## 2022-03-25 ENCOUNTER — Other Ambulatory Visit: Payer: Self-pay | Admitting: Oncology

## 2022-03-25 ENCOUNTER — Ambulatory Visit
Admission: RE | Admit: 2022-03-25 | Discharge: 2022-03-25 | Disposition: A | Discharge status: Home / Self Care | Payer: Medicare HMO | Source: Ambulatory Visit | Attending: Medical | Admitting: Medical

## 2022-03-25 DIAGNOSIS — I25118 Atherosclerotic heart disease of native coronary artery with other forms of angina pectoris: Secondary | ICD-10-CM | POA: Insufficient documentation

## 2022-03-25 DIAGNOSIS — I2510 Atherosclerotic heart disease of native coronary artery without angina pectoris: Secondary | ICD-10-CM

## 2022-03-25 DIAGNOSIS — R778 Other specified abnormalities of plasma proteins: Secondary | ICD-10-CM | POA: Insufficient documentation

## 2022-03-25 DIAGNOSIS — R072 Precordial pain: Secondary | ICD-10-CM | POA: Insufficient documentation

## 2022-03-25 MED ORDER — METOPROLOL TARTRATE 5 MG/5ML IV SOLN
10.0000 mg | Freq: Once | INTRAVENOUS | Status: AC
  Administered 2022-03-25: 10 mg via INTRAVENOUS

## 2022-03-25 MED ORDER — IOHEXOL 350 MG/ML SOLN
75.0000 mL | Freq: Once | INTRAVENOUS | Status: AC | PRN
  Administered 2022-03-25: 75 mL via INTRAVENOUS

## 2022-03-25 MED ORDER — NITROGLYCERIN 0.4 MG SL SUBL
0.8000 mg | SUBLINGUAL_TABLET | Freq: Once | SUBLINGUAL | Status: AC
  Administered 2022-03-25: 0.8 mg via SUBLINGUAL

## 2022-03-25 NOTE — Progress Notes (Signed)
Patient tolerated procedure well. Ambulate w/o difficulty. Denies light headedness or being dizzy. Sitting in chair drinking water provided. Encouraged to drink extra water today and reasoning explained. Verbalized understanding. All questions answered. ABC intact. No further needs. Discharge from procedure area w/o issues.   °

## 2022-03-26 ENCOUNTER — Telehealth: Payer: Self-pay

## 2022-03-26 MED ORDER — NITROGLYCERIN 0.4 MG SL SUBL: 0.4000 mg | SUBLINGUAL_TABLET | SUBLINGUAL | 1 refills | Status: AC | PRN

## 2022-03-26 NOTE — Telephone Encounter (Signed)
Patient made aware of Cardiac CTA results. Patient declined a APP appt and only wants to see Dr. Rockey Situ to discuss the proceeding with the recommended cardiac cath. Appt scheduled on 8/21 @ 3:20 pm with Dr. Rockey Situ.  Rx for Nitro sent to the pt pharmacy. Explained how and when to use Nitro. Pt denies any chest pain. ED precautions given. Pt verbalized understanding.

## 2022-03-26 NOTE — Telephone Encounter (Signed)
-----   Message from [place], PA-C sent at 03/26/2022 12:39 PM EDT ----- Cardiac CTA showed very high calcium score 5071. Imaing showed severe stenosis in the RCA and LAD and moderate in the proximal Lcx. Cardiac cath is recommended. Can we get him in next week to discuss this procedure. Also can we get him SL NTG x 25.

## 2022-03-26 NOTE — Telephone Encounter (Signed)
Fur[place], Cadence H, PA-C  You 55 minutes ago (12:29 PM)   Yes that's fine .  We can re-check labs in a week.     Pt notified. No further questions at this time. Pt [facility].

## 2022-03-26 NOTE — Telephone Encounter (Signed)
-----   Message from [place], PA-C sent at 03/26/2022 12:39 PM EDT ----- Cardiac CTA showed very high calcium score 5071. Imaing showed severe stenosis in the RCA and LAD and moderate in the proximal Lcx. Cardiac cath is recommended. Can we get him in next week to discuss this procedure. Also can we get him SL NTG x 25.

## 2022-03-28 NOTE — H&P (View-Only) (Signed)
Cardiology Office Note  Date:  03/29/2022   ID:  John Gallegos, DOB Feb 22, 1946, MRN [id number]  PCP:  Tracie Harrier, MD   Chief Complaint  Patient presents with   Follow up Cardiac CTA results.     "Doing well." Medications reviewed by the patient verbally.     HPI:  76 year old male with past medical history of Smoking, quit in 05/2017  HTN diffuse large B cell lymphoma (malaise summer 2018, blood in urine) CT scan 04/20/2017 which revealed moderate left hydronephrosis and delayed excretion down to a large 4.4 cm mass in or around the left proximal ureter. Significant native three-vessel coronary disease on CT scan Admitted 11/2021 with hypertensive urgency and acute diastolic heart failure. Treated with IV lasix. Troponin was elevated. Echo showed preserved EF. BB was held for bradcyardia.  Presents for follow-up of his chest pain, angina, follow-up of his cardiac CTA  LOV January 2023 Seen by one of our providers March 22, 2022 Ischemic work-up performed for chest pain while he was in the hospital April 2023  Cardiac CTA was ordered, performed last week 03/25/22 Very high coronary calcium score of 5071. This was 97th percentile for age and sex matched control. 2. Normal coronary origin with right dominance. 3. Heavily calcified 3 vessel coronary arteries. 4. Severe stenosis (>70%) in the proximal RCA and LAD. 5. Moderate stenosis (50-69%) in the proximal LCx. 6. CAD-RADS 4 Severe stenosis. (70-99% or > 50% left main).   In follow-up today, concerned about his cardiac CTA results Sedentary at baseline, chronic shortness of breath Limited by chronic back pain  No longer smoking Presents today with his wife Blood pressures been well controlled at home on current medication regiment  Labs reviewed Total chol 145 up to 203, off statin A1C 5.9  EKG personally reviewed by myself on todays visit Normal sinus rhythm rate 70 bpm no significant ST-T wave  changes  Other past medical history reviewed Gastroenteritis-secondary to COVID-19 infection. 09/2020 Dehydrated, CR 1.77 Weakness Hypokalemia Treated with steroids Losartan HCTZ was held at d/c  History of lymphoma, has finished treatment Has neuropathy  Echo 06/07/2017 Normal EF 60%  previous imaging studies reviewed with him in detail  PET scan 05/18/2017  IMPRESSION: 1. Multifocal metastatic disease within the LEFT retroperitoneum, central mesenteries, and RIGHT paraspinal musculature.Two foci of skeletal metastasis  RCHOP chemotherapy, completed 4 rounds rounds, 2 more to go  PMH:   has a past medical history of CHF (congestive heart failure) ([place]), COPD (chronic obstructive pulmonary disease) ([place]), Diastolic dysfunction, Gout, Hard of hearing, Hiatal hernia, Hypertension, Non Hodgkin's lymphoma ([place]) (04/2017), and Urinary incontinence.  PSH:    Past Surgical History:  Procedure Laterality Date   CYSTOSCOPY W/ RETROGRADES Left 08/19/2017   Procedure: CYSTOSCOPY WITH RETROGRADE PYELOGRAM;  Surgeon: Abbie Sons, MD;  Location: [facility];  Service: Urology;  Laterality: Left;   CYSTOSCOPY W/ URETERAL STENT REMOVAL Left 08/19/2017   Procedure: CYSTOSCOPY WITH STENT REMOVAL;  Surgeon: Abbie Sons, MD;  Location: [facility];  Service: Urology;  Laterality: Left;   CYSTOSCOPY WITH BIOPSY Left 05/13/2017   Procedure: CYSTOSCOPY WITH URETERAL BIOPSY;  Surgeon: Nickie Retort, MD;  Location: [facility];  Service: Urology;  Laterality: Left;   CYSTOSCOPY WITH STENT PLACEMENT Left 05/13/2017   Procedure: CYSTOSCOPY WITH STENT PLACEMENT;  Surgeon: Nickie Retort, MD;  Location: [facility];  Service: Urology;  Laterality: Left;   PORTA CATH INSERTION N/A 06/14/2017   Procedure: PORTA CATH INSERTION;  Surgeon: Lucky Cowboy,  Erskine Squibb, MD;  Location: [place] [facility];  Service: Cardiovascular;  Laterality: N/A;   PORTA CATH REMOVAL N/A 02/25/2020   Procedure: PORTA CATH REMOVAL;  Surgeon:  Algernon Huxley, MD;  Location: [place] [facility];  Service: Cardiovascular;  Laterality: N/A;   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     cholecalciferol (VITAMIN D3) 25 MCG (1000 UNIT) tablet Take 1,000 Units by mouth daily.     doxazosin (CARDURA) 2 MG tablet TAKE 1 TABLET(2 MG) BY MOUTH TWICE DAILY 180 tablet 0   etodolac (LODINE) 400 MG tablet Take 400 mg by mouth daily as needed for moderate pain.     furosemide (LASIX) 40 MG tablet Take 0.5 tablets (20 mg total) by mouth daily.     gabapentin (NEURONTIN) 300 MG capsule TAKE 1 CAPSULE(300 MG) BY MOUTH AT BEDTIME 90 capsule 1   hydrALAZINE (APRESOLINE) 100 MG tablet Take 100 mg by mouth 2 (two) times daily.     losartan (COZAAR) 100 MG tablet Take 1 tablet (100 mg total) by mouth daily. 30 tablet 1   nitroGLYCERIN (NITROSTAT) 0.4 MG SL tablet Place 1 tablet (0.4 mg total) under the tongue every 5 (five) minutes as needed for chest pain. 25 tablet 1   potassium chloride SA (KLOR-CON M) 20 MEQ tablet Take 1 tablet (20 mEq total) by mouth daily. Monday, Wednesday and Friday. 30 tablet 1   pravastatin (PRAVACHOL) 40 MG tablet Take 1 tablet (40 mg total) by mouth at bedtime. 90 tablet 3   spironolactone (ALDACTONE) 25 MG tablet Take 1 tablet (25 mg total) by mouth daily. 30 tablet 1   vitamin B-12 (CYANOCOBALAMIN) 100 MCG tablet Take 100 mcg by mouth daily.     vitamin C (ASCORBIC ACID) 500 MG tablet Take 500 mg by mouth daily.      zolpidem (AMBIEN) 10 MG tablet Take 10 mg by mouth at bedtime.     meloxicam (MOBIC) 7.5 MG tablet Take 7.5 mg by mouth daily as needed. (Patient not taking: Reported on 03/18/2022)     No current facility-administered medications for this visit.    Allergies:   Amlodipine   Social History:  The patient  reports that he quit smoking about 5 years ago. His smoking use included cigarettes. He has a 25.00 pack-year smoking history. He has never used smokeless tobacco. He reports current alcohol use of about 2.0 standard drinks of alcohol per week. He reports that he does not use drugs.   Family History:   family history includes Aneurysm in his mother; Heart attack in his mother; Lung cancer in his father; Lymphoma in his sister; Pancreatic cancer in his mother.    Review of Systems: Review of Systems  Constitutional: Negative.   HENT: Negative.    Respiratory: Negative.    Cardiovascular: Negative.   Gastrointestinal: Negative.   Musculoskeletal:  Positive for joint pain.  Nerve pain  Neurological: Negative.   Psychiatric/Behavioral: Negative.    All other systems reviewed and are negative.   PHYSICAL EXAM: VS:  BP 130/70 (BP Location: Left Arm, Patient Position: Sitting, Cuff Size: Normal)   Pulse 70   Ht '6\' 1"'$  (1.854 m)   Wt 226 lb 8 oz (102.7 kg)   SpO2 97%   BMI 29.88 kg/m  , BMI Body mass index is 29.88 kg/m. Constitutional:  oriented to person, place, and time. No distress.  HENT:  Head: Grossly normal Eyes:  no discharge. No scleral icterus.  Neck: No JVD, no carotid bruits  Cardiovascular: Regular rate and rhythm, no murmurs appreciated Pulmonary/Chest: Clear to auscultation bilaterally, no wheezes or rails Abdominal: Soft.  no distension.  no tenderness.  Musculoskeletal: Normal range of motion Neurological:  normal muscle tone. Coordination normal. No atrophy Skin: Skin warm and dry Psychiatric: normal affect, pleasant  Recent Labs: 11/30/2021: B  Natriuretic Peptide 873.1 12/01/2021: Magnesium 2.1 02/25/2022: ALT 11; Hemoglobin 13.7; Platelets 156 03/22/2022: BUN 31; Creatinine, Ser 1.25; Potassium 3.0; Sodium 136    Lipid Panel No results found for: "CHOL", "HDL", "LDLCALC", "TRIG"    Wt Readings from Last 3 Encounters:  03/29/22 226 lb 8 oz (102.7 kg)  03/22/22 224 lb 6.4 oz (101.8 kg)  02/25/22 229 lb 11.2 oz (104.2 kg)     ASSESSMENT AND PLAN:  Coronary artery disease with stable angina Severe three-vessel coronary disease on cardiac CTA most notably in the proximal RCA and LAD Chronic shortness of breath, sedentary secondary to diffuse arthritides including his back Recommended cardiac catheterization I have reviewed the risks, indications, and alternatives to cardiac catheterization, possible angioplasty, and stenting with the patient. Risks include but are not limited to bleeding, infection, vascular injury, stroke, myocardial infection, arrhythmia, kidney injury, radiation-related injury in the case of prolonged fluoroscopy use, emergency cardiac surgery, and death. The patient understands the risks of serious complication is 1-2 in 6812 with diagnostic cardiac cath and 1-2% or less with angioplasty/stenting.  Given heavy calcium we will schedule him in [place] next week Continue current medications  Diffuse large B-cell lymphoma of extranodal site excluding spleen and other solid organs ([place]) Completed RCHOP Followed by oncology, no recent treatment stable  Mixed hyperlipidemia Continue pravastatin  Essential hypertension Blood pressure is well controlled on today's visit. No changes made to the medications.  Chest pain with moderate risk for cardiac etiology Plan as above for cardiac catheterization  Smoker Stopped smoking >3 years  Copd Reports breathing is stable, currently not on inhalers No issues   Total encounter time more than 40 minutes  Greater than 50% was spent in counseling and  coordination of care with the patient   No orders of the defined types were placed in this encounter.    Signed, Esmond Plants, M.D., Ph.D. 03/29/2022  [place], [place]

## 2022-03-28 NOTE — Progress Notes (Unsigned)
Cardiology Office Note  Date:  03/29/2022   ID:  John Gallegos, DOB [birth year], MRN [id number]  PCP:  Tracie Harrier, MD   Chief Complaint  Patient presents with   Follow up Cardiac CTA results.     "Doing well." Medications reviewed by the patient verbally.     HPI:  76 year old male with past medical history of Smoking, quit in 05/2017  HTN diffuse large B cell lymphoma (malaise summer 2018, blood in urine) CT scan 04/20/2017 which revealed moderate left hydronephrosis and delayed excretion down to a large 4.4 cm mass in or around the left proximal ureter. Significant native three-vessel coronary disease on CT scan Admitted 11/2021 with hypertensive urgency and acute diastolic heart failure. Treated with IV lasix. Troponin was elevated. Echo showed preserved EF. BB was held for bradcyardia.  Presents for follow-up of his chest pain, angina, follow-up of his cardiac CTA  LOV January 2023 Seen by one of our providers March 22, 2022 Ischemic work-up performed for chest pain while he was in the hospital April 2023  Cardiac CTA was ordered, performed last week 03/25/22 Very high coronary calcium score of 5071. This was 97th percentile for age and sex matched control. 2. Normal coronary origin with right dominance. 3. Heavily calcified 3 vessel coronary arteries. 4. Severe stenosis (>70%) in the proximal RCA and LAD. 5. Moderate stenosis (50-69%) in the proximal LCx. 6. CAD-RADS 4 Severe stenosis. (70-99% or > 50% left main).   In follow-up today, concerned about his cardiac CTA results Sedentary at baseline, chronic shortness of breath Limited by chronic back pain  No longer smoking Presents today with his wife Blood pressures been well controlled at home on current medication regiment  Labs reviewed Total chol 145 up to 203, off statin A1C 5.9  EKG personally reviewed by myself on todays visit Normal sinus rhythm rate 70 bpm no significant ST-T wave  changes  Other past medical history reviewed Gastroenteritis-secondary to COVID-19 infection. 09/2020 Dehydrated, CR 1.77 Weakness Hypokalemia Treated with steroids Losartan HCTZ was held at d/c  History of lymphoma, has finished treatment Has neuropathy  Echo 06/07/2017 Normal EF 60%  previous imaging studies reviewed with him in detail  PET scan 05/18/2017  IMPRESSION: 1. Multifocal metastatic disease within the LEFT retroperitoneum, central mesenteries, and RIGHT paraspinal musculature.Two foci of skeletal metastasis  RCHOP chemotherapy, completed 4 rounds rounds, 2 more to go  PMH:   has a past medical history of CHF (congestive heart failure) ([place]), COPD (chronic obstructive pulmonary disease) ([place]), Diastolic dysfunction, Gout, Hard of hearing, Hiatal hernia, Hypertension, Non Hodgkin's lymphoma ([place]) (04/2017), and Urinary incontinence.  PSH:    Past Surgical History:  Procedure Laterality Date   CYSTOSCOPY W/ RETROGRADES Left 08/19/2017   Procedure: CYSTOSCOPY WITH RETROGRADE PYELOGRAM;  Surgeon: Abbie Sons, MD;  Location: [facility];  Service: Urology;  Laterality: Left;   CYSTOSCOPY W/ URETERAL STENT REMOVAL Left 08/19/2017   Procedure: CYSTOSCOPY WITH STENT REMOVAL;  Surgeon: Abbie Sons, MD;  Location: [facility];  Service: Urology;  Laterality: Left;   CYSTOSCOPY WITH BIOPSY Left 05/13/2017   Procedure: CYSTOSCOPY WITH URETERAL BIOPSY;  Surgeon: Nickie Retort, MD;  Location: [facility];  Service: Urology;  Laterality: Left;   CYSTOSCOPY WITH STENT PLACEMENT Left 05/13/2017   Procedure: CYSTOSCOPY WITH STENT PLACEMENT;  Surgeon: Nickie Retort, MD;  Location: [facility];  Service: Urology;  Laterality: Left;   PORTA CATH INSERTION N/A 06/14/2017   Procedure: PORTA CATH INSERTION;  Surgeon: Lucky Cowboy,  Erskine Squibb, MD;  Location: [facility];  Service: Cardiovascular;  Laterality: N/A;   PORTA CATH REMOVAL N/A 02/25/2020   Procedure: PORTA CATH REMOVAL;  Surgeon:  Algernon Huxley, MD;  Location: [place] [facility];  Service: Cardiovascular;  Laterality: N/A;   pyleostenosis     18 weeks old   right knne surgery     needle went through knee   TONSILLECTOMY     URETEROSCOPY Left 05/13/2017   Procedure: URETEROSCOPY;  Surgeon: Nickie Retort, MD;  Location: [facility];  Service: Urology;  Laterality: Left;   XI ROBOTIC ASSISTED VENTRAL HERNIA N/A 05/18/2019   Procedure: XI ROBOTIC ASSISTED UMBILICAL HERNIA;  Surgeon: Benjamine Sprague, DO;  Location: [facility];  Service: General;  Laterality: N/A;    Current Outpatient Medications  Medication Sig Dispense Refill   aspirin EC 81 MG tablet Take 81 mg by mouth daily.     cholecalciferol (VITAMIN D3) 25 MCG (1000 UNIT) tablet Take 1,000 Units by mouth daily.     doxazosin (CARDURA) 2 MG tablet TAKE 1 TABLET(2 MG) BY MOUTH TWICE DAILY 180 tablet 0   etodolac (LODINE) 400 MG tablet Take 400 mg by mouth daily as needed for moderate pain.     furosemide (LASIX) 40 MG tablet Take 0.5 tablets (20 mg total) by mouth daily.     gabapentin (NEURONTIN) 300 MG capsule TAKE 1 CAPSULE(300 MG) BY MOUTH AT BEDTIME 90 capsule 1   hydrALAZINE (APRESOLINE) 100 MG tablet Take 100 mg by mouth 2 (two) times daily.     losartan (COZAAR) 100 MG tablet Take 1 tablet (100 mg total) by mouth daily. 30 tablet 1   nitroGLYCERIN (NITROSTAT) 0.4 MG SL tablet Place 1 tablet (0.4 mg total) under the tongue every 5 (five) minutes as needed for chest pain. 25 tablet 1   potassium chloride SA (KLOR-CON M) 20 MEQ tablet Take 1 tablet (20 mEq total) by mouth daily. Monday, Wednesday and Friday. 30 tablet 1   pravastatin (PRAVACHOL) 40 MG tablet Take 1 tablet (40 mg total) by mouth at bedtime. 90 tablet 3   spironolactone (ALDACTONE) 25 MG tablet Take 1 tablet (25 mg total) by mouth daily. 30 tablet 1   vitamin B-12 (CYANOCOBALAMIN) 100 MCG tablet Take 100 mcg by mouth daily.     vitamin C (ASCORBIC ACID) 500 MG tablet Take 500 mg by mouth daily.      zolpidem (AMBIEN) 10 MG tablet Take 10 mg by mouth at bedtime.     meloxicam (MOBIC) 7.5 MG tablet Take 7.5 mg by mouth daily as needed. (Patient not taking: Reported on 03/18/2022)     No current facility-administered medications for this visit.    Allergies:   Amlodipine   Social History:  The patient  reports that he quit smoking about 5 years ago. His smoking use included cigarettes. He has a 25.00 pack-year smoking history. He has never used smokeless tobacco. He reports current alcohol use of about 2.0 standard drinks of alcohol per week. He reports that he does not use drugs.   Family History:   family history includes Aneurysm in his mother; Heart attack in his mother; Lung cancer in his father; Lymphoma in his sister; Pancreatic cancer in his mother.    Review of Systems: Review of Systems  Constitutional: Negative.   HENT: Negative.    Respiratory: Negative.    Cardiovascular: Negative.   Gastrointestinal: Negative.   Musculoskeletal:  Positive for joint pain.  Nerve pain  Neurological: Negative.   Psychiatric/Behavioral: Negative.    All other systems reviewed and are negative.   PHYSICAL EXAM: VS:  BP 130/70 (BP Location: Left Arm, Patient Position: Sitting, Cuff Size: Normal)   Pulse 70   Ht '6\' 1"'$  (1.854 m)   Wt 226 lb 8 oz (102.7 kg)   SpO2 97%   BMI 29.88 kg/m  , BMI Body mass index is 29.88 kg/m. Constitutional:  oriented to person, place, and time. No distress.  HENT:  Head: Grossly normal Eyes:  no discharge. No scleral icterus.  Neck: No JVD, no carotid bruits  Cardiovascular: Regular rate and rhythm, no murmurs appreciated Pulmonary/Chest: Clear to auscultation bilaterally, no wheezes or rails Abdominal: Soft.  no distension.  no tenderness.  Musculoskeletal: Normal range of motion Neurological:  normal muscle tone. Coordination normal. No atrophy Skin: Skin warm and dry Psychiatric: normal affect, pleasant  Recent Labs: 11/30/2021: B  Natriuretic Peptide 873.1 12/01/2021: Magnesium 2.1 02/25/2022: ALT 11; Hemoglobin 13.7; Platelets 156 03/22/2022: BUN 31; Creatinine, Ser 1.25; Potassium 3.0; Sodium 136    Lipid Panel No results found for: "CHOL", "HDL", "LDLCALC", "TRIG"    Wt Readings from Last 3 Encounters:  03/29/22 226 lb 8 oz (102.7 kg)  03/22/22 224 lb 6.4 oz (101.8 kg)  02/25/22 229 lb 11.2 oz (104.2 kg)     ASSESSMENT AND PLAN:  Coronary artery disease with stable angina Severe three-vessel coronary disease on cardiac CTA most notably in the proximal RCA and LAD Chronic shortness of breath, sedentary secondary to diffuse arthritides including his back Recommended cardiac catheterization I have reviewed the risks, indications, and alternatives to cardiac catheterization, possible angioplasty, and stenting with the patient. Risks include but are not limited to bleeding, infection, vascular injury, stroke, myocardial infection, arrhythmia, kidney injury, radiation-related injury in the case of prolonged fluoroscopy use, emergency cardiac surgery, and death. The patient understands the risks of serious complication is 1-2 in 8315 with diagnostic cardiac cath and 1-2% or less with angioplasty/stenting.  Given heavy calcium we will schedule him in [place] next week Continue current medications  Diffuse large B-cell lymphoma of extranodal site excluding spleen and other solid organs ([place]) Completed RCHOP Followed by oncology, no recent treatment stable  Mixed hyperlipidemia Continue pravastatin  Essential hypertension Blood pressure is well controlled on today's visit. No changes made to the medications.  Chest pain with moderate risk for cardiac etiology Plan as above for cardiac catheterization  Smoker Stopped smoking >3 years  Copd Reports breathing is stable, currently not on inhalers No issues   Total encounter time more than 40 minutes  Greater than 50% was spent in counseling and  coordination of care with the patient   No orders of the defined types were placed in this encounter.    Signed, Esmond Plants, M.D., Ph.D. 03/29/2022  [place], [place]

## 2022-03-29 ENCOUNTER — Encounter: Payer: Self-pay | Admitting: Cardiovascular Disease

## 2022-03-29 ENCOUNTER — Ambulatory Visit: Payer: Medicare HMO | Admitting: Cardiovascular Disease

## 2022-03-29 VITALS — BP 130/70 | HR 70 | Ht 73.0 in | Wt 226.5 lb

## 2022-03-29 DIAGNOSIS — Z87891 Personal history of nicotine dependence: Secondary | ICD-10-CM | POA: Diagnosis not present

## 2022-03-29 DIAGNOSIS — I25118 Atherosclerotic heart disease of native coronary artery with other forms of angina pectoris: Secondary | ICD-10-CM

## 2022-03-29 DIAGNOSIS — C8518 Unspecified B-cell lymphoma, lymph nodes of multiple sites: Secondary | ICD-10-CM | POA: Diagnosis not present

## 2022-03-29 DIAGNOSIS — Z01812 Encounter for preprocedural laboratory examination: Secondary | ICD-10-CM

## 2022-03-29 DIAGNOSIS — Z87898 Personal history of other specified conditions: Secondary | ICD-10-CM

## 2022-03-29 DIAGNOSIS — I5032 Chronic diastolic (congestive) heart failure: Secondary | ICD-10-CM

## 2022-03-29 DIAGNOSIS — I10 Essential (primary) hypertension: Secondary | ICD-10-CM | POA: Diagnosis not present

## 2022-03-29 DIAGNOSIS — R001 Bradycardia, unspecified: Secondary | ICD-10-CM | POA: Diagnosis not present

## 2022-03-29 DIAGNOSIS — E782 Mixed hyperlipidemia: Secondary | ICD-10-CM

## 2022-03-29 DIAGNOSIS — R072 Precordial pain: Secondary | ICD-10-CM

## 2022-03-29 DIAGNOSIS — C8300 Small cell B-cell lymphoma, unspecified site: Secondary | ICD-10-CM

## 2022-03-29 NOTE — Patient Instructions (Signed)
Medication Instructions:  No changes  If you need a refill on your cardiac medications before your next appointment, please call your pharmacy.    Lab work: No new labs needed   Testing/Procedures:  1) Cardiac Cath:   You are scheduled for a Cardiac Catheterization on Wednesday, August 30 with Dr. Kathlyn [place].  1. Please arrive at the Main Entrance A at [facility]: [place], [place] [address] at 10:30 AM (This time is two hours before your procedure to ensure your preparation). Free valet parking service is available.   Special note: Every effort is made to have your procedure done on time. Please understand that emergencies sometimes delay scheduled procedures.  2. Diet: Do not eat solid foods after midnight.  You may have clear liquids until 5 AM upon the day of the procedure.  3. Labs: You will need to have blood drawn on Monday, August 21 at [facility], Go to 1st desk on your right to register.  Address: [address], [place] [address]  Open: 7:30 am - 5:30 pm  Phone: [phone]. You do not need to be fasting.  4. Medication instructions in preparation for your procedure:   Contrast Allergy: No    The morning of your procedure, DO NOT TAKE: Lasix (furosemide) Aldactone (spironlactone)    On the morning of your procedure, DO take Aspirin and any morning medicines NOT listed above.  You may use sips of water.  5. Plan to go home the same day, you will only stay overnight if medically necessary. 6. You MUST have a responsible adult to drive you home. 7. An adult MUST be with you the first 24 hours after you arrive home. 8. Bring a current list of your medications, and the last time and date medication taken. 9. Bring ID and current insurance cards. 10.Please wear clothes that are easy to get on and off and wear slip-on shoes.  Thank you for allowing [place] to care for you!   -- [facility]  [facility]    Follow-Up: At [facility], you and your health needs are our priority.  As part of our continuing mission to provide you with exceptional heart care, we have created designated Provider Care Teams.  These Care Teams include your primary Cardiologist (physician) and Advanced Practice Providers (APPs -  Physician Assistants and Nurse Practitioners) who all work together to provide you with the care you need, when you need it.  You will need a follow up appointment in 1 month   Providers on your designated Care Team:   Murray Hodgkins, NP Christell Faith, PA-C Cadence Kathlen Mody, [place]  COVID-19 Vaccine Information can be found at: [email or web address].uk For questions related to vaccine distribution or appointments, please email [email or web address] .[email or web address] or call [phone])[phone].

## 2022-03-31 ENCOUNTER — Other Ambulatory Visit: Payer: Self-pay | Admitting: Cardiovascular Disease

## 2022-03-31 ENCOUNTER — Other Ambulatory Visit
Admission: RE | Admit: 2022-03-31 | Discharge: 2022-03-31 | Disposition: A | Discharge status: Home / Self Care | Payer: Medicare HMO | Attending: Cardiovascular Disease | Admitting: Cardiovascular Disease

## 2022-03-31 DIAGNOSIS — I200 Unstable angina: Secondary | ICD-10-CM

## 2022-03-31 DIAGNOSIS — I25118 Atherosclerotic heart disease of native coronary artery with other forms of angina pectoris: Secondary | ICD-10-CM | POA: Insufficient documentation

## 2022-03-31 DIAGNOSIS — Z01812 Encounter for preprocedural laboratory examination: Secondary | ICD-10-CM | POA: Diagnosis not present

## 2022-03-31 LAB — BASIC METABOLIC PANEL
Anion gap: 7 (ref 5–15)
BUN: 14 mg/dL (ref 8–23)
CO2: 26 mmol/L (ref 22–32)
Calcium: 9.5 mg/dL (ref 8.9–10.3)
Chloride: 108 mmol/L (ref 98–111)
Creatinine, Ser: 1.05 mg/dL (ref 0.61–1.24)
GFR, Estimated: >60 mL/min (ref >60)
Glucose, Bld: 103 mg/dL — ABNORMAL HIGH (ref 70–99)
Potassium: 4.2 mmol/L (ref 3.5–5.1)
Sodium: 141 mmol/L (ref 135–145)

## 2022-03-31 LAB — CBC
HCT: 39.6 % (ref 39.0–52.0)
Hemoglobin: 13.1 g/dL (ref 13.0–17.0)
MCH: 30.1 pg (ref 26.0–34.0)
MCHC: 33.1 g/dL (ref 30.0–36.0)
MCV: 91 fL (ref 80.0–100.0)
Platelets: 181 10*3/uL (ref 150–400)
RBC: 4.35 MIL/uL (ref 4.22–5.81)
RDW: 13.2 % (ref 11.5–15.5)
WBC: 5 10*3/uL (ref 4.0–10.5)
nRBC: 0 % (ref 0.0–0.2)

## 2022-03-31 MED ORDER — SODIUM CHLORIDE 0.9% FLUSH: 3.0000 mL | Freq: Two times a day (BID) | INTRAVENOUS | Status: AC

## 2022-04-02 ENCOUNTER — Other Ambulatory Visit: Payer: Self-pay | Admitting: Cardiovascular Disease

## 2022-04-05 ENCOUNTER — Telehealth: Payer: Self-pay | Admitting: *Deleted

## 2022-04-05 NOTE — Telephone Encounter (Signed)
Cardiac Catheterization scheduled at [facility] for: Wednesday April 07, 2022 12:30 PM Arrival time and place: [place] Entrance A at: 10:30 AM   Nothing to eat after midnight prior to procedure, clear liquids until 5 AM day of procedure.  Medication instructions: -Hold:  Spironolactone/KCl/Lasix -AM of procedure -Except hold medications usual morning medications can be taken with sips of water including aspirin 81 mg.  Confirmed patient has responsible adult to drive home post procedure and be with patient first 24 hours after arriving home.  Patient reports no new symptoms concerning for COVID-19 in the past 10 days.  Reviewed procedure instructions with patient.

## 2022-04-06 ENCOUNTER — Other Ambulatory Visit: Payer: Self-pay | Admitting: Cardiovascular Disease

## 2022-04-07 ENCOUNTER — Ambulatory Visit (HOSPITAL_COMMUNITY)
Admission: RE | Admit: 2022-04-07 | Discharge: 2022-04-07 | Disposition: A | Discharge status: Home / Self Care | Payer: Medicare HMO | Attending: Cardiovascular Disease | Admitting: Cardiovascular Disease

## 2022-04-07 ENCOUNTER — Encounter (HOSPITAL_COMMUNITY)
Admission: RE | Disposition: A | Discharge status: Home / Self Care | Payer: Self-pay | Source: Home / Self Care | Attending: Cardiovascular Disease

## 2022-04-07 ENCOUNTER — Other Ambulatory Visit: Payer: Self-pay

## 2022-04-07 ENCOUNTER — Ambulatory Visit: Admission: RE | Admit: 2022-04-07 | Payer: Medicare HMO | Source: Home / Self Care | Admitting: Cardiovascular Disease

## 2022-04-07 ENCOUNTER — Encounter: Admission: RE | Payer: Self-pay | Source: Home / Self Care

## 2022-04-07 DIAGNOSIS — I25118 Atherosclerotic heart disease of native coronary artery with other forms of angina pectoris: Secondary | ICD-10-CM | POA: Diagnosis not present

## 2022-04-07 DIAGNOSIS — I2584 Coronary atherosclerosis due to calcified coronary lesion: Secondary | ICD-10-CM | POA: Diagnosis not present

## 2022-04-07 DIAGNOSIS — R931 Abnormal findings on diagnostic imaging of heart and coronary circulation: Secondary | ICD-10-CM | POA: Diagnosis not present

## 2022-04-07 DIAGNOSIS — I10 Essential (primary) hypertension: Secondary | ICD-10-CM | POA: Diagnosis not present

## 2022-04-07 DIAGNOSIS — C8339 Diffuse large B-cell lymphoma, extranodal and solid organ sites: Secondary | ICD-10-CM | POA: Insufficient documentation

## 2022-04-07 DIAGNOSIS — E782 Mixed hyperlipidemia: Secondary | ICD-10-CM | POA: Diagnosis not present

## 2022-04-07 DIAGNOSIS — I25110 Atherosclerotic heart disease of native coronary artery with unstable angina pectoris: Secondary | ICD-10-CM | POA: Diagnosis not present

## 2022-04-07 DIAGNOSIS — Z79899 Other long term (current) drug therapy: Secondary | ICD-10-CM | POA: Diagnosis not present

## 2022-04-07 DIAGNOSIS — Z87891 Personal history of nicotine dependence: Secondary | ICD-10-CM | POA: Insufficient documentation

## 2022-04-07 DIAGNOSIS — J449 Chronic obstructive pulmonary disease, unspecified: Secondary | ICD-10-CM | POA: Insufficient documentation

## 2022-04-07 DIAGNOSIS — I200 Unstable angina: Secondary | ICD-10-CM

## 2022-04-07 HISTORY — PX: LEFT HEART CATH AND CORONARY ANGIOGRAPHY: CATH118249

## 2022-04-07 SURGERY — LEFT HEART CATH AND CORONARY ANGIOGRAPHY
Anesthesia: Moderate Sedation

## 2022-04-07 SURGERY — LEFT HEART CATH AND CORONARY ANGIOGRAPHY
Anesthesia: LOCAL

## 2022-04-07 MED ORDER — ONDANSETRON HCL 4 MG/2ML IJ SOLN: 4.0000 mg | Freq: Four times a day (QID) | INTRAMUSCULAR | Status: DC | PRN

## 2022-04-07 MED ORDER — SODIUM CHLORIDE 0.9% FLUSH: 3.0000 mL | INTRAVENOUS | Status: DC | PRN

## 2022-04-07 MED ORDER — HEPARIN (PORCINE) IN NACL 1000-0.9 UT/500ML-% IV SOLN
INTRAVENOUS | Status: DC | PRN
  Administered 2022-04-07 (×2): 500 mL

## 2022-04-07 MED ORDER — SODIUM CHLORIDE 0.9 % IV SOLN: INTRAVENOUS | Status: DC

## 2022-04-07 MED ORDER — SODIUM CHLORIDE 0.9 % IV SOLN: 250.0000 mL | INTRAVENOUS | Status: DC | PRN

## 2022-04-07 MED ORDER — HEPARIN SODIUM (PORCINE) 1000 UNIT/ML IJ SOLN
INTRAMUSCULAR | Status: DC | PRN
  Administered 2022-04-07: 5000 [IU] via INTRAVENOUS

## 2022-04-07 MED ORDER — FENTANYL CITRATE (PF) 100 MCG/2ML IJ SOLN
INTRAMUSCULAR | Status: AC
  Filled 2022-04-07: qty 2

## 2022-04-07 MED ORDER — MIDAZOLAM HCL 2 MG/2ML IJ SOLN
INTRAMUSCULAR | Status: DC | PRN
  Administered 2022-04-07: 1 mg via INTRAVENOUS

## 2022-04-07 MED ORDER — LIDOCAINE HCL (PF) 1 % IJ SOLN
INTRAMUSCULAR | Status: DC | PRN
  Administered 2022-04-07: 2 mL

## 2022-04-07 MED ORDER — IOHEXOL 350 MG/ML SOLN
INTRAVENOUS | Status: DC | PRN
  Administered 2022-04-07: 85 mL

## 2022-04-07 MED ORDER — SODIUM CHLORIDE 0.9 % WEIGHT BASED INFUSION: 1.0000 mL/kg/h | INTRAVENOUS | Status: DC

## 2022-04-07 MED ORDER — ASPIRIN 81 MG PO CHEW
81.0000 mg | CHEWABLE_TABLET | ORAL | Status: AC
  Administered 2022-04-07: 81 mg via ORAL
  Filled 2022-04-07: qty 1

## 2022-04-07 MED ORDER — HEPARIN (PORCINE) IN NACL 1000-0.9 UT/500ML-% IV SOLN
INTRAVENOUS | Status: AC
  Filled 2022-04-07: qty 500

## 2022-04-07 MED ORDER — MIDAZOLAM HCL 2 MG/2ML IJ SOLN
INTRAMUSCULAR | Status: AC
  Filled 2022-04-07: qty 2

## 2022-04-07 MED ORDER — ACETAMINOPHEN 325 MG PO TABS: 650.0000 mg | ORAL_TABLET | ORAL | Status: DC | PRN

## 2022-04-07 MED ORDER — SODIUM CHLORIDE 0.9 % WEIGHT BASED INFUSION
3.0000 mL/kg/h | INTRAVENOUS | Status: AC
  Administered 2022-04-07: 3 mL/kg/h via INTRAVENOUS

## 2022-04-07 MED ORDER — SODIUM CHLORIDE 0.9% FLUSH: 3.0000 mL | Freq: Two times a day (BID) | INTRAVENOUS | Status: DC

## 2022-04-07 MED ORDER — FENTANYL CITRATE (PF) 100 MCG/2ML IJ SOLN
INTRAMUSCULAR | Status: DC | PRN
Start: 2022-04-07 — End: 2022-04-07
  Administered 2022-04-07: 25 ug via INTRAVENOUS

## 2022-04-07 MED ORDER — LIDOCAINE HCL (PF) 1 % IJ SOLN
INTRAMUSCULAR | Status: AC
  Filled 2022-04-07: qty 30

## 2022-04-07 MED ORDER — VERAPAMIL HCL 2.5 MG/ML IV SOLN
INTRAVENOUS | Status: AC
  Filled 2022-04-07: qty 2

## 2022-04-07 MED ORDER — VERAPAMIL HCL 2.5 MG/ML IV SOLN
INTRAVENOUS | Status: DC | PRN
  Administered 2022-04-07: 10 mL via INTRA_ARTERIAL

## 2022-04-07 MED ORDER — HEPARIN SODIUM (PORCINE) 1000 UNIT/ML IJ SOLN
INTRAMUSCULAR | Status: AC
  Filled 2022-04-07: qty 10

## 2022-04-07 SURGICAL SUPPLY — 11 items
BAND ZEPHYR COMPRESS 30 LONG (HEMOSTASIS) IMPLANT
CATH 5FR PIGTAIL DIAGNOSTIC (CATHETERS) IMPLANT
CATH INFINITI 5FR JK (CATHETERS) IMPLANT
CATH INFINITI 5FR JL4 (CATHETERS) IMPLANT
GLIDESHEATH SLEND SS 6F .021 (SHEATH) IMPLANT
GUIDEWIRE INQWIRE 1.5J.035X260 (WIRE) IMPLANT
INQWIRE 1.5J .035X260CM (WIRE) ×1
KIT HEART LEFT (KITS) ×1 IMPLANT
PACK CARDIAC CATHETERIZATION (CUSTOM PROCEDURE TRAY) ×1 IMPLANT
TRANSDUCER W/STOPCOCK (MISCELLANEOUS) ×1 IMPLANT
TUBING CIL FLEX 10 FLL-RA (TUBING) ×1 IMPLANT

## 2022-04-07 NOTE — Interval H&P Note (Signed)
Cath Lab Visit (complete for each Cath Lab visit)  Clinical Evaluation Leading to the Procedure:   ACS: No.  Non-ACS:    Anginal Classification: CCS II  Anti-ischemic medical therapy: Minimal Therapy (1 class of medications)  Non-Invasive Test Results: High-risk stress test findings: cardiac mortality >3%/year  Prior CABG: No previous CABG      History and Physical Interval Note:  04/07/2022 12:02 PM  John Gallegos  has presented today for surgery, with the diagnosis of abnormal ct.  The various methods of treatment have been discussed with the patient and family. After consideration of risks, benefits and other options for treatment, the patient has consented to  Procedure(s): LEFT HEART CATH AND CORONARY ANGIOGRAPHY (N/A) as a surgical intervention.  The patient's history has been reviewed, patient examined, no change in status, stable for surgery.  I have reviewed the patient's chart and labs.  Questions were answered to the patient's satisfaction.     Kathlyn [place]

## 2022-04-07 NOTE — Progress Notes (Signed)
TR BAND REMOVAL  LOCATION:    right radial  DEFLATED PER PROTOCOL:    Yes.    TIME BAND OFF / DRESSING APPLIED: 04/07/22 at [place] ARRIVAL:    Level 0  SITE AFTER BAND REMOVAL:    Level 0  CIRCULATION SENSATION AND MOVEMENT:    Within Normal Limits   Yes.    COMMENTS:

## 2022-04-08 ENCOUNTER — Encounter (HOSPITAL_COMMUNITY): Payer: Self-pay | Admitting: Cardiovascular Disease

## 2022-04-14 DIAGNOSIS — E538 Deficiency of other specified B group vitamins: Secondary | ICD-10-CM | POA: Diagnosis not present

## 2022-04-14 DIAGNOSIS — I2584 Coronary atherosclerosis due to calcified coronary lesion: Secondary | ICD-10-CM | POA: Diagnosis not present

## 2022-04-14 DIAGNOSIS — I2510 Atherosclerotic heart disease of native coronary artery without angina pectoris: Secondary | ICD-10-CM | POA: Diagnosis not present

## 2022-04-14 DIAGNOSIS — M1711 Unilateral primary osteoarthritis, right knee: Secondary | ICD-10-CM | POA: Diagnosis not present

## 2022-04-14 DIAGNOSIS — R2689 Other abnormalities of gait and mobility: Secondary | ICD-10-CM | POA: Diagnosis not present

## 2022-04-14 DIAGNOSIS — H9190 Unspecified hearing loss, unspecified ear: Secondary | ICD-10-CM | POA: Diagnosis not present

## 2022-04-14 DIAGNOSIS — C8330 Diffuse large B-cell lymphoma, unspecified site: Secondary | ICD-10-CM | POA: Diagnosis not present

## 2022-04-14 DIAGNOSIS — E785 Hyperlipidemia, unspecified: Secondary | ICD-10-CM | POA: Diagnosis not present

## 2022-04-14 DIAGNOSIS — I5030 Unspecified diastolic (congestive) heart failure: Secondary | ICD-10-CM | POA: Diagnosis not present

## 2022-04-15 ENCOUNTER — Ambulatory Visit: Payer: Medicare HMO | Admitting: Family

## 2022-04-29 DIAGNOSIS — I5032 Chronic diastolic (congestive) heart failure: Secondary | ICD-10-CM | POA: Diagnosis not present

## 2022-04-29 DIAGNOSIS — E7849 Other hyperlipidemia: Secondary | ICD-10-CM | POA: Diagnosis not present

## 2022-04-29 DIAGNOSIS — I2510 Atherosclerotic heart disease of native coronary artery without angina pectoris: Secondary | ICD-10-CM | POA: Diagnosis not present

## 2022-04-29 DIAGNOSIS — I700 Atherosclerosis of aorta: Secondary | ICD-10-CM | POA: Diagnosis not present

## 2022-04-29 DIAGNOSIS — I10 Essential (primary) hypertension: Secondary | ICD-10-CM | POA: Diagnosis not present

## 2022-04-29 DIAGNOSIS — M1990 Unspecified osteoarthritis, unspecified site: Secondary | ICD-10-CM | POA: Diagnosis not present

## 2022-05-17 ENCOUNTER — Ambulatory Visit: Payer: Medicare HMO | Attending: Medical | Admitting: Medical

## 2022-05-17 ENCOUNTER — Encounter: Payer: Self-pay | Admitting: Medical

## 2022-05-17 VITALS — BP 130/72 | HR 65 | Ht 73.0 in | Wt 229.0 lb

## 2022-05-17 DIAGNOSIS — I25118 Atherosclerotic heart disease of native coronary artery with other forms of angina pectoris: Secondary | ICD-10-CM

## 2022-05-17 DIAGNOSIS — R001 Bradycardia, unspecified: Secondary | ICD-10-CM | POA: Diagnosis not present

## 2022-05-17 DIAGNOSIS — Z87898 Personal history of other specified conditions: Secondary | ICD-10-CM

## 2022-05-17 DIAGNOSIS — I10 Essential (primary) hypertension: Secondary | ICD-10-CM | POA: Diagnosis not present

## 2022-05-17 DIAGNOSIS — I5032 Chronic diastolic (congestive) heart failure: Secondary | ICD-10-CM | POA: Diagnosis not present

## 2022-05-17 NOTE — Patient Instructions (Signed)
Medication Instructions:   Your physician recommends that you continue on your current medications as directed. Please refer to the Current Medication list given to you today.  *If you need a refill on your cardiac medications before your next appointment, please call your pharmacy*   Lab Work:  None Ordered  If you have labs (blood work) drawn today and your tests are completely normal, you will receive your results only by: [place] (if you have MyChart) OR A paper copy in the mail If you have any lab test that is abnormal or we need to change your treatment, we will call you to review the results.   Testing/Procedures:  None Ordered   Follow-Up: At [facility], you and your health needs are our priority.  As part of our continuing mission to provide you with exceptional heart care, we have created designated Provider Care Teams.  These Care Teams include your primary Cardiologist (physician) and Advanced Practice Providers (APPs -  Physician Assistants and Nurse Practitioners) who all work together to provide you with the care you need, when you need it.  We recommend signing up for the patient portal called "MyChart".  Sign up information is provided on this After Visit Summary.  MyChart is used to connect with patients for Virtual Visits (Telemedicine).  Patients are able to view lab/test results, encounter notes, upcoming appointments, etc.  Non-urgent messages can be sent to your provider as well.   To learn more about what you can do with MyChart, go to [email or web address].    Your next appointment:   3 - 4 month(s)  The format for your next appointment:   In Person  Provider:   You may see Ida Rogue, MD or one of the following Advanced Practice Providers on your designated Care Team:   Murray Hodgkins, NP Christell Faith, PA-C Cadence Kathlen Mody, PA-C Gerrie Nordmann, NP

## 2022-05-17 NOTE — Progress Notes (Signed)
Cardiology Office Note:    Date:  05/17/2022   ID:  John Gallegos, DOB [birth year], MRN [id number]  PCP:  Tracie Harrier, MD  [facility] Cardiologist:  Ida Rogue, MD  [facility] Electrophysiologist:  None   Referring MD: Tracie Harrier, MD   Chief Complaint: 1 month follow-up  History of Present Illness:    John Gallegos is a 76 y.o. male with a hx of HTN, diastolic dysfunction, nonobstructive CAD, non-Hodgkin's lymphoma status post chemotherapy, remote tobacco abuse, and gout, who is being seen today for 1 month follow-up.   He was noted to have significant three-vessel coronary calcium on CT scan.  Prior echocardiogram in February 2019 showed an EF of 50 to 55% with elevated PASP at 37 mmHg.  Hypertension has been managed with carvedilol, doxazosin, hydralazine, and losartan HCTZ in the outpatient setting.   He was admitted 11/2021 with hypertensive urgency and acute diastolic heart failure. Treated with IV lasix. Troponin was elevated. Echo showed preserved EF. BB was held for bradcyardia. Outpatient icshemic testing was recommended. Cardiac CTA was ordered, which showed a very high calcium score 5071, 97th percentile for age and sex matched, heavily calcified 3 vessel CAD, severe stenosis >70% in pRCA and LAD, moderate LCX stenosis 50-69%. Cardiac cath was recommended.   LHC 04/07/22 showed  heavily calcified coronary arteries with moderate 3V CAD, normal LVSF and moderately elevated LVEDP. No targets for PCI noted, recommended medical therapy.   Today, the cardiac cath was reviewed in detail. He denies chest pain or shortness  of breath. Right radial cath site has healed well. No lower leg edema, orthopnea or pnd.   Past Medical History:  Diagnosis Date   CHF (congestive heart failure) (HCC)    COPD (chronic obstructive pulmonary disease) ([place])    a. Quit smoking ~ 2018.   Diastolic dysfunction    a. 09/2017 Echo: EF 50-55%, mild RM, PASP 67mHg.   Gout     Hard of hearing    Hiatal hernia    Hypertension    [place]   B-cell, chemo tx's and in remission in 2019   Urinary incontinence    frequency    Past Surgical History:  Procedure Laterality Date   CYSTOSCOPY W/ RETROGRADES Left 08/19/2017   Procedure: CYSTOSCOPY WITH RETROGRADE PYELOGRAM;  Surgeon: SAbbie Sons MD;  Location: [facility] ORS;  Service: Urology;  Laterality: Left;   CYSTOSCOPY W/ URETERAL STENT REMOVAL Left 08/19/2017   Procedure: CYSTOSCOPY WITH STENT REMOVAL;  Surgeon: SAbbie Sons MD;  Location: [facility] ORS;  Service: Urology;  Laterality: Left;   CYSTOSCOPY WITH BIOPSY Left 05/13/2017   Procedure: CYSTOSCOPY WITH URETERAL BIOPSY;  Surgeon: BNickie Retort MD;  Location: [facility] ORS;  Service: Urology;  Laterality: Left;   CYSTOSCOPY WITH STENT PLACEMENT Left 05/13/2017   Procedure: CYSTOSCOPY WITH STENT PLACEMENT;  Surgeon: BNickie Retort MD;  Location: [facility] ORS;  Service: Urology;  Laterality: Left;   LEFT HEART CATH AND CORONARY ANGIOGRAPHY N/A 04/07/2022   Procedure: LEFT HEART CATH AND CORONARY ANGIOGRAPHY;  Surgeon: AWellington Hampshire MD;  [facility];  Service: Cardiovascular;  Laterality: N/A;   PORTA CATH INSERTION N/A 06/14/2017   Procedure: PORTA CATH INSERTION;  Surgeon: DAlgernon Huxley MD;  [facility];  Service: Cardiovascular;  Laterality: N/A;   PORTA CATH REMOVAL N/A 02/25/2020   Procedure: PORTA CATH REMOVAL;  Surgeon: DAlgernon Huxley MD;  [facility];  Service: Cardiovascular;  Laterality: N/A;   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   cholecalciferol (VITAMIN D3) 25 MCG (1000 UNIT) tablet Take 1,000 Units by mouth 3 (three) times a week.   doxazosin (CARDURA) 2 MG tablet TAKE 1 TABLET(2 MG) BY MOUTH TWICE DAILY   furosemide (LASIX) 20 MG tablet Take 20 mg by mouth 3 (three) times a week.   gabapentin (NEURONTIN) 300 MG capsule TAKE 1 CAPSULE(300 MG) BY MOUTH AT BEDTIME   hydrALAZINE (APRESOLINE) 100 MG tablet Take 100 mg by mouth 2 (two) times daily.   losartan (COZAAR) 100 MG tablet Take 1 tablet (100 mg total) by mouth daily.   nitroGLYCERIN (NITROSTAT) 0.4 MG SL tablet Place 1 tablet (0.4 mg total) under the tongue every 5 (five) minutes as needed for chest pain.   potassium chloride SA (KLOR-CON M) 20 MEQ tablet Take 1 tablet (20 mEq total) by mouth daily. Monday, Wednesday and Friday.   pravastatin (PRAVACHOL) 40 MG tablet Take 1 tablet (40 mg total) by mouth at bedtime.   spironolactone (ALDACTONE) 25 MG tablet Take 1 tablet (25 mg total) by mouth daily.   vitamin B-12 (CYANOCOBALAMIN) 100 MCG tablet Take 100 mcg by mouth 3 (three) times a week.   zolpidem (AMBIEN) 10 MG tablet Take 10 mg by mouth at bedtime.   [DISCONTINUED] furosemide (LASIX) 40 MG tablet Take 0.5 tablets (20 mg total) by mouth daily. (Patient taking differently: Take 20 mg by mouth every other day.)   Current Facility-Administered Medications for the 05/17/22 encounter (Office Visit) with Kathlen Mody, Keyshia Orwick H, PA-C  Medication   sodium chloride flush (NS) 0.9 % injection 3 mL     Allergies:   Amlodipine   Social History   Socioeconomic History   Marital status: Legally Separated    Spouse name: Not on file   Number of children: Not on file   Years of education: Not on file   Highest education level: Not on file  Occupational History   Not on file  Tobacco Use    Smoking status: Former    Packs/day: 0.50    Years: 50.00    Total pack years: 25.00    Types: Cigarettes    Quit date: 12/12/2016    Years since quitting: 5.4   Smokeless tobacco: Never  Vaping Use   Vaping Use: Never used  Substance and Sexual Activity   Alcohol use: Yes    Alcohol/week: 2.0 standard drinks of alcohol    Types: 2 Shots of liquor per week    Comment: vodka and cranberry juice-one drink daily (10 oz glass)   Drug use: No   Sexual activity: Not on file  Other Topics Concern   Not on file  Social History Narrative   Lives locally in apartment.  Divorced but he and his ex-wife are close friends and they live in the same apt complex.  He does  not routinely exercise.   Social Determinants of Health   Financial Resource Strain: Low Risk  (03/18/2022)   Overall Financial Resource Strain (CARDIA)    Difficulty of Paying Living Expenses: Not hard at all  Food Insecurity: No Food Insecurity (03/18/2022)   Hunger Vital Sign    Worried About Running Out of Food in the Last Year: Never true    Ran Out of Food in the Last Year: Never true  Transportation Needs: No Transportation Needs (03/18/2022)   PRAPARE - Hydrologist (Medical): No    Lack of Transportation (Non-Medical): No  Physical Activity: Unknown (03/18/2022)   Exercise Vital Sign    Days of Exercise per Week: 0 days    Minutes of Exercise per Session: Not on file  Stress: No Stress Concern Present (03/18/2022)   [place]    Feeling of Stress : Not at all  Social Connections: Socially Isolated (03/18/2022)   Social Connection and Isolation Panel [NHANES]    Frequency of Communication with Friends and Family: More than three times a week    Frequency of Social Gatherings with Friends and Family: More than three times a week    Attends Religious Services: Never    Marine scientist or Organizations: No    Attends  Archivist Meetings: Never    Marital Status: Separated     Family History: The patient's family history includes Aneurysm in his mother; Heart attack in his mother; Lung cancer in his father; Lymphoma in his sister; Pancreatic cancer in his mother. There is no history of Prostate cancer, Kidney cancer, or Bladder Cancer.  ROS:   Please see the history of present illness.     All other systems reviewed and are negative.  EKGs/Labs/Other Studies Reviewed:    The following studies were reviewed today:  LHC 03/2022     Prox RCA-1 lesion is 60% stenosed.   Prox RCA-2 lesion is 50% stenosed.   Mid RCA lesion is 40% stenosed.   Dist RCA lesion is 60% stenosed.   Mid Cx to Dist Cx lesion is 70% stenosed.   1st Mrg lesion is 60% stenosed.   Prox LAD lesion is 50% stenosed.   Mid LAD lesion is 30% stenosed.   The left ventricular systolic function is normal.   LV end diastolic pressure is moderately elevated.   The left ventricular ejection fraction is 55-65% by visual estimate.   1.  Heavily calcified coronary arteries with moderate three-vessel coronary artery disease. 2.  Normal LV systolic function and moderately elevated left ventricular end-diastolic pressure 24 mmHg.   Recommendations: The patient has diffuse heavily calcified disease but there is no critical stenosis that requires revascularization.  Recommend aggressive medical therapy.  Consider increasing furosemide given elevated LVEDP.    Coronary Diagrams  Diagnostic Dominance: Right    Echo 11/2021   1. Left ventricular ejection fraction, by estimation, is 60 to 65%. The  left ventricle has normal function. Left ventricular endocardial border  not optimally defined to evaluate regional wall motion. There is severe  asymmetric left ventricular  hypertrophy of the septal segment. Left ventricular diastolic parameters  are consistent with Grade II diastolic dysfunction (pseudonormalization).  Elevated left  atrial pressure.   2. Right ventricular systolic function is normal. The right ventricular  size is not well visualized. Tricuspid regurgitation signal is inadequate  for assessing PA pressure.   3. Left atrial size  was mildly dilated.   4. Right atrial size was mildly dilated.   5. The mitral valve was not well visualized. No evidence of mitral valve  regurgitation. No evidence of mitral stenosis.   6. The aortic valve was not well visualized. Aortic valve regurgitation  is not visualized. No aortic stenosis is present.    EKG:  EKG is ordered today.  The ekg ordered today demonstrates NSR 61bpm, nonspecific T wave changes, Qtc 41m  Recent Labs: 11/30/2021: B Natriuretic Peptide 873.1 12/01/2021: Magnesium 2.1 02/25/2022: ALT 11 03/31/2022: BUN 14; Creatinine, Ser 1.05; Hemoglobin 13.1; Platelets 181; Potassium 4.2; Sodium 141  Recent Lipid Panel No results found for: "CHOL", "TRIG", "HDL", "CHOLHDL", "VLDL", "LDLCALC", "LDLDIRECT"   Physical Exam:    VS:  BP 130/72   Pulse 65   Ht '6\' 1"'$  (1.854 m)   Wt 229 lb (103.9 kg)   SpO2 97%   BMI 30.21 kg/m     Wt Readings from Last 3 Encounters:  05/17/22 229 lb (103.9 kg)  03/29/22 226 lb 8 oz (102.7 kg)  03/22/22 224 lb 6.4 oz (101.8 kg)     GEN:  Well nourished, well developed in no acute distress HEENT: Normal NECK: No JVD; No carotid bruits LYMPHATICS: No lymphadenopathy CARDIAC: RRR, no murmurs, rubs, gallops RESPIRATORY:  Clear to auscultation without rales, wheezing or rhonchi  ABDOMEN: Soft, non-tender, non-distended MUSCULOSKELETAL:  No edema; No deformity  SKIN: Warm and dry NEUROLOGIC:  Alert and oriented x 3 PSYCHIATRIC:  Normal affect   ASSESSMENT:    1. Coronary artery disease of native artery of native heart with stable angina pectoris ([place]   2. Chronic diastolic heart failure ([place]   3. Sinus bradycardia   4. H/O prolonged Q-T interval on ECG   5. Essential hypertension    PLAN:    In order of  problems listed above:  Non-obstructive CAD LHC showed heavily calcified CAD with no targets for PCI, normal LVSF and moderately elevated LVEDP. Continue medical management. The patient denies anginal symptoms. Continue Aspirin and statin. No BB with sinus bradycardia.  Chronic diastolic heart failure LVEDP moderately elevated on heart cath. Patient says he has been on lasix '40mg'$  in the past and labs showed dehydration. We will continue lasix '20mg'$  daily and spironolactone '25mg'$  daily. Continue Losartan.   HTN BP is good today, continue Cardura, Hydralazine, Losartan and spironolactone.    Sinus bradycardia Prolonged Qtc EKG shows NSR 61bpm, Qtc 4740m     Disposition: Follow up in 3-4 month(s) with MD/APP   Signed, Zackari Ruane H Ninfa MeekerPA-C  05/17/2022 11:58 AM    [facility]

## 2022-06-02 ENCOUNTER — Ambulatory Visit: Payer: Self-pay

## 2022-06-02 DIAGNOSIS — M1990 Unspecified osteoarthritis, unspecified site: Secondary | ICD-10-CM | POA: Diagnosis not present

## 2022-06-02 DIAGNOSIS — I10 Essential (primary) hypertension: Secondary | ICD-10-CM | POA: Diagnosis not present

## 2022-06-02 DIAGNOSIS — I2510 Atherosclerotic heart disease of native coronary artery without angina pectoris: Secondary | ICD-10-CM | POA: Diagnosis not present

## 2022-06-02 DIAGNOSIS — I5032 Chronic diastolic (congestive) heart failure: Secondary | ICD-10-CM | POA: Diagnosis not present

## 2022-06-02 DIAGNOSIS — I700 Atherosclerosis of aorta: Secondary | ICD-10-CM | POA: Diagnosis not present

## 2022-06-02 NOTE — Patient Instructions (Signed)
Visit Information  Thank you for taking time to visit with me today. Please don't hesitate to contact me if I can be of assistance to you.   Following are the goals we discussed today:   Goals Addressed             This Visit's Progress    RNCM: Effective Management of pain       Care Coordination Interventions:  The patient rates his back pain at a 2/3 today and rates his bilateral leg, neuropathy pain at an 8 today, states the right is worse than the left. The patient states it stays about the same.  Reviewed provider established plan for pain management. The patient does take gabapentin at bedtime and also Etodolac 400 mg as needed. He says he is able to live with it but it is annoying. The patient takes ambien at night also. The patient sees the pcp on a regular basis. Denies any acute changes in pain.  Discussed importance of adherence to all scheduled medical appointments: 08-18-2022 at 1030 am with pcp Counseled on the importance of reporting any/all new or changed pain symptoms or management strategies to pain management provider Advised patient to report to care team affect of pain on daily activities. The patient does not do any structured activities. He does plan on working out in the gym on the treadmill when he feels more confident. He has a nice gym at the apartments he stays at. Encouraged activity. Discussed use of relaxation techniques and/or diversional activities to assist with pain reduction (distraction, imagery, relaxation, massage, acupressure, TENS, heat, and cold application. Discussed the use of compression socks to help with circulation and pain in legs. The patient has used these in the past but not recently. The patient states that he will try this out and see if it will help. Reviewed with patient prescribed pharmacological and nonpharmacological pain relief strategies. Patient has tried several different things. The patient states Meloxicam does not help him at all.  Encouraged the patient to discuss new options with the provider at next appointment Advised patient to discuss unresolved pain, changes in level or intensity of pain, other pain relief options available  with provider Screening for signs and symptoms of depression related to chronic disease state  Assessed social determinant of health barriers           Our next appointment is by telephone on 07-15-2022 at 330 pm   Please call the care guide team at [phone] if you need to cancel or reschedule your appointment.   If you are experiencing a Mental Health or [place] or need someone to talk to, please call the Suicide and Crisis Lifeline: 988 call the [place] National Suicide Prevention Lifeline: (240)158-5894 or TTY: 669-426-1650 TTY (928) 801-1057) to talk to a trained counselor call 1-800-273-TALK (toll free, 24 hour hotline)  The patient verbalized understanding of instructions, educational materials, and care plan provided today and DECLINED offer to receive copy of patient instructions, educational materials, and care plan.   Telephone follow up appointment with care management team member scheduled for: 07-15-2022 at 330 pm

## 2022-06-02 NOTE — Patient Outreach (Signed)
  Care Coordination   Follow Up Visit Note   06/02/2022 Name: John Gallegos MRN: [id number] DOB: Feb 10, 1946  John Gallegos is a 76 y.o. year old male who sees John Harrier, MD for primary care. I spoke with  John Gallegos by phone today.  What matters to the patients health and wellness today?  Controlling pain and discomfort    Goals Addressed             This Visit's Progress    RNCM: Effective Management of pain       Care Coordination Interventions:  The patient rates his back pain at a 2/3 today and rates his bilateral leg, neuropathy pain at an 8 today, states the right is worse than the left. The patient states it stays about the same.  Reviewed provider established plan for pain management. The patient does take gabapentin at bedtime and also Etodolac 400 mg as needed. He says he is able to live with it but it is annoying. The patient takes ambien at night also. The patient sees the pcp on a regular basis. Denies any acute changes in pain.  Discussed importance of adherence to all scheduled medical appointments: 08-18-2022 at 1030 am with pcp Counseled on the importance of reporting any/all new or changed pain symptoms or management strategies to pain management provider Advised patient to report to care team affect of pain on daily activities. The patient does not do any structured activities. He does plan on working out in the gym on the treadmill when he feels more confident. He has a nice gym at the apartments he stays at. Encouraged activity. Discussed use of relaxation techniques and/or diversional activities to assist with pain reduction (distraction, imagery, relaxation, massage, acupressure, TENS, heat, and cold application. Discussed the use of compression socks to help with circulation and pain in legs. The patient has used these in the past but not recently. The patient states that he will try this out and see if it will help. Reviewed with patient  prescribed pharmacological and nonpharmacological pain relief strategies. Patient has tried several different things. The patient states Meloxicam does not help him at all. Encouraged the patient to discuss new options with the provider at next appointment Advised patient to discuss unresolved pain, changes in level or intensity of pain, other pain relief options available  with provider Screening for signs and symptoms of depression related to chronic disease state  Assessed social determinant of health barriers           SDOH assessments and interventions completed:  Yes  SDOH Interventions Today    Flowsheet Row Most Recent Value  SDOH Interventions   Utilities Interventions Intervention Not Indicated        Care Coordination Interventions Activated:  Yes  Care Coordination Interventions:  Yes, provided   Follow up plan: Follow up call scheduled for 07-15-2022 at 330 pm    Encounter Outcome:  Pt. Visit Completed   John Larsson RN, MSN, [facility]  Mobile: [phone]

## 2022-07-12 DIAGNOSIS — I5032 Chronic diastolic (congestive) heart failure: Secondary | ICD-10-CM | POA: Diagnosis not present

## 2022-07-12 DIAGNOSIS — M1990 Unspecified osteoarthritis, unspecified site: Secondary | ICD-10-CM | POA: Diagnosis not present

## 2022-07-12 DIAGNOSIS — I700 Atherosclerosis of aorta: Secondary | ICD-10-CM | POA: Diagnosis not present

## 2022-07-12 DIAGNOSIS — I2510 Atherosclerotic heart disease of native coronary artery without angina pectoris: Secondary | ICD-10-CM | POA: Diagnosis not present

## 2022-07-12 DIAGNOSIS — I10 Essential (primary) hypertension: Secondary | ICD-10-CM | POA: Diagnosis not present

## 2022-07-15 ENCOUNTER — Ambulatory Visit: Payer: Self-pay | Admitting: *Deleted

## 2022-07-15 NOTE — Patient Outreach (Signed)
  Care Coordination   07/15/2022 Name: AIKAM VINJE MRN: [id number] DOB: Jan 24, 1946   Care Coordination Outreach Attempts:  An unsuccessful telephone outreach was attempted for a scheduled appointment today.  Follow Up Plan:  Additional outreach attempts will be made to offer the patient care coordination information and services.   Encounter Outcome:  No Answer   Care Coordination Interventions:  No, not indicated    Valente David, RN, MSN, [facility] Care Management Care Management Coordinator [phone]

## 2022-07-15 NOTE — Patient Outreach (Signed)
  Care Coordination   Follow Up Visit Note   07/15/2022 Name: John Gallegos MRN: [id number] DOB: 1946/05/15  John Gallegos is a 76 y.o. year old male who sees John Harrier, MD for primary care. I spoke with  John Gallegos by phone today.  What matters to the patients health and wellness today?  Still has ongoing pain issues, feels ok today.      Goals Addressed             This Visit's Progress    RNCM: Effective Management of pain   On track    Care Coordination Interventions:  The patient rates his neuropathy pain 7/10 today.  State it is always there but manageable.    Reviewed provider established plan for pain management. The patient does take gabapentin at bedtime and also Etodolac 400 mg as needed.  Denies any acute changes in pain.  Discussed importance of adherence to all scheduled medical appointments: 08-18-2022 at 1030 am with pcp and February with cardiology Counseled on the importance of reporting any/all new or changed pain symptoms or management strategies to pain management provider Advised patient to report to care team affect of pain on daily activities.  Discussed use of relaxation techniques and/or diversional activities to assist with pain reduction (distraction, imagery, relaxation, massage, acupressure, TENS, heat, and cold application. Discussed the use of compression socks to help with circulation and pain in legs. State it is hard to get compression socks on but he has been using ace wraps for compression.  This has helped some with pain as well as some right leg swelling.  He will discuss swelling with PCP Reviewed with patient prescribed pharmacological and nonpharmacological pain relief strategies. Patient has tried several different things.  Advised patient to discuss unresolved pain, changes in level or intensity of pain, other pain relief options available  with provider            SDOH assessments and interventions completed:   No     Care Coordination Interventions:  Yes, provided   Follow up plan: Follow up call scheduled for 3/7    Encounter Outcome:  Pt. Visit Completed   John David, RN, MSN, [place] [facility] Care Management Coordinator [phone]

## 2022-08-10 DIAGNOSIS — H5211 Myopia, right eye: Secondary | ICD-10-CM | POA: Diagnosis not present

## 2022-08-10 DIAGNOSIS — Z0100 Encounter for examination of eyes and vision without abnormal findings: Secondary | ICD-10-CM | POA: Diagnosis not present

## 2022-08-11 DIAGNOSIS — I5032 Chronic diastolic (congestive) heart failure: Secondary | ICD-10-CM | POA: Diagnosis not present

## 2022-08-11 DIAGNOSIS — D649 Anemia, unspecified: Secondary | ICD-10-CM | POA: Diagnosis not present

## 2022-08-11 DIAGNOSIS — R7989 Other specified abnormal findings of blood chemistry: Secondary | ICD-10-CM | POA: Diagnosis not present

## 2022-08-11 DIAGNOSIS — H9193 Unspecified hearing loss, bilateral: Secondary | ICD-10-CM | POA: Diagnosis not present

## 2022-08-11 DIAGNOSIS — I2510 Atherosclerotic heart disease of native coronary artery without angina pectoris: Secondary | ICD-10-CM | POA: Diagnosis not present

## 2022-08-11 DIAGNOSIS — C8339 Diffuse large B-cell lymphoma, extranodal and solid organ sites: Secondary | ICD-10-CM | POA: Diagnosis not present

## 2022-08-11 DIAGNOSIS — E7849 Other hyperlipidemia: Secondary | ICD-10-CM | POA: Diagnosis not present

## 2022-08-11 DIAGNOSIS — M1990 Unspecified osteoarthritis, unspecified site: Secondary | ICD-10-CM | POA: Diagnosis not present

## 2022-08-11 DIAGNOSIS — R2689 Other abnormalities of gait and mobility: Secondary | ICD-10-CM | POA: Diagnosis not present

## 2022-08-18 DIAGNOSIS — E538 Deficiency of other specified B group vitamins: Secondary | ICD-10-CM | POA: Diagnosis not present

## 2022-08-18 DIAGNOSIS — R7989 Other specified abnormal findings of blood chemistry: Secondary | ICD-10-CM | POA: Diagnosis not present

## 2022-08-18 DIAGNOSIS — G4700 Insomnia, unspecified: Secondary | ICD-10-CM | POA: Diagnosis not present

## 2022-08-18 DIAGNOSIS — M1711 Unilateral primary osteoarthritis, right knee: Secondary | ICD-10-CM | POA: Diagnosis not present

## 2022-08-18 DIAGNOSIS — I110 Hypertensive heart disease with heart failure: Secondary | ICD-10-CM | POA: Diagnosis not present

## 2022-08-18 DIAGNOSIS — C8339 Diffuse large B-cell lymphoma, extranodal and solid organ sites: Secondary | ICD-10-CM | POA: Diagnosis not present

## 2022-08-18 DIAGNOSIS — R2689 Other abnormalities of gait and mobility: Secondary | ICD-10-CM | POA: Diagnosis not present

## 2022-08-18 DIAGNOSIS — Z1331 Encounter for screening for depression: Secondary | ICD-10-CM | POA: Diagnosis not present

## 2022-08-18 DIAGNOSIS — I5030 Unspecified diastolic (congestive) heart failure: Secondary | ICD-10-CM | POA: Diagnosis not present

## 2022-08-18 DIAGNOSIS — Z0000 Encounter for general adult medical examination without abnormal findings: Secondary | ICD-10-CM | POA: Diagnosis not present

## 2022-08-18 DIAGNOSIS — H918X1 Other specified hearing loss, right ear: Secondary | ICD-10-CM | POA: Diagnosis not present

## 2022-08-23 DIAGNOSIS — G6289 Other specified polyneuropathies: Secondary | ICD-10-CM | POA: Diagnosis not present

## 2022-08-23 DIAGNOSIS — G8929 Other chronic pain: Secondary | ICD-10-CM | POA: Diagnosis not present

## 2022-08-23 DIAGNOSIS — M5416 Radiculopathy, lumbar region: Secondary | ICD-10-CM | POA: Diagnosis not present

## 2022-08-23 DIAGNOSIS — M5442 Lumbago with sciatica, left side: Secondary | ICD-10-CM | POA: Diagnosis not present

## 2022-08-23 DIAGNOSIS — M5441 Lumbago with sciatica, right side: Secondary | ICD-10-CM | POA: Diagnosis not present

## 2022-08-23 DIAGNOSIS — M47816 Spondylosis without myelopathy or radiculopathy, lumbar region: Secondary | ICD-10-CM | POA: Diagnosis not present

## 2022-08-30 ENCOUNTER — Encounter: Payer: Self-pay | Admitting: Oncology

## 2022-08-30 ENCOUNTER — Inpatient Hospital Stay: Payer: Medicare HMO | Attending: Oncology

## 2022-08-30 ENCOUNTER — Inpatient Hospital Stay: Payer: Medicare HMO | Admitting: Oncology

## 2022-08-30 VITALS — BP 159/90 | HR 58 | Temp 96.9°F | Resp 18 | Wt 233.9 lb

## 2022-08-30 DIAGNOSIS — C8339 Diffuse large B-cell lymphoma, extranodal and solid organ sites: Secondary | ICD-10-CM | POA: Insufficient documentation

## 2022-08-30 DIAGNOSIS — G8929 Other chronic pain: Secondary | ICD-10-CM | POA: Diagnosis not present

## 2022-08-30 DIAGNOSIS — F17200 Nicotine dependence, unspecified, uncomplicated: Secondary | ICD-10-CM | POA: Diagnosis not present

## 2022-08-30 DIAGNOSIS — C83398 Diffuse large b-cell lymphoma of other extranodal and solid organ sites: Secondary | ICD-10-CM

## 2022-08-30 DIAGNOSIS — I5030 Unspecified diastolic (congestive) heart failure: Secondary | ICD-10-CM | POA: Diagnosis not present

## 2022-08-30 DIAGNOSIS — I110 Hypertensive heart disease with heart failure: Secondary | ICD-10-CM | POA: Insufficient documentation

## 2022-08-30 DIAGNOSIS — Z789 Other specified health status: Secondary | ICD-10-CM

## 2022-08-30 DIAGNOSIS — F1090 Alcohol use, unspecified, uncomplicated: Secondary | ICD-10-CM

## 2022-08-30 DIAGNOSIS — Z79899 Other long term (current) drug therapy: Secondary | ICD-10-CM | POA: Insufficient documentation

## 2022-08-30 DIAGNOSIS — R269 Unspecified abnormalities of gait and mobility: Secondary | ICD-10-CM | POA: Diagnosis not present

## 2022-08-30 DIAGNOSIS — E538 Deficiency of other specified B group vitamins: Secondary | ICD-10-CM | POA: Diagnosis not present

## 2022-08-30 DIAGNOSIS — Z801 Family history of malignant neoplasm of trachea, bronchus and lung: Secondary | ICD-10-CM | POA: Diagnosis not present

## 2022-08-30 DIAGNOSIS — I2510 Atherosclerotic heart disease of native coronary artery without angina pectoris: Secondary | ICD-10-CM | POA: Insufficient documentation

## 2022-08-30 DIAGNOSIS — G629 Polyneuropathy, unspecified: Secondary | ICD-10-CM | POA: Diagnosis not present

## 2022-08-30 LAB — LACTATE DEHYDROGENASE: LDH: 136 U/L (ref 98–192)

## 2022-08-30 LAB — COMPREHENSIVE METABOLIC PANEL
ALT: 10 U/L (ref 0–44)
AST: 15 U/L (ref 15–41)
Albumin: 3.4 g/dL — ABNORMAL LOW (ref 3.5–5.0)
Alkaline Phosphatase: 73 U/L (ref 38–126)
Anion gap: 6 (ref 5–15)
BUN: 20 mg/dL (ref 8–23)
CO2: 26 mmol/L (ref 22–32)
Calcium: 8.8 mg/dL — ABNORMAL LOW (ref 8.9–10.3)
Chloride: 107 mmol/L (ref 98–111)
Creatinine, Ser: 1.03 mg/dL (ref 0.61–1.24)
GFR, Estimated: >60 mL/min (ref >60)
Glucose, Bld: 103 mg/dL — ABNORMAL HIGH (ref 70–99)
Potassium: 3.9 mmol/L (ref 3.5–5.1)
Sodium: 139 mmol/L (ref 135–145)
Total Bilirubin: 1 mg/dL (ref 0.3–1.2)
Total Protein: 6.9 g/dL (ref 6.5–8.1)

## 2022-08-30 LAB — CBC WITH DIFFERENTIAL/PLATELET
Abs Immature Granulocytes: 0.03 10*3/uL (ref 0.00–0.07)
Basophils Absolute: 0 10*3/uL (ref 0.0–0.1)
Basophils Relative: 1 %
Eosinophils Absolute: 0.2 10*3/uL (ref 0.0–0.5)
Eosinophils Relative: 3 %
HCT: 36.4 % — ABNORMAL LOW (ref 39.0–52.0)
Hemoglobin: 12.5 g/dL — ABNORMAL LOW (ref 13.0–17.0)
Immature Granulocytes: 1 %
Lymphocytes Relative: 14 %
Lymphs Abs: 0.8 10*3/uL (ref 0.7–4.0)
MCH: 30.4 pg (ref 26.0–34.0)
MCHC: 34.3 g/dL (ref 30.0–36.0)
MCV: 88.6 fL (ref 80.0–100.0)
Monocytes Absolute: 0.6 10*3/uL (ref 0.1–1.0)
Monocytes Relative: 10 %
Neutro Abs: 4.3 10*3/uL (ref 1.7–7.7)
Neutrophils Relative %: 71 %
Platelets: 163 10*3/uL (ref 150–400)
RBC: 4.11 MIL/uL — ABNORMAL LOW (ref 4.22–5.81)
RDW: 12.7 % (ref 11.5–15.5)
WBC: 5.9 10*3/uL (ref 4.0–10.5)
nRBC: 0 % (ref 0.0–0.2)

## 2022-08-30 NOTE — Progress Notes (Signed)
Hematology/Oncology follow-up note [phone] [phone]  REASON FOR VISIT Follow up for surveillance for diffuse large B-cell lymphoma.  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  5 years after he finishes treatments. He has no constitutional symptoms. Labs reviewed and discussed with patient. Patient will continue follow-up with oncology annually for clinic surveillance.  Peripheral neuropathy Patient takes gabapentin as needed  Vitamin B12 deficiency I agree with patient's primary care provider that he will need B12 supplementation.  Patient plans to start oral B12. B12 deficiency may further exacerbate his chronic gait problem.   Alcohol use Alcohol cessation recommendation was discussed with patient.   Orders Placed This Encounter  Procedures   CBC with Differential/Platelet    Standing Status:   Future    Standing Expiration Date:   08/31/2023   Comprehensive metabolic panel    Standing Status:   Future    Standing Expiration Date:   08/31/2023   Lactate dehydrogenase    Standing Status:   Future    Standing Expiration Date:   08/31/2023   Follow up in 1 year All questions were answered. The patient knows to call the clinic with any problems, questions or concerns.  Earlie Server, MD, PhD [facility] Hematology Oncology 08/30/2022    HISTORY OF PRESENTING ILLNESS:  John Gallegos is a  77 y.o.  male with PMH listed below presented for follow-up for treatment of diffuse large B cell lymphoma.  Pertinent history of oncology workup includes: Patient was found to have microscopic hematuria on 02/23/2017 with 4-10 RBC's/hpf.    Urine culture was negative.having symptoms of nocturia  He is smoker. 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 He was in the [employer] and reports to be exposed to [place] in [place]. CT scan  04/20/2017 which revealed moderate left hydronephrosis and delayed excretion down to a large 4.4 cm mass in or around the left proximal ureter. There was also hazy soft tissue in the fat surrounding the celiac axis and superior mesenteric artery. Thickening of the right diaphragmatic crus. High gastrohepatic ligament lymphadenopathy measures up to 11 mm. Peripancreatic and root of small bowel mesentery adenopathy measures up to 2.6 cm.  He was seen by urologist Dr. Pilar Jarvis and he underwent cystoscopy, left retrograde pyelogram, left ureteroscopy and placement of left ureteral stent placement on 05/13/2017. Finding during the procedure that he has extrinsic compression of the ureter, possibly from lymphoma.  # Pathology:  Biopsy of paraspinal musculature at L2. Pathology results reviewed large B cell lymphoma. It is CD20 positive, CD10 positive. Flow cytometry shows a small clonal population of large B cells positive for CD10 and absent cop and lambda light chains. The abnormal cells represent 2% of the total cells.The morphology, initial IHC panel, and flow cytometry supports classification as B-cell lymphoma, CD10 positive, with large cell features.diffuse large B-cell lymphoma, germinal center B-cell type. Discussed with pathologist Dr.Onley, Ki67 50%, MUM1 negative, MYC negative, Cycline D1 negative, CD30 negative, FISH is positive for BCL2 gene rearrangement. Negative for MYC and BCL6. 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   # Bone marrow Biopsy: negative for lymphoma involvement.  # PET scan 05/18/2017  IMPRESSION: 1. Multifocal metastatic disease within the LEFT retroperitoneum, central mesenteries, and RIGHT paraspinal musculature. 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 Recommend percutaneous biopsy of the RIGHT paraspinal musculature at  L2. 2. 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 # Interim PET scan 07/28/2017 after 2  cycles of RCHOP 1. Marked improvement, with row solution of the prior bony lesions and significant reduction in activity of the central mesenteric and right gastric adenopathy. Central mesenteric nodal is currently Deauville 3, previously Deauville 4 and Deauville 5. 2. Improvement in the right paraspinal muscular activity, currently Deauville 3, previously Deauville 4. I also discussed with radiologist that there has been further improvement in the left periureteral density, likely reflecting treatment response.   # Prognostic Model for the risk of CNS lymphoma involvement per NCCN guideline,  Patient is intermediate risk with score of 2, one from being >60, and one from more than extranodal involvement>1 sites (bone, paraspinal muscle involvement). CNS prophylaxis is usually recommended if score 4-6.  CNS prophylaxis was not offered.    #08/04/2018 CT abdomen pelvis showed no evidence of acute abnormality.  Stable stranding haziness at the root of mesentery and retroperitoneum.  No new or enlarged lymph nodes identified.  Small to moderate umbilical hernia containing fat, moderate right lower lobe atelectasis and a small loculated right pleural effusion again noted.  Cholelithiasis without CT evidence of acute cholecystitis.  Prostate enlargement.  Aortic atherosclerosis.    # Treatment:  06/21/2017 Cycle 1 RCHOP [name]. Dexamethasone '20mg'$  on Day 1,  Prednisone 80 mg daily Day 2-5. 07/12/2017 Cycle 2 RCHOP [name] .Dexamethasone '20mg'$  on Day 1, Prednisone 80 mg daily Day 2-5 08/03/2017 Cycle 3 RCHOP/onpro.Dexamethasone '20mg'$  on Day 1, Prednisone 80 mg daily Day 2-5  08/24/2017 Cycle 4 RCHOP/onpro.Dexamethasone '20mg'$  on Day 1, Prednisone 80 mg daily Day 2-5  09/14/2017 Cycle 5 RCHOP/onpro.Dexamethasone '20mg'$  on Day 1, Prednisone 80 mg daily Day 2-5  10/05/2017 Cycle 6 RCHOP/onpro.Dexamethasone '20mg'$  on Day 1, Prednisone 80 mg daily Day 2-5 # Chronic back pain, previous MRI showed diffuse lumbar spine  spondylosis.  INTERVAL HISTORY 76 y.o. male with oncology history listed as above reviewed by me today presents for follow-up for diffuse large B-cell lymphoma. Denies weight loss, fever, chills, fatigue, night sweats.  He has no new complaints. He drinks alcohol daily.  Chronic back pain. Patient continues to have balancing issue, recently worse.  Vitamin B12 levels were low and he has received B12 injections at primary care doctor's office and was recommended to start B12 oral supplementation. He follows up with cardiology for diastolic CHF, CAD.  Recently had a cardiac catheterization.  Review of Systems  Constitutional:  Positive for fatigue. Negative for appetite change, chills, fever and unexpected weight change.  HENT:   Negative for hearing loss and voice change.   Eyes:  Negative for eye problems and icterus.  Respiratory:  Negative for chest tightness, cough and shortness of breath.   Cardiovascular:  Negative for chest pain and leg swelling.  Gastrointestinal:  Negative for abdominal distention and abdominal pain.  Endocrine: Negative for hot flashes.  Genitourinary:  Negative for difficulty urinating, dysuria and frequency.   Musculoskeletal:  Positive for back pain and gait problem. Negative for arthralgias.  Skin:  Negative for itching and rash.  Neurological:  Positive for gait problem. Negative for light-headedness and numbness.  Hematological:  Negative for adenopathy. Does not bruise/bleed easily.  Psychiatric/Behavioral:  Negative for confusion.      MEDICAL HISTORY:  Past Medical History:  Diagnosis Date   CHF (congestive heart failure) (HCC)    COPD (chronic obstructive pulmonary disease) ([place])    a. Quit smoking ~ 2018.  Diastolic dysfunction    a. 09/2017 Echo: EF 50-55%, mild RM, PASP 74mHg.   Gout    Hard of hearing    Hiatal hernia    Hypertension    Non Hodgkin's lymphoma ([facility] 04/2017   B-cell, chemo tx's and in remission in 2019   Urinary  incontinence    frequency    SURGICAL HISTORY: Past Surgical History:  Procedure Laterality Date   CYSTOSCOPY W/ RETROGRADES Left 08/19/2017   Procedure: CYSTOSCOPY WITH RETROGRADE PYELOGRAM;  Surgeon: SAbbie Sons MD;  Location: [facility] ORS;  Service: Urology;  Laterality: Left;   CYSTOSCOPY W/ URETERAL STENT REMOVAL Left 08/19/2017   Procedure: CYSTOSCOPY WITH STENT REMOVAL;  Surgeon: SAbbie Sons MD;  Location: [facility] ORS;  Service: Urology;  Laterality: Left;   CYSTOSCOPY WITH BIOPSY Left 05/13/2017   Procedure: CYSTOSCOPY WITH URETERAL BIOPSY;  Surgeon: BNickie Retort MD;  Location: [facility] ORS;  Service: Urology;  Laterality: Left;   CYSTOSCOPY WITH STENT PLACEMENT Left 05/13/2017   Procedure: CYSTOSCOPY WITH STENT PLACEMENT;  Surgeon: BNickie Retort MD;  Location: [facility];  Service: Urology;  Laterality: Left;   LEFT HEART CATH AND CORONARY ANGIOGRAPHY N/A 04/07/2022   Procedure: LEFT HEART CATH AND CORONARY ANGIOGRAPHY;  Surgeon: AWellington Hampshire MD;  [facility];  Service: Cardiovascular;  Laterality: N/A;   PORTA CATH INSERTION N/A 06/14/2017   Procedure: PORTA CATH INSERTION;  Surgeon: DAlgernon Huxley MD;  [facility];  Service: Cardiovascular;  Laterality: N/A;   PORTA CATH REMOVAL N/A 02/25/2020   Procedure: PORTA CATH REMOVAL;  Surgeon: DAlgernon Huxley MD;  [facility];  Service: Cardiovascular;  Laterality: N/A;   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   right knne surgery     needle went through knee   TONSILLECTOMY     URETEROSCOPY Left 05/13/2017   Procedure: URETEROSCOPY;  Surgeon: BNickie Retort MD;  Location: [facility] ORS;  Service: Urology;  Laterality: Left;   XI ROBOTIC ASSISTED VENTRAL HERNIA N/A 05/18/2019   Procedure: XI ROBOTIC ASSISTED UMBILICAL HERNIA;  Surgeon: SBenjamine Sprague DO;  Location: [facility] OR[facility];  Service: General;  Laterality: N/A;    SOCIAL HISTORY: Social History   Socioeconomic History    Marital status: Legally Separated    Spouse name: Not on file   Number of children: Not on file   Years of education: Not on file   Highest education level: Not on file  Occupational History   Not on file  Tobacco Use   Smoking status: Former    Packs/day: 0.50    Years: 50.00    Total pack years: 25.00    Types: Cigarettes    Quit date: 12/12/2016    Years since quitting: 5.7   Smokeless tobacco: Never  Vaping Use   Vaping Use: Never used  Substance and Sexual Activity   Alcohol use: Yes    Alcohol/week: 2.0 standard drinks of alcohol    Types: 2 Shots of liquor per week    Comment: vodka and cranberry juice-one drink daily (10 oz glass)   Drug use: No   Sexual activity: Not on file  Other Topics Concern   Not on file  Social History Narrative   Lives locally in apartment.  Divorced but he and his ex-wife are close friends and they live in the same apt complex.  He does not routinely exercise.   Social Determinants of HRadio broadcast assistant  Strain: Low Risk  (03/18/2022)   Overall Financial Resource Strain (CARDIA)    Difficulty of Paying Living Expenses: Not hard at all  Food Insecurity: No Food Insecurity (03/18/2022)   Hunger Vital Sign    Worried About Running Out of Food in the Last Year: Never true    Ran Out of Food in the Last Year: Never true  Transportation Needs: No Transportation Needs (03/18/2022)   PRAPARE - Hydrologist (Medical): No    Lack of Transportation (Non-Medical): No  Physical Activity: Unknown (03/18/2022)   Exercise Vital Sign    Days of Exercise per Week: 0 days    Minutes of Exercise per Session: Not on file  Stress: No Stress Concern Present (03/18/2022)   [place]    Feeling of Stress : Not at all  Social Connections: Socially Isolated (03/18/2022)   Social Connection and Isolation Panel [NHANES]    Frequency of Communication with Friends and  Family: More than three times a week    Frequency of Social Gatherings with Friends and Family: More than three times a week    Attends Religious Services: Never    Marine scientist or Organizations: No    Attends Archivist Meetings: Never    Marital Status: Separated  Intimate Partner Violence: Not At Risk (03/18/2022)   Humiliation, Afraid, Rape, and Kick questionnaire    Fear of Current or Ex-Partner: No    Emotionally Abused: No    Physically Abused: No    Sexually Abused: No    FAMILY HISTORY: Family History  Problem Relation Age of Onset   Pancreatic cancer Mother        died @ 28   Aneurysm Mother    Heart attack Mother    Lung cancer Father    Lymphoma Sister    Prostate cancer Neg Hx    Kidney cancer Neg Hx    Bladder Cancer Neg Hx     ALLERGIES:  is allergic to amlodipine.  MEDICATIONS:  Current Outpatient Medications  Medication Sig Dispense Refill   aspirin EC 81 MG tablet Take 81 mg by mouth daily.     cholecalciferol (VITAMIN D3) 25 MCG (1000 UNIT) tablet Take 1,000 Units by mouth 3 (three) times a week.     doxazosin (CARDURA) 2 MG tablet TAKE 1 TABLET(2 MG) BY MOUTH TWICE DAILY 180 tablet 0   furosemide (LASIX) 20 MG tablet Take 20 mg by mouth 3 (three) times a week.     gabapentin (NEURONTIN) 300 MG capsule TAKE 1 CAPSULE(300 MG) BY MOUTH AT BEDTIME 90 capsule 1   hydrALAZINE (APRESOLINE) 100 MG tablet Take 100 mg by mouth 2 (two) times daily.     losartan (COZAAR) 100 MG tablet Take 1 tablet (100 mg total) by mouth daily. 30 tablet 1   potassium chloride SA (KLOR-CON M) 20 MEQ tablet Take 1 tablet (20 mEq total) by mouth daily. Monday, Wednesday and Friday. 30 tablet 1   pravastatin (PRAVACHOL) 40 MG tablet Take 1 tablet (40 mg total) by mouth at bedtime. 90 tablet 3   spironolactone (ALDACTONE) 25 MG tablet Take 1 tablet (25 mg total) by mouth daily. 30 tablet 1   vitamin B-12 (CYANOCOBALAMIN) 100 MCG tablet Take 100 mcg by mouth 3 (three)  times a week.     zolpidem (AMBIEN) 10 MG tablet Take 10 mg by mouth at bedtime.     nitroGLYCERIN (NITROSTAT)  0.4 MG SL tablet Place 1 tablet (0.4 mg total) under the tongue every 5 (five) minutes as needed for chest pain. (Patient not taking: Reported on 08/30/2022) 25 tablet 1   Current Facility-Administered Medications  Medication Dose Route Frequency Provider Last Rate Last Admin   sodium chloride flush (NS) 0.9 % injection 3 mL  3 mL Intravenous Q12H Gollan, Kathlene November, MD          .  PHYSICAL EXAMINATION: ECOG PERFORMANCE STATUS: 1 - Symptomatic but completely ambulatory Vitals:   08/30/22 1046  BP: (!) 159/90  Pulse: (!) 58  Resp: 18  Temp: (!) 96.9 F (36.1 C)   Filed Weights   08/30/22 1046  Weight: 233 lb 14.4 oz (106.1 kg)    Physical Exam Constitutional:      General: He is not in acute distress.    Appearance: He is not diaphoretic.  HENT:     Head: Normocephalic.     Nose: Nose normal.     Mouth/Throat:     Pharynx: No oropharyngeal exudate.  Eyes:     General: No scleral icterus.    Pupils: Pupils are equal, round, and reactive to light.  Cardiovascular:     Rate and Rhythm: Normal rate and regular rhythm.     Heart sounds: No murmur heard. Pulmonary:     Effort: Pulmonary effort is normal. No respiratory distress.     Breath sounds: No wheezing.  Abdominal:     General: There is no distension.     Palpations: Abdomen is soft.     Tenderness: There is no abdominal tenderness.  Musculoskeletal:        General: Normal range of motion.     Cervical back: Normal range of motion.  Lymphadenopathy:     Cervical: No cervical adenopathy.  Skin:    General: Skin is warm and dry.     Findings: No erythema.  Neurological:     Mental Status: He is alert and oriented to person, place, and time. Mental status is at baseline.     Cranial Nerves: No cranial nerve deficit.     Motor: No abnormal muscle tone.  Psychiatric:        Mood and Affect: Affect  normal.     LABORATORY DATA:  I have reviewed the data as listed    Latest Ref Rng & Units 08/30/2022   10:30 AM 03/31/2022   12:42 PM 02/25/2022   10:50 AM  CBC  WBC 4.0 - 10.5 K/uL 5.9  5.0  6.2   Hemoglobin 13.0 - 17.0 g/dL 12.5  13.1  13.7   Hematocrit 39.0 - 52.0 % 36.4  39.6  40.5   Platelets 150 - 400 K/uL 163  181  156        Latest Ref Rng & Units 08/30/2022   10:30 AM 03/31/2022   12:42 PM 03/22/2022   12:36 PM  CMP  Glucose 70 - 99 mg/dL 103  103  118   BUN 8 - 23 mg/dL '20  14  31   '$ Creatinine 0.61 - 1.24 mg/dL 1.03  1.05  1.25   Sodium 135 - 145 mmol/L 139  141  136   Potassium 3.5 - 5.1 mmol/L 3.9  4.2  3.0   Chloride 98 - 111 mmol/L 107  108  103   CO2 22 - 32 mmol/L '26  26  26   '$ Calcium 8.9 - 10.3 mg/dL 8.8  9.5  9.0   Total Protein 6.5 -  8.1 g/dL 6.9     Total Bilirubin 0.3 - 1.2 mg/dL 1.0     Alkaline Phos 38 - 126 U/L 73     AST 15 - 41 U/L 15     ALT 0 - 44 U/L 10      RADIOGRAPHIC STUDIES: I have personally reviewed the radiological images as listed and agreed with the findings in the report. No results found.

## 2022-08-30 NOTE — Progress Notes (Signed)
Pt here for follow up. Pt limping an out of balance, wheelchair offered multiple times and pt declined.

## 2022-08-30 NOTE — Assessment & Plan Note (Signed)
Clinically patient has been doing well.  5 years after he finishes treatments. He has no constitutional symptoms. Labs reviewed and discussed with patient. Patient will continue follow-up with oncology annually for clinic surveillance.

## 2022-08-30 NOTE — Assessment & Plan Note (Signed)
Patient takes gabapentin as needed

## 2022-08-30 NOTE — Assessment & Plan Note (Signed)
Alcohol cessation recommendation was discussed with patient.

## 2022-08-30 NOTE — Assessment & Plan Note (Addendum)
I agree with patient's primary care provider that he will need B12 supplementation.  Patient plans to start oral B12. B12 deficiency may further exacerbate his chronic gait problem.

## 2022-09-20 NOTE — Progress Notes (Unsigned)
Cardiology Office Note  Date:  09/21/2022   ID:  John Gallegos, John Gallegos 06/28/1946, MRN [id number]  PCP:  Tracie Harrier, MD   Chief Complaint  Patient presents with   3-4 month follow up     "Doing well." Medications reviewed by the patient verbally.     HPI:  77 year old male with past medical history of Smoking, quit in 05/2017  HTN diffuse large B cell lymphoma (malaise summer 2018, blood in urine) CT scan 04/20/2017 which revealed moderate left hydronephrosis and delayed excretion down to a large 4.4 cm mass in or around the left proximal ureter. Significant native three-vessel coronary disease on CT scan Admitted 11/2021 with hypertensive urgency and acute diastolic heart failure. Treated with IV lasix. Troponin was elevated.  Echo showed preserved EF.  BB was held for bradcyardia.  Presents for follow-up of his chest pain, angina, follow-up of his cardiac CTA, HTN  LOV August 2023 Seen by one of our providers March 22, 2022 Cardiac CTA as detailed below March 25, 2022 Seen by one of our providers October 2023  Cardiac cath 8/23 Heavily calcified coronary arteries with moderate three-vessel coronary artery disease. Normal LV systolic function and moderately elevated left ventricular end-diastolic pressure 24 mmHg. The patient has diffuse heavily calcified disease but there is no critical stenosis that requires revascularization.  Bp elevated today 160/100 even on repeat, reports taking his medications this morning Does not check blood pressure at home, does not have a blood pressure cuff Blood pressure also elevated August 30, 2022 on visit with oncology We went through each one of his medications and he reports compliance to Cardura 2 twice daily, hydralazine 100 twice daily, losartan 100 daily, spironolactone 25 daily  Reports having back pain, prior hx of injections  Other past medical history reviewed Cardiac CTA was ordered, performed last week  03/25/22 Very high coronary calcium score of 5071. This was 97th percentile for age and sex matched control. 2. Normal coronary origin with right dominance. 3. Heavily calcified 3 vessel coronary arteries. 4. Severe stenosis (>70%) in the proximal RCA and LAD. 5. Moderate stenosis (50-69%) in the proximal LCx. 6. CAD-RADS 4 Severe stenosis. (70-99% or > 50% left main).   Labs reviewed Total chol 145 up to 203, off statin A1C 5.9  Gastroenteritis-secondary to COVID-19 infection. 09/2020 Dehydrated, CR 1.77 Weakness Hypokalemia Treated with steroids Losartan HCTZ was held at d/c  History of lymphoma, has finished treatment Has neuropathy  Echo 06/07/2017 Normal EF 60%  previous imaging studies reviewed with him in detail  PET scan 05/18/2017  IMPRESSION: 1. 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  PSH:    Past Surgical History:  Procedure Laterality Date   CYSTOSCOPY W/ RETROGRADES Left 08/19/2017   Procedure: CYSTOSCOPY WITH RETROGRADE PYELOGRAM;  Surgeon: Abbie Sons, MD;  Location: [facility];  Service: Urology;  Laterality: Left;   CYSTOSCOPY W/ URETERAL STENT REMOVAL Left 08/19/2017   Procedure: CYSTOSCOPY WITH STENT REMOVAL;  Surgeon: Abbie Sons, MD;  Location: [facility];  Service: Urology;  Laterality: Left;   CYSTOSCOPY WITH BIOPSY Left 05/13/2017   Procedure: CYSTOSCOPY WITH URETERAL BIOPSY;  Surgeon: Pilar Jarvis,  Horald Pollen, MD;  Location: [facility];  Service: Urology;  Laterality: Left;   CYSTOSCOPY WITH STENT PLACEMENT Left 05/13/2017    Procedure: CYSTOSCOPY WITH STENT PLACEMENT;  Surgeon: Nickie Retort, MD;  Location: [facility];  Service: Urology;  Laterality: Left;   LEFT HEART CATH AND CORONARY ANGIOGRAPHY N/A 04/07/2022   Procedure: LEFT HEART CATH AND CORONARY ANGIOGRAPHY;  Surgeon: Wellington Hampshire, MD;  Location: [place] [facility];  Service: Cardiovascular;  Laterality: N/A;   PORTA CATH INSERTION N/A 06/14/2017   Procedure: PORTA CATH INSERTION;  Surgeon: Algernon Huxley, MD;  Location: [place] [facility];  Service: Cardiovascular;  Laterality: N/A;   PORTA CATH REMOVAL N/A 02/25/2020   Procedure: PORTA CATH REMOVAL;  Surgeon: Algernon Huxley, MD;  Location: [place] [facility];  Service: Cardiovascular;  Laterality: N/A;   pyleostenosis     73 weeks old   right knne surgery     needle went through knee   TONSILLECTOMY     URETEROSCOPY Left 05/13/2017   Procedure: URETEROSCOPY;  Surgeon: Nickie Retort, MD;  Location: [facility];  Service: Urology;  Laterality: Left;   XI ROBOTIC ASSISTED VENTRAL HERNIA N/A 05/18/2019   Procedure: XI ROBOTIC ASSISTED UMBILICAL HERNIA;  Surgeon: Benjamine Sprague, DO;  Location: [facility];  Service: General;  Laterality: N/A;    Current Outpatient Medications  Medication Sig Dispense Refill   aspirin EC 81 MG tablet Take 81 mg by mouth daily.     cholecalciferol (VITAMIN D3) 25 MCG (1000 UNIT) tablet Take 1,000 Units by mouth 3 (three) times a week.     doxazosin (CARDURA) 2 MG tablet TAKE 1 TABLET(2 MG) BY MOUTH TWICE DAILY 180 tablet 0   furosemide (LASIX) 20 MG tablet Take 20 mg by mouth 3 (three) times a week.     gabapentin (NEURONTIN) 300 MG capsule TAKE 1 CAPSULE(300 MG) BY MOUTH AT BEDTIME 90 capsule 1   hydrALAZINE (APRESOLINE) 100 MG tablet Take 100 mg by mouth 2 (two) times daily.     losartan (COZAAR) 100 MG tablet Take 1 tablet (100 mg total) by mouth daily. 30 tablet 1   meloxicam (MOBIC) 7.5 MG tablet Take 7.5 mg by mouth daily as needed.     potassium chloride SA  (KLOR-CON M) 20 MEQ tablet Take 1 tablet (20 mEq total) by mouth daily. Monday, Wednesday and Friday. 30 tablet 1   pravastatin (PRAVACHOL) 40 MG tablet Take 1 tablet (40 mg total) by mouth at bedtime. 90 tablet 3   spironolactone (ALDACTONE) 25 MG tablet Take 1 tablet (25 mg total) by mouth daily. 30 tablet 1   vitamin B-12 (CYANOCOBALAMIN) 100 MCG tablet Take 100 mcg by mouth 3 (three) times a week.     zolpidem (AMBIEN) 10 MG tablet Take 10 mg by mouth at bedtime.     nitroGLYCERIN (NITROSTAT) 0.4 MG SL tablet Place 1 tablet (0.4 mg total) under the tongue every 5 (five) minutes as needed for chest pain. (Patient not taking: Reported on 09/21/2022) 25 tablet 1   Current Facility-Administered Medications  Medication Dose Route Frequency Provider Last Rate Last Admin   sodium chloride flush (NS) 0.9 % injection 3 mL  3 mL Intravenous Q12H Cordale Manera, Kathlene November, MD        Allergies:   Amlodipine   Social History:  The patient  reports that he quit smoking about 5 years ago. His smoking use included cigarettes. He has a 25.00 pack-year smoking history. He has  never used smokeless tobacco. He reports current alcohol use of about 2.0 standard drinks of alcohol per week. He reports that he does not use drugs.   Family History:   family history includes Aneurysm in his mother; Heart attack in his mother; Lung cancer in his father; Lymphoma in his sister; Pancreatic cancer in his mother.    Review of Systems: Review of Systems  Constitutional: Negative.   HENT: Negative.    Respiratory: Negative.    Cardiovascular: Negative.   Gastrointestinal: Negative.   Musculoskeletal:  Positive for joint pain.       Nerve pain  Neurological: Negative.   Psychiatric/Behavioral: Negative.    All other systems reviewed and are negative.   PHYSICAL EXAM: VS:  BP (!) 162/100 (BP Location: Left Arm, Patient Position: Sitting)   Pulse (!) 59   Ht 6' 1"$  (1.854 m)   Wt 234 lb 8 oz (106.4 kg)   SpO2 96%   BMI  30.94 kg/m  , BMI Body mass index is 30.94 kg/m. Constitutional:  oriented to person, place, and time. No distress.  HENT:  Head: Grossly normal Eyes:  no discharge. No scleral icterus.  Neck: No JVD, no carotid bruits  Cardiovascular: Regular rate and rhythm, no murmurs appreciated Pulmonary/Chest: Clear to auscultation bilaterally, no wheezes or rails Abdominal: Soft.  no distension.  no tenderness.  Musculoskeletal: Normal range of motion Neurological:  normal muscle tone. Coordination normal. No atrophy Skin: Skin warm and dry Psychiatric: normal affect, pleasant  Recent Labs: 11/30/2021: B Natriuretic Peptide 873.1 12/01/2021: Magnesium 2.1 08/30/2022: ALT 10; BUN 20; Creatinine, Ser 1.03; Hemoglobin 12.5; Platelets 163; Potassium 3.9; Sodium 139    Lipid Panel No results found for: "CHOL", "HDL", "LDLCALC", "TRIG"    Wt Readings from Last 3 Encounters:  09/21/22 234 lb 8 oz (106.4 kg)  08/30/22 233 lb 14.4 oz (106.1 kg)  05/17/22 229 lb (103.9 kg)     ASSESSMENT AND PLAN:  Coronary artery disease with stable angina Severe three-vessel coronary disease on cardiac CTA most notably in the proximal RCA and LAD Cardiac catheterization with moderate three-vessel coronary disease, no intervention performed -Recommend he add Zetia to his pravastatin to achieve goal LDL less than 60 Denies anginal symptoms  Diffuse large B-cell lymphoma of extranodal site excluding spleen and other solid organs (HCC) Completed RCHOP Followed by oncology, stable  Mixed hyperlipidemia Continue pravastatin, add Zetia 10 mg daily as LDL above goal  Essential hypertension Blood pressure running high on visit with oncology last month and again today Not checking his blood pressure at home Recommend he increase Cardura up to 4 mg twice daily, continue hydralazine, losartan, spironolactone Recommend close monitoring of blood pressure at home  Chest pain with moderate risk for cardiac  etiology Denies chest pain on today's visit, recent cardiac catheterization with moderate diffuse disease  Smoker Stopped smoking >3 years  Copd Long smoking history, feels breathing is stable Recommend regular walking program   Total encounter time more than 30 minutes  Greater than 50% was spent in counseling and coordination of care with the patient   No orders of the defined types were placed in this encounter.    Signed, Esmond Plants, M.D., Ph.D. 09/21/2022  [place], [place]

## 2022-09-21 ENCOUNTER — Ambulatory Visit: Payer: Medicare HMO | Attending: Cardiovascular Disease | Admitting: Cardiovascular Disease

## 2022-09-21 ENCOUNTER — Encounter: Payer: Self-pay | Admitting: Cardiovascular Disease

## 2022-09-21 VITALS — BP 162/100 | HR 59 | Ht 73.0 in | Wt 234.5 lb

## 2022-09-21 DIAGNOSIS — I25118 Atherosclerotic heart disease of native coronary artery with other forms of angina pectoris: Secondary | ICD-10-CM | POA: Diagnosis not present

## 2022-09-21 DIAGNOSIS — C8300 Small cell B-cell lymphoma, unspecified site: Secondary | ICD-10-CM

## 2022-09-21 DIAGNOSIS — M1990 Unspecified osteoarthritis, unspecified site: Secondary | ICD-10-CM | POA: Diagnosis not present

## 2022-09-21 DIAGNOSIS — I10 Essential (primary) hypertension: Secondary | ICD-10-CM

## 2022-09-21 DIAGNOSIS — I509 Heart failure, unspecified: Secondary | ICD-10-CM | POA: Diagnosis not present

## 2022-09-21 DIAGNOSIS — I5032 Chronic diastolic (congestive) heart failure: Secondary | ICD-10-CM

## 2022-09-21 DIAGNOSIS — Z87891 Personal history of nicotine dependence: Secondary | ICD-10-CM | POA: Diagnosis not present

## 2022-09-21 DIAGNOSIS — R001 Bradycardia, unspecified: Secondary | ICD-10-CM | POA: Diagnosis not present

## 2022-09-21 DIAGNOSIS — I700 Atherosclerosis of aorta: Secondary | ICD-10-CM | POA: Diagnosis not present

## 2022-09-21 DIAGNOSIS — E782 Mixed hyperlipidemia: Secondary | ICD-10-CM

## 2022-09-21 DIAGNOSIS — J449 Chronic obstructive pulmonary disease, unspecified: Secondary | ICD-10-CM | POA: Diagnosis not present

## 2022-09-21 DIAGNOSIS — C8518 Unspecified B-cell lymphoma, lymph nodes of multiple sites: Secondary | ICD-10-CM | POA: Diagnosis not present

## 2022-09-21 DIAGNOSIS — I2510 Atherosclerotic heart disease of native coronary artery without angina pectoris: Secondary | ICD-10-CM | POA: Diagnosis not present

## 2022-09-21 MED ORDER — EZETIMIBE 10 MG PO TABS: 10.0000 mg | ORAL_TABLET | Freq: Every day | ORAL | 3 refills | Status: DC

## 2022-09-21 MED ORDER — DOXAZOSIN MESYLATE 4 MG PO TABS: 4.0000 mg | ORAL_TABLET | Freq: Every day | ORAL | 3 refills | Status: DC

## 2022-09-21 MED ORDER — DOXAZOSIN MESYLATE 4 MG PO TABS: 4.0000 mg | ORAL_TABLET | Freq: Two times a day (BID) | ORAL | 3 refills | Status: DC

## 2022-09-21 NOTE — Patient Instructions (Addendum)
Monitor blood pressure at home, call the office with numbers   Medication Instructions:  Please increase the cardura up to 4 mg twice a day for blood pressure   Please start zetia 10 mg daily for cholesterol   If you need a refill on your cardiac medications before your next appointment, please call your pharmacy.   Lab work: No new labs needed  Testing/Procedures: No new testing needed  Follow-Up: At [facility], you and your health needs are our priority.  As part of our continuing mission to provide you with exceptional heart care, we have created designated Provider Care Teams.  These Care Teams include your primary Cardiologist (physician) and Advanced Practice Providers (APPs -  Physician Assistants and Nurse Practitioners) who all work together to provide you with the care you need, when you need it.  You will need a follow up appointment in 6 months, APP ok  Providers on your designated Care Team:   Murray Hodgkins, NP Christell Faith, PA-C Cadence Kathlen Mody, [place]  COVID-19 Vaccine Information can be found at: [email or web address].uk For questions related to vaccine distribution or appointments, please email [email or web address] or call [phone].

## 2022-10-04 ENCOUNTER — Other Ambulatory Visit: Payer: Self-pay | Admitting: Medical Oncology

## 2022-10-04 MED ORDER — GABAPENTIN 300 MG PO CAPS: ORAL_CAPSULE | ORAL | 1 refills | Status: DC

## 2022-10-14 ENCOUNTER — Ambulatory Visit: Payer: Self-pay | Admitting: *Deleted

## 2022-10-14 NOTE — Patient Outreach (Signed)
  Care Coordination   Follow Up Visit Note   10/14/2022 Name: KORRY KITTS MRN: [id number] DOB: May 31, 1946  [facility] Mounger is a 77 y.o. year old male who sees Tracie Harrier, MD for primary care. I spoke with  [facility] Pargas by phone today.  What matters to the patients health and wellness today?  Low back pain management.  Medication "takes the edge off" but usually does not decrease less than 6/10.  Active with CCM at [facility].  Denies any urgent concerns, encouraged to contact this care manager with questions.      Goals Addressed             This Visit's Progress    COMPLETED: RNCM: Effective Management of pain       Care Coordination Interventions:  The patient rates his neuropathy pain 7/10 today.  State it is always there but manageable.    Reviewed provider established plan for pain management. The patient does take gabapentin at bedtime and also Etodolac 400 mg as needed.  Denies any acute changes in pain.  Discussed importance of adherence to all scheduled medical appointments: 08-18-2022 at 1030 am with pcp and February with cardiology Counseled on the importance of reporting any/all new or changed pain symptoms or management strategies to pain management provider Advised patient to report to care team affect of pain on daily activities.  Discussed use of relaxation techniques and/or diversional activities to assist with pain reduction (distraction, imagery, relaxation, massage, acupressure, TENS, heat, and cold application. Discussed the use of compression socks to help with circulation and pain in legs. State it is hard to get compression socks on but he has been using ace wraps for compression.  This has helped some with pain as well as some right leg swelling.  He will discuss swelling with PCP Reviewed with patient prescribed pharmacological and nonpharmacological pain relief strategies. Patient has tried several different things.  Advised patient to  discuss unresolved pain, changes in level or intensity of pain, other pain relief options available  with provider  Update 3/7 - Active with [facility] team.  Has been following up with ortho, will have injections.            SDOH assessments and interventions completed:  No     Care Coordination Interventions:  Yes, provided   Follow up plan: No further intervention required.   Encounter Outcome:  Pt. Visit Completed   Valente David, RN, MSN, [facility] Care Management Care Management Coordinator [phone]

## 2022-10-14 NOTE — Patient Outreach (Signed)
  Care Coordination   10/14/2022 Name: John Gallegos MRN: [id number] DOB: [birth year]   Care Coordination Outreach Attempts:  An unsuccessful telephone outreach was attempted for a scheduled appointment today.  Follow Up Plan:  No further outreach attempts will be made at this time. We have been unable to contact the patient to offer or enroll patient in care coordination services  Patient active with [facility] CCM team.  Encounter Outcome:  No Answer   Care Coordination Interventions:  No, not indicated    Valente David, RN, MSN, [facility] Care Management Care Management Coordinator [phone]

## 2022-12-10 DIAGNOSIS — I10 Essential (primary) hypertension: Secondary | ICD-10-CM | POA: Diagnosis not present

## 2022-12-10 DIAGNOSIS — Z0000 Encounter for general adult medical examination without abnormal findings: Secondary | ICD-10-CM | POA: Diagnosis not present

## 2022-12-10 DIAGNOSIS — M1990 Unspecified osteoarthritis, unspecified site: Secondary | ICD-10-CM | POA: Diagnosis not present

## 2022-12-10 DIAGNOSIS — G4709 Other insomnia: Secondary | ICD-10-CM | POA: Diagnosis not present

## 2022-12-10 DIAGNOSIS — I5032 Chronic diastolic (congestive) heart failure: Secondary | ICD-10-CM | POA: Diagnosis not present

## 2022-12-10 DIAGNOSIS — R7989 Other specified abnormal findings of blood chemistry: Secondary | ICD-10-CM | POA: Diagnosis not present

## 2022-12-10 DIAGNOSIS — R2689 Other abnormalities of gait and mobility: Secondary | ICD-10-CM | POA: Diagnosis not present

## 2022-12-10 DIAGNOSIS — C8339 Diffuse large B-cell lymphoma, extranodal and solid organ sites: Secondary | ICD-10-CM | POA: Diagnosis not present

## 2022-12-10 DIAGNOSIS — H918X1 Other specified hearing loss, right ear: Secondary | ICD-10-CM | POA: Diagnosis not present

## 2022-12-20 DIAGNOSIS — I110 Hypertensive heart disease with heart failure: Secondary | ICD-10-CM | POA: Diagnosis not present

## 2022-12-20 DIAGNOSIS — H9190 Unspecified hearing loss, unspecified ear: Secondary | ICD-10-CM | POA: Diagnosis not present

## 2022-12-20 DIAGNOSIS — I2510 Atherosclerotic heart disease of native coronary artery without angina pectoris: Secondary | ICD-10-CM | POA: Diagnosis not present

## 2022-12-20 DIAGNOSIS — E538 Deficiency of other specified B group vitamins: Secondary | ICD-10-CM | POA: Diagnosis not present

## 2022-12-20 DIAGNOSIS — I5030 Unspecified diastolic (congestive) heart failure: Secondary | ICD-10-CM | POA: Diagnosis not present

## 2022-12-20 DIAGNOSIS — Z87891 Personal history of nicotine dependence: Secondary | ICD-10-CM | POA: Diagnosis not present

## 2022-12-20 DIAGNOSIS — C8330 Diffuse large B-cell lymphoma, unspecified site: Secondary | ICD-10-CM | POA: Diagnosis not present

## 2022-12-23 DIAGNOSIS — I700 Atherosclerosis of aorta: Secondary | ICD-10-CM | POA: Diagnosis not present

## 2022-12-23 DIAGNOSIS — I25118 Atherosclerotic heart disease of native coronary artery with other forms of angina pectoris: Secondary | ICD-10-CM | POA: Diagnosis not present

## 2022-12-23 DIAGNOSIS — I10 Essential (primary) hypertension: Secondary | ICD-10-CM | POA: Diagnosis not present

## 2022-12-23 DIAGNOSIS — I5032 Chronic diastolic (congestive) heart failure: Secondary | ICD-10-CM | POA: Diagnosis not present

## 2023-03-22 ENCOUNTER — Other Ambulatory Visit: Payer: Self-pay | Admitting: Cardiovascular Disease

## 2023-03-22 NOTE — Telephone Encounter (Signed)
Good Morning,  Could you please schedule this patient a 6 month follow up visit? The patient was last seen by Dr. Mariah Milling on 09-21-22. Thank you so much.

## 2023-03-24 NOTE — Telephone Encounter (Signed)
Patient is scheduled on 10/8

## 2023-04-08 ENCOUNTER — Other Ambulatory Visit: Payer: Self-pay | Admitting: Medical Oncology

## 2023-04-08 MED ORDER — GABAPENTIN 300 MG PO CAPS: ORAL_CAPSULE | ORAL | 1 refills | Status: DC

## 2023-05-16 NOTE — Progress Notes (Unsigned)
Cardiology Office Note  Date:  05/17/2023   ID:  John Gallegos, DOB Dec 06, 1945, MRN [id number]  PCP:  Barbette Reichmann, MD   Cc: CAD follow up, leg pain  HPI:  77 year old male with past medical history of Smoking, quit in 05/2017  HTN diffuse large B cell lymphoma (malaise summer 2018, blood in urine) CT scan 04/20/2017 which revealed moderate left hydronephrosis and delayed excretion down to a large 4.4 cm mass in or around the left proximal ureter. Significant native three-vessel coronary disease on CT scan Admitted 11/2021 with hypertensive urgency and acute diastolic heart failure. Treated with IV lasix. Troponin was elevated.  Echo showed preserved EF.  BB was held for bradcyardia.  Presents for follow-up of his chest pain, angina, follow-up of his cardiac CTA, HTN  LOV February 2024  Cardiac catheterization April 07, 2022  heavily calcified coronary arteries with moderate three-vessel disease Normal LV function moderately elevated left ventricular end-diastolic pressure  BP well controlled Leg pain, "neuropathy" No regular exercise program  Lives in appertment No event hobbies  Denies chest pain concerning for angina  Lab work reviewed A1c 5.4 Total cholesterol 140 LDL 70  EKG personally reviewed by myself on todays visit EKG Interpretation Date/Time:  Tuesday May 17 2023 14:30:05 EDT Ventricular Rate:  65 PR Interval:  182 QRS Duration:  80 QT Interval:  430 QTC Calculation: 447 R Axis:   28  Text Interpretation: Normal sinus rhythm Nonspecific T wave abnormality When compared with ECG of 02-Dec-2021 06:18, No significant change was found Confirmed by Julien Nordmann [phone]) on 05/17/2023 2:41:59 PM    Other past medical history reviewed Cardiac CTA was ordered, performed last week 03/25/22 Very high coronary calcium score of 5071. This was 97th percentile for age and sex matched control. 2. Normal coronary origin with right dominance. 3.  Heavily calcified 3 vessel coronary arteries. 4. Severe stenosis (>70%) in the proximal RCA and LAD. 5. Moderate stenosis (50-69%) in the proximal LCx. 6. CAD-RADS 4 Severe stenosis. (70-99% or > 50% left main).   Gastroenteritis-secondary to COVID-19 infection. 09/2020 Dehydrated, CR 1.77 Weakness Hypokalemia Treated with steroids Losartan HCTZ was held at d/c  History of lymphoma, has finished treatment Has neuropathy  Echo 06/07/2017 Normal EF 60%  previous imaging studies reviewed with him in detail  PET scan 05/18/2017  IMPRESSION: 1. 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  PSH:    Past Surgical History:  Procedure Laterality Date   CYSTOSCOPY W/ RETROGRADES Left 08/19/2017   Procedure: CYSTOSCOPY WITH RETROGRADE PYELOGRAM;  Surgeon: Riki Altes, MD;  Location: [facility];  Service: Urology;  Laterality: Left;   CYSTOSCOPY W/ URETERAL STENT REMOVAL Left 08/19/2017   Procedure: CYSTOSCOPY WITH STENT REMOVAL;  Surgeon: Riki Altes, MD;  Location: [facility];  Service: Urology;  Laterality: Left;   CYSTOSCOPY WITH BIOPSY Left 05/13/2017   Procedure: CYSTOSCOPY WITH URETERAL BIOPSY;  Surgeon: Hildred Laser, MD;  Location: [facility];  Service: Urology;  Laterality: Left;   CYSTOSCOPY WITH STENT PLACEMENT Left 05/13/2017   Procedure: CYSTOSCOPY WITH STENT PLACEMENT;  Surgeon: Hildred Laser, MD;  Location: [facility];  Service: Urology;  Laterality: Left;   LEFT HEART CATH AND CORONARY ANGIOGRAPHY N/A 04/07/2022   Procedure: LEFT HEART CATH  AND CORONARY ANGIOGRAPHY;  Surgeon: Iran Ouch, MD;  Location: [facility];  Service: Cardiovascular;  Laterality: N/A;   PORTA CATH INSERTION N/A 06/14/2017   Procedure: PORTA CATH INSERTION;  Surgeon: Annice Needy, MD;  Location: [facility];  Service: Cardiovascular;  Laterality: N/A;   PORTA CATH REMOVAL N/A 02/25/2020   Procedure: PORTA CATH REMOVAL;  Surgeon: Annice Needy, MD;  Location: [facility];  Service: Cardiovascular;  Laterality: N/A;   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     cholecalciferol (VITAMIN D3) 25 MCG (1000 UNIT) tablet Take 1,000 Units by mouth 3 (three) times a week.     doxazosin (CARDURA) 4 MG tablet Take 1 tablet (4 mg total) by mouth 2 (two) times daily. 180 tablet 3   ezetimibe (ZETIA) 10 MG tablet Take 1 tablet (10 mg total) by mouth daily. 90 tablet 3   furosemide (LASIX) 20 MG tablet Take 20 mg by mouth 3 (three) times a week.     gabapentin (NEURONTIN) 300 MG capsule Take 1 capsule (300 mg) by mouth at bedtime 90 capsule 1   hydrALAZINE (APRESOLINE) 100 MG tablet Take 100 mg by mouth 2 (two) times daily.     losartan (COZAAR) 100 MG tablet Take 1 tablet (100 mg total) by mouth daily. 30 tablet 1   meloxicam (MOBIC) 7.5 MG tablet Take 7.5 mg by mouth daily as needed.     nitroGLYCERIN (NITROSTAT) 0.4 MG SL tablet Place 1 tablet (0.4 mg total) under the tongue every 5 (five) minutes as needed for chest pain. 25  tablet 1   potassium chloride SA (KLOR-CON M) 20 MEQ tablet Take 1 tablet (20 mEq total) by mouth daily. Monday, Wednesday and Friday. 30 tablet 1   pravastatin (PRAVACHOL) 40 MG tablet Take 1 tablet (40 mg total) by mouth at bedtime. PLEASE CALL OFFICE TO SCHEDULE APPOINTMENT PRIOR TO NEXT REFILL 90 tablet 0   spironolactone (ALDACTONE) 25 MG tablet Take 1 tablet (25 mg total) by mouth daily. 30 tablet 1   vitamin B-12 (CYANOCOBALAMIN) 100 MCG tablet Take 100 mcg by mouth 3 (three) times a week.     zolpidem (AMBIEN) 10 MG tablet Take 10 mg by mouth at bedtime.     Current Facility-Administered Medications  Medication Dose Route Frequency Provider Last Rate Last Admin   sodium chloride flush (NS) 0.9 % injection 3 mL  3 mL Intravenous Q12H Joline Encalada, Tollie Pizza, MD        Allergies:   Amlodipine   Social History:  The patient  reports that he quit smoking about 6 years ago. His smoking use included cigarettes. He started smoking about 56 years ago. He has a 25 pack-year smoking history. He has never used smokeless tobacco. He reports current  alcohol use of about 2.0 standard drinks of alcohol per week. He reports that he does not use drugs.   Family History:   family history includes Aneurysm in his mother; Heart attack in his mother; Lung cancer in his father; Lymphoma in his sister; Pancreatic cancer in his mother.    Review of Systems: Review of Systems  Constitutional: Negative.   HENT: Negative.    Respiratory: Negative.    Cardiovascular: Negative.   Gastrointestinal: Negative.   Musculoskeletal:  Positive for joint pain.       Nerve pain  Neurological: Negative.   Psychiatric/Behavioral: Negative.    All other systems reviewed and are negative.   PHYSICAL EXAM: VS:  BP 128/60   Pulse 65   Ht 6\' 1"  (1.854 m)   Wt 228 lb (103.4 kg)   SpO2 96%   BMI 30.08 kg/m  , BMI Body mass index is 30.08 kg/m. Constitutional:  oriented to person, place, and time. No distress.  HENT:   Head: Grossly normal Eyes:  no discharge. No scleral icterus.  Neck: No JVD, no carotid bruits  Cardiovascular: Regular rate and rhythm, no murmurs appreciated Pulmonary/Chest: Clear to auscultation bilaterally, no wheezes or rails Abdominal: Soft.  no distension.  no tenderness.  Musculoskeletal: Normal range of motion Neurological:  normal muscle tone. Coordination normal. No atrophy Skin: Skin warm and dry Psychiatric: normal affect, pleasant  Recent Labs: 08/30/2022: ALT 10; BUN 20; Creatinine, Ser 1.03; Hemoglobin 12.5; Platelets 163; Potassium 3.9; Sodium 139    Lipid Panel No results found for: "CHOL", "HDL", "LDLCALC", "TRIG"    Wt Readings from Last 3 Encounters:  05/17/23 228 lb (103.4 kg)  09/21/22 234 lb 8 oz (106.4 kg)  08/30/22 233 lb 14.4 oz (106.1 kg)     ASSESSMENT AND PLAN:  Coronary artery disease with stable angina Severe three-vessel coronary disease on cardiac CTA most notably in the proximal RCA and LAD Cardiac catheterization with moderate three-vessel coronary disease, no intervention performed Continue pravastatin with Zetia, calorie restriction, walking program  Diffuse large B-cell lymphoma of extranodal site excluding spleen and other solid organs (HCC) Completed [facility] Followed by oncology, stable  Mixed hyperlipidemia Continue pravastatin, Zetia 10 mg daily  Goal LDL less than 70  Essential hypertension Blood pressure is well controlled on today's visit. No changes made to the medications.  Smoker Stopped smoking >3 years  Copd Long smoking history, breathing stable Recommended walking program  Chronic leg pain Neuropathy, on gabapentin nightly Previously seen by Dr. Wyn Quaker Symptoms stable Recommended graduated exercise program   Total encounter time more than 30 minutes  Greater than 50% was spent in counseling and coordination of care with the patient   Orders Placed This Encounter  Procedures   EKG 12-Lead      Signed, Dossie Arbour, M.D., Ph.D. 05/17/2023  [facility] [place], [place] [phone]

## 2023-05-17 ENCOUNTER — Encounter: Payer: Self-pay | Admitting: Cardiovascular Disease

## 2023-05-17 ENCOUNTER — Ambulatory Visit: Payer: Medicare HMO | Attending: Cardiovascular Disease | Admitting: Cardiovascular Disease

## 2023-05-17 VITALS — BP 128/60 | HR 65 | Ht 73.0 in | Wt 228.0 lb

## 2023-05-17 DIAGNOSIS — I10 Essential (primary) hypertension: Secondary | ICD-10-CM

## 2023-05-17 DIAGNOSIS — C8300 Small cell B-cell lymphoma, unspecified site: Secondary | ICD-10-CM | POA: Diagnosis not present

## 2023-05-17 DIAGNOSIS — C8518 Unspecified B-cell lymphoma, lymph nodes of multiple sites: Secondary | ICD-10-CM | POA: Diagnosis not present

## 2023-05-17 DIAGNOSIS — Z87891 Personal history of nicotine dependence: Secondary | ICD-10-CM | POA: Diagnosis not present

## 2023-05-17 DIAGNOSIS — E782 Mixed hyperlipidemia: Secondary | ICD-10-CM

## 2023-05-17 DIAGNOSIS — I5032 Chronic diastolic (congestive) heart failure: Secondary | ICD-10-CM | POA: Diagnosis not present

## 2023-05-17 DIAGNOSIS — R001 Bradycardia, unspecified: Secondary | ICD-10-CM

## 2023-05-17 DIAGNOSIS — I25118 Atherosclerotic heart disease of native coronary artery with other forms of angina pectoris: Secondary | ICD-10-CM

## 2023-05-17 MED ORDER — PRAVASTATIN SODIUM 40 MG PO TABS: 40.0000 mg | ORAL_TABLET | Freq: Every day | ORAL | 3 refills | Status: DC

## 2023-05-17 MED ORDER — DOXAZOSIN MESYLATE 4 MG PO TABS: 4.0000 mg | ORAL_TABLET | Freq: Two times a day (BID) | ORAL | 3 refills | Status: DC

## 2023-05-17 MED ORDER — EZETIMIBE 10 MG PO TABS: 10.0000 mg | ORAL_TABLET | Freq: Every day | ORAL | 3 refills | Status: DC

## 2023-05-17 NOTE — Patient Instructions (Signed)

## 2023-06-09 DIAGNOSIS — I2510 Atherosclerotic heart disease of native coronary artery without angina pectoris: Secondary | ICD-10-CM | POA: Diagnosis not present

## 2023-06-09 DIAGNOSIS — H9193 Unspecified hearing loss, bilateral: Secondary | ICD-10-CM | POA: Diagnosis not present

## 2023-06-09 DIAGNOSIS — Z125 Encounter for screening for malignant neoplasm of prostate: Secondary | ICD-10-CM | POA: Diagnosis not present

## 2023-06-09 DIAGNOSIS — I5032 Chronic diastolic (congestive) heart failure: Secondary | ICD-10-CM | POA: Diagnosis not present

## 2023-06-09 DIAGNOSIS — E538 Deficiency of other specified B group vitamins: Secondary | ICD-10-CM | POA: Diagnosis not present

## 2023-06-09 DIAGNOSIS — C83398 Diffuse large b-cell lymphoma of other extranodal and solid organ sites: Secondary | ICD-10-CM | POA: Diagnosis not present

## 2023-06-09 DIAGNOSIS — I10 Essential (primary) hypertension: Secondary | ICD-10-CM | POA: Diagnosis not present

## 2023-06-13 DIAGNOSIS — E538 Deficiency of other specified B group vitamins: Secondary | ICD-10-CM | POA: Diagnosis not present

## 2023-06-13 DIAGNOSIS — R2689 Other abnormalities of gait and mobility: Secondary | ICD-10-CM | POA: Diagnosis not present

## 2023-06-13 DIAGNOSIS — I5030 Unspecified diastolic (congestive) heart failure: Secondary | ICD-10-CM | POA: Diagnosis not present

## 2023-06-13 DIAGNOSIS — I110 Hypertensive heart disease with heart failure: Secondary | ICD-10-CM | POA: Diagnosis not present

## 2023-06-13 DIAGNOSIS — Z23 Encounter for immunization: Secondary | ICD-10-CM | POA: Diagnosis not present

## 2023-06-13 DIAGNOSIS — C8330 Diffuse large B-cell lymphoma, unspecified site: Secondary | ICD-10-CM | POA: Diagnosis not present

## 2023-06-13 DIAGNOSIS — Z87891 Personal history of nicotine dependence: Secondary | ICD-10-CM | POA: Diagnosis not present

## 2023-06-21 ENCOUNTER — Other Ambulatory Visit: Payer: Self-pay | Admitting: Cardiovascular Disease

## 2023-07-12 DIAGNOSIS — J069 Acute upper respiratory infection, unspecified: Secondary | ICD-10-CM | POA: Diagnosis not present

## 2023-08-31 ENCOUNTER — Inpatient Hospital Stay: Payer: Medicare HMO | Admitting: Oncology

## 2023-08-31 ENCOUNTER — Inpatient Hospital Stay: Payer: Medicare HMO

## 2023-09-13 ENCOUNTER — Other Ambulatory Visit: Payer: Self-pay | Admitting: *Deleted

## 2023-09-13 DIAGNOSIS — C83398 Diffuse large b-cell lymphoma of other extranodal and solid organ sites: Secondary | ICD-10-CM

## 2023-09-14 ENCOUNTER — Encounter: Payer: Self-pay | Admitting: Oncology

## 2023-09-14 ENCOUNTER — Inpatient Hospital Stay: Payer: Medicare HMO | Attending: Oncology

## 2023-09-14 ENCOUNTER — Inpatient Hospital Stay (HOSPITAL_BASED_OUTPATIENT_CLINIC_OR_DEPARTMENT_OTHER): Payer: Medicare HMO | Admitting: Oncology

## 2023-09-14 VITALS — BP 138/69 | HR 69 | Temp 96.7°F | Resp 18 | Wt 229.1 lb

## 2023-09-14 DIAGNOSIS — R269 Unspecified abnormalities of gait and mobility: Secondary | ICD-10-CM | POA: Insufficient documentation

## 2023-09-14 DIAGNOSIS — Z08 Encounter for follow-up examination after completed treatment for malignant neoplasm: Secondary | ICD-10-CM | POA: Diagnosis not present

## 2023-09-14 DIAGNOSIS — Z87891 Personal history of nicotine dependence: Secondary | ICD-10-CM | POA: Insufficient documentation

## 2023-09-14 DIAGNOSIS — G629 Polyneuropathy, unspecified: Secondary | ICD-10-CM

## 2023-09-14 DIAGNOSIS — Z8572 Personal history of non-Hodgkin lymphomas: Secondary | ICD-10-CM | POA: Insufficient documentation

## 2023-09-14 DIAGNOSIS — F1090 Alcohol use, unspecified, uncomplicated: Secondary | ICD-10-CM | POA: Diagnosis not present

## 2023-09-14 DIAGNOSIS — E538 Deficiency of other specified B group vitamins: Secondary | ICD-10-CM | POA: Insufficient documentation

## 2023-09-14 DIAGNOSIS — Z789 Other specified health status: Secondary | ICD-10-CM | POA: Diagnosis not present

## 2023-09-14 DIAGNOSIS — Z9221 Personal history of antineoplastic chemotherapy: Secondary | ICD-10-CM | POA: Diagnosis not present

## 2023-09-14 DIAGNOSIS — C83398 Diffuse large b-cell lymphoma of other extranodal and solid organ sites: Secondary | ICD-10-CM

## 2023-09-14 LAB — CBC WITH DIFFERENTIAL/PLATELET
Abs Immature Granulocytes: 0.03 10*3/uL (ref 0.00–0.07)
Basophils Absolute: 0 10*3/uL (ref 0.0–0.1)
Basophils Relative: 1 %
Eosinophils Absolute: 0.1 10*3/uL (ref 0.0–0.5)
Eosinophils Relative: 2 %
HCT: 38.1 % — ABNORMAL LOW (ref 39.0–52.0)
Hemoglobin: 13.1 g/dL (ref 13.0–17.0)
Immature Granulocytes: 1 %
Lymphocytes Relative: 11 %
Lymphs Abs: 0.7 10*3/uL (ref 0.7–4.0)
MCH: 30.6 pg (ref 26.0–34.0)
MCHC: 34.4 g/dL (ref 30.0–36.0)
MCV: 89 fL (ref 80.0–100.0)
Monocytes Absolute: 0.5 10*3/uL (ref 0.1–1.0)
Monocytes Relative: 7 %
Neutro Abs: 4.9 10*3/uL (ref 1.7–7.7)
Neutrophils Relative %: 78 %
Platelets: 184 10*3/uL (ref 150–400)
RBC: 4.28 MIL/uL (ref 4.22–5.81)
RDW: 12.5 % (ref 11.5–15.5)
WBC: 6.2 10*3/uL (ref 4.0–10.5)
nRBC: 0 % (ref 0.0–0.2)

## 2023-09-14 LAB — CMP (CANCER CENTER ONLY)
ALT: 18 U/L (ref 0–44)
AST: 19 U/L (ref 15–41)
Albumin: 3.6 g/dL (ref 3.5–5.0)
Alkaline Phosphatase: 81 U/L (ref 38–126)
Anion gap: 8 (ref 5–15)
BUN: 19 mg/dL (ref 8–23)
CO2: 25 mmol/L (ref 22–32)
Calcium: 9 mg/dL (ref 8.9–10.3)
Chloride: 105 mmol/L (ref 98–111)
Creatinine: 1.02 mg/dL (ref 0.61–1.24)
GFR, Estimated: >60 mL/min (ref >60)
Glucose, Bld: 104 mg/dL — ABNORMAL HIGH (ref 70–99)
Potassium: 3.7 mmol/L (ref 3.5–5.1)
Sodium: 138 mmol/L (ref 135–145)
Total Bilirubin: 0.7 mg/dL (ref 0.0–1.2)
Total Protein: 7.1 g/dL (ref 6.5–8.1)

## 2023-09-14 LAB — LACTATE DEHYDROGENASE: LDH: 127 U/L (ref 98–192)

## 2023-09-14 NOTE — Progress Notes (Signed)
 Hematology/Oncology follow-up note [phone] [phone]  REASON FOR VISIT Follow up for surveillance for diffuse large B-cell lymphoma.  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 He is doing well clinically. 6 years after he finishes treatments. Patient prefers to continue follow-up with oncology annually for clinic surveillance.  Vitamin B12 deficiency Recommend patient to continue take  B12 supplementation.  B12 deficiency may further exacerbate his chronic gait problem.   Alcohol  use Alcohol  cessation recommendation was discussed with patient.  Peripheral neuropathy Patient takes gabapentin  as needed   Orders Placed This Encounter  Procedures   CBC with Differential (Cancer Center Only)    Standing Status:   Future    Expected Date:   09/13/2024    Expiration Date:   09/13/2024   CMP (Cancer Center only)    Standing Status:   Future    Expected Date:   09/13/2024    Expiration Date:   09/13/2024   Lactate dehydrogenase    Standing Status:   Future    Expected Date:   09/13/2024    Expiration Date:   09/13/2024   Urinalysis, Complete w Microscopic    Standing Status:   Future    Expected Date:   09/13/2024    Expiration Date:   09/13/2024   Follow up in 1 year All questions were answered. The patient knows to call the clinic with any problems, questions or concerns.  [name], MD, PhD [facility] Hematology Oncology 09/14/2023    HISTORY OF PRESENTING ILLNESS:  John Gallegos is a  78 y.o.  male with PMH listed below presented for follow-up for treatment of diffuse large B cell lymphoma.  Pertinent history of oncology workup includes: Patient was found to have microscopic hematuria on 02/23/2017 with 4-10 RBC's/hpf.    Urine culture was negative.having symptoms of nocturia  He is smoker. 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 He was in the [employer] and reports to be exposed to Agent Orange in [place]. CT scan 04/20/2017 which revealed moderate left hydronephrosis and delayed excretion down to a large 4.4 cm mass in or around the left proximal ureter. There was also hazy soft tissue in the fat surrounding the celiac axis and superior mesenteric artery. Thickening of the right diaphragmatic crus. High gastrohepatic ligament lymphadenopathy measures up to 11 mm. Peripancreatic and root of small bowel mesentery adenopathy measures up to 2.6 cm.  He was seen by urologist Dr. Chauncey and he underwent cystoscopy, left retrograde pyelogram, left ureteroscopy and placement of left ureteral stent placement on 05/13/2017. Finding during the procedure that he has extrinsic compression of the ureter, possibly from lymphoma.  # Pathology:  Biopsy of paraspinal musculature at L2. Pathology results reviewed large B cell lymphoma. It is CD20 positive, CD10 positive. Flow cytometry shows a small clonal population of large B cells positive for CD10 and absent cop and lambda light chains. The abnormal cells represent 2% of the total cells.The morphology, initial IHC panel, and flow cytometry supports classification as B-cell lymphoma, CD10 positive, with large cell features.diffuse large B-cell lymphoma, germinal center B-cell type. Discussed with pathologist Dr.Onley, Ki67 50%, MUM1 negative, MYC negative, Cycline D1 negative, CD30 negative, FISH is positive for BCL2 gene rearrangement. Negative for MYC and BCL6. 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   # Bone marrow Biopsy: negative for  lymphoma involvement.  # PET scan 05/18/2017  IMPRESSION: 1. Multifocal metastatic disease within the LEFT retroperitoneum, central mesenteries, and RIGHT paraspinal musculature. 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 Recommend percutaneous biopsy of the RIGHT paraspinal musculature at L2. 2. Two foci of skeletal  metastasis (RIGHT distal clavicle and LEFT femur). # Interim PET scan 07/28/2017 after 2 cycles of RCHOP 1. Marked improvement, with row solution of the prior bony lesions and significant reduction in activity of the central mesenteric and right gastric adenopathy. Central mesenteric nodal is currently Deauville 3, previously Deauville 4 and Deauville 5. 2. Improvement in the right paraspinal muscular activity, currently Deauville 3, previously Deauville 4. I also discussed with radiologist that there has been further improvement in the left periureteral density, likely reflecting treatment response.   # Prognostic Model for the risk of CNS lymphoma involvement per NCCN guideline,  Patient is intermediate risk with score of 2, one from being >60, and one from more than extranodal involvement>1 sites (bone, paraspinal muscle involvement). CNS prophylaxis is usually recommended if score 4-6.  CNS prophylaxis was not offered.    #08/04/2018 CT abdomen pelvis showed no evidence of acute abnormality.  Stable stranding haziness at the root of mesentery and retroperitoneum.  No new or enlarged lymph nodes identified.  Small to moderate umbilical hernia containing fat, moderate right lower lobe atelectasis and a small loculated right pleural effusion again noted.  Cholelithiasis without CT evidence of acute cholecystitis.  Prostate enlargement.  Aortic atherosclerosis.    # Treatment:  06/21/2017 Cycle 1 RCHOP /neulasta . Dexamethasone  20mg  on Day 1,  Prednisone  80 mg daily Day 2-5. 07/12/2017 Cycle 2 RCHOP frederich .Dexamethasone  20mg  on Day 1, Prednisone  80 mg daily Day 2-5 08/03/2017 Cycle 3 RCHOP/onpro.Dexamethasone  20mg  on Day 1, Prednisone  80 mg daily Day 2-5  08/24/2017 Cycle 4 RCHOP/onpro.Dexamethasone  20mg  on Day 1, Prednisone  80 mg daily Day 2-5  09/14/2017 Cycle 5 RCHOP/onpro.Dexamethasone  20mg  on Day 1, Prednisone  80 mg daily Day 2-5  10/05/2017 Cycle 6 RCHOP/onpro.Dexamethasone  20mg  on Day 1,  Prednisone  80 mg daily Day 2-5 # Chronic back pain, previous MRI showed diffuse lumbar spine spondylosis.  INTERVAL HISTORY 78 y.o. male with oncology history listed as above reviewed by me today presents for follow-up for diffuse large B-cell lymphoma. Denies weight loss, fever, chills, fatigue, night sweats.  He has no new complaints. He drinks alcohol  daily.  Chronic back pain. Patient continues to have balancing issue, recently worse.  He takes B12 oral supplementation, on and off.   Review of Systems  Constitutional:  Negative for appetite change, chills, fatigue, fever and unexpected weight change.  HENT:   Negative for hearing loss and voice change.   Eyes:  Negative for eye problems and icterus.  Respiratory:  Negative for chest tightness, cough and shortness of breath.   Cardiovascular:  Negative for chest pain and leg swelling.  Gastrointestinal:  Negative for abdominal distention and abdominal pain.  Endocrine: Negative for hot flashes.  Genitourinary:  Negative for difficulty urinating, dysuria and frequency.   Musculoskeletal:  Positive for back pain and gait problem. Negative for arthralgias.  Skin:  Negative for itching and rash.  Neurological:  Positive for gait problem. Negative for light-headedness and numbness.  Hematological:  Negative for adenopathy. Does not bruise/bleed easily.  Psychiatric/Behavioral:  Negative for confusion.      MEDICAL HISTORY:  Past Medical History:  Diagnosis Date   CHF (congestive heart failure) (HCC)    COPD (chronic obstructive pulmonary disease) (HCC)  a. Quit smoking ~ 2018.   Diastolic dysfunction    a. 09/2017 Echo: EF 50-55%, mild RM, PASP .   Gout    Hard of hearing    Hiatal hernia    Hypertension    Non Hodgkin's lymphoma (HCC) 04/2017   B-cell, chemo tx's and in remission in 2019   Urinary incontinence    frequency    SURGICAL HISTORY: Past Surgical History:  Procedure Laterality Date   CYSTOSCOPY W/  RETROGRADES Left 08/19/2017   Procedure: CYSTOSCOPY WITH RETROGRADE PYELOGRAM;  Surgeon: Twylla Glendia BROCKS, MD;  Location: [facility];  Service: Urology;  Laterality: Left;   CYSTOSCOPY W/ URETERAL STENT REMOVAL Left 08/19/2017   Procedure: CYSTOSCOPY WITH STENT REMOVAL;  Surgeon: Twylla Glendia BROCKS, MD;  Location: [facility];  Service: Urology;  Laterality: Left;   CYSTOSCOPY WITH BIOPSY Left 05/13/2017   Procedure: CYSTOSCOPY WITH URETERAL BIOPSY;  Surgeon: Chauncey Redell Agent, MD;  Location: [facility];  Service: Urology;  Laterality: Left;   CYSTOSCOPY WITH STENT PLACEMENT Left 05/13/2017   Procedure: CYSTOSCOPY WITH STENT PLACEMENT;  Surgeon: Chauncey Redell Agent, MD;  Location: [facility];  Service: Urology;  Laterality: Left;   LEFT HEART CATH AND CORONARY ANGIOGRAPHY N/A 04/07/2022   Procedure: LEFT HEART CATH AND CORONARY ANGIOGRAPHY;  Surgeon: Darron Deatrice LABOR, MD;  Location: [facility];  Service: Cardiovascular;  Laterality: N/A;   PORTA CATH INSERTION N/A 06/14/2017   Procedure: PORTA CATH INSERTION;  Surgeon: Marea Selinda RAMAN, MD;  Location: [facility];  Service: Cardiovascular;  Laterality: N/A;   PORTA CATH REMOVAL N/A 02/25/2020   Procedure: PORTA CATH REMOVAL;  Surgeon: Marea Selinda RAMAN, MD;  Location: [facility];  Service: Cardiovascular;  Laterality: N/A;   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   right knne surgery     needle went through knee   TONSILLECTOMY     URETEROSCOPY Left 05/13/2017   Procedure: URETEROSCOPY;  Surgeon: Chauncey Redell Agent, MD;  Location: [facility];  Service: Urology;  Laterality: Left;   XI ROBOTIC ASSISTED VENTRAL HERNIA N/A 05/18/2019   Procedure: XI ROBOTIC ASSISTED UMBILICAL HERNIA;  Surgeon: Tye Millet, DO;  Location: [facility];  Service: General;  Laterality: N/A;    SOCIAL HISTORY: Social History   Socioeconomic History   Marital status: Legally Separated    Spouse name: Not on file   Number of children: Not on file   Years of education:  Not on file   Highest education level: Not on file  Occupational History   Not on file  Tobacco Use   Smoking status: Former    Current packs/day: 0.00    Average packs/day: 0.5 packs/day for 50.0 years (25.0 ttl pk-yrs)    Types: Cigarettes    Start date: 12/13/1966    Quit date: 12/12/2016    Years since quitting: 6.7   Smokeless tobacco: Never  Vaping Use   Vaping status: Never Used  Substance and Sexual Activity   Alcohol  use: Yes    Alcohol /week: 2.0 standard drinks of alcohol     Types: 2 Shots of liquor per week    Comment: vodka and cranberry juice-one drink daily (10 oz glass)   Drug use: No   Sexual activity: Not on file  Other Topics Concern   Not on file  Social History Narrative   Lives locally in apartment.  Divorced but he and his ex-wife are close friends and they live in the same apt complex.  He does not routinely exercise.   Social Drivers of Corporate Investment Banker Strain: Low Risk  (07/12/2023)   Received from [facility]   Overall Financial Resource Strain (CARDIA)    Difficulty of Paying Living Expenses: Not hard at all  Food Insecurity: No Food Insecurity (07/12/2023)   Received from [facility]   Hunger Vital Sign    Worried About Running Out of Food in the Last Year: Never true    Ran Out of Food in the Last Year: Never true  Transportation Needs: No Transportation Needs (07/12/2023)   Received from [facility] - Transportation    In the past 12 months, has lack of transportation kept you from medical appointments or from getting medications?: No    Lack of Transportation (Non-Medical): No  Physical Activity: Unknown (03/18/2022)   Exercise Vital Sign    Days of Exercise per Week: 0 days    Minutes of Exercise per Session: Not on file  Stress: No Stress Concern Present (03/18/2022)   [employer] of Occupational Health - Occupational Stress Questionnaire    Feeling of Stress : Not  at all  Social Connections: Socially Isolated (03/18/2022)   Social Connection and Isolation Panel [NHANES]    Frequency of Communication with Friends and Family: More than three times a week    Frequency of Social Gatherings with Friends and Family: More than three times a week    Attends Religious Services: Never    Database Administrator or Organizations: No    Attends Banker Meetings: Never    Marital Status: Separated  Intimate Partner Violence: Not At Risk (03/18/2022)   Humiliation, Afraid, Rape, and Kick questionnaire    Fear of Current or Ex-Partner: No    Emotionally Abused: No    Physically Abused: No    Sexually Abused: No    FAMILY HISTORY: Family History  Problem Relation Age of Onset   Pancreatic cancer Mother        died @ 24   Aneurysm Mother    Heart attack Mother    Lung cancer Father    Lymphoma Sister    Prostate cancer Neg Hx    Kidney cancer Neg Hx    Bladder Cancer Neg Hx     ALLERGIES:  is allergic to amlodipine .  MEDICATIONS:  Current Outpatient Medications  Medication Sig Dispense Refill   aspirin  EC 81 MG tablet Take 81 mg by mouth daily.     cholecalciferol (VITAMIN D3) 25 MCG (1000 UNIT) tablet Take 1,000 Units by mouth 3 (three) times a week.     doxazosin  (CARDURA ) 4 MG tablet Take 1 tablet (4 mg total) by mouth 2 (two) times daily. 180 tablet 3   ezetimibe  (ZETIA ) 10 MG tablet Take 1 tablet (10 mg total) by mouth daily. 90 tablet 3   furosemide  (LASIX ) 20 MG tablet Take 20 mg by mouth 3 (three) times a week.     gabapentin  (NEURONTIN ) 300 MG capsule Take 1 capsule (300 mg) by mouth at bedtime 90 capsule 1   hydrALAZINE  (APRESOLINE ) 100 MG tablet Take 100 mg by mouth 2 (two) times daily.     losartan  (COZAAR ) 100 MG tablet Take 1 tablet (100 mg total) by mouth daily. 30 tablet 1   meloxicam (MOBIC) 7.5 MG tablet Take 7.5 mg by mouth daily as needed.     nitroGLYCERIN  (NITROSTAT ) 0.4 MG SL tablet Place 1 tablet (0.4  mg total)  under the tongue every 5 (five) minutes as needed for chest pain. 25 tablet 1   potassium chloride  SA (KLOR-CON  M) 20 MEQ tablet Take 1 tablet (20 mEq total) by mouth daily. Monday, Wednesday and Friday. 30 tablet 1   pravastatin  (PRAVACHOL ) 40 MG tablet TAKE 1 TABLET(40 MG) BY MOUTH AT BEDTIME 90 tablet 3   spironolactone  (ALDACTONE ) 25 MG tablet Take 1 tablet (25 mg total) by mouth daily. 30 tablet 1   vitamin B-12 (CYANOCOBALAMIN ) 100 MCG tablet Take 100 mcg by mouth 3 (three) times a week.     zolpidem  (AMBIEN ) 10 MG tablet Take 10 mg by mouth at bedtime.     Current Facility-Administered Medications  Medication Dose Route Frequency Provider Last Rate Last Admin   sodium chloride  flush (NS) 0.9 % injection 3 mL  3 mL Intravenous Q12H Gollan, Timothy J, MD          .  PHYSICAL EXAMINATION: ECOG PERFORMANCE STATUS: 1 - Symptomatic but completely ambulatory Vitals:   09/14/23 1341  BP: 138/69  Pulse: 69  Resp: 18  Temp: (!) 96.7 F (35.9 C)  SpO2: 98%   Filed Weights   09/14/23 1341  Weight: 229 lb 1.6 oz (103.9 kg)    Physical Exam Constitutional:      General: He is not in acute distress.    Appearance: He is not diaphoretic.  HENT:     Head: Normocephalic.  Eyes:     General: No scleral icterus. Cardiovascular:     Rate and Rhythm: Normal rate.     Heart sounds: No murmur heard. Pulmonary:     Effort: Pulmonary effort is normal. No respiratory distress.     Breath sounds: No wheezing.  Abdominal:     General: There is no distension.     Palpations: Abdomen is soft.     Tenderness: There is no abdominal tenderness.  Musculoskeletal:        General: Normal range of motion.     Cervical back: Normal range of motion.  Lymphadenopathy:     Cervical: No cervical adenopathy.  Skin:    General: Skin is warm and dry.     Findings: No erythema.  Neurological:     Mental Status: He is alert and oriented to person, place, and time. Mental status is at baseline.      Cranial Nerves: No cranial nerve deficit.     Motor: No abnormal muscle tone.  Psychiatric:        Mood and Affect: Mood and affect normal.     LABORATORY DATA:  I have reviewed the data as listed    Latest Ref Rng & Units 09/14/2023    1:25 PM 08/30/2022   10:30 AM 03/31/2022   12:42 PM  CBC  WBC 4.0 - 10.5 K/uL 6.2  5.9  5.0   Hemoglobin 13.0 - 17.0 g/dL 86.8  87.4  86.8   Hematocrit 39.0 - 52.0 % 38.1  36.4  39.6   Platelets 150 - 400 K/uL 184  163  181        Latest Ref Rng & Units 09/14/2023    1:25 PM 08/30/2022   10:30 AM 03/31/2022   12:42 PM  CMP  Glucose 70 - 99 mg/dL 895  896  896   BUN 8 - 23 mg/dL 19  20  14    Creatinine 0.61 - 1.24 mg/dL 8.97  8.96  8.94   Sodium 135 - 145 mmol/L 138  139  141   Potassium 3.5 - 5.1 mmol/L 3.7  3.9  4.2   Chloride 98 - 111 mmol/L 105  107  108   CO2 22 - 32 mmol/L 25  26  26    Calcium 8.9 - 10.3 mg/dL 9.0  8.8  9.5   Total Protein 6.5 - 8.1 g/dL 7.1  6.9    Total Bilirubin 0.0 - 1.2 mg/dL 0.7  1.0    Alkaline Phos 38 - 126 U/L 81  73    AST 15 - 41 U/L 19  15    ALT 0 - 44 U/L 18  10     RADIOGRAPHIC STUDIES: I have personally reviewed the radiological images as listed and agreed with the findings in the report. No results found.

## 2023-09-14 NOTE — Assessment & Plan Note (Signed)
 Recommend patient to continue take  B12 supplementation.  B12 deficiency may further exacerbate his chronic gait problem.

## 2023-09-14 NOTE — Assessment & Plan Note (Signed)
Patient takes gabapentin as needed

## 2023-09-14 NOTE — Assessment & Plan Note (Addendum)
 Labs reviewed and discussed with patient. He is doing well clinically. 6 years after he finishes treatments. Patient prefers to continue follow-up with oncology annually for clinic surveillance.

## 2023-09-14 NOTE — Assessment & Plan Note (Signed)
Alcohol cessation recommendation was discussed with patient.

## 2023-10-07 ENCOUNTER — Other Ambulatory Visit: Payer: Self-pay

## 2023-10-08 MED ORDER — GABAPENTIN 300 MG PO CAPS: ORAL_CAPSULE | ORAL | 1 refills | Status: DC

## 2023-11-22 DIAGNOSIS — I10 Essential (primary) hypertension: Secondary | ICD-10-CM | POA: Diagnosis not present

## 2023-11-22 DIAGNOSIS — I5022 Chronic systolic (congestive) heart failure: Secondary | ICD-10-CM | POA: Diagnosis not present

## 2023-11-22 DIAGNOSIS — I700 Atherosclerosis of aorta: Secondary | ICD-10-CM | POA: Diagnosis not present

## 2023-11-22 DIAGNOSIS — M1990 Unspecified osteoarthritis, unspecified site: Secondary | ICD-10-CM | POA: Diagnosis not present

## 2023-12-05 DIAGNOSIS — C83398 Diffuse large b-cell lymphoma of other extranodal and solid organ sites: Secondary | ICD-10-CM | POA: Diagnosis not present

## 2023-12-05 DIAGNOSIS — I10 Essential (primary) hypertension: Secondary | ICD-10-CM | POA: Diagnosis not present

## 2023-12-05 DIAGNOSIS — M1990 Unspecified osteoarthritis, unspecified site: Secondary | ICD-10-CM | POA: Diagnosis not present

## 2023-12-05 DIAGNOSIS — Z125 Encounter for screening for malignant neoplasm of prostate: Secondary | ICD-10-CM | POA: Diagnosis not present

## 2023-12-05 DIAGNOSIS — E538 Deficiency of other specified B group vitamins: Secondary | ICD-10-CM | POA: Diagnosis not present

## 2023-12-05 DIAGNOSIS — H9193 Unspecified hearing loss, bilateral: Secondary | ICD-10-CM | POA: Diagnosis not present

## 2023-12-05 DIAGNOSIS — I5032 Chronic diastolic (congestive) heart failure: Secondary | ICD-10-CM | POA: Diagnosis not present

## 2023-12-05 DIAGNOSIS — D649 Anemia, unspecified: Secondary | ICD-10-CM | POA: Diagnosis not present

## 2023-12-05 DIAGNOSIS — G629 Polyneuropathy, unspecified: Secondary | ICD-10-CM | POA: Diagnosis not present

## 2023-12-12 DIAGNOSIS — I5030 Unspecified diastolic (congestive) heart failure: Secondary | ICD-10-CM | POA: Diagnosis not present

## 2023-12-12 DIAGNOSIS — M545 Low back pain, unspecified: Secondary | ICD-10-CM | POA: Diagnosis not present

## 2023-12-12 DIAGNOSIS — Z87891 Personal history of nicotine dependence: Secondary | ICD-10-CM | POA: Diagnosis not present

## 2023-12-12 DIAGNOSIS — H9190 Unspecified hearing loss, unspecified ear: Secondary | ICD-10-CM | POA: Diagnosis not present

## 2023-12-12 DIAGNOSIS — I110 Hypertensive heart disease with heart failure: Secondary | ICD-10-CM | POA: Diagnosis not present

## 2023-12-12 DIAGNOSIS — G4700 Insomnia, unspecified: Secondary | ICD-10-CM | POA: Diagnosis not present

## 2023-12-12 DIAGNOSIS — C8330 Diffuse large B-cell lymphoma, unspecified site: Secondary | ICD-10-CM | POA: Diagnosis not present

## 2023-12-12 DIAGNOSIS — Z1331 Encounter for screening for depression: Secondary | ICD-10-CM | POA: Diagnosis not present

## 2023-12-12 DIAGNOSIS — Z0000 Encounter for general adult medical examination without abnormal findings: Secondary | ICD-10-CM | POA: Diagnosis not present

## 2024-04-16 ENCOUNTER — Other Ambulatory Visit: Payer: Self-pay | Admitting: Oncology

## 2024-05-07 ENCOUNTER — Other Ambulatory Visit: Payer: Self-pay | Admitting: Cardiovascular Disease

## 2024-06-07 ENCOUNTER — Other Ambulatory Visit: Payer: Self-pay | Admitting: Cardiovascular Disease

## 2024-06-11 DIAGNOSIS — Z6831 Body mass index (BMI) 31.0-31.9, adult: Secondary | ICD-10-CM | POA: Diagnosis not present

## 2024-06-11 DIAGNOSIS — I5032 Chronic diastolic (congestive) heart failure: Secondary | ICD-10-CM | POA: Diagnosis not present

## 2024-06-11 DIAGNOSIS — G4700 Insomnia, unspecified: Secondary | ICD-10-CM | POA: Diagnosis not present

## 2024-06-11 DIAGNOSIS — I10 Essential (primary) hypertension: Secondary | ICD-10-CM | POA: Diagnosis not present

## 2024-06-11 DIAGNOSIS — M545 Low back pain, unspecified: Secondary | ICD-10-CM | POA: Diagnosis not present

## 2024-06-11 DIAGNOSIS — H9193 Unspecified hearing loss, bilateral: Secondary | ICD-10-CM | POA: Diagnosis not present

## 2024-06-11 DIAGNOSIS — C83398 Diffuse large b-cell lymphoma of other extranodal and solid organ sites: Secondary | ICD-10-CM | POA: Diagnosis not present

## 2024-06-11 DIAGNOSIS — G8929 Other chronic pain: Secondary | ICD-10-CM | POA: Diagnosis not present

## 2024-06-11 DIAGNOSIS — I890 Lymphedema, not elsewhere classified: Secondary | ICD-10-CM | POA: Diagnosis not present

## 2024-06-18 ENCOUNTER — Other Ambulatory Visit: Payer: Self-pay | Admitting: Internal Medicine

## 2024-06-18 ENCOUNTER — Ambulatory Visit
Admission: RE | Admit: 2024-06-18 | Discharge: 2024-06-18 | Disposition: A | Discharge status: Home / Self Care | Source: Ambulatory Visit | Attending: Internal Medicine | Admitting: Internal Medicine

## 2024-06-18 DIAGNOSIS — Z1331 Encounter for screening for depression: Secondary | ICD-10-CM | POA: Diagnosis not present

## 2024-06-18 DIAGNOSIS — I5030 Unspecified diastolic (congestive) heart failure: Secondary | ICD-10-CM | POA: Diagnosis not present

## 2024-06-18 DIAGNOSIS — Z0000 Encounter for general adult medical examination without abnormal findings: Secondary | ICD-10-CM | POA: Diagnosis not present

## 2024-06-18 DIAGNOSIS — Z23 Encounter for immunization: Secondary | ICD-10-CM | POA: Diagnosis not present

## 2024-06-18 DIAGNOSIS — C8330 Diffuse large B-cell lymphoma, unspecified site: Secondary | ICD-10-CM | POA: Diagnosis not present

## 2024-06-18 DIAGNOSIS — M7989 Other specified soft tissue disorders: Secondary | ICD-10-CM | POA: Insufficient documentation

## 2024-06-18 DIAGNOSIS — I890 Lymphedema, not elsewhere classified: Secondary | ICD-10-CM | POA: Diagnosis not present

## 2024-06-18 DIAGNOSIS — M1711 Unilateral primary osteoarthritis, right knee: Secondary | ICD-10-CM | POA: Diagnosis not present

## 2024-06-18 DIAGNOSIS — I110 Hypertensive heart disease with heart failure: Secondary | ICD-10-CM | POA: Diagnosis not present

## 2024-07-09 NOTE — Progress Notes (Unsigned)
 Cardiology Office Note  Date:  07/10/2024   ID:  John Gallegos, DOB [birth year], MRN [id number]  PCP:  Sadie Manna, MD   Chief Complaint  Patient presents with   12 month follow up     Patient c/o shortness of breath with little to no activity and bilateral LE edema with more on the right leg.  Patient would like a referral to Vein and Vascular for cramping & leg pain with walking.     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 Significant native three-vessel coronary disease on CT scan Admitted 11/2021 with hypertensive urgency and acute diastolic heart failure. Treated with IV lasix . Troponin was elevated.  Echo showed preserved EF.  BB was held for bradcyardia.  Presents for follow-up of his chest pain, angina, follow-up of his cardiac CTA, HTN  LOV 10/24 On discussion today reports he has developed Severe leg swelling 2-3 months right >left Takes lasix  2-3 x a week Denies excessive fluid intake Reports leg is painful on walking  Seen by primary care LE venous doppler, no DVT noted  Unable to put compression hose on  Does not eat out Denies chest pain concerning for angina Cooks at home  Denies significant shortness of breath but activity is limited  Lab work reviewed A1c 5.3 Total cholesterol 148 LDL 81 Normal CMP  EKG personally reviewed by myself on todays visit EKG Interpretation Date/Time:  Tuesday July 10 2024 09:30:13 EST Ventricular Rate:  79 PR Interval:  178 QRS Duration:  78 QT Interval:  418 QTC Calculation: 479 R Axis:   -8  Text Interpretation: Normal sinus rhythm Normal ECG When compared with ECG of 17-May-2023 14:30, Sinus rhythm has replaced Ectopic atrial rhythm Confirmed  by Perla Lye [phone]) on 07/10/2024 9:37:27 AM   Other past medical history reviewed Cardiac catheterization April 07, 2022  heavily calcified coronary arteries with moderate three-vessel disease Normal LV function moderately elevated left ventricular end-diastolic pressure  Leg pain, neuropathy No regular exercise program  Lives in appt No event hobbies   Other past medical history reviewed Cardiac CTA was ordered, performed last week 03/25/22 Very high coronary calcium score of 5071. This was 97th percentile for age and sex matched control. 2. Normal coronary origin with right dominance. 3. Heavily calcified 3 vessel coronary arteries. 4. Severe stenosis (>70%) in the proximal RCA and LAD. 5. Moderate stenosis (50-69%) in the proximal LCx. 6. CAD-RADS 4 Severe stenosis. (70-99% or > 50% left main).   Gastroenteritis-secondary to COVID-19 infection. 09/2020 Dehydrated, CR 1.77 Weakness Hypokalemia Treated with steroids Losartan  HCTZ was held at d/c  History of lymphoma, has finished treatment Has neuropathy  Echo 06/07/2017 Normal EF 60%  previous imaging studies reviewed with him in detail  PET scan 05/18/2017  IMPRESSION: 1. 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  PSH:    Past Surgical History:  Procedure Laterality Date  CYSTOSCOPY W/ RETROGRADES Left 08/19/2017   Procedure: CYSTOSCOPY WITH RETROGRADE PYELOGRAM;  Surgeon: Twylla Glendia BROCKS, MD;  Location: [facility];  Service: Urology;  Laterality: Left;   CYSTOSCOPY W/ URETERAL  STENT REMOVAL Left 08/19/2017   Procedure: CYSTOSCOPY WITH STENT REMOVAL;  Surgeon: Twylla Glendia BROCKS, MD;  Location: [facility];  Service: Urology;  Laterality: Left;   CYSTOSCOPY WITH BIOPSY Left 05/13/2017   Procedure: CYSTOSCOPY WITH URETERAL BIOPSY;  Surgeon: Chauncey Redell Agent, MD;  Location: [facility];  Service: Urology;  Laterality: Left;   CYSTOSCOPY WITH STENT PLACEMENT Left 05/13/2017   Procedure: CYSTOSCOPY WITH STENT PLACEMENT;  Surgeon: Chauncey Redell Agent, MD;  Location: [facility];  Service: Urology;  Laterality: Left;   LEFT HEART CATH AND CORONARY ANGIOGRAPHY N/A 04/07/2022   Procedure: LEFT HEART CATH AND CORONARY ANGIOGRAPHY;  Surgeon: Darron Deatrice LABOR, MD;  [facility];  Service: Cardiovascular;  Laterality: N/A;   PORTA CATH INSERTION N/A 06/14/2017   Procedure: PORTA CATH INSERTION;  Surgeon: Marea Selinda RAMAN, MD;  Location: [facility];  Service: Cardiovascular;  Laterality: N/A;   PORTA CATH REMOVAL N/A 02/25/2020   Procedure: PORTA CATH REMOVAL;  Surgeon: Marea Selinda RAMAN, MD;  Location: [facility];  Service: Cardiovascular;  Laterality: N/A;   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     cholecalciferol (VITAMIN D3) 25 MCG (1000 UNIT) tablet Take 1,000 Units by mouth 3 (three) times a week.     doxazosin  (CARDURA ) 4 MG tablet TAKE 1 TABLET(4 MG) BY MOUTH TWICE DAILY 180 tablet 0   ezetimibe  (ZETIA ) 10 MG tablet TAKE 1 TABLET(10 MG) BY  MOUTH DAILY 30 tablet 0   furosemide  (LASIX ) 20 MG tablet Take 20 mg by mouth 3 (three) times a week.     gabapentin  (NEURONTIN ) 300 MG capsule TAKE 1 CAPSULE(300 MG) BY MOUTH AT BEDTIME 90 capsule 1   hydrALAZINE  (APRESOLINE ) 100 MG tablet Take 100 mg by mouth 2 (two) times daily.     losartan  (COZAAR ) 100 MG tablet Take 1 tablet (100 mg total) by mouth daily. 30 tablet 1   meloxicam (MOBIC) 7.5 MG tablet Take 7.5 mg by mouth daily as needed.     nitroGLYCERIN  (NITROSTAT ) 0.4 MG SL tablet Place 1 tablet (0.4 mg total) under the tongue every 5 (five) minutes as needed for chest pain. 25 tablet 1   potassium chloride  SA (KLOR-CON  M) 20 MEQ tablet Take 1 tablet (20 mEq total) by mouth daily. Monday, Wednesday and Friday. 30 tablet 1   pravastatin  (PRAVACHOL ) 40 MG tablet TAKE 1 TABLET(40 MG) BY MOUTH AT BEDTIME 90 tablet 3   spironolactone  (ALDACTONE ) 25 MG tablet Take 1 tablet (25 mg total) by mouth daily. 30 tablet 1   vitamin B-12 (CYANOCOBALAMIN ) 100 MCG tablet Take 100 mcg by mouth 3 (three) times a week.     zolpidem  (AMBIEN ) 10 MG tablet  Take 10 mg by mouth at bedtime.     Current Facility-Administered Medications  Medication Dose Route Frequency Provider Last Rate Last Admin   sodium chloride  flush (NS) 0.9 % injection 3 mL  3 mL Intravenous Q12H Nicanor Mendolia J, MD        Allergies:   Amlodipine    Social History:  The patient  reports that he quit smoking about 7 years ago. His smoking use included cigarettes. He started smoking about 57 years ago. He has a 25 pack-year smoking history. He has never used smokeless tobacco. He reports current alcohol  use of about 2.0 standard drinks of alcohol  per week. He reports that he does not use drugs.   Family History:   family history includes Aneurysm in his mother; Heart attack in his mother; Lung cancer in his father; Lymphoma in his sister; Pancreatic cancer in his mother.    Review of Systems: Review of Systems  Constitutional:  Negative.   HENT: Negative.    Respiratory: Negative.    Cardiovascular:  Positive for leg swelling.  Gastrointestinal: Negative.   Musculoskeletal:  Positive for joint pain.       Nerve pain  Neurological: Negative.   Psychiatric/Behavioral: Negative.    All other systems reviewed and are negative.   PHYSICAL EXAM: VS:  BP 128/78 (BP Location: Left Arm, Patient Position: Sitting, Cuff Size: Normal)   Pulse 79   Ht 6' 1 (1.854 m)   Wt 233 lb 4 oz (105.8 kg)   SpO2 96%   BMI 30.77 kg/m  , BMI Body mass index is 30.77 kg/m. Constitutional:  oriented to person, place, and time. No distress.  HENT:  Head: Grossly normal Eyes:  no discharge. No scleral icterus.  Neck: No JVD, no carotid bruits  Cardiovascular: Regular rate and rhythm, no murmurs appreciated, 2+ swelling right lower extremity into the thigh 1+ on the left Pulmonary/Chest: Clear to auscultation bilaterally, no wheezes or rails Abdominal: Soft.  no distension.  no tenderness.  Musculoskeletal: Normal range of motion Neurological:  normal muscle tone. Coordination normal. No atrophy Skin: Skin warm and dry Psychiatric: normal affect, pleasant  Recent Labs: 09/14/2023: ALT 18; BUN 19; Creatinine 1.02; Hemoglobin 13.1; Platelets 184; Potassium 3.7; Sodium 138    Lipid Panel No results found for: CHOL, HDL, LDLCALC, TRIG    Wt Readings from Last 3 Encounters:  07/10/24 233 lb 4 oz (105.8 kg)  09/14/23 229 lb 1.6 oz (103.9 kg)  05/17/23 228 lb (103.4 kg)     ASSESSMENT AND PLAN:  Coronary artery disease with stable angina Severe three-vessel coronary disease on cardiac CTA most notably in the proximal RCA and LAD Cardiac catheterization with moderate three-vessel coronary disease, no intervention performed Currently with no symptoms of angina. No further workup at this time. Continue current medication regimen.  Diffuse large B-cell lymphoma of extranodal site excluding spleen and other solid organs  (HCC) Completed RCHOP Followed by oncology, stable  Mixed hyperlipidemia Continue pravastatin , Zetia  10 mg daily  Lab work per primary care  Essential hypertension Blood pressure is well controlled on today's visit. No changes made to the medications.  Smoker Stopped smoking >3 years  Copd Long smoking history, breathing stable No recent COPD exacerbation  Chronic leg pain Neuropathy, on gabapentin  nightly Previously seen by Dr. Marea  Leg swelling Worsening leg swelling on the right compared to the left past 2 to 3 months Previously seen by Dr.Dew, diagnosed with lymphedema, diagnosed with stage II lymphedema from chronic scarring  and lymphatic channels It was recommended he would benefit from lymphedema pump, at the time was wearing compression stockings He is again requesting referral to vascular for further evaluation  Orders Placed This Encounter  Procedures   EKG 12-Lead     Signed, Velinda Lunger, M.D., Ph.D. 07/10/2024  [facility] [place], [place] [phone]

## 2024-07-10 ENCOUNTER — Ambulatory Visit: Attending: Cardiovascular Disease | Admitting: Cardiovascular Disease

## 2024-07-10 ENCOUNTER — Encounter: Payer: Self-pay | Admitting: Cardiovascular Disease

## 2024-07-10 VITALS — BP 128/78 | HR 79 | Ht 73.0 in | Wt 233.2 lb

## 2024-07-10 DIAGNOSIS — C8518 Unspecified B-cell lymphoma, lymph nodes of multiple sites: Secondary | ICD-10-CM

## 2024-07-10 DIAGNOSIS — I10 Essential (primary) hypertension: Secondary | ICD-10-CM | POA: Diagnosis not present

## 2024-07-10 DIAGNOSIS — Z79899 Other long term (current) drug therapy: Secondary | ICD-10-CM | POA: Diagnosis not present

## 2024-07-10 DIAGNOSIS — R001 Bradycardia, unspecified: Secondary | ICD-10-CM | POA: Diagnosis not present

## 2024-07-10 DIAGNOSIS — Z87891 Personal history of nicotine dependence: Secondary | ICD-10-CM

## 2024-07-10 DIAGNOSIS — E782 Mixed hyperlipidemia: Secondary | ICD-10-CM | POA: Diagnosis not present

## 2024-07-10 DIAGNOSIS — M7989 Other specified soft tissue disorders: Secondary | ICD-10-CM

## 2024-07-10 DIAGNOSIS — C8300 Small cell B-cell lymphoma, unspecified site: Secondary | ICD-10-CM

## 2024-07-10 DIAGNOSIS — I5032 Chronic diastolic (congestive) heart failure: Secondary | ICD-10-CM | POA: Diagnosis not present

## 2024-07-10 DIAGNOSIS — I25118 Atherosclerotic heart disease of native coronary artery with other forms of angina pectoris: Secondary | ICD-10-CM | POA: Diagnosis not present

## 2024-07-10 MED ORDER — POTASSIUM CHLORIDE CRYS ER 20 MEQ PO TBCR: 20.0000 meq | EXTENDED_RELEASE_TABLET | Freq: Every day | ORAL | 3 refills | Status: AC

## 2024-07-10 MED ORDER — FUROSEMIDE 20 MG PO TABS: 20.0000 mg | ORAL_TABLET | Freq: Every day | ORAL | 3 refills | Status: AC

## 2024-07-10 NOTE — Patient Instructions (Addendum)
 Compression hose if able to get on  Referral to vascular in [place] for venous insufficiency  Medication Instructions:   Please take lasix  daily with potassium daily  If you need a refill on your cardiac medications before your next appointment, please call your pharmacy.   Lab work: Your provider would like for you to return in 3 weeks  to have the following labs drawn: BMP.   Please go to [facility] [address] ([facility] [address], [place] [address] You do not need an appointment.  They are open from 8 am- 4:30 pm.  Lunch from 1:00 pm- 2:00 pm You will not need to be fasting.  Testing/Procedures: No new testing needed  Follow-Up: At [facility], you and your health needs are our priority.  As part of our continuing mission to provide you with exceptional heart care, we have created designated Provider Care Teams.  These Care Teams include your primary Cardiologist (physician) and Advanced Practice Providers (APPs -  Physician Assistants and Nurse Practitioners) who all work together to provide you with the care you need, when you need it.  You will need a follow up appointment in 6 months  Providers on your designated Care Team:   Lonni Meager, NP Bernardino Bring, PA-C Cadence [place], [place]  COVID-19 Vaccine Information can be found at: [email or web address] For questions related to vaccine distribution or appointments, please email [email or web address] or call [phone].

## 2024-07-15 ENCOUNTER — Other Ambulatory Visit: Payer: Self-pay | Admitting: Cardiovascular Disease

## 2024-08-11 ENCOUNTER — Other Ambulatory Visit: Payer: Self-pay | Admitting: Cardiovascular Disease

## 2024-08-13 ENCOUNTER — Ambulatory Visit: Admitting: Cardiovascular Disease

## 2024-08-14 ENCOUNTER — Encounter: Payer: Self-pay | Admitting: Emergency Medicine

## 2024-08-26 DIAGNOSIS — M79669 Pain in unspecified lower leg: Secondary | ICD-10-CM | POA: Insufficient documentation

## 2024-08-26 NOTE — Progress Notes (Unsigned)
 "                              MRN : [id number]  John Gallegos is a 79 y.o. (10/02/1945) male who presents with chief complaint of legs hurt and swell.  History of Present Illness:   The patient returns to the office for followup evaluation regarding leg swelling.  The swelling has persisted and the pain associated with swelling continues. There have not been any interval development of a ulcerations or wounds.  Since the previous visit the patient has been wearing graduated compression stockings and has noted little if any improvement in the lymphedema. The patient has been using compression routinely morning until night.  The patient also states elevation during the day and exercise is being done too.  Active Medications[1]  Past Medical History:  Diagnosis Date   CHF (congestive heart failure) (HCC)    COPD (chronic obstructive pulmonary disease) (HCC)    a. Quit smoking ~ 2018.   Diastolic dysfunction    a. 09/2017 Echo: EF 50-55%, mild RM, PASP .   Gout    Hard of hearing    Hiatal hernia    Hypertension    Non Hodgkin's lymphoma (HCC) 04/2017   B-cell, chemo tx's and in remission in 2019   Urinary incontinence    frequency    Past Surgical History:  Procedure Laterality Date   CYSTOSCOPY W/ RETROGRADES Left 08/19/2017   Procedure: CYSTOSCOPY WITH RETROGRADE PYELOGRAM;  Surgeon: Twylla Glendia BROCKS, MD;  Location: [facility];  Service: Urology;  Laterality: Left;   CYSTOSCOPY W/ URETERAL STENT REMOVAL Left 08/19/2017   Procedure: CYSTOSCOPY WITH STENT REMOVAL;  Surgeon: Twylla Glendia BROCKS, MD;  Location: [facility];  Service: Urology;  Laterality: Left;   CYSTOSCOPY WITH BIOPSY Left 05/13/2017   Procedure: CYSTOSCOPY WITH URETERAL BIOPSY;  Surgeon: Chauncey Redell Agent, MD;  Location: [facility];  Service: Urology;  Laterality: Left;   CYSTOSCOPY WITH STENT PLACEMENT Left 05/13/2017   Procedure: CYSTOSCOPY WITH STENT PLACEMENT;  Surgeon: Chauncey Redell Agent, MD;  Location:  [facility];  Service: Urology;  Laterality: Left;   LEFT HEART CATH AND CORONARY ANGIOGRAPHY N/A 04/07/2022   Procedure: LEFT HEART CATH AND CORONARY ANGIOGRAPHY;  Surgeon: Darron Deatrice LABOR, MD;  [facility];  Service: Cardiovascular;  Laterality: N/A;   PORTA CATH INSERTION N/A 06/14/2017   Procedure: PORTA CATH INSERTION;  Surgeon: Marea Selinda RAMAN, MD;  Location: [facility];  Service: Cardiovascular;  Laterality: N/A;   PORTA CATH REMOVAL N/A 02/25/2020   Procedure: PORTA CATH REMOVAL;  Surgeon: Marea Selinda RAMAN, MD;  Location: [facility];  Service: Cardiovascular;  Laterality: N/A;   pyleostenosis     64 weeks old   right knne surgery     needle went through knee   TONSILLECTOMY     URETEROSCOPY Left 05/13/2017   Procedure: URETEROSCOPY;  Surgeon: Chauncey Redell Agent, MD;  Location: [facility];  Service: Urology;  Laterality: Left;   XI ROBOTIC ASSISTED VENTRAL HERNIA N/A 05/18/2019   Procedure: XI ROBOTIC ASSISTED UMBILICAL HERNIA;  Surgeon: Tye Millet, DO;  Location: [facility];  Service: General;  Laterality: N/A;    Social History Social History[2]  Family History Family History  Problem Relation Age of Onset   Pancreatic cancer Mother        died @ 22   Aneurysm Mother    Heart attack Mother  Lung cancer Father    Lymphoma Sister    Prostate cancer Neg Hx    Kidney cancer Neg Hx    Bladder Cancer Neg Hx     Allergies[3]   REVIEW OF SYSTEMS (Negative unless checked)  Constitutional: [] Weight loss  [] Fever  [] Chills Cardiac: [] Chest pain   [] Chest pressure   [] Palpitations   [] Shortness of breath when laying flat   [] Shortness of breath with exertion. Vascular:  [] Pain in legs with walking   [x] Pain in legs with standing  [] History of DVT   [] Phlebitis   [x] Swelling in legs   [] Varicose veins   [] Non-healing ulcers Pulmonary:   [] Uses home oxygen   [] Productive cough   [] Hemoptysis   [] Wheeze  [] COPD   [] Asthma Neurologic:  [] Dizziness    [] Seizures   [] History of stroke   [] History of TIA  [] Aphasia   [] Vissual changes   [] Weakness or numbness in arm   [] Weakness or numbness in leg Musculoskeletal:   [] Joint swelling   [] Joint pain   [] Low back pain Hematologic:  [] Easy bruising  [] Easy bleeding   [] Hypercoagulable state   [] Anemic Gastrointestinal:  [] Diarrhea   [] Vomiting  [] Gastroesophageal reflux/heartburn   [] Difficulty swallowing. Genitourinary:  [] Chronic kidney disease   [] Difficult urination  [] Frequent urination   [] Blood in urine Skin:  [] Rashes   [] Ulcers  Psychological:  [] History of anxiety   []  History of major depression.  Physical Examination  There were no vitals filed for this visit. There is no height or weight on file to calculate BMI. Gen: WD/WN, NAD Head: [place]/AT, No temporalis wasting.  Ear/Nose/Throat: Hearing grossly intact, nares w/o erythema or drainage, pinna without lesions Eyes: PER, EOMI, sclera nonicteric.  Neck: Supple, no gross masses.  No JVD.  Pulmonary:  Good air movement, no audible wheezing, no use of accessory muscles.  Cardiac: RRR, precordium not hyperdynamic. Vascular:  scattered varicosities present bilaterally.  Mild venous stasis changes to the legs bilaterally.  3-4+ soft pitting edema, CEAP [id number]  Vessel Right Left  Radial Palpable Palpable  Gastrointestinal: soft, non-distended. No guarding/no peritoneal signs.  Musculoskeletal: M/S 5/5 throughout.  No deformity.  Neurologic: CN 2-12 intact. Pain and light touch intact in extremities.  Symmetrical.  Speech is fluent. Motor exam as listed above. Psychiatric: Judgment intact, Mood & affect appropriate for pt's clinical situation. Dermatologic: Venous rashes no ulcers noted.  No changes consistent with cellulitis. Lymph : No lichenification or skin changes of chronic lymphedema.  CBC Lab Results  Component Value Date   WBC 6.2 09/14/2023   HGB 13.1 09/14/2023   HCT 38.1 (L) 09/14/2023   MCV 89.0 09/14/2023   PLT 184  09/14/2023    BMET    Component Value Date/Time   NA 138 09/14/2023 1325   NA 133 (L) 09/18/2013 1128   K 3.7 09/14/2023 1325   K 3.1 (L) 09/18/2013 1128   CL 105 09/14/2023 1325   CL 98 09/18/2013 1128   CO2 25 09/14/2023 1325   CO2 29 09/18/2013 1128   GLUCOSE 104 (H) 09/14/2023 1325   GLUCOSE 97 09/18/2013 1128   BUN 19 09/14/2023 1325   BUN 16 04/05/2017 1514   BUN 15 09/18/2013 1128   CREATININE 1.02 09/14/2023 1325   CREATININE 1.25 09/18/2013 1128   CALCIUM 9.0 09/14/2023 1325   CALCIUM 9.1 09/18/2013 1128   GFRNONAA >60 09/14/2023 1325   GFRNONAA 59 (L) 09/18/2013 1128   GFRAA >60 02/18/2020 1216   GFRAA >60 09/18/2013 1128  CrCl cannot be calculated (Patient's most recent lab result is older than the maximum 21 days allowed.).  COAG Lab Results  Component Value Date   INR 0.96 06/13/2017   INR 0.96 06/01/2017    Radiology No results found.   Assessment/Plan There are no diagnoses linked to this encounter.   Cordella Shawl, MD  08/26/2024 1:58 PM      [1]  No outpatient medications have been marked as taking for the 08/27/24 encounter (Appointment) with Shawl, Cordella MATSU, MD.   Current Facility-Administered Medications for the 08/27/24 encounter (Appointment) with Shawl, Cordella MATSU, MD  Medication   sodium chloride  flush (NS) 0.9 % injection 3 mL  [2]  Social History Tobacco Use   Smoking status: Former    Current packs/day: 0.00    Average packs/day: 0.5 packs/day for 50.0 years (25.0 ttl pk-yrs)    Types: Cigarettes    Start date: 12/13/1966    Quit date: 12/12/2016    Years since quitting: 7.7   Smokeless tobacco: Never  Vaping Use   Vaping status: Never Used  Substance Use Topics   Alcohol  use: Yes    Alcohol /week: 2.0 standard drinks of alcohol     Types: 2 Shots of liquor per week    Comment: vodka and cranberry juice-one drink daily (10 oz glass)   Drug use: No  [3]  Allergies Allergen Reactions   Amlodipine  Swelling     Severe leg swelling   "

## 2024-08-27 ENCOUNTER — Ambulatory Visit (INDEPENDENT_AMBULATORY_CARE_PROVIDER_SITE_OTHER): Admitting: Vascular Surgery

## 2024-08-27 ENCOUNTER — Encounter (INDEPENDENT_AMBULATORY_CARE_PROVIDER_SITE_OTHER): Payer: Self-pay | Admitting: Vascular Surgery

## 2024-08-27 VITALS — BP 143/75 | HR 63 | Resp 17 | Ht 73.0 in | Wt 220.0 lb

## 2024-08-27 DIAGNOSIS — I10 Essential (primary) hypertension: Secondary | ICD-10-CM

## 2024-08-27 DIAGNOSIS — I890 Lymphedema, not elsewhere classified: Secondary | ICD-10-CM | POA: Diagnosis not present

## 2024-08-27 DIAGNOSIS — M7989 Other specified soft tissue disorders: Secondary | ICD-10-CM | POA: Diagnosis not present

## 2024-08-27 DIAGNOSIS — I25118 Atherosclerotic heart disease of native coronary artery with other forms of angina pectoris: Secondary | ICD-10-CM

## 2024-08-27 DIAGNOSIS — M79669 Pain in unspecified lower leg: Secondary | ICD-10-CM

## 2024-08-27 DIAGNOSIS — E782 Mixed hyperlipidemia: Secondary | ICD-10-CM | POA: Diagnosis not present

## 2024-09-07 ENCOUNTER — Telehealth: Payer: Self-pay | Admitting: Oncology

## 2024-09-07 NOTE — Telephone Encounter (Signed)
 With the coming weather and the full MD schedule, I called pt and lvm that if he wanted to r/s his 1 year appt (currently for 2/5) he can call [phone] to r/s or he can leave it as is and we will see him thurs.

## 2024-09-13 ENCOUNTER — Inpatient Hospital Stay: Payer: Medicare HMO

## 2024-09-13 ENCOUNTER — Inpatient Hospital Stay: Payer: Medicare HMO | Admitting: Oncology

## 2025-02-25 ENCOUNTER — Ambulatory Visit (INDEPENDENT_AMBULATORY_CARE_PROVIDER_SITE_OTHER): Admitting: Vascular Surgery
